# Patient Record
Sex: Male | Born: 1940
Health system: Southern US, Community
[De-identification: ages and names within clinical notes are randomized; demographics above are authoritative.]

## PROBLEM LIST (undated history)

## (undated) DIAGNOSIS — D759 Disease of blood and blood-forming organs, unspecified: Secondary | ICD-10-CM

## (undated) DIAGNOSIS — C801 Malignant (primary) neoplasm, unspecified: Secondary | ICD-10-CM

## (undated) DIAGNOSIS — D689 Coagulation defect, unspecified: Secondary | ICD-10-CM

## (undated) DIAGNOSIS — G709 Myoneural disorder, unspecified: Secondary | ICD-10-CM

## (undated) DIAGNOSIS — I10 Essential (primary) hypertension: Secondary | ICD-10-CM

## (undated) HISTORY — PX: SKIN BIOPSY: SHX1

---

## 2003-02-10 ENCOUNTER — Ambulatory Visit (HOSPITAL_COMMUNITY): Admission: RE | Admit: 2003-02-10 | Discharge: 2003-02-10 | Payer: Self-pay | Admitting: Family Medicine

## 2003-02-10 ENCOUNTER — Encounter: Payer: Self-pay | Admitting: Family Medicine

## 2006-01-10 ENCOUNTER — Ambulatory Visit (HOSPITAL_COMMUNITY): Admission: RE | Admit: 2006-01-10 | Discharge: 2006-01-10 | Payer: Self-pay | Admitting: Family Medicine

## 2006-01-10 ENCOUNTER — Encounter: Payer: Self-pay | Admitting: Vascular Surgery

## 2006-01-10 IMAGING — CR DG CHEST 2V
2 series · 2 of 2 positions shown · non-contrast
Comparison: Report of [DATE].

CLINICAL DATA: Preop chest x-ray/cough/hypertension. 
 CHEST - 2 VIEW:

[view not recorded (1 of 2)]
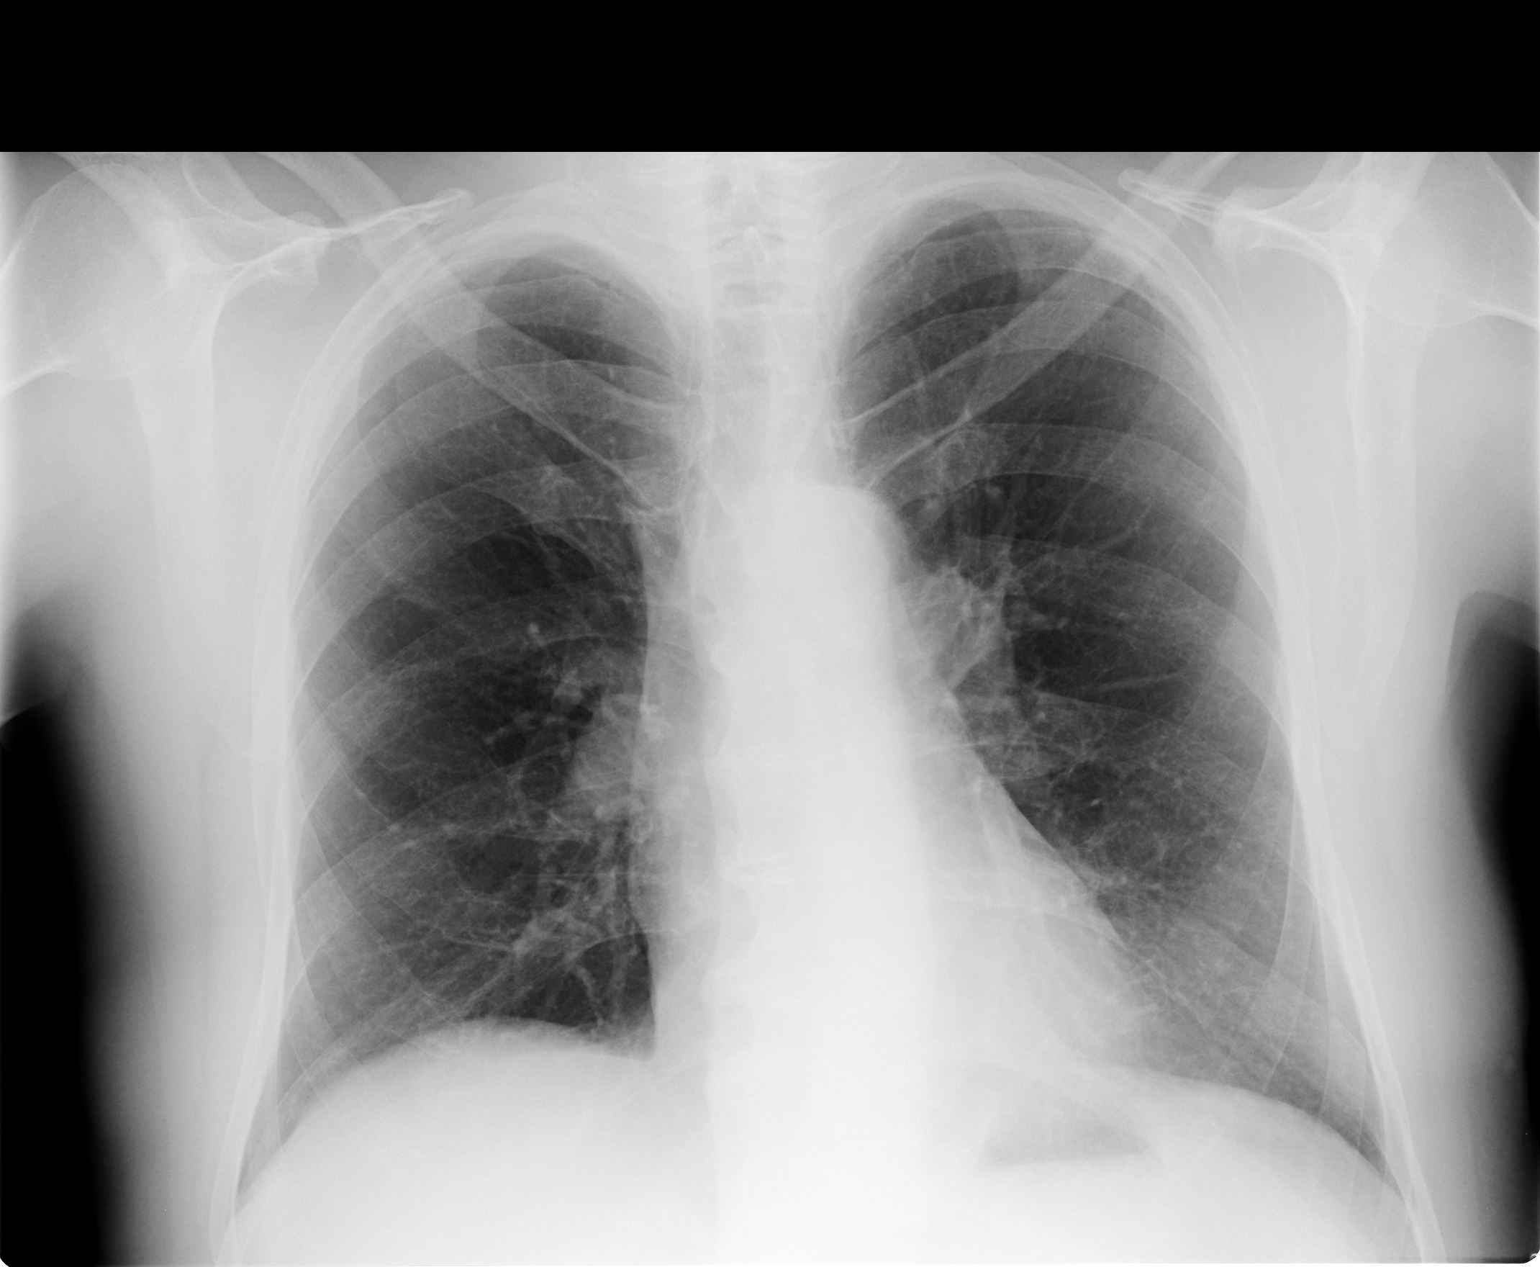

[view not recorded (2 of 2)]
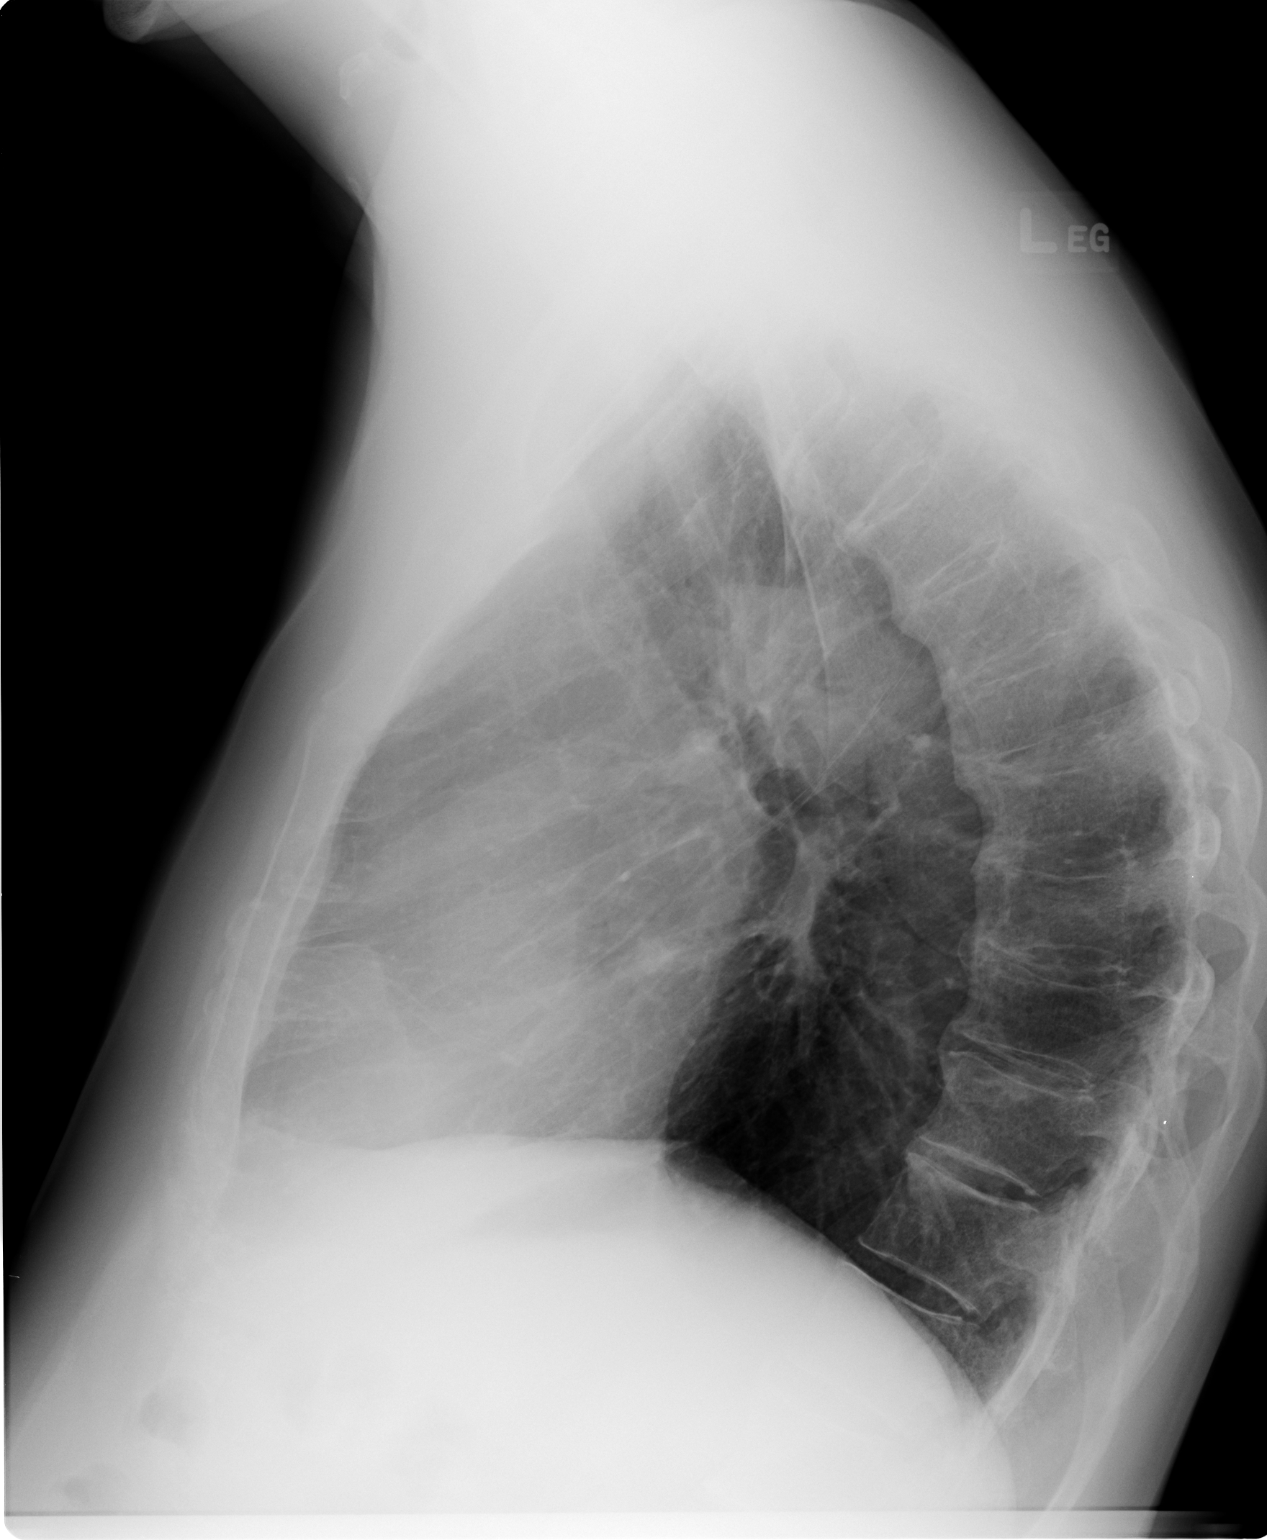

[2 of 2 positions shown; findings below may reference images not displayed]

FINDINGS: Heart size normal.  Lungs mildly hyperaerated but clear.  Slightly accentuated markings.  Degenerative changes of the T-spine noted.
IMPRESSION: Chronic changes ? no active disease.

## 2006-01-12 ENCOUNTER — Emergency Department (HOSPITAL_COMMUNITY): Admission: EM | Admit: 2006-01-12 | Discharge: 2006-01-12 | Payer: Self-pay | Admitting: Emergency Medicine

## 2006-01-13 ENCOUNTER — Emergency Department (HOSPITAL_COMMUNITY): Admission: EM | Admit: 2006-01-13 | Discharge: 2006-01-13 | Payer: Self-pay | Admitting: Emergency Medicine

## 2007-02-21 ENCOUNTER — Ambulatory Visit: Payer: Self-pay | Admitting: Hematology & Oncology

## 2007-03-19 LAB — PROTIME-INR
INR: 3.3 (ref 2.00–3.50)
Protime: 39.6 Seconds — ABNORMAL HIGH (ref 10.6–13.4)

## 2008-02-25 ENCOUNTER — Emergency Department (HOSPITAL_BASED_OUTPATIENT_CLINIC_OR_DEPARTMENT_OTHER): Admission: EM | Admit: 2008-02-25 | Discharge: 2008-02-25 | Payer: Self-pay | Admitting: Emergency Medicine

## 2008-02-25 IMAGING — CR DG TIBIA/FIBULA 2V*R*
4 series · 4 of 4 positions shown · non-contrast
Comparison: None

CLINICAL DATA: Injury.  Abrasions.

RIGHT TIBIA AND FIBULA - 2 VIEW

[t tib/fib ap right (1 of 2)]
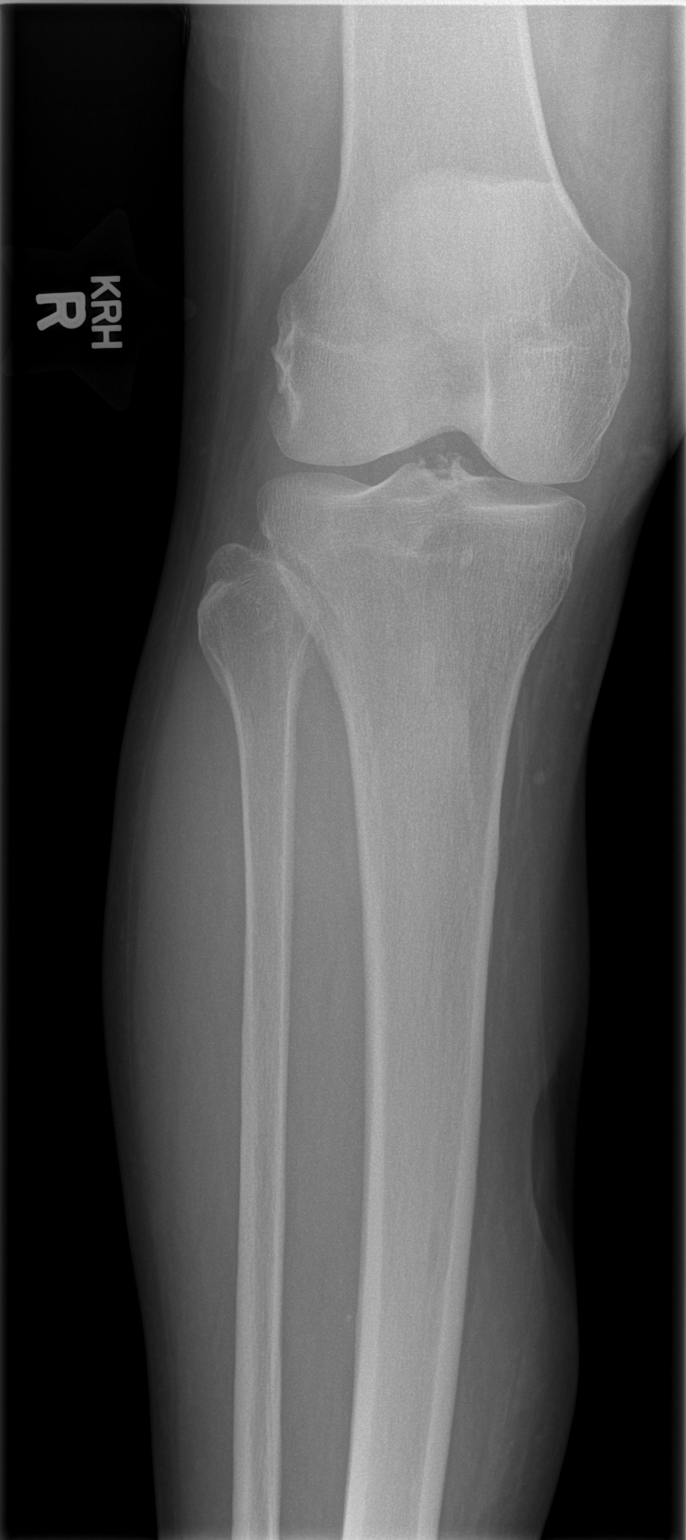

[t tib/fib ap right (2 of 2)]
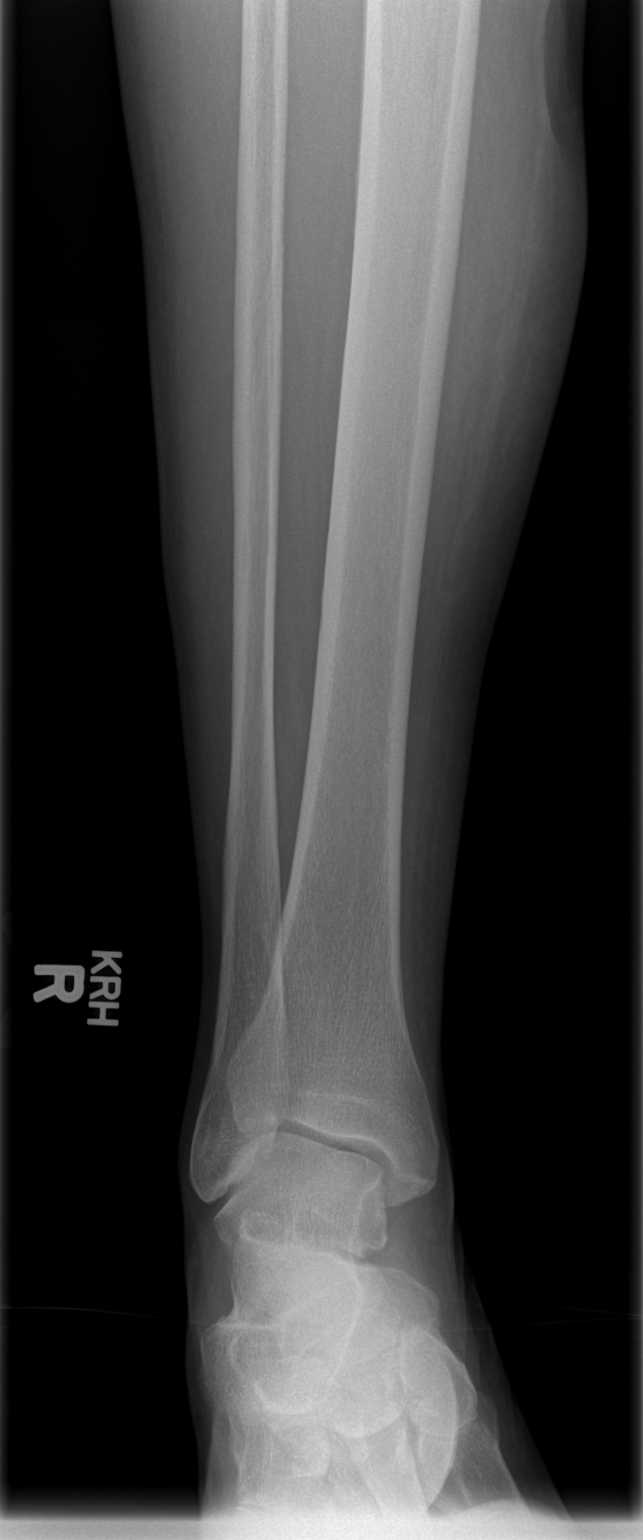

[t tib/fib lat right (1 of 2)]
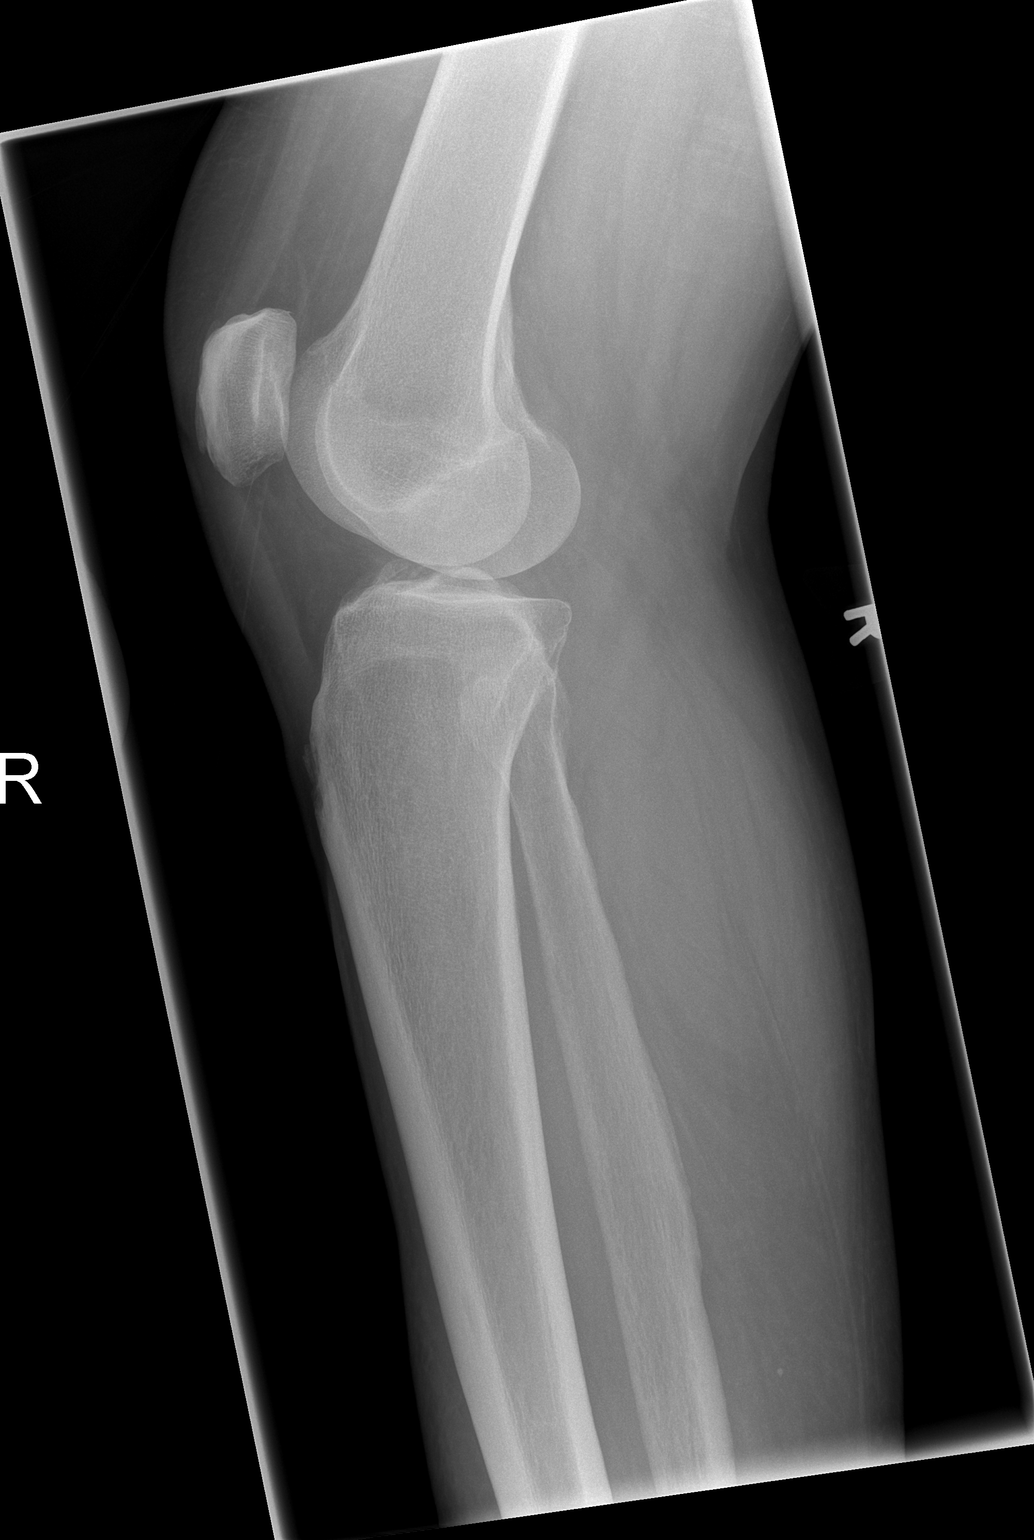

[t tib/fib lat right (2 of 2)]
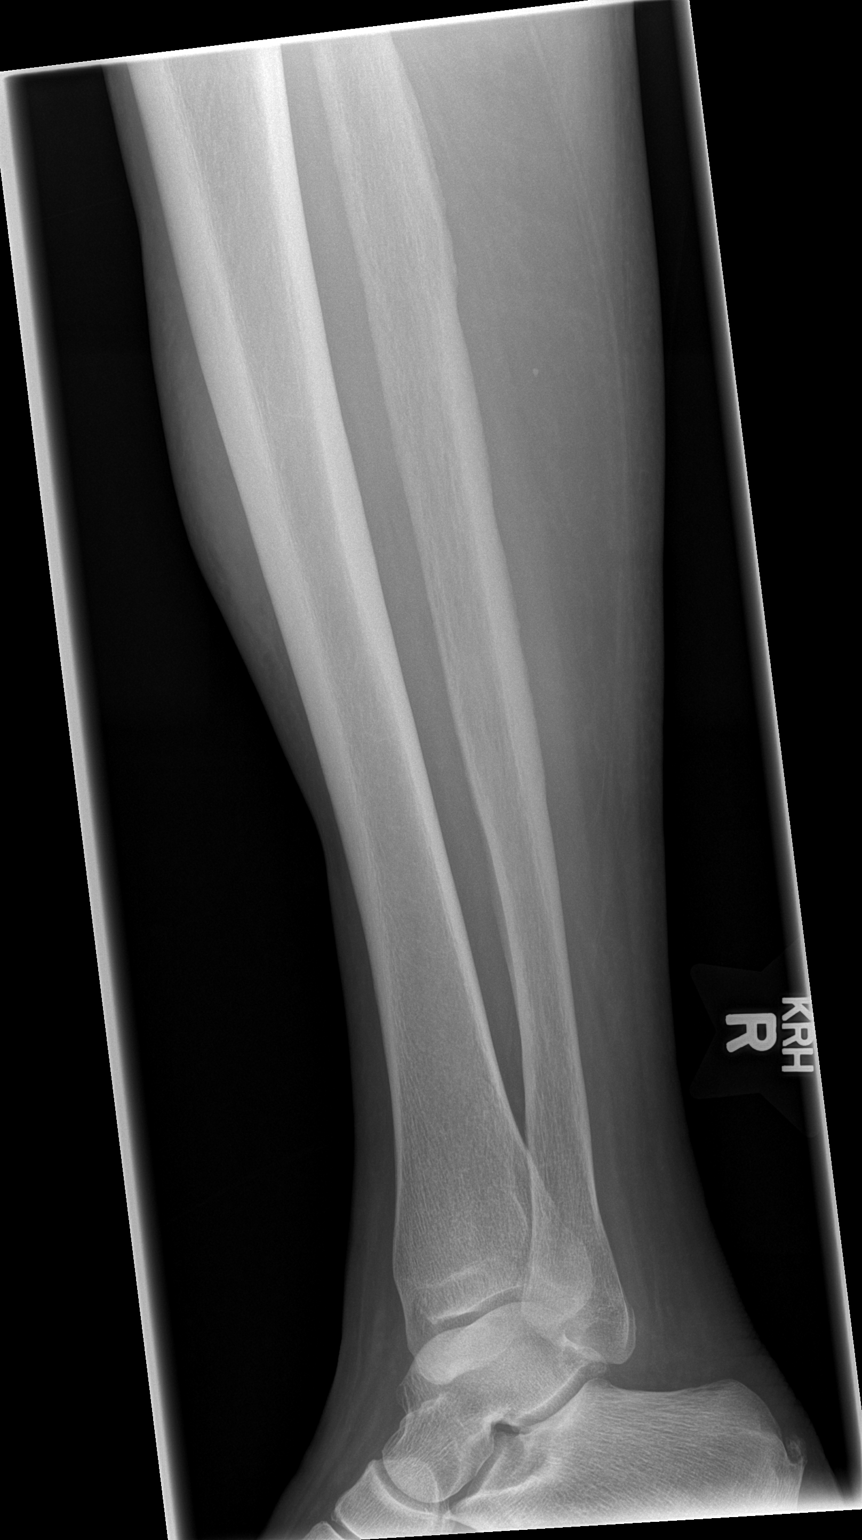

[4 of 4 positions shown; findings below may reference images not displayed]

FINDINGS: No acute fracture.  Suspicion for a small bony loose
bodies just anterior to the tibial eminence.
IMPRESSION: No acute fracture .

## 2008-02-25 IMAGING — CR DG TIBIA/FIBULA 2V*L*
4 series · 4 of 4 positions shown · non-contrast
Comparison: None

CLINICAL DATA: Injury.  Open abrasions.  Swollen anteriorly

LEFT TIBIA AND FIBULA - 2 VIEW

[t tib/fib ap left (1 of 2)]
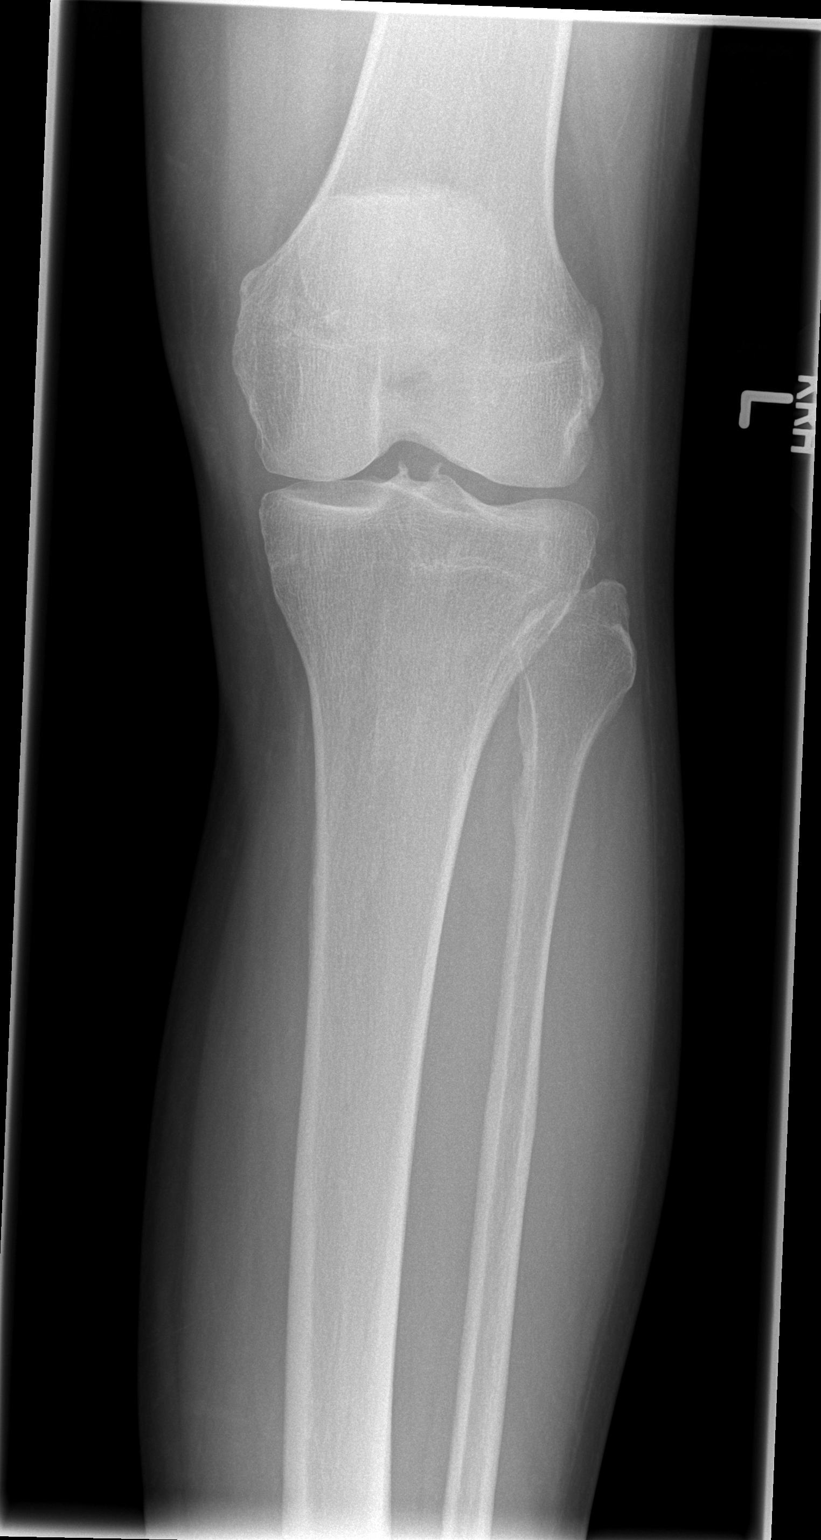

[t tib/fib ap left (2 of 2)]
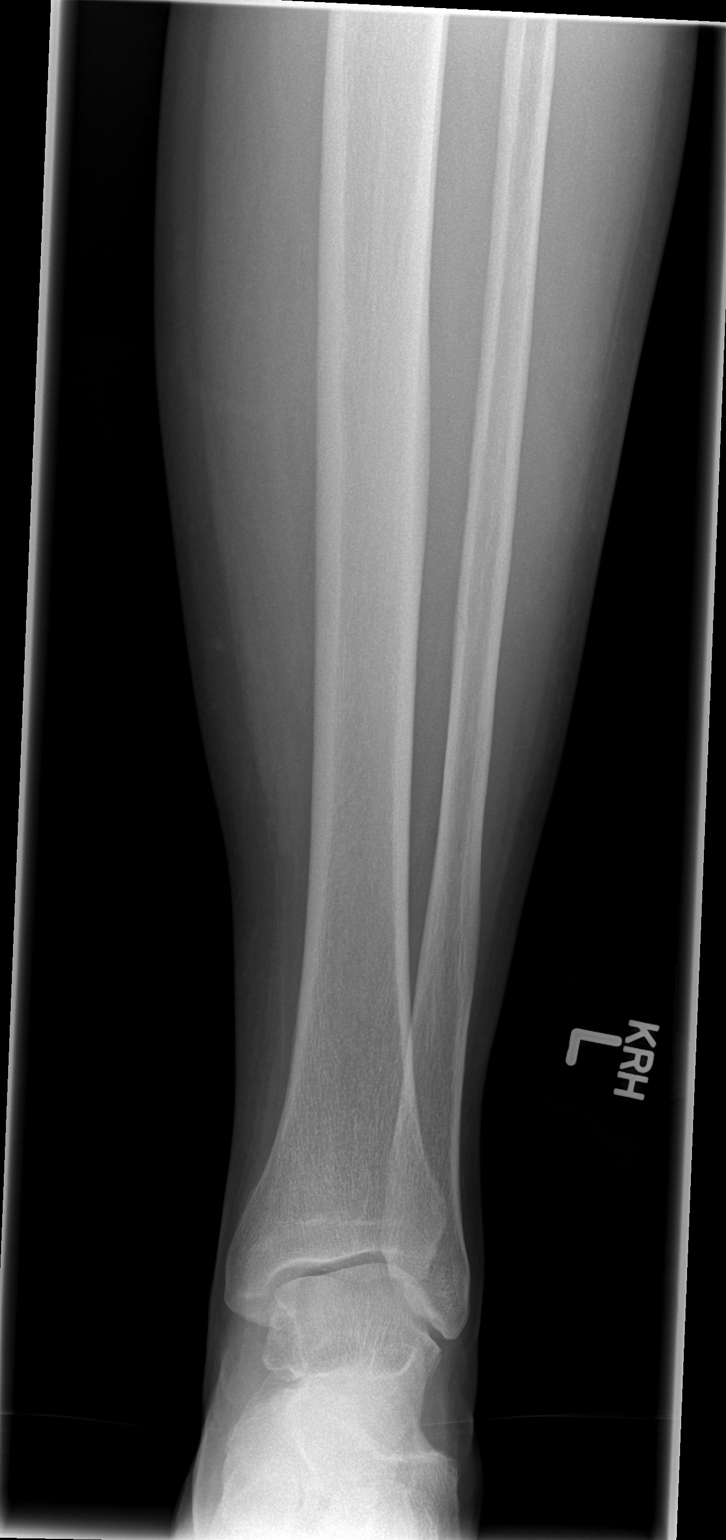

[t tib/fib lat left (1 of 2)]
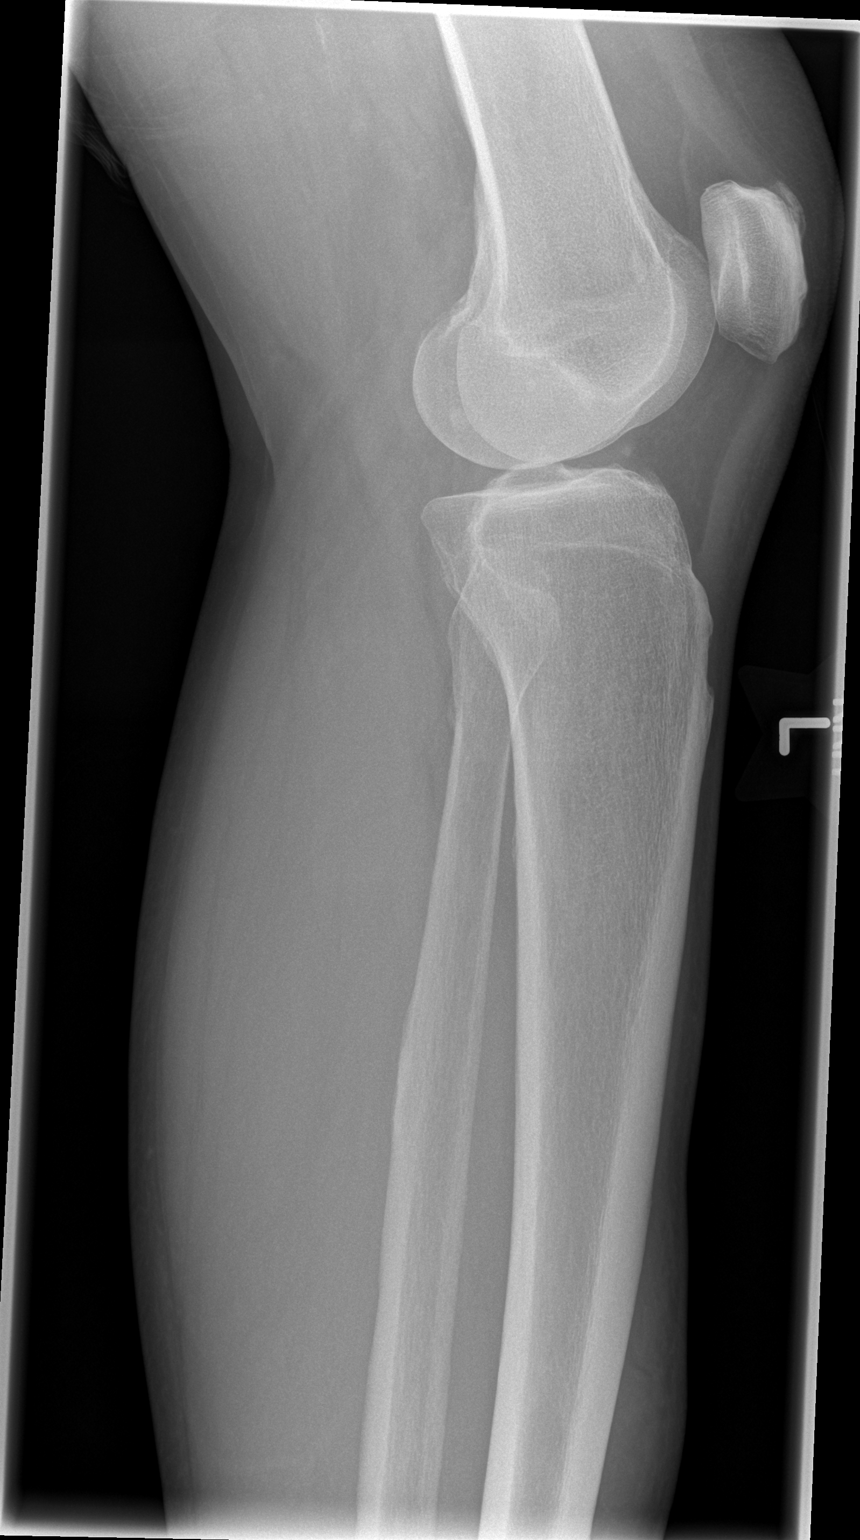

[t tib/fib lat left (2 of 2)]
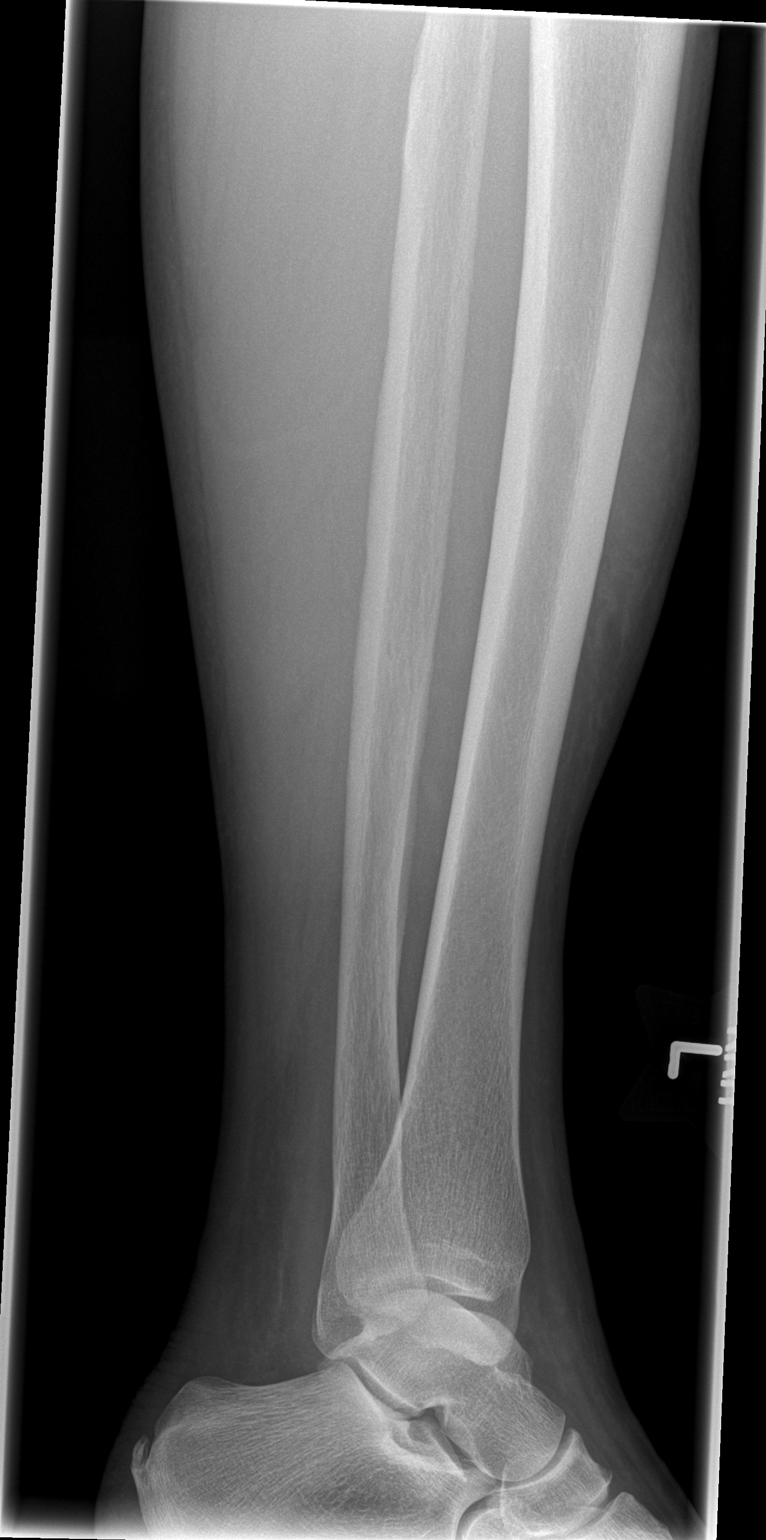

[4 of 4 positions shown; findings below may reference images not displayed]

FINDINGS: No acute osseous findings.  No fracture or bony
displacement.
IMPRESSION: Negative.

## 2011-02-06 LAB — PROTIME-INR
INR: 2.2 — ABNORMAL HIGH
Prothrombin Time: 25.4 — ABNORMAL HIGH

## 2013-07-29 ENCOUNTER — Ambulatory Visit: Payer: Medicare Other | Attending: Family Medicine | Admitting: Physical Therapy

## 2013-07-29 DIAGNOSIS — IMO0001 Reserved for inherently not codable concepts without codable children: Secondary | ICD-10-CM | POA: Insufficient documentation

## 2013-07-29 DIAGNOSIS — R269 Unspecified abnormalities of gait and mobility: Secondary | ICD-10-CM | POA: Insufficient documentation

## 2013-07-29 DIAGNOSIS — M6281 Muscle weakness (generalized): Secondary | ICD-10-CM | POA: Insufficient documentation

## 2013-08-03 ENCOUNTER — Ambulatory Visit: Payer: Medicare Other | Admitting: Rehabilitation

## 2013-08-06 ENCOUNTER — Ambulatory Visit: Payer: Medicare Other | Attending: Family Medicine | Admitting: Physical Therapy

## 2013-08-06 DIAGNOSIS — R269 Unspecified abnormalities of gait and mobility: Secondary | ICD-10-CM | POA: Insufficient documentation

## 2013-08-06 DIAGNOSIS — IMO0001 Reserved for inherently not codable concepts without codable children: Secondary | ICD-10-CM | POA: Insufficient documentation

## 2013-08-06 DIAGNOSIS — M6281 Muscle weakness (generalized): Secondary | ICD-10-CM | POA: Insufficient documentation

## 2013-08-10 ENCOUNTER — Ambulatory Visit: Payer: Medicare Other | Admitting: Rehabilitation

## 2013-08-13 ENCOUNTER — Ambulatory Visit: Payer: Medicare Other | Admitting: Rehabilitation

## 2013-08-17 ENCOUNTER — Ambulatory Visit: Payer: Medicare Other | Admitting: Physical Therapy

## 2013-08-20 ENCOUNTER — Ambulatory Visit: Payer: Medicare Other | Admitting: Physical Therapy

## 2013-08-24 ENCOUNTER — Ambulatory Visit: Payer: Medicare Other | Admitting: Rehabilitation

## 2013-08-27 ENCOUNTER — Ambulatory Visit: Payer: Medicare Other | Admitting: Rehabilitation

## 2013-09-01 ENCOUNTER — Encounter: Payer: Medicare Other | Admitting: Physical Therapy

## 2013-09-03 ENCOUNTER — Ambulatory Visit: Payer: Medicare Other | Admitting: Physical Therapy

## 2013-09-07 ENCOUNTER — Ambulatory Visit: Payer: Medicare Other | Attending: Family Medicine | Admitting: Rehabilitation

## 2013-09-07 DIAGNOSIS — R269 Unspecified abnormalities of gait and mobility: Secondary | ICD-10-CM | POA: Insufficient documentation

## 2013-09-07 DIAGNOSIS — IMO0001 Reserved for inherently not codable concepts without codable children: Secondary | ICD-10-CM | POA: Insufficient documentation

## 2013-09-07 DIAGNOSIS — M6281 Muscle weakness (generalized): Secondary | ICD-10-CM | POA: Insufficient documentation

## 2013-09-09 ENCOUNTER — Ambulatory Visit: Payer: Medicare Other | Admitting: Physical Therapy

## 2013-09-15 ENCOUNTER — Ambulatory Visit: Payer: Medicare Other | Admitting: Physical Therapy

## 2013-09-17 ENCOUNTER — Ambulatory Visit: Payer: Medicare Other | Admitting: Physical Therapy

## 2013-09-21 ENCOUNTER — Ambulatory Visit: Payer: Medicare Other | Admitting: Physical Therapy

## 2013-09-24 ENCOUNTER — Ambulatory Visit: Payer: Medicare Other | Admitting: Physical Therapy

## 2019-08-27 ENCOUNTER — Ambulatory Visit: Payer: Medicare Other

## 2019-09-09 ENCOUNTER — Ambulatory Visit: Payer: Medicare Other | Attending: Family Medicine | Admitting: Physical Therapy

## 2019-09-09 ENCOUNTER — Other Ambulatory Visit: Payer: Self-pay

## 2019-09-09 ENCOUNTER — Encounter: Payer: Self-pay | Admitting: Physical Therapy

## 2019-09-09 DIAGNOSIS — R293 Abnormal posture: Secondary | ICD-10-CM | POA: Diagnosis present

## 2019-09-09 DIAGNOSIS — R262 Difficulty in walking, not elsewhere classified: Secondary | ICD-10-CM | POA: Insufficient documentation

## 2019-09-09 DIAGNOSIS — R2681 Unsteadiness on feet: Secondary | ICD-10-CM | POA: Insufficient documentation

## 2019-09-09 DIAGNOSIS — R2689 Other abnormalities of gait and mobility: Secondary | ICD-10-CM | POA: Insufficient documentation

## 2019-09-09 NOTE — Therapy (Signed)
[facility] [address]  [place], [place], [address] Phone: [phone]   Fax:  [phone]  Physical Therapy Evaluation  Patient Details  Name: John Lambert MRN: [id number] Date of Birth: 1941/01/16 Referring Provider (PT): Harlan Stains, MD   Encounter Date: 09/09/2019  PT End of Session - 09/09/19 1609    Visit Number  1    Number of Visits  9    Date for PT Re-Evaluation  11/04/19    Authorization Type  AARP    PT Start Time  1510    PT Stop Time  1558    PT Time Calculation (min)  48 min    Equipment Utilized During Treatment  Gait belt    Activity Tolerance  Patient tolerated treatment well    Behavior During Therapy  [facility] for tasks assessed/performed       History reviewed. No pertinent past medical history.  History reviewed. No pertinent surgical history.  There were no vitals filed for this visit.   Subjective Assessment - 09/09/19 1512    Subjective  Patient reports difficulty getting up out of a chair and unsteadiness on his feet. This has been going on for several years and has gotten worse with time. Patient notes that he does not exercise and is a "couch potato." Denies falls. Has been using a SPC for the past 6 months. Feels unsteady with walking, getting up out of a chair, stairs, and curbs. Denies dizziness.    Pertinent History  HTN, long term use of anticoagulents, hx PE 2015, kyphosis, h/o malignant melanoma, HLD    Limitations  Standing;Walking;House hold activities    Diagnostic tests  none    Patient Stated Goals  "to be able to walk a little better and not be as unsteady on my feet"    Currently in Pain?  No/denies         [facility] PT Assessment - 09/09/19 1516      Assessment   Medical Diagnosis  Abnormal gait    Referring Provider (PT)  Harlan Stains, MD    Onset Date/Surgical Date  --   several years   Next MD Visit  11/14/19    Prior Therapy  yes- imbalance      Precautions   Precaution Comments  on blood thinners      Balance Screen   Has the patient fallen in the past 6 months  No    Has the patient had a decrease in activity level because of a fear of falling?   No    Is the patient reluctant to leave their home because of a fear of falling?   No      Home Film/video editor residence    Living Arrangements  Spouse/significant other    Available Help at Discharge  Family    Type of [place] to enter    Entrance Stairs-Number of Steps  1    [place]  One level    [place] - 2 wheels;Walker - 4 wheels      Prior Function   Level of Independence  Independent    Vocation  Retired    Leisure  none      Cognition   Overall Cognitive Status  Within Functional Limits for tasks assessed      Sensation  Light Touch  Appears Intact      Coordination   Gross Motor Movements are Fluid and Coordinated  Yes      Posture/Postural Control   Posture/Postural Control  Postural limitations    Postural Limitations  Rounded Shoulders;Forward head;Increased thoracic kyphosis;Flexed trunk      ROM / Strength   AROM / PROM / Strength  Strength      Strength   Strength Assessment Site  Hip;Knee;Ankle    Right/Left Hip  Right;Left    Right Hip Flexion  4+/5    Right Hip ABduction  4+/5    Right Hip ADduction  4+/5    Left Hip Flexion  4/5    Left Hip ABduction  4+/5    Left Hip ADduction  4+/5    Right/Left Knee  Right;Left    Right Knee Flexion  4-/5    Right Knee Extension  5/5    Left Knee Flexion  4/5    Left Knee Extension  5/5    Right/Left Ankle  Right;Left    Right Ankle Dorsiflexion  5/5    Right Ankle Plantar Flexion  4+/5    Left Ankle Dorsiflexion  5/5    Left Ankle Plantar Flexion  4+/5      Ambulation/Gait   Assistive device  Straight cane    Gait Pattern  Decreased step length - right;Decreased step length - left;Decreased  dorsiflexion - right;Decreased dorsiflexion - left;Right foot flat;Left foot flat;Right flexed knee in stance;Left flexed knee in stance;Trunk flexed;Poor foot clearance - right;Poor foot clearance - left    Gait Comments  shuffling steps with knees flexed and inconsistent use of SPC and kyphotic posture throughout      Standardized Balance Assessment   Standardized Balance Assessment  Berg Balance Test;Timed Up and Go Test      Berg Balance Test   Sit to Stand  Able to stand using hands after several tries    Standing Unsupported  Able to stand 2 minutes with supervision    Sitting with Back Unsupported but Feet Supported on Floor or Stool  Able to sit safely and securely 2 minutes    Stand to Sit  Sits safely with minimal use of hands    Transfers  Needs one person to assist    Standing Unsupported with Eyes Closed  Needs help to keep from falling    Standing Unsupported with Feet Together  Needs help to attain position and unable to hold for 15 seconds    From Standing, Reach Forward with Outstretched Arm  Loses balance while trying/requires external support    From Standing Position, Pick up Object from Floor  Unable to try/needs assist to keep balance    From Standing Position, Turn to Look Behind Over each Shoulder  Turn sideways only but maintains balance    Turn 360 Degrees  Needs assistance while turning    Standing Unsupported, Alternately Place Feet on Step/Stool  Needs assistance to keep from falling or unable to try    Standing Unsupported, One Foot in [employer] balance while stepping or standing    Standing on One Leg  Unable to try or needs assist to prevent fall    Total Score  16      Timed Up and Go Test   Normal TUG (seconds)  26.21   with SPC               Objective measurements completed on examination: See above findings.  PT Education - 09/09/19 1608    Education Details  prognosis, POC, HEP; edu on proper and consistent use of  SPC for safety    Person(s) Educated  Patient    Methods  Explanation;Demonstration;Tactile cues;Verbal cues;Handout    Comprehension  Verbalized understanding;Returned demonstration       PT Short Term Goals - 09/09/19 1612      PT SHORT TERM GOAL #1   Title  Patient to be independent with initial HEP.    Time  4    Period  Weeks    Status  New    Target Date  10/07/19        PT Long Term Goals - 09/09/19 1612      PT LONG TERM GOAL #1   Title  Patient to be independent with advanced HEP.    Time  8    Period  Weeks    Status  New    Target Date  11/04/19      PT LONG TERM GOAL #2   Title  Patient to demonstrate consistent AD use during with ambulation for improved safety.    Time  8    Period  Weeks    Status  New    Target Date  11/04/19      PT LONG TERM GOAL #3   Title  Patient to improve Berg score by 15 points in order to decrease risk of falls.    Baseline  09/08/19: 16/56    Time  8    Period  Weeks    Status  New    Target Date  11/04/19      PT LONG TERM GOAL #4   Title  Patient to decrease TUG time with LRAD by 10 seconds in order to decrease risk of falls.    Baseline  26.21 sec with SPC    Time  8    Period  Weeks    Status  New    Target Date  11/04/19      PT LONG TERM GOAL #5   Title  Patient to report compliance with light fitness regimen 3x/week for continued activity tolerance.    Time  8    Period  Weeks    Status  New    Target Date  11/04/19             Plan - 09/09/19 1610    Clinical Impression Statement  Patient is an 79y/o M presenting to OPPT with c/o imbalance and difficulty walking of several years duration. Denies falls and has been using [facility] for the past 6 months. Notes most unsteadiness occurs with walking, getting up out of a chair, stairs, and curbs. Denies dizziness. Patient today presenting with severely rounded and kyphotic posture, decreased B HS strength, gait deviations, and difficulty with  transfers/transitions and initiation of movement. Patient's score on Berg and TUG indicate an increased risk of falls. Patient nearly falling several times during assessment today d/t instability with standing from sitting, with tendency to place posteriorly onto heels. Better performance when cueing patient to "shift belly forward."Patient educated on STS and standing balance HEP, with direct instruction ot perform at a counter top for safety. Patient reported understanding. Would benefit from skilled PT services 1x/week for 8 weeks to address aforementioned impairments.    Personal Factors and Comorbidities  Age;Comorbidity 3+;Fitness;Past/Current Experience;Time since onset of injury/illness/exacerbation    Comorbidities  HTN, long term use of anticoagulents, hx PE 2015, kyphosis, h/o malignant melanoma,  HLD    Examination-Activity Limitations  Bend;Squat;Stairs;Carry;Stand;Toileting;Dressing;Transfers;Hygiene/Grooming;Lift;Locomotion Level;Reach Overhead    Examination-Participation Restrictions  Church;Cleaning;Shop;Community Activity;Yard Work;Interpersonal Relationship;Laundry;Meal Prep    Stability/Clinical Decision Making  Stable/Uncomplicated    Clinical Decision Making  Low    Rehab Potential  Good    PT Frequency  1x / week    PT Duration  8 weeks    PT Treatment/Interventions  ADLs/Self Care Home Management;Cryotherapy;Electrical Stimulation;Moist Heat;Balance training;Therapeutic exercise;Therapeutic activities;Functional mobility training;Stair training;Gait training;DME Instruction;Ultrasound;Neuromuscular re-education;Patient/family education;Manual techniques;Taping;Energy conservation;Dry needling;Passive range of motion    PT Next Visit Plan  reassess HEP; gait training with SPC/RW, STS training    Consulted and Agree with Plan of Care  Patient       Patient will benefit from skilled therapeutic intervention in order to improve the following deficits and impairments:  Abnormal  gait, Decreased activity tolerance, Decreased strength, Decreased balance, Decreased mobility, Difficulty walking, Improper body mechanics, Decreased range of motion, Impaired flexibility, Postural dysfunction  Visit Diagnosis: Unsteadiness on feet  Other abnormalities of gait and mobility  Abnormal posture  Difficulty in walking, not elsewhere classified     Problem List There are no problems to display for this patient.     Janene Harvey, PT, DPT 09/09/19 5:20 PM   [facility] [address]  [place], [place], [address] Phone: [phone]   Fax:  [phone]  Name: KEVN TEATER MRN: [id number] Date of Birth: [birth year]

## 2019-09-16 ENCOUNTER — Ambulatory Visit: Payer: Medicare Other

## 2019-09-23 ENCOUNTER — Ambulatory Visit: Payer: Medicare Other

## 2019-09-23 ENCOUNTER — Other Ambulatory Visit: Payer: Self-pay

## 2019-09-23 DIAGNOSIS — R2681 Unsteadiness on feet: Secondary | ICD-10-CM | POA: Diagnosis not present

## 2019-09-23 DIAGNOSIS — R293 Abnormal posture: Secondary | ICD-10-CM

## 2019-09-23 DIAGNOSIS — R262 Difficulty in walking, not elsewhere classified: Secondary | ICD-10-CM

## 2019-09-23 DIAGNOSIS — R2689 Other abnormalities of gait and mobility: Secondary | ICD-10-CM

## 2019-09-23 NOTE — Therapy (Signed)
[facility] [address]  [place], [place], [address] Phone: [phone]   Fax:  [phone]  Physical Therapy Treatment  Patient Details  Name: John Lambert MRN: [id number] Date of Birth: 1940-12-28 Referring Provider (PT): Harlan Stains, MD   Encounter Date: 09/23/2019  PT End of Session - 09/23/19 1451    Visit Number  2    Number of Visits  9    Date for PT Re-Evaluation  11/04/19    Authorization Type  AARP    PT Start Time  [id number]    PT Stop Time  1528    PT Time Calculation (min)  43 min    Equipment Utilized During Treatment  Gait belt    Activity Tolerance  Patient tolerated treatment well    Behavior During Therapy  [facility] for tasks assessed/performed       No past medical history on file.  No past surgical history on file.  There were no vitals filed for this visit.  Subjective Assessment - 09/23/19 1450    Subjective  John Lambert doing well today.    Pertinent History  HTN, long term use of anticoagulents, hx PE 2015, kyphosis, h/o malignant melanoma, HLD    Diagnostic tests  none    Patient Stated Goals  "to be able to walk a little better and not be as unsteady on my feet"    Currently in Pain?  No/denies    Multiple Pain Sites  No                        [facility] Adult PT Treatment/Exercise - 09/23/19 0001      Transfers   Transfers  Sit to Stand;Stand to Sit    Sit to Stand  5: Supervision    Sit to Stand Details  Verbal cues for sequencing;Verbal cues for precautions/safety    Sit to Stand Details (indicate cue type and reason)  cues for proper hand placement     Stand to Sit  5: Supervision    Stand to Sit Details (indicate cue type and reason)  Verbal cues for sequencing;Verbal cues for precautions/safety    Stand to Sit Details  Cues for proper hand placement       Knee/Hip Exercises: Aerobic   Nustep  Lvl 3, 6 min (UE/LE)      Knee/Hip Exercises: Standing   Heel Raises   Both;15 reps    Heel Raises Limitations  in RW heel/toe raise     Knee Flexion  Right;Left;10 reps;Strengthening    Knee Flexion Limitations  in RW    Hip Flexion  Right;Left;10 reps;Knee bent    Hip Flexion Limitations  LE cleranace to 8" step    Other Standing Knee Exercises  R/L weight shift standing in RW with cueing for increased R weight shift x 10       Knee/Hip Exercises: Seated   Sit to Sand  10 reps   from standard height chair with B UE pushoff from rails              PT Short Term Goals - 09/23/19 1451      PT SHORT TERM GOAL #1   Title  Patient to be independent with initial HEP.    Time  4    Period  Weeks    Status  On-going    Target Date  10/07/19        PT Long  Term Goals - 09/23/19 1451      PT LONG TERM GOAL #1   Title  Patient to be independent with advanced HEP.    Time  8    Period  Weeks    Status  On-going      PT LONG TERM GOAL #2   Title  Patient to demonstrate consistent AD use during with ambulation for improved safety.    Time  8    Period  Weeks    Status  On-going      PT LONG TERM GOAL #3   Title  Patient to improve Berg score by 15 points in order to decrease risk of falls.    Baseline  09/08/19: 16/56    Time  8    Period  Weeks    Status  On-going      PT LONG TERM GOAL #4   Title  Patient to decrease TUG time with LRAD by 10 seconds in order to decrease risk of falls.    Baseline  26.21 sec with SPC    Time  8    Period  Weeks    Status  On-going      PT LONG TERM GOAL #5   Title  Patient to report compliance with light fitness regimen 3x/week for continued activity tolerance.    Time  8    Period  Weeks    Status  On-going            Plan - 09/23/19 1452    Clinical Impression Statement  Pt. doing well today with no new complaints.  Notes good adherence to HEP.  Seen initially to start session ambulating into clinic with [facility] and shortened shuffling gait pattern with poor LE clearance.  Pt. with overt LOB  x 2 using wall for support intermittently throughout PT session.  Gait training with RW revealing much improved gait speed and stability with decreased fall risk.  Pt. did require cueing for proper hand positioning to avoid pulling up on RW with sit>stand transfers x 3 however with improved carryover after instruction.  Pt. verbalizing plans to bring his rollator into next PT session for proper sizing and further gait training.  With good overall technique throughout HEP performance today.  Will continue to benefit from further skilled gait training, transfer training for improved safety with AD.    Comorbidities  HTN, long term use of anticoagulents, hx PE 2015, kyphosis, h/o malignant melanoma, HLD    Rehab Potential  Good    PT Frequency  1x / week    PT Treatment/Interventions  ADLs/Self Care Home Management;Cryotherapy;Electrical Stimulation;Moist Heat;Balance training;Therapeutic exercise;Therapeutic activities;Functional mobility training;Stair training;Gait training;DME Instruction;Ultrasound;Neuromuscular re-education;Patient/family education;Manual techniques;Taping;Energy conservation;Dry needling;Passive range of motion    PT Next Visit Plan  Gait; training with SPC/RW, STS training    Consulted and Agree with Plan of Care  Patient       Patient will benefit from skilled therapeutic intervention in order to improve the following deficits and impairments:  Abnormal gait, Decreased activity tolerance, Decreased strength, Decreased balance, Decreased mobility, Difficulty walking, Improper body mechanics, Decreased range of motion, Impaired flexibility, Postural dysfunction  Visit Diagnosis: Unsteadiness on feet  Other abnormalities of gait and mobility  Abnormal posture  Difficulty in walking, not elsewhere classified     Problem List There are no problems to display for this patient.   [name], PTA 09/23/19 8:11 PM   [facility] [address]  [place], [place], [address] Phone: [phone]   Fax:  [phone]  Name: KANARD FORS MRN: [id number] Date of Birth: [birth year]

## 2019-09-30 ENCOUNTER — Other Ambulatory Visit: Payer: Self-pay

## 2019-09-30 ENCOUNTER — Ambulatory Visit: Payer: Medicare Other

## 2019-09-30 DIAGNOSIS — R2681 Unsteadiness on feet: Secondary | ICD-10-CM | POA: Diagnosis not present

## 2019-09-30 DIAGNOSIS — R262 Difficulty in walking, not elsewhere classified: Secondary | ICD-10-CM

## 2019-09-30 DIAGNOSIS — R293 Abnormal posture: Secondary | ICD-10-CM

## 2019-09-30 DIAGNOSIS — R2689 Other abnormalities of gait and mobility: Secondary | ICD-10-CM

## 2019-09-30 NOTE — Therapy (Signed)
[facility] [address]  [place], [place], [address] Phone: [phone]   Fax:  [phone]  Physical Therapy Treatment  Patient Details  Name: John Lambert MRN: [id number] Date of Birth: December 02, 1940 Referring Provider (PT): Harlan Stains, MD   Encounter Date: 09/30/2019  PT End of Session - 09/30/19 1452    Visit Number  3    Number of Visits  9    Date for PT Re-Evaluation  11/04/19    Authorization Type  AARP    PT Start Time  1448    PT Stop Time  1528    PT Time Calculation (min)  40 min    Equipment Utilized During Treatment  Gait belt    Activity Tolerance  Patient tolerated treatment well    Behavior During Therapy  [facility] for tasks assessed/performed       No past medical history on file.  No past surgical history on file.  There were no vitals filed for this visit.  Subjective Assessment - 09/30/19 1451    Subjective  Pt. with no new complaints seen with rollator and noting he has been ambulating with this device at home.    Pertinent History  HTN, long term use of anticoagulents, hx PE 2015, kyphosis, h/o malignant melanoma, HLD    Diagnostic tests  none    Patient Stated Goals  "to be able to walk a little better and not be as unsteady on my feet"    Currently in Pain?  No/denies    Multiple Pain Sites  No                        [facility] Adult PT Treatment/Exercise - 09/30/19 0001      Transfers   Transfers  Sit to Stand;Stand to Sit    Sit to Stand  5: Supervision    Sit to Stand Details  Verbal cues for sequencing;Verbal cues for precautions/safety    Sit to Stand Details (indicate cue type and reason)  cued pt. for safe hand placement to control motion     Stand to Sit  5: Supervision    Stand to Sit Details (indicate cue type and reason)  Verbal cues for sequencing;Verbal cues for precautions/safety    Stand to Sit Details  cueing for pt. to reach back to chair and lower  himself under control       Neck Exercises: Standing   Other Standing Exercises  standing scapular retraction leaning on wall 5" x 10  - with therapist manual tc cueing for retraction pushing posterior on shoulders       Lumbar Exercises: Stretches   Passive Hamstring Stretch  Right;Left;2 reps;30 seconds    Passive Hamstring Stretch Limitations  seated with 6" step heel prop      Lumbar Exercises: Seated   Other Seated Lumbar Exercises  red TB row x 10 seated in chair + airex pad       Knee/Hip Exercises: Aerobic   Nustep  Lvl 3, 6 min (UE/LE)      Knee/Hip Exercises: Standing   Heel Raises  Both;15 reps    Heel Raises Limitations  counter     Knee Flexion  Right;Left;10 reps;Strengthening    Knee Flexion Limitations  in RW    Hip Abduction  Right;Left;10 reps;Knee straight    Abduction Limitations  standing counter     Other Standing Knee Exercises  side stepping at  counter x 2 laps - cues for upright posture       Knee/Hip Exercises: Seated   Sit to Sand  5 reps;with UE support   from chair with airex pad; 1 hand pushoff             PT Education - 09/30/19 1535    Education Details  HEP update;  standing HS curl at counter (substituted for seated band curl which pt. has had difficulty setting up).    Person(s) Educated  Patient    Methods  Explanation;Demonstration;Verbal cues;Handout    Comprehension  Verbalized understanding;Returned demonstration;Verbal cues required       PT Short Term Goals - 09/23/19 1451      PT SHORT TERM GOAL #1   Title  Patient to be independent with initial HEP.    Time  4    Period  Weeks    Status  On-going    Target Date  10/07/19        PT Long Term Goals - 09/23/19 1451      PT LONG TERM GOAL #1   Title  Patient to be independent with advanced HEP.    Time  8    Period  Weeks    Status  On-going      PT LONG TERM GOAL #2   Title  Patient to demonstrate consistent AD use during with ambulation for improved safety.     Time  8    Period  Weeks    Status  On-going      PT LONG TERM GOAL #3   Title  Patient to improve Berg score by 15 points in order to decrease risk of falls.    Baseline  09/08/19: 16/56    Time  8    Period  Weeks    Status  On-going      PT LONG TERM GOAL #4   Title  Patient to decrease TUG time with LRAD by 10 seconds in order to decrease risk of falls.    Baseline  26.21 sec with SPC    Time  8    Period  Weeks    Status  On-going      PT LONG TERM GOAL #5   Title  Patient to report compliance with light fitness regimen 3x/week for continued activity tolerance.    Time  8    Period  Weeks    Status  On-going            Plan - 09/30/19 1506    Clinical Impression Statement  John Lambert doing well without new complaints.  Reports he continues performing all HEP activities without issue with exception of seated HS curl with band resistance which has been difficult for him to setup.  Provided alternative HS curl in standing at counter with RW which John Lambert liked thus updated HEP with this activity.  Progressed LE strengthening with counter support which was tolerated well.  John Lambert seen with rollator which was slightly low for him however was able to demonstrate much improved stability and step-length/gait speed with rollator.  Ended visit with pt. noting LE fatigue and leaving session with HEP handout for standing HS curl.    Comorbidities  HTN, long term use of anticoagulents, hx PE 2015, kyphosis, h/o malignant melanoma, HLD    Rehab Potential  Good    PT Frequency  1x / week    PT Treatment/Interventions  ADLs/Self Care Home Management;Cryotherapy;Electrical Stimulation;Moist Heat;Balance training;Therapeutic exercise;Therapeutic activities;Functional mobility training;Stair training;Gait  training;DME Instruction;Ultrasound;Neuromuscular re-education;Patient/family education;Manual techniques;Taping;Energy conservation;Dry needling;Passive range of motion    PT Next Visit Plan  Gait  training RW, STS training for proper hand placement    Consulted and Agree with Plan of Care  Patient       Patient will benefit from skilled therapeutic intervention in order to improve the following deficits and impairments:  Abnormal gait, Decreased activity tolerance, Decreased strength, Decreased balance, Decreased mobility, Difficulty walking, Improper body mechanics, Decreased range of motion, Impaired flexibility, Postural dysfunction  Visit Diagnosis: Unsteadiness on feet  Other abnormalities of gait and mobility  Abnormal posture  Difficulty in walking, not elsewhere classified     Problem List There are no problems to display for this patient.   [name], PTA 09/30/19 3:41 PM   [facility] [address]  [place], [place], [address] Phone: [phone]   Fax:  [phone]  Name: John Lambert MRN: [id number] Date of Birth: Jan 29, 1941

## 2019-10-07 ENCOUNTER — Encounter: Payer: Medicare Other | Admitting: Physical Therapy

## 2019-10-14 ENCOUNTER — Ambulatory Visit: Payer: Medicare Other | Attending: Family Medicine

## 2019-10-14 ENCOUNTER — Other Ambulatory Visit: Payer: Self-pay

## 2019-10-14 DIAGNOSIS — R2689 Other abnormalities of gait and mobility: Secondary | ICD-10-CM | POA: Insufficient documentation

## 2019-10-14 DIAGNOSIS — R293 Abnormal posture: Secondary | ICD-10-CM | POA: Diagnosis present

## 2019-10-14 DIAGNOSIS — R262 Difficulty in walking, not elsewhere classified: Secondary | ICD-10-CM | POA: Insufficient documentation

## 2019-10-14 DIAGNOSIS — R2681 Unsteadiness on feet: Secondary | ICD-10-CM | POA: Insufficient documentation

## 2019-10-14 NOTE — Therapy (Signed)
[facility] [address]  [place], [place], [address] Phone: [phone]   Fax:  [phone]  Physical Therapy Treatment  Patient Details  Name: John Lambert MRN: [id number] Date of Birth: [birth year] Referring Provider (PT): Harlan Stains, MD   Encounter Date: 10/14/2019  PT End of Session - 10/14/19 1501    Visit Number  4    Number of Visits  9    Date for PT Re-Evaluation  11/04/19    Authorization Type  AARP    PT Start Time  1450    PT Stop Time  1528    PT Time Calculation (min)  38 min    Equipment Utilized During Treatment  Gait belt    Activity Tolerance  Patient tolerated treatment well    Behavior During Therapy  [facility] for tasks assessed/performed       No past medical history on file.  No past surgical history on file.  There were no vitals filed for this visit.  Subjective Assessment - 10/14/19 1500    Subjective  Pt. doing ok.  Notes he has been ambulating at home with [facility] and rollator out of the home.    Pertinent History  HTN, long term use of anticoagulents, hx PE 2015, kyphosis, h/o malignant melanoma, HLD    Diagnostic tests  none    Patient Stated Goals  "to be able to walk a little better and not be as unsteady on my feet"    Currently in Pain?  No/denies    Multiple Pain Sites  No                        [facility] Adult PT Treatment/Exercise - 10/14/19 0001      Transfers   Transfers  Sit to Stand;Stand to Sit    Sit to Stand  5: Supervision    Sit to Stand Details  Verbal cues for sequencing;Verbal cues for precautions/safety    Sit to Stand Details (indicate cue type and reason)  had to continue cueing pt. for proper hand placement     Stand to Sit  5: Supervision    Stand to Sit Details (indicate cue type and reason)  Verbal cues for sequencing;Verbal cues for precautions/safety    Stand to Sit Details  cued pt. for proper hand placement       Ambulation/Gait   Ambulation/Gait  Yes    Ambulation/Gait Assistance  5: Supervision    Ambulation/Gait Assistance Details  gait training with rollator - cued pt. for increased step-length and upright posture     Ambulation Distance (Feet)  90 Feet    Assistive device  Rollator      Lumbar Exercises: Stretches   Passive Hamstring Stretch  Right;Left;30 seconds;1 rep    Passive Hamstring Stretch Limitations  seated with 9" step      Knee/Hip Exercises: Aerobic   Nustep  Lvl 3, 7 min (UE/LE)      Knee/Hip Exercises: Standing   Heel Raises  Both;15 reps    Heel Raises Limitations  heel/toe raise     Knee Flexion  Right;Left;10 reps    Knee Flexion Limitations  2# at counter     Hip Abduction  Right;Left;10 reps;Knee straight    Abduction Limitations  2#; at counter       Knee/Hip Exercises: Seated   Sit to Sand  5 reps;with UE support   from chair + airex pad  PT Short Term Goals - 10/14/19 1501      PT SHORT TERM GOAL #1   Title  Patient to be independent with initial HEP.    Time  4    Period  Weeks    Status  Achieved    Target Date  10/07/19        PT Long Term Goals - 09/23/19 1451      PT LONG TERM GOAL #1   Title  Patient to be independent with advanced HEP.    Time  8    Period  Weeks    Status  On-going      PT LONG TERM GOAL #2   Title  Patient to demonstrate consistent AD use during with ambulation for improved safety.    Time  8    Period  Weeks    Status  On-going      PT LONG TERM GOAL #3   Title  Patient to improve Berg score by 15 points in order to decrease risk of falls.    Baseline  09/08/19: 16/56    Time  8    Period  Weeks    Status  On-going      PT LONG TERM GOAL #4   Title  Patient to decrease TUG time with LRAD by 10 seconds in order to decrease risk of falls.    Baseline  26.21 sec with SPC    Time  8    Period  Weeks    Status  On-going      PT LONG TERM GOAL #5   Title  Patient to report compliance with light fitness  regimen 3x/week for continued activity tolerance.    Time  8    Period  Weeks    Status  On-going            Plan - 10/14/19 1508    Clinical Impression Statement  John Lambert seen initially in session with rollator with forward flexed posture, shortened step-length and with poor hand placement with sit<>stand transfers.  Required heavy cueing with transfers for increased safety with rollator and gait training for upright posture and increased step-length with good carryover in session however pt. admitting that he does forget and pull up on the walking when he stands from chair at home.  Duration of session focused on LE strengthening to pt. tolerance.  Pt. noting LE fatigue to end session however remained pain free.    Comorbidities  HTN, long term use of anticoagulents, hx PE 2015, kyphosis, h/o malignant melanoma, HLD    Rehab Potential  Good    PT Frequency  1x / week    PT Treatment/Interventions  ADLs/Self Care Home Management;Cryotherapy;Electrical Stimulation;Moist Heat;Balance training;Therapeutic exercise;Therapeutic activities;Functional mobility training;Stair training;Gait training;DME Instruction;Ultrasound;Neuromuscular re-education;Patient/family education;Manual techniques;Taping;Energy conservation;Dry needling;Passive range of motion    PT Next Visit Plan  Gait training RW, STS training for proper hand placement    Consulted and Agree with Plan of Care  Patient       Patient will benefit from skilled therapeutic intervention in order to improve the following deficits and impairments:  Abnormal gait, Decreased activity tolerance, Decreased strength, Decreased balance, Decreased mobility, Difficulty walking, Improper body mechanics, Decreased range of motion, Impaired flexibility, Postural dysfunction  Visit Diagnosis: Unsteadiness on feet  Other abnormalities of gait and mobility  Abnormal posture  Difficulty in walking, not elsewhere classified     Problem List There  are no problems to display for this patient.   Zeev Deakins  [place], [place] 10/14/19 6:08 PM   [facility] [address]  [place], [place], [address] Phone: [phone]   Fax:  [phone]  Name: John Lambert MRN: [id number] Date of Birth: 1941-02-08

## 2019-10-21 ENCOUNTER — Other Ambulatory Visit: Payer: Self-pay

## 2019-10-21 ENCOUNTER — Ambulatory Visit: Payer: Medicare Other | Admitting: Physical Therapy

## 2019-10-21 ENCOUNTER — Encounter: Payer: Self-pay | Admitting: Physical Therapy

## 2019-10-21 DIAGNOSIS — R2681 Unsteadiness on feet: Secondary | ICD-10-CM | POA: Diagnosis not present

## 2019-10-21 DIAGNOSIS — R262 Difficulty in walking, not elsewhere classified: Secondary | ICD-10-CM

## 2019-10-21 DIAGNOSIS — R2689 Other abnormalities of gait and mobility: Secondary | ICD-10-CM

## 2019-10-21 DIAGNOSIS — R293 Abnormal posture: Secondary | ICD-10-CM

## 2019-10-21 NOTE — Therapy (Signed)
[facility] [address]  [place], [place], [address] Phone: [phone]   Fax:  [phone]  Physical Therapy Treatment  Patient Details  Name: John Lambert MRN: [id number] Date of Birth: Oct 05, 1940 Referring Provider (PT): Harlan Stains, MD   Encounter Date: 10/21/2019   PT End of Session - 10/21/19 1517    Visit Number 5    Number of Visits 9    Date for PT Re-Evaluation 11/04/19    Authorization Type AARP    PT Start Time 4315    PT Stop Time 1515    PT Time Calculation (min) 41 min    Equipment Utilized During Treatment Gait belt    Activity Tolerance Patient tolerated treatment well    Behavior During Therapy [facility] for tasks assessed/performed           History reviewed. No pertinent past medical history.  History reviewed. No pertinent surgical history.  There were no vitals filed for this visit.   Subjective Assessment - 10/21/19 1437    Subjective Got "chewed out" by his kids for not using his 4WW in the house. Feels comfortable using his SPC inside. Denies recent falls or questions about HEP.    Pertinent History HTN, long term use of anticoagulents, hx PE 2015, kyphosis, h/o malignant melanoma, HLD    Diagnostic tests none    Patient Stated Goals "to be able to walk a little better and not be as unsteady on my feet"    Currently in Pain? No/denies                             [facility] Adult PT Treatment/Exercise - 10/21/19 0001      Neuro Re-ed    Neuro Re-ed Details  hip hinges at wall with 4WW in front- 10x hips to shoulders, 10x shoulders to hips      Exercises   Exercises Knee/Hip      Knee/Hip Exercises: Aerobic   Nustep Lvl 4, 6 min (UE/LE)      Knee/Hip Exercises: Standing   Heel Raises Both;20 reps    Heel Raises Limitations heel/toe raise    cues to avoid posterior lean     Knee/Hip Exercises: Seated   Sit to Sand 1 set;without UE support;15 reps   holding  ball at chest and sitting on 1 airex; with CGA                 PT Education - 10/21/19 1516    Education Details update to HEP with review of each step of a STS transfer for improved carryover at home; edu on "tall" rollator for improved posture with ambulation    Person(s) Educated Patient    Methods Explanation;Demonstration;Tactile cues;Verbal cues;Handout    Comprehension Verbalized understanding;Returned demonstration            PT Short Term Goals - 10/14/19 1501      PT SHORT TERM GOAL #1   Title Patient to be independent with initial HEP.    Time 4    Period Weeks    Status Achieved    Target Date 10/07/19             PT Long Term Goals - 09/23/19 1451      PT LONG TERM GOAL #1   Title Patient to be independent with advanced HEP.    Time 8    Period Weeks  Status On-going      PT LONG TERM GOAL #2   Title Patient to demonstrate consistent AD use during with ambulation for improved safety.    Time 8    Period Weeks    Status On-going      PT LONG TERM GOAL #3   Title Patient to improve Berg score by 15 points in order to decrease risk of falls.    Baseline 09/08/19: 16/56    Time 8    Period Weeks    Status On-going      PT LONG TERM GOAL #4   Title Patient to decrease TUG time with LRAD by 10 seconds in order to decrease risk of falls.    Baseline 26.21 sec with SPC    Time 8    Period Weeks    Status On-going      PT LONG TERM GOAL #5   Title Patient to report compliance with light fitness regimen 3x/week for continued activity tolerance.    Time 8    Period Weeks    Status On-going                 Plan - 10/21/19 1517    Clinical Impression Statement Patient without new complaints today. Notes that he feels that his rollator is still short for him, and tends to use his SPC while at home. Educated patient on "tall" rollator models for improved posture with ambulation. Worked on STS transfers without UE support. Patient  required consistent cueing to lean shoulders forward over toes and to shift hips forward over toes upon standing. Patient with tendency to shift posteriorly on heels d/t kyphotic posture, which was able to be corrected with cueing. Worked on encouraging forward weight shift onto toes and anterior trunk lean with hip hinges at wall. Good carryover demonstrated with STS transfers after this activity, as patient demonstrated improved ability to lean chest forward before standing. Updated to HEP with review of each step of a STS transfer for improved carryover at home. Patient reported understanding and without complaints at end of session. Patient progressing well towards goals.    Comorbidities HTN, long term use of anticoagulents, hx PE 2015, kyphosis, h/o malignant melanoma, HLD    Rehab Potential Good    PT Frequency 1x / week    PT Treatment/Interventions ADLs/Self Care Home Management;Cryotherapy;Electrical Stimulation;Moist Heat;Balance training;Therapeutic exercise;Therapeutic activities;Functional mobility training;Stair training;Gait training;DME Instruction;Ultrasound;Neuromuscular re-education;Patient/family education;Manual techniques;Taping;Energy conservation;Dry needling;Passive range of motion    PT Next Visit Plan Gait training RW, review steps of STS transfer as written on HEP handout; stair training    Consulted and Agree with Plan of Care Patient           Patient will benefit from skilled therapeutic intervention in order to improve the following deficits and impairments:  Abnormal gait, Decreased activity tolerance, Decreased strength, Decreased balance, Decreased mobility, Difficulty walking, Improper body mechanics, Decreased range of motion, Impaired flexibility, Postural dysfunction  Visit Diagnosis: Unsteadiness on feet  Other abnormalities of gait and mobility  Abnormal posture  Difficulty in walking, not elsewhere classified     Problem List There are no problems  to display for this patient.    Janene Harvey, PT, DPT 10/21/19 3:24 PM   [facility] [address]  [place], [place], [address] Phone: [phone]   Fax:  [phone]  Name: John Lambert MRN: [id number] Date of Birth: 1940/08/03

## 2019-10-28 ENCOUNTER — Ambulatory Visit: Payer: Medicare Other | Admitting: Physical Therapy

## 2019-10-28 ENCOUNTER — Encounter: Payer: Self-pay | Admitting: Physical Therapy

## 2019-10-28 ENCOUNTER — Other Ambulatory Visit: Payer: Self-pay

## 2019-10-28 DIAGNOSIS — R293 Abnormal posture: Secondary | ICD-10-CM

## 2019-10-28 DIAGNOSIS — R2681 Unsteadiness on feet: Secondary | ICD-10-CM

## 2019-10-28 DIAGNOSIS — R2689 Other abnormalities of gait and mobility: Secondary | ICD-10-CM

## 2019-10-28 DIAGNOSIS — R262 Difficulty in walking, not elsewhere classified: Secondary | ICD-10-CM

## 2019-10-28 NOTE — Therapy (Signed)
[facility] [address]  [place], [place], [address] Phone: [phone]   Fax:  [phone]  Physical Therapy Treatment  Patient Details  Name: John Lambert MRN: [id number] Date of Birth: 1940/09/22 Referring Provider (PT): Harlan Stains, MD   Encounter Date: 10/28/2019   PT End of Session - 10/28/19 1529    Visit Number 6    Number of Visits 9    Date for PT Re-Evaluation 11/04/19    Authorization Type AARP    PT Start Time 1448    PT Stop Time 1530    PT Time Calculation (min) 42 min    Equipment Utilized During Treatment Gait belt    Activity Tolerance Patient tolerated treatment well    Behavior During Therapy [facility] for tasks assessed/performed           History reviewed. No pertinent past medical history.  History reviewed. No pertinent surgical history.  There were no vitals filed for this visit.   Subjective Assessment - 10/28/19 1447    Subjective Tired today since he woke up early and had a MD appointment earlier. Had trouble with STS at home.    Pertinent History HTN, long term use of anticoagulents, hx PE 2015, kyphosis, h/o malignant melanoma, HLD    Diagnostic tests none    Patient Stated Goals "to be able to walk a little better and not be as unsteady on my feet"    Currently in Pain? No/denies                             [facility] Adult PT Treatment/Exercise - 10/28/19 0001      Neuro Re-ed    Neuro Re-ed Details  seating anterior trunk lean torard target before STS x10; alternating toe tap on 6" step with walker support x20      Knee/Hip Exercises: Aerobic   Nustep Lvl 4, 6 min (UE/LE)      Knee/Hip Exercises: Standing   Forward Step Up Right;Left;1 set;10 reps;Hand Hold: 1;Step Height: 4"    Forward Step Up Limitations R and L step up/back  with 1 hand support on counter   cues for foot placement and shifting hips forward upon stand     Knee/Hip Exercises: Seated    Sit to Sand without UE support;5 reps;3 sets   sitting on airex                   PT Short Term Goals - 10/14/19 1501      PT SHORT TERM GOAL #1   Title Patient to be independent with initial HEP.    Time 4    Period Weeks    Status Achieved    Target Date 10/07/19             PT Long Term Goals - 09/23/19 1451      PT LONG TERM GOAL #1   Title Patient to be independent with advanced HEP.    Time 8    Period Weeks    Status On-going      PT LONG TERM GOAL #2   Title Patient to demonstrate consistent AD use during with ambulation for improved safety.    Time 8    Period Weeks    Status On-going      PT LONG TERM GOAL #3   Title Patient to improve Berg score by 15 points in order to  decrease risk of falls.    Baseline 09/08/19: 16/56    Time 8    Period Weeks    Status On-going      PT LONG TERM GOAL #4   Title Patient to decrease TUG time with LRAD by 10 seconds in order to decrease risk of falls.    Baseline 26.21 sec with SPC    Time 8    Period Weeks    Status On-going      PT LONG TERM GOAL #5   Title Patient to report compliance with light fitness regimen 3x/week for continued activity tolerance.    Time 8    Period Weeks    Status On-going                 Plan - 10/28/19 1530    Clinical Impression Statement Patient reports feeling tired and having had trouble with STS exercise at home. Reviewed this activity, with patient demonstrating hesitancy for anterior trunk lean. Attempted use of manual, verbal, and visual cues with use of therapist's hand as target to promote anterior trunk lean. Patient was able to demonstrate improved technique after several reps as practice. Initiated forward step ups with counter support with patient demonstrating excellent ability to stand upright upon stepping up onto step. Patient without complaints at end of session. Patient demonstrating good effort with therapy despite some difficulty this session.     Comorbidities HTN, long term use of anticoagulents, hx PE 2015, kyphosis, h/o malignant melanoma, HLD    Rehab Potential Good    PT Frequency 1x / week    PT Treatment/Interventions ADLs/Self Care Home Management;Cryotherapy;Electrical Stimulation;Moist Heat;Balance training;Therapeutic exercise;Therapeutic activities;Functional mobility training;Stair training;Gait training;DME Instruction;Ultrasound;Neuromuscular re-education;Patient/family education;Manual techniques;Taping;Energy conservation;Dry needling;Passive range of motion    PT Next Visit Plan Gait training RW, review steps of STS transfer as written on HEP handout; stair training    Consulted and Agree with Plan of Care Patient           Patient will benefit from skilled therapeutic intervention in order to improve the following deficits and impairments:  Abnormal gait, Decreased activity tolerance, Decreased strength, Decreased balance, Decreased mobility, Difficulty walking, Improper body mechanics, Decreased range of motion, Impaired flexibility, Postural dysfunction  Visit Diagnosis: Unsteadiness on feet  Other abnormalities of gait and mobility  Abnormal posture  Difficulty in walking, not elsewhere classified     Problem List There are no problems to display for this patient.    Janene Harvey, PT, DPT 10/28/19 3:33 PM   [facility] [address]  [place], [place], [address] Phone: [phone]   Fax:  [phone]  Name: John Lambert MRN: [id number] Date of Birth: 1940/09/07

## 2019-11-04 ENCOUNTER — Encounter: Payer: Self-pay | Admitting: Physical Therapy

## 2019-11-04 ENCOUNTER — Ambulatory Visit: Payer: Medicare Other | Admitting: Physical Therapy

## 2019-11-04 ENCOUNTER — Other Ambulatory Visit: Payer: Self-pay

## 2019-11-04 DIAGNOSIS — R2681 Unsteadiness on feet: Secondary | ICD-10-CM

## 2019-11-04 DIAGNOSIS — R293 Abnormal posture: Secondary | ICD-10-CM

## 2019-11-04 DIAGNOSIS — R262 Difficulty in walking, not elsewhere classified: Secondary | ICD-10-CM

## 2019-11-04 DIAGNOSIS — R2689 Other abnormalities of gait and mobility: Secondary | ICD-10-CM

## 2019-11-04 NOTE — Therapy (Signed)
[facility] [address]  [place], [place], [address] Phone: [phone]   Fax:  [phone]  Physical Therapy Discharge Summary  Patient Details  Name: John Lambert MRN: [id number] Date of Birth: [birth year] Referring Provider (PT): Harlan Stains, MD   Progress Note Reporting Period 09/09/19 to 11/04/19  See note below for Objective Data and Assessment of Progress/Goals.     Encounter Date: 11/04/2019   PT End of Session - 11/04/19 1625    Visit Number 7    Number of Visits 9    Date for PT Re-Evaluation 11/04/19    Authorization Type [employer]    PT Start Time 4166    PT Stop Time 1533    PT Time Calculation (min) 50 min    Equipment Utilized During Treatment Gait belt    Activity Tolerance Patient tolerated treatment well    Behavior During Therapy WFL for tasks assessed/performed           History reviewed. No pertinent past medical history.  History reviewed. No pertinent surgical history.  There were no vitals filed for this visit.   Subjective Assessment - 11/04/19 1445    Subjective Not having as much trouble getting up from a chair using his arms, but is still having a tough time without UE use. Feels like he has improved "a little bit" and feels more steady on his feet.    Pertinent History HTN, long term use of anticoagulents, hx PE 2015, kyphosis, h/o malignant melanoma, HLD    Diagnostic tests none    Patient Stated Goals "to be able to walk a little better and not be as unsteady on my feet"    Currently in Pain? No/denies              [facility] PT Assessment - 11/04/19 0001      Assessment   Medical Diagnosis Abnormal gait    Referring Provider (PT) Harlan Stains, MD    Onset Date/Surgical Date --   several years     Berg Balance Test   Sit to Stand Able to stand using hands after several tries    Standing Unsupported Able to stand 2 minutes with supervision    Sitting with Back  Unsupported but Feet Supported on Floor or Stool Able to sit safely and securely 2 minutes    Stand to Sit Sits safely with minimal use of hands    Transfers Able to transfer with verbal cueing and /or supervision    Standing Unsupported with Eyes Closed Able to stand 10 seconds with supervision    Standing Unsupported with Feet Together Needs help to attain position and unable to hold for 15 seconds    From Standing, Reach Forward with Outstretched Arm Can reach forward >12 cm safely (5")    From Standing Position, Pick up Object from Floor Unable to try/needs assist to keep balance    From Standing Position, Turn to Look Behind Over each Shoulder Turn sideways only but maintains balance    Turn 360 Degrees Able to turn 360 degrees safely but slowly    Standing Unsupported, Alternately Place Feet on Step/Stool Needs assistance to keep from falling or unable to try    Standing Unsupported, One Foot in Front Loses balance while stepping or standing    Standing on One Leg Unable to try or needs assist to prevent fall    Total Score 25      Timed Up and  Go Test   TUG Normal TUG    Normal TUG (seconds) --   trial 1: 22.98 with 4WW, trial 2: 20.86 sec with 4WW                        [facility] Adult PT Treatment/Exercise - 11/04/19 0001      Knee/Hip Exercises: Aerobic   Nustep Lvl 4, 6 min (UE/LE)      Knee/Hip Exercises: Standing   Knee Flexion Right;Left;10 reps;Strengthening    Knee Flexion Limitations yellow loop    cues to avoid using momentum                 PT Education - 11/04/19 1624    Education Details re-administered HEP via email; discussion on objective progress and remaining impairments    Person(s) Educated Patient    Methods Explanation;Demonstration;Tactile cues;Verbal cues    Comprehension Verbalized understanding            PT Short Term Goals - 11/04/19 1448      PT SHORT TERM GOAL #1   Title Patient to be independent with initial HEP.     Time 4    Period Weeks    Status Achieved    Target Date 10/07/19             PT Long Term Goals - 11/04/19 1448      PT LONG TERM GOAL #1   Title Patient to be independent with advanced HEP.    Time 8    Period Weeks    Status Achieved      PT LONG TERM GOAL #2   Title Patient to demonstrate consistent AD use during with ambulation for improved safety.    Time 8    Period Weeks    Status Achieved   using SPC inside, 4WW outside and reports consistent use     PT LONG TERM GOAL #3   Title Patient to improve Berg score by 15 points in order to decrease risk of falls.    Baseline 09/08/19: 16/56    Time 8    Period Weeks    Status Not Met   11/04/19 25/56     PT LONG TERM GOAL #4   Title Patient to decrease TUG time with LRAD by 10 seconds in order to decrease risk of falls.    Baseline 26.21 sec with SPC    Time 8    Period Weeks    Status Not Met   11/04/19 20.83 sec with 4WW     PT LONG TERM GOAL #5   Title Patient to report compliance with light fitness regimen 3x/week for continued activity tolerance.    Time 8    Period Weeks    Status Not Met   patient has not started                Plan - 11/04/19 1621    Clinical Impression Statement Patient reported "a little bit" of improvement in ability to stand up from a chair with UE assistance as well as feeling more steady on his feet with the help of his 4WW. Patient reported partial compliance with HEP, thus educated patient on importance of increased HEP compliance as well as fitness program using the stationary bike he owns. Patient reported understanding. Patient reports consistent use of SPC inside and 4WW outside. Patient has demonstrated good improvement in TUG and BERG testing today. However, still with considerable difficulty performing standing dynamic  standing balance activities. Patient also requiring consistent cueing for proper safety with transfers today, as patient with tendency to pull on walker to  stand. Reviewed HEP with patient for max carryover- patient reported understanding. Patient requesting to be DC'd at this time d/t financial reasons.    Comorbidities HTN, long term use of anticoagulents, hx PE 2015, kyphosis, h/o malignant melanoma, HLD    Rehab Potential Good    PT Frequency 1x / week    PT Treatment/Interventions ADLs/Self Care Home Management;Cryotherapy;Electrical Stimulation;Moist Heat;Balance training;Therapeutic exercise;Therapeutic activities;Functional mobility training;Stair training;Gait training;DME Instruction;Ultrasound;Neuromuscular re-education;Patient/family education;Manual techniques;Taping;Energy conservation;Dry needling;Passive range of motion    PT Next Visit Plan DC at this time    Consulted and Agree with Plan of Care Patient           Patient will benefit from skilled therapeutic intervention in order to improve the following deficits and impairments:  Abnormal gait, Decreased activity tolerance, Decreased strength, Decreased balance, Decreased mobility, Difficulty walking, Improper body mechanics, Decreased range of motion, Impaired flexibility, Postural dysfunction  Visit Diagnosis: Unsteadiness on feet  Other abnormalities of gait and mobility  Abnormal posture  Difficulty in walking, not elsewhere classified     Problem List There are no problems to display for this patient.    Janene Harvey, PT, DPT 11/04/19 4:27 PM   [facility] [address]  [place], [place], [address] Phone: [phone]   Fax:  [phone]  Name: John Lambert MRN: [id number] Date of Birth: 03-Feb-1941

## 2020-04-19 ENCOUNTER — Emergency Department (HOSPITAL_BASED_OUTPATIENT_CLINIC_OR_DEPARTMENT_OTHER): Payer: Medicare Other

## 2020-04-19 ENCOUNTER — Other Ambulatory Visit: Payer: Self-pay

## 2020-04-19 ENCOUNTER — Encounter (HOSPITAL_BASED_OUTPATIENT_CLINIC_OR_DEPARTMENT_OTHER): Payer: Self-pay

## 2020-04-19 ENCOUNTER — Inpatient Hospital Stay (HOSPITAL_BASED_OUTPATIENT_CLINIC_OR_DEPARTMENT_OTHER)
Admission: EM | Admit: 2020-04-19 | Discharge: 2020-05-25 | DRG: 034 | Disposition: A | Discharge status: Skilled Nursing Facility | Payer: Medicare Other | Attending: Internal Medicine | Admitting: Internal Medicine

## 2020-04-19 DIAGNOSIS — G031 Chronic meningitis: Secondary | ICD-10-CM | POA: Diagnosis present

## 2020-04-19 DIAGNOSIS — E876 Hypokalemia: Secondary | ICD-10-CM | POA: Diagnosis not present

## 2020-04-19 DIAGNOSIS — I97821 Postprocedural cerebrovascular infarction during other surgery: Secondary | ICD-10-CM | POA: Diagnosis not present

## 2020-04-19 DIAGNOSIS — R0602 Shortness of breath: Secondary | ICD-10-CM

## 2020-04-19 DIAGNOSIS — U071 COVID-19: Secondary | ICD-10-CM | POA: Diagnosis not present

## 2020-04-19 DIAGNOSIS — M79652 Pain in left thigh: Secondary | ICD-10-CM | POA: Diagnosis not present

## 2020-04-19 DIAGNOSIS — Z79899 Other long term (current) drug therapy: Secondary | ICD-10-CM

## 2020-04-19 DIAGNOSIS — W19XXXA Unspecified fall, initial encounter: Secondary | ICD-10-CM | POA: Diagnosis present

## 2020-04-19 DIAGNOSIS — I6522 Occlusion and stenosis of left carotid artery: Secondary | ICD-10-CM | POA: Diagnosis present

## 2020-04-19 DIAGNOSIS — R471 Dysarthria and anarthria: Secondary | ICD-10-CM | POA: Diagnosis not present

## 2020-04-19 DIAGNOSIS — R29701 NIHSS score 1: Secondary | ICD-10-CM | POA: Diagnosis present

## 2020-04-19 DIAGNOSIS — R310 Gross hematuria: Secondary | ICD-10-CM | POA: Diagnosis not present

## 2020-04-19 DIAGNOSIS — D631 Anemia in chronic kidney disease: Secondary | ICD-10-CM | POA: Diagnosis present

## 2020-04-19 DIAGNOSIS — R262 Difficulty in walking, not elsewhere classified: Secondary | ICD-10-CM

## 2020-04-19 DIAGNOSIS — Z87891 Personal history of nicotine dependence: Secondary | ICD-10-CM

## 2020-04-19 DIAGNOSIS — R17 Unspecified jaundice: Secondary | ICD-10-CM | POA: Diagnosis not present

## 2020-04-19 DIAGNOSIS — T17908A Unspecified foreign body in respiratory tract, part unspecified causing other injury, initial encounter: Secondary | ICD-10-CM

## 2020-04-19 DIAGNOSIS — N401 Enlarged prostate with lower urinary tract symptoms: Secondary | ICD-10-CM | POA: Diagnosis present

## 2020-04-19 DIAGNOSIS — Z885 Allergy status to narcotic agent status: Secondary | ICD-10-CM

## 2020-04-19 DIAGNOSIS — E870 Hyperosmolality and hypernatremia: Secondary | ICD-10-CM | POA: Diagnosis not present

## 2020-04-19 DIAGNOSIS — I129 Hypertensive chronic kidney disease with stage 1 through stage 4 chronic kidney disease, or unspecified chronic kidney disease: Secondary | ICD-10-CM | POA: Diagnosis not present

## 2020-04-19 DIAGNOSIS — T45515A Adverse effect of anticoagulants, initial encounter: Secondary | ICD-10-CM | POA: Diagnosis not present

## 2020-04-19 DIAGNOSIS — I6602 Occlusion and stenosis of left middle cerebral artery: Secondary | ICD-10-CM | POA: Diagnosis present

## 2020-04-19 DIAGNOSIS — R7989 Other specified abnormal findings of blood chemistry: Secondary | ICD-10-CM

## 2020-04-19 DIAGNOSIS — I861 Scrotal varices: Secondary | ICD-10-CM | POA: Diagnosis not present

## 2020-04-19 DIAGNOSIS — G8191 Hemiplegia, unspecified affecting right dominant side: Secondary | ICD-10-CM | POA: Diagnosis not present

## 2020-04-19 DIAGNOSIS — E872 Acidosis: Secondary | ICD-10-CM | POA: Diagnosis not present

## 2020-04-19 DIAGNOSIS — D75839 Thrombocytosis, unspecified: Secondary | ICD-10-CM | POA: Diagnosis not present

## 2020-04-19 DIAGNOSIS — J9601 Acute respiratory failure with hypoxia: Secondary | ICD-10-CM | POA: Diagnosis not present

## 2020-04-19 DIAGNOSIS — R1310 Dysphagia, unspecified: Secondary | ICD-10-CM

## 2020-04-19 DIAGNOSIS — E785 Hyperlipidemia, unspecified: Secondary | ICD-10-CM | POA: Diagnosis present

## 2020-04-19 DIAGNOSIS — R338 Other retention of urine: Secondary | ICD-10-CM | POA: Diagnosis present

## 2020-04-19 DIAGNOSIS — N5089 Other specified disorders of the male genital organs: Secondary | ICD-10-CM | POA: Diagnosis not present

## 2020-04-19 DIAGNOSIS — I10 Essential (primary) hypertension: Secondary | ICD-10-CM

## 2020-04-19 DIAGNOSIS — G9349 Other encephalopathy: Secondary | ICD-10-CM | POA: Diagnosis not present

## 2020-04-19 DIAGNOSIS — I63412 Cerebral infarction due to embolism of left middle cerebral artery: Secondary | ICD-10-CM | POA: Diagnosis not present

## 2020-04-19 DIAGNOSIS — D62 Acute posthemorrhagic anemia: Secondary | ICD-10-CM | POA: Diagnosis not present

## 2020-04-19 DIAGNOSIS — Z20822 Contact with and (suspected) exposure to covid-19: Secondary | ICD-10-CM | POA: Diagnosis not present

## 2020-04-19 DIAGNOSIS — Z86711 Personal history of pulmonary embolism: Secondary | ICD-10-CM

## 2020-04-19 DIAGNOSIS — N179 Acute kidney failure, unspecified: Secondary | ICD-10-CM

## 2020-04-19 DIAGNOSIS — Z77090 Contact with and (suspected) exposure to asbestos: Secondary | ICD-10-CM | POA: Diagnosis present

## 2020-04-19 DIAGNOSIS — H510 Palsy (spasm) of conjugate gaze: Secondary | ICD-10-CM | POA: Diagnosis not present

## 2020-04-19 DIAGNOSIS — I639 Cerebral infarction, unspecified: Secondary | ICD-10-CM

## 2020-04-19 DIAGNOSIS — E1122 Type 2 diabetes mellitus with diabetic chronic kidney disease: Secondary | ICD-10-CM | POA: Diagnosis present

## 2020-04-19 DIAGNOSIS — Y92009 Unspecified place in unspecified non-institutional (private) residence as the place of occurrence of the external cause: Secondary | ICD-10-CM

## 2020-04-19 DIAGNOSIS — M1612 Unilateral primary osteoarthritis, left hip: Secondary | ICD-10-CM | POA: Diagnosis present

## 2020-04-19 DIAGNOSIS — E8770 Fluid overload, unspecified: Secondary | ICD-10-CM | POA: Diagnosis not present

## 2020-04-19 DIAGNOSIS — Z86718 Personal history of other venous thrombosis and embolism: Secondary | ICD-10-CM

## 2020-04-19 DIAGNOSIS — Y831 Surgical operation with implant of artificial internal device as the cause of abnormal reaction of the patient, or of later complication, without mention of misadventure at the time of the procedure: Secondary | ICD-10-CM | POA: Diagnosis not present

## 2020-04-19 DIAGNOSIS — R945 Abnormal results of liver function studies: Secondary | ICD-10-CM

## 2020-04-19 DIAGNOSIS — F32A Depression, unspecified: Secondary | ICD-10-CM | POA: Diagnosis not present

## 2020-04-19 DIAGNOSIS — N182 Chronic kidney disease, stage 2 (mild): Secondary | ICD-10-CM | POA: Diagnosis present

## 2020-04-19 DIAGNOSIS — R1312 Dysphagia, oropharyngeal phase: Secondary | ICD-10-CM | POA: Diagnosis not present

## 2020-04-19 DIAGNOSIS — D689 Coagulation defect, unspecified: Secondary | ICD-10-CM | POA: Diagnosis present

## 2020-04-19 DIAGNOSIS — Z888 Allergy status to other drugs, medicaments and biological substances status: Secondary | ICD-10-CM

## 2020-04-19 DIAGNOSIS — R29720 NIHSS score 20: Secondary | ICD-10-CM | POA: Diagnosis not present

## 2020-04-19 DIAGNOSIS — N433 Hydrocele, unspecified: Secondary | ICD-10-CM | POA: Diagnosis not present

## 2020-04-19 DIAGNOSIS — N136 Pyonephrosis: Secondary | ICD-10-CM | POA: Diagnosis not present

## 2020-04-19 DIAGNOSIS — J9811 Atelectasis: Secondary | ICD-10-CM | POA: Diagnosis not present

## 2020-04-19 DIAGNOSIS — I6340 Cerebral infarction due to embolism of unspecified cerebral artery: Secondary | ICD-10-CM | POA: Insufficient documentation

## 2020-04-19 DIAGNOSIS — I480 Paroxysmal atrial fibrillation: Secondary | ICD-10-CM | POA: Diagnosis present

## 2020-04-19 DIAGNOSIS — N4889 Other specified disorders of penis: Secondary | ICD-10-CM

## 2020-04-19 DIAGNOSIS — I63132 Cerebral infarction due to embolism of left carotid artery: Secondary | ICD-10-CM

## 2020-04-19 DIAGNOSIS — Z006 Encounter for examination for normal comparison and control in clinical research program: Secondary | ICD-10-CM | POA: Diagnosis not present

## 2020-04-19 DIAGNOSIS — R531 Weakness: Secondary | ICD-10-CM

## 2020-04-19 DIAGNOSIS — Z85820 Personal history of malignant melanoma of skin: Secondary | ICD-10-CM

## 2020-04-19 DIAGNOSIS — R4701 Aphasia: Secondary | ICD-10-CM | POA: Diagnosis not present

## 2020-04-19 DIAGNOSIS — R0600 Dyspnea, unspecified: Secondary | ICD-10-CM

## 2020-04-19 DIAGNOSIS — Z7901 Long term (current) use of anticoagulants: Secondary | ICD-10-CM

## 2020-04-19 DIAGNOSIS — R29810 Facial weakness: Secondary | ICD-10-CM | POA: Diagnosis not present

## 2020-04-19 DIAGNOSIS — R54 Age-related physical debility: Secondary | ICD-10-CM | POA: Diagnosis present

## 2020-04-19 DIAGNOSIS — R52 Pain, unspecified: Secondary | ICD-10-CM

## 2020-04-19 DIAGNOSIS — B961 Klebsiella pneumoniae [K. pneumoniae] as the cause of diseases classified elsewhere: Secondary | ICD-10-CM | POA: Diagnosis not present

## 2020-04-19 HISTORY — DX: Malignant (primary) neoplasm, unspecified: C80.1

## 2020-04-19 HISTORY — DX: Essential (primary) hypertension: I10

## 2020-04-19 HISTORY — DX: Coagulation defect, unspecified: D68.9

## 2020-04-19 HISTORY — DX: Disease of blood and blood-forming organs, unspecified: D75.9

## 2020-04-19 HISTORY — DX: Myoneural disorder, unspecified: G70.9

## 2020-04-19 LAB — URINALYSIS, ROUTINE W REFLEX MICROSCOPIC
Bilirubin Urine: NEGATIVE
Glucose, UA: NEGATIVE mg/dL
Hgb urine dipstick: NEGATIVE
Ketones, ur: 15 mg/dL — AB
Leukocytes,Ua: NEGATIVE
Nitrite: NEGATIVE
Protein, ur: NEGATIVE mg/dL
Specific Gravity, Urine: >1.03 — ABNORMAL HIGH (ref 1.005–1.030)
pH: 5 (ref 5.0–8.0)

## 2020-04-19 LAB — COMPREHENSIVE METABOLIC PANEL
ALT: 16 U/L (ref 0–44)
AST: 18 U/L (ref 15–41)
Albumin: 4.1 g/dL (ref 3.5–5.0)
Alkaline Phosphatase: 57 U/L (ref 38–126)
Anion gap: 9 (ref 5–15)
BUN: 19 mg/dL (ref 8–23)
CO2: 24 mmol/L (ref 22–32)
Calcium: 9 mg/dL (ref 8.9–10.3)
Chloride: 106 mmol/L (ref 98–111)
Creatinine, Ser: 0.86 mg/dL (ref 0.61–1.24)
GFR, Estimated: >60 mL/min (ref >60)
Glucose, Bld: 110 mg/dL — ABNORMAL HIGH (ref 70–99)
Potassium: 4 mmol/L (ref 3.5–5.1)
Sodium: 139 mmol/L (ref 135–145)
Total Bilirubin: 0.9 mg/dL (ref 0.3–1.2)
Total Protein: 6.8 g/dL (ref 6.5–8.1)

## 2020-04-19 LAB — PROTIME-INR
INR: 2.2 — ABNORMAL HIGH (ref 0.8–1.2)
Prothrombin Time: 23.9 seconds — ABNORMAL HIGH (ref 11.4–15.2)

## 2020-04-19 LAB — CBC WITH DIFFERENTIAL/PLATELET
Abs Immature Granulocytes: 0.04 10*3/uL (ref 0.00–0.07)
Basophils Absolute: 0 10*3/uL (ref 0.0–0.1)
Basophils Relative: 0 %
Eosinophils Absolute: 0.2 10*3/uL (ref 0.0–0.5)
Eosinophils Relative: 2 %
HCT: 49 % (ref 39.0–52.0)
Hemoglobin: 16.5 g/dL (ref 13.0–17.0)
Immature Granulocytes: 0 %
Lymphocytes Relative: 13 %
Lymphs Abs: 1.4 10*3/uL (ref 0.7–4.0)
MCH: 30.7 pg (ref 26.0–34.0)
MCHC: 33.7 g/dL (ref 30.0–36.0)
MCV: 91.2 fL (ref 80.0–100.0)
Monocytes Absolute: 0.7 10*3/uL (ref 0.1–1.0)
Monocytes Relative: 7 %
Neutro Abs: 8.4 10*3/uL — ABNORMAL HIGH (ref 1.7–7.7)
Neutrophils Relative %: 78 %
Platelets: 224 10*3/uL (ref 150–400)
RBC: 5.37 MIL/uL (ref 4.22–5.81)
RDW: 13.1 % (ref 11.5–15.5)
WBC: 10.8 10*3/uL — ABNORMAL HIGH (ref 4.0–10.5)
nRBC: 0 % (ref 0.0–0.2)

## 2020-04-19 LAB — RESP PANEL BY RT-PCR (FLU A&B, COVID) ARPGX2
Influenza A by PCR: NEGATIVE
Influenza B by PCR: NEGATIVE
SARS Coronavirus 2 by RT PCR: NEGATIVE

## 2020-04-19 IMAGING — DX DG FEMUR 2+V*L*
4 series · 4 of 4 positions shown · non-contrast
Comparison: None.

CLINICAL DATA: Fall, pain

EXAM:
LEFT FEMUR 2 VIEWS

[femur ap (1 of 2)]
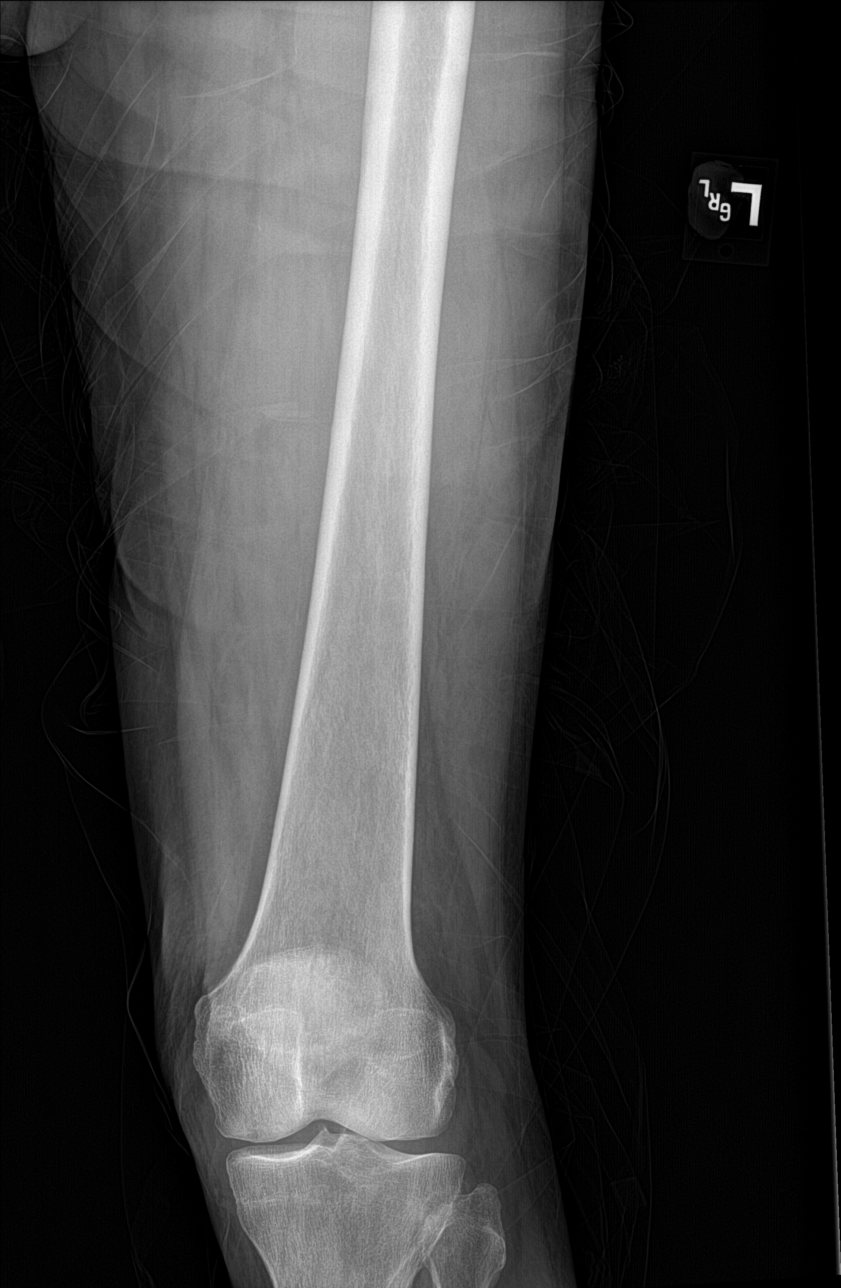

[femur ap (2 of 2)]
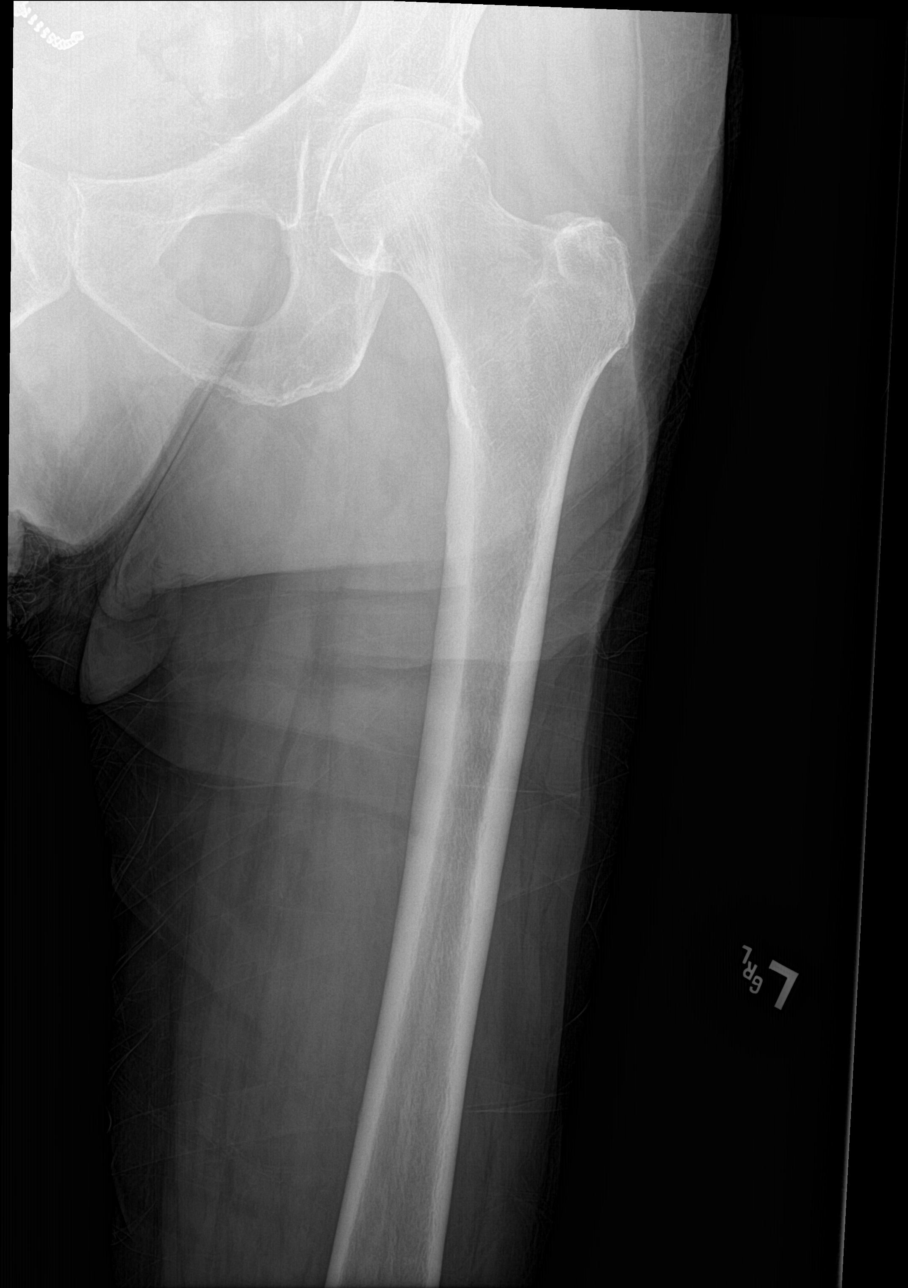

[femur lat (1 of 2)]
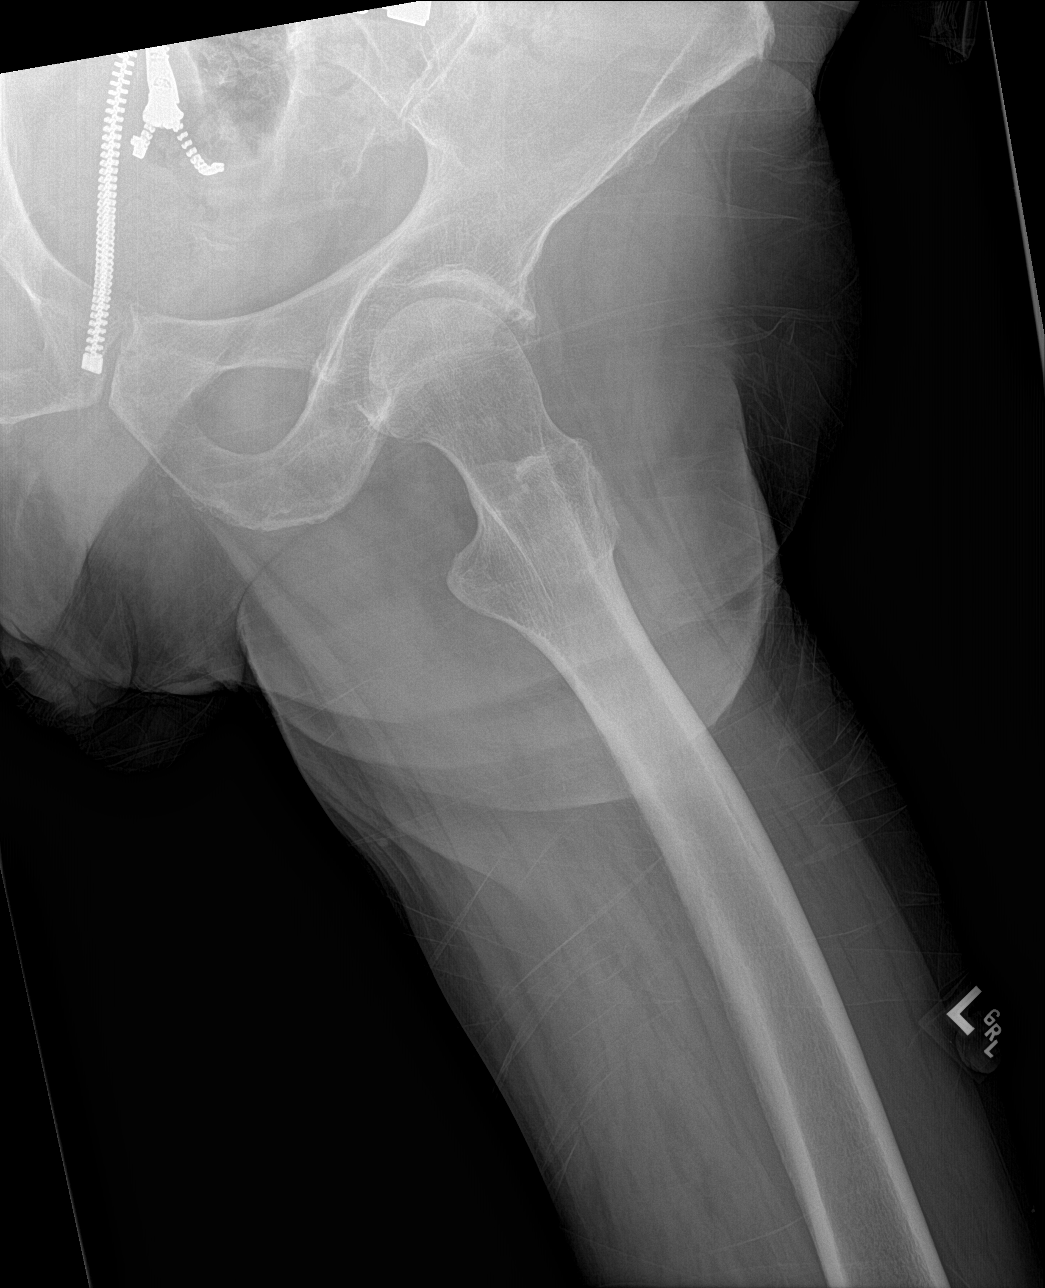

[femur lat (2 of 2)]
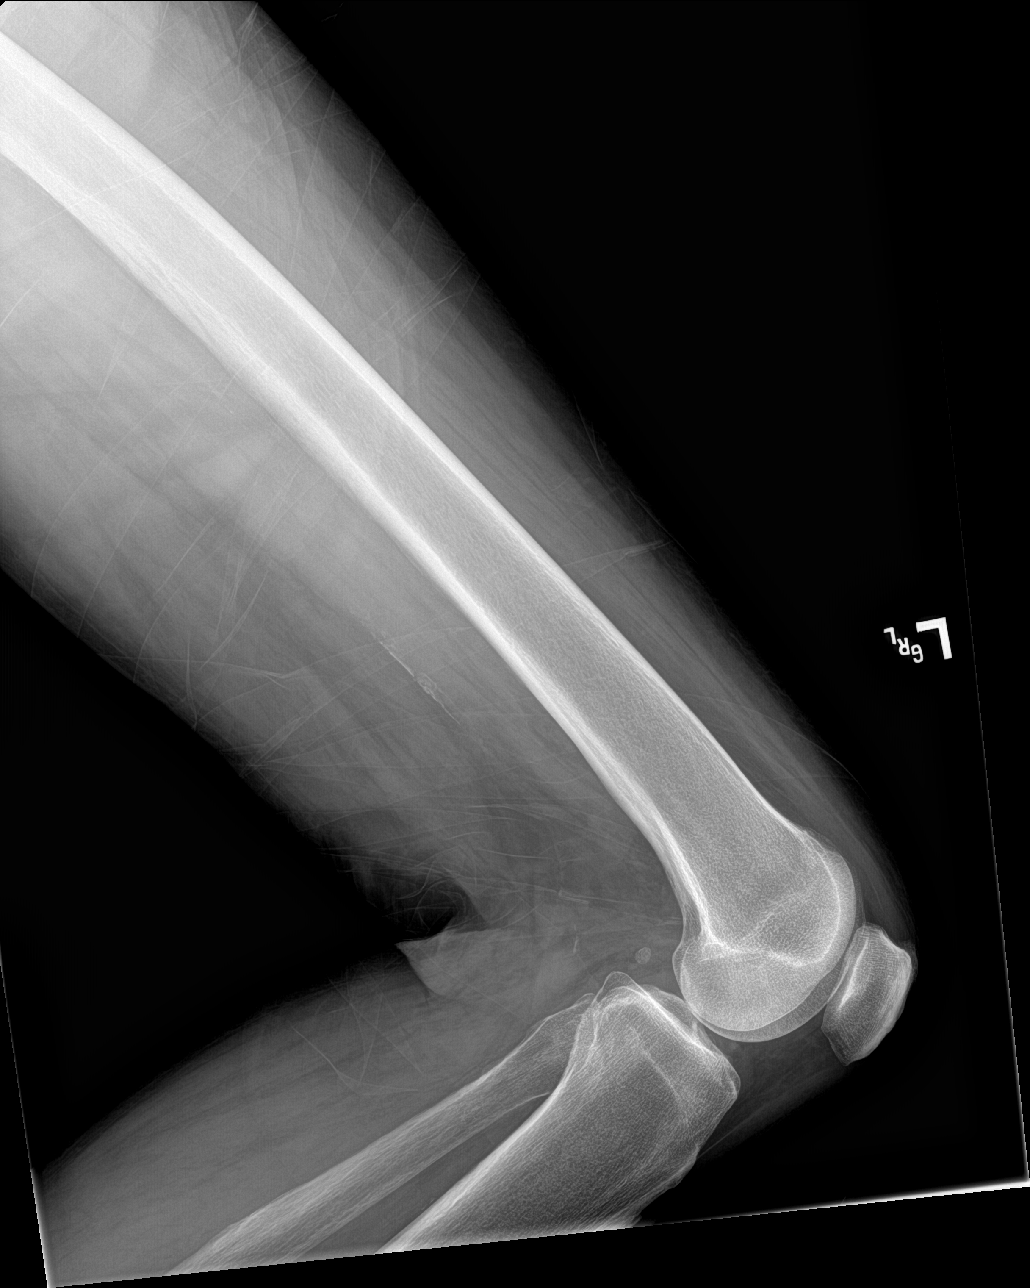

[4 of 4 positions shown; findings below may reference images not displayed]

FINDINGS: No acute bony abnormality. Specifically, no fracture, subluxation,
or dislocation. Joint spaces maintained.
IMPRESSION: No acute bony abnormality.

## 2020-04-19 IMAGING — CT CT HIP*L* W/O CM
2 of 3 series · 17 of 46 positions shown, 19 images · non-contrast
Comparison: X-ray earlier same day

CLINICAL DATA: Hip pain.

EXAM:
CT OF THE LEFT HIP WITHOUT CONTRAST
TECHNIQUE: Multidetector CT imaging of the left hip was performed according to
the standard protocol. Multiplanar CT image reconstructions were
also generated.

[Series 5: axial soft tissue · axial · 0.48mm/px · z∈[+807,+965]mm · 14 of 91 slices shown, 16 images]
[im 6/91  soft-tissue]
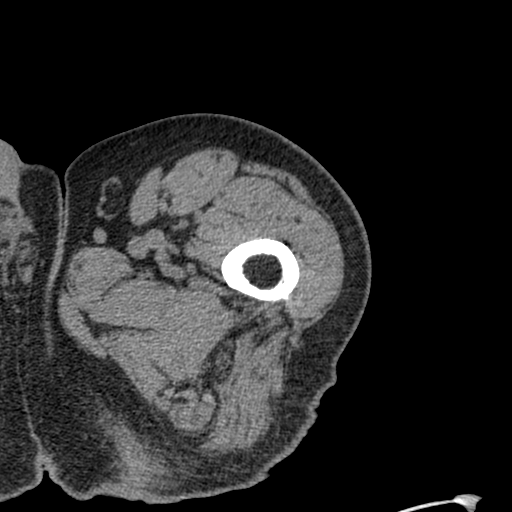
[im 6/91  bone]
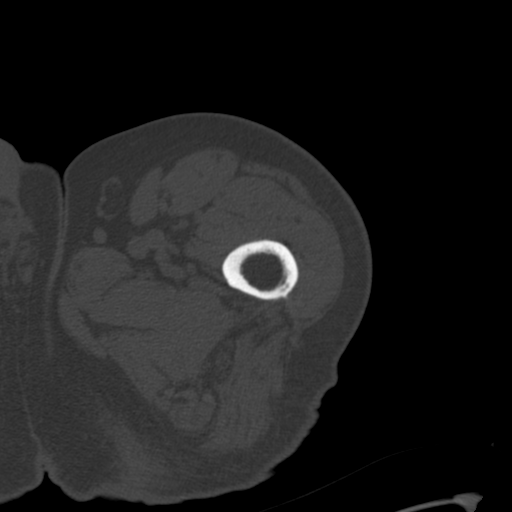
[im 12/91  soft-tissue]
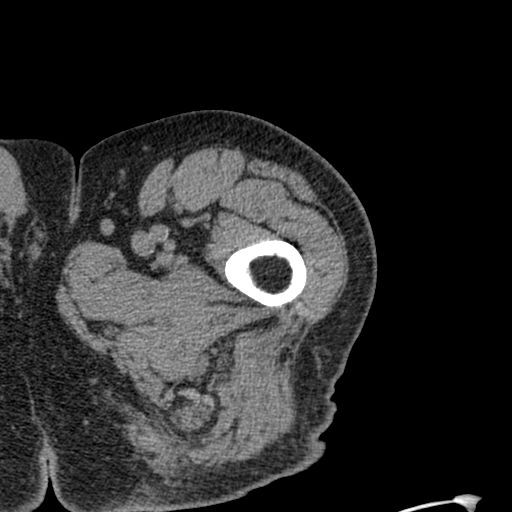
[im 18/91  soft-tissue]
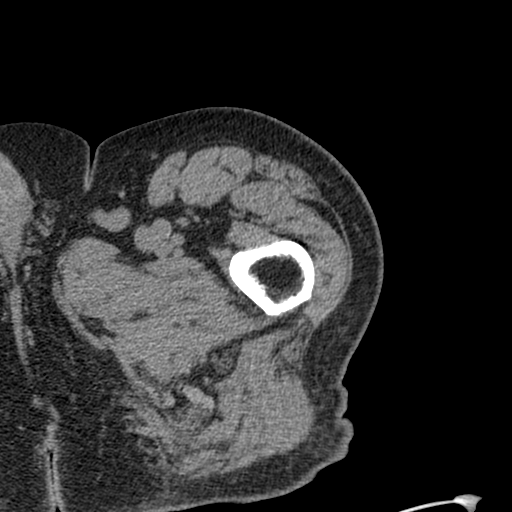
[im 24/91  soft-tissue]
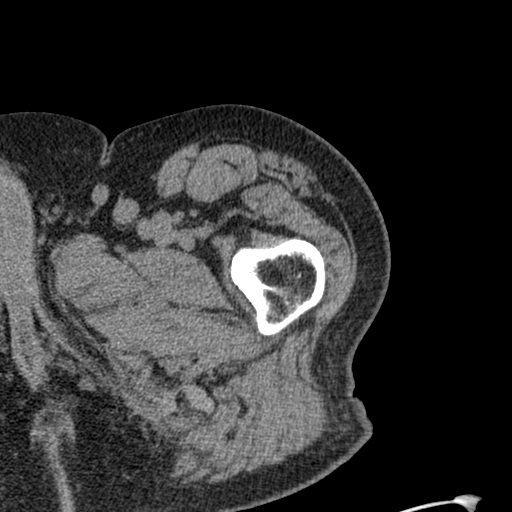
[im 30/91  soft-tissue]
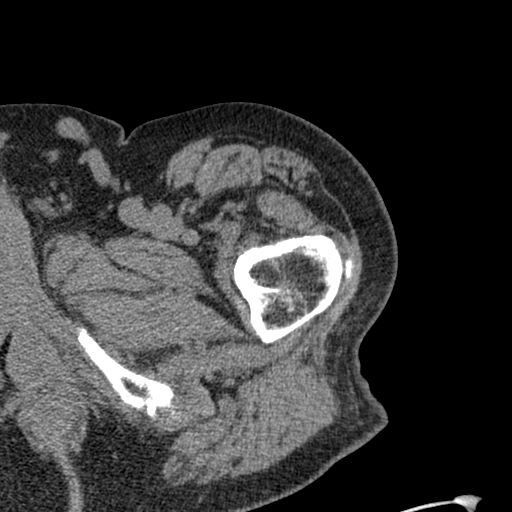
[im 35/91  soft-tissue]
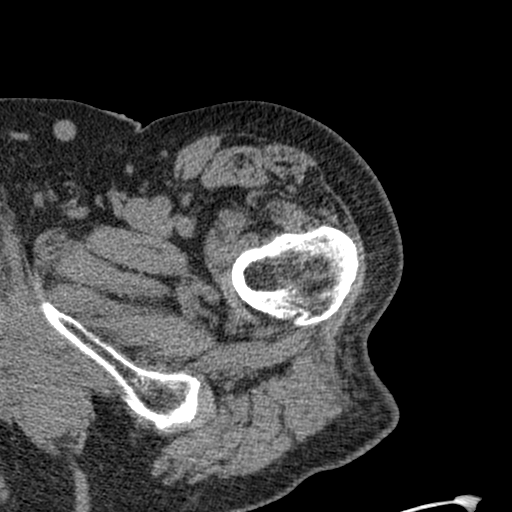
[im 41/91  soft-tissue]
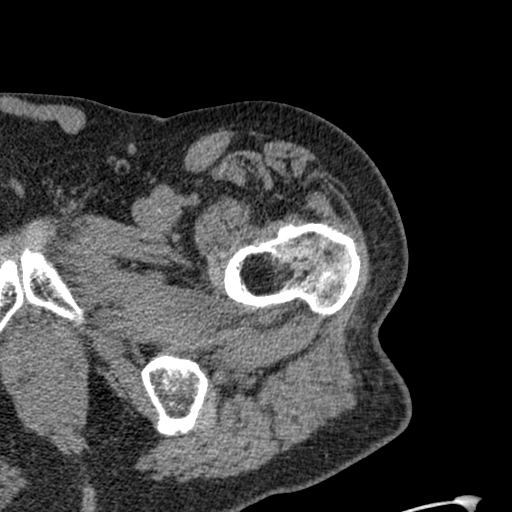
[im 50/91  soft-tissue]
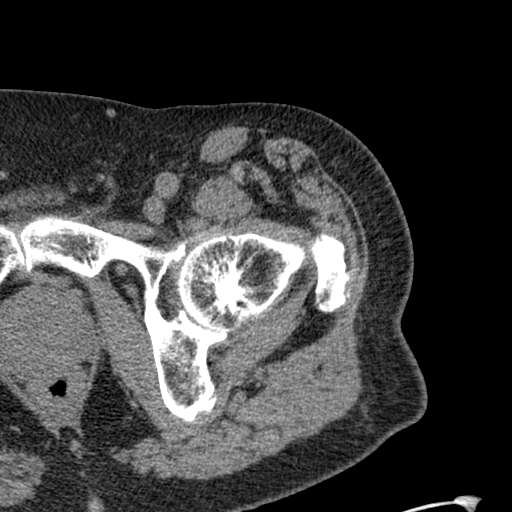
[im 56/91  soft-tissue]
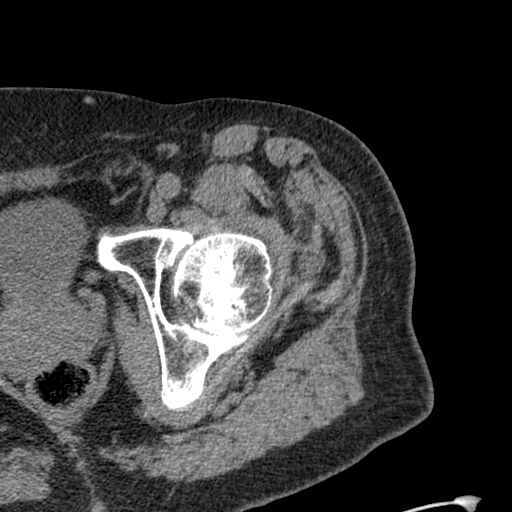
[im 56/91  bone]
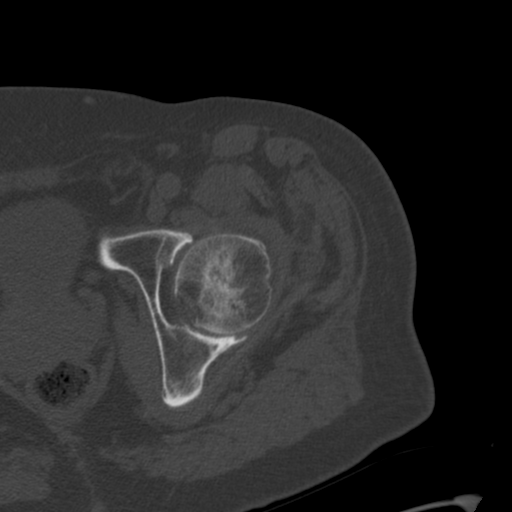
[im 61/91  soft-tissue]
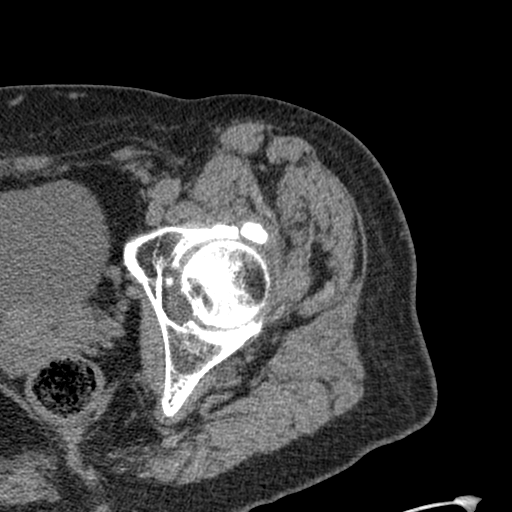
[im 67/91  soft-tissue]
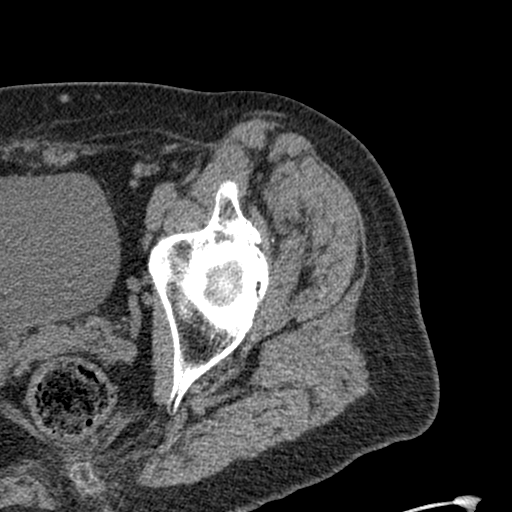
[im 73/91  soft-tissue]
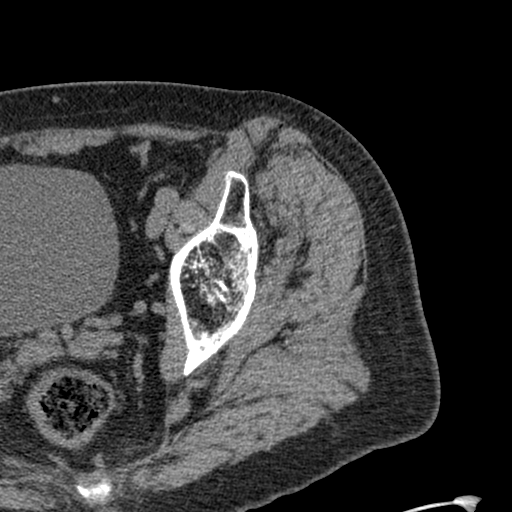
[im 79/91  soft-tissue]
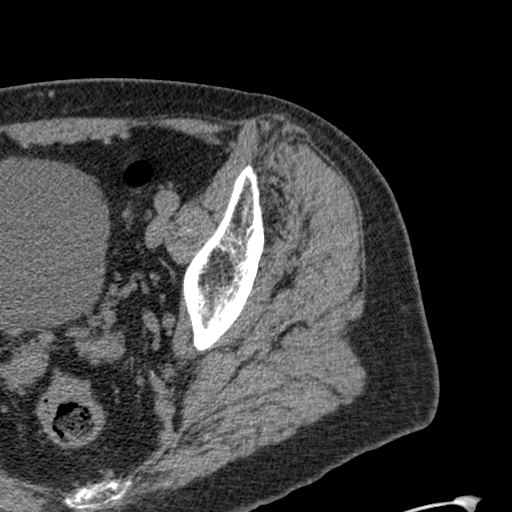
[im 85/91  soft-tissue]
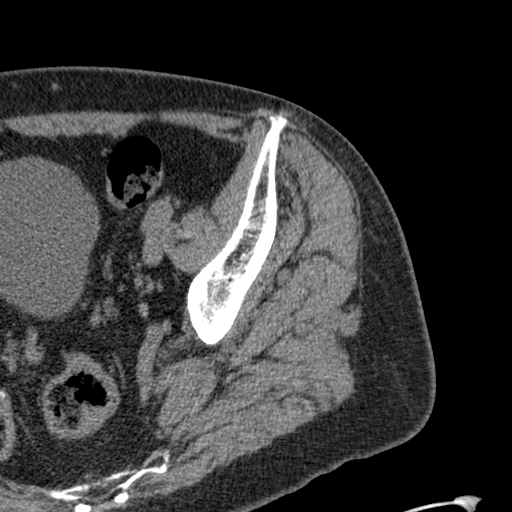

[Series 8: coronal st · coronal · 0.37mm/px · 3 of 82 slices shown]
[im 28/82  soft-tissue]
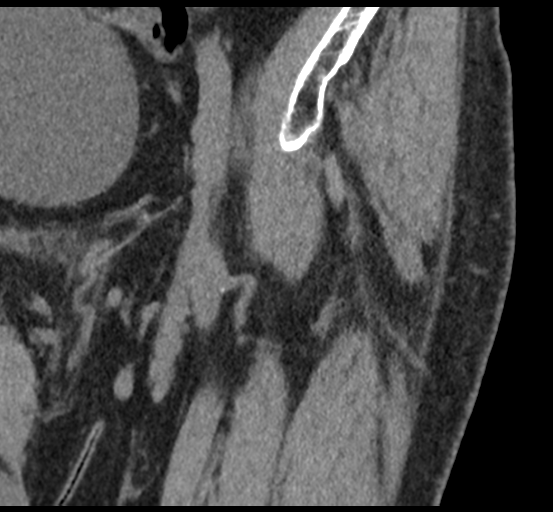
[im 37/82  soft-tissue]
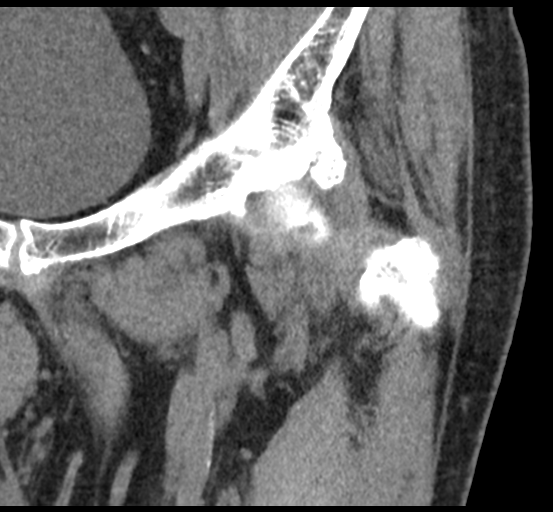
[im 46/82  soft-tissue]
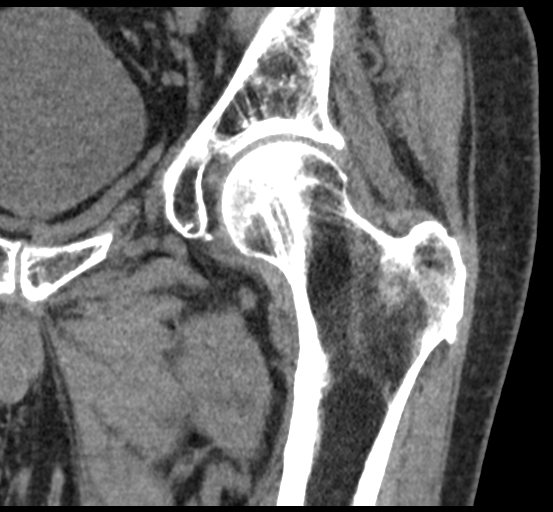

[17 of 46 positions shown; findings below may reference images not displayed]

FINDINGS: Bones/Joint/Cartilage

No evidence for an acute fracture in the visualized left hemipelvis
or proximal left femur. Specifically, no evidence for femoral neck
fracture. No disruption of the tensile or compressive trabeculated.
Patient is noted to have degenerative changes in the acetabulum at
least 2 small ossified intra-articular loose bodies in the joint
space of the left hip. No substantial joint effusion.

Ligaments

Suboptimally assessed by CT.

Muscles and Tendons

No intramuscular or subcutaneous hemorrhage/hematoma.

Soft tissues

Unremarkable.
IMPRESSION: 1. No evidence for acute fracture in the visualized left hemipelvis
or proximal left femur. Specifically, no evidence for femoral neck
fracture.
2. Degenerative changes in the left hip with at least 2 small
ossified intra-articular loose bodies in the joint space of the left
hip.

## 2020-04-19 IMAGING — DX DG HIP (WITH OR WITHOUT PELVIS) 2-3V*L*
3 series · 3 of 3 positions shown · non-contrast
Comparison: None.

CLINICAL DATA: Fall, left hip pain

EXAM:
DG HIP (WITH OR WITHOUT PELVIS) 2-3V LEFT

[pelvis ap (1 of 2)]
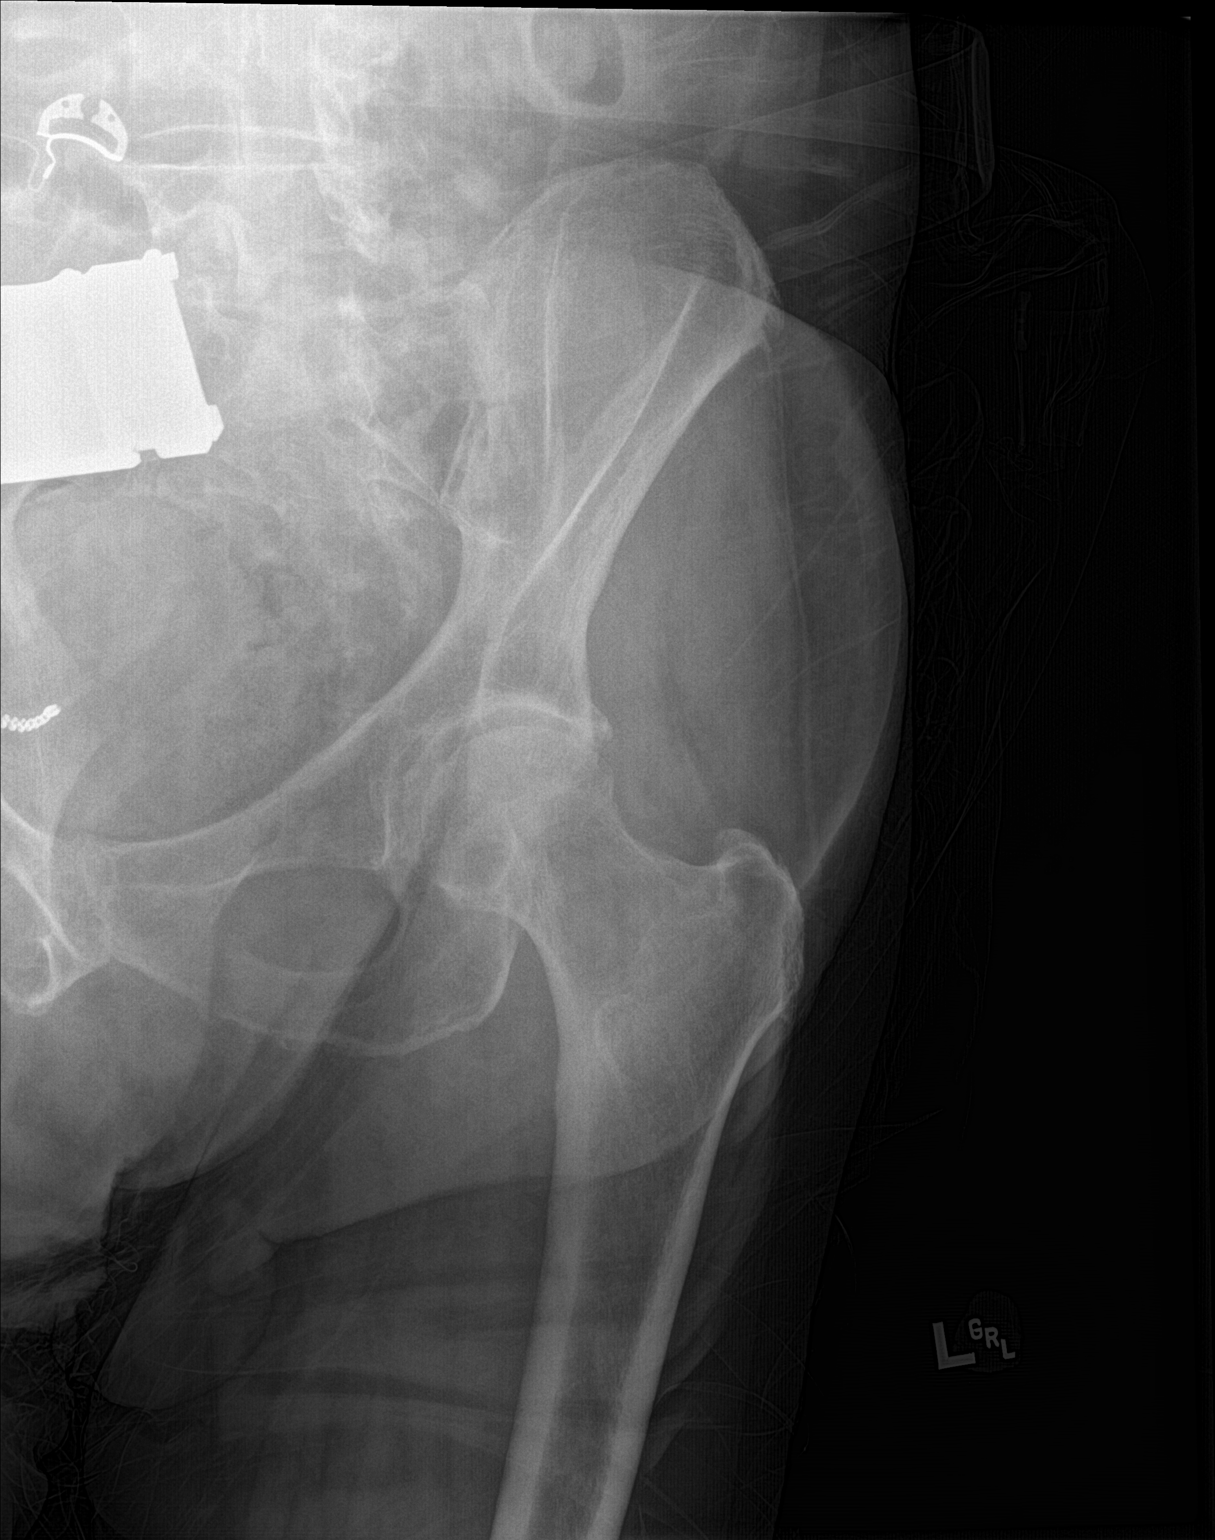

[hip lat]
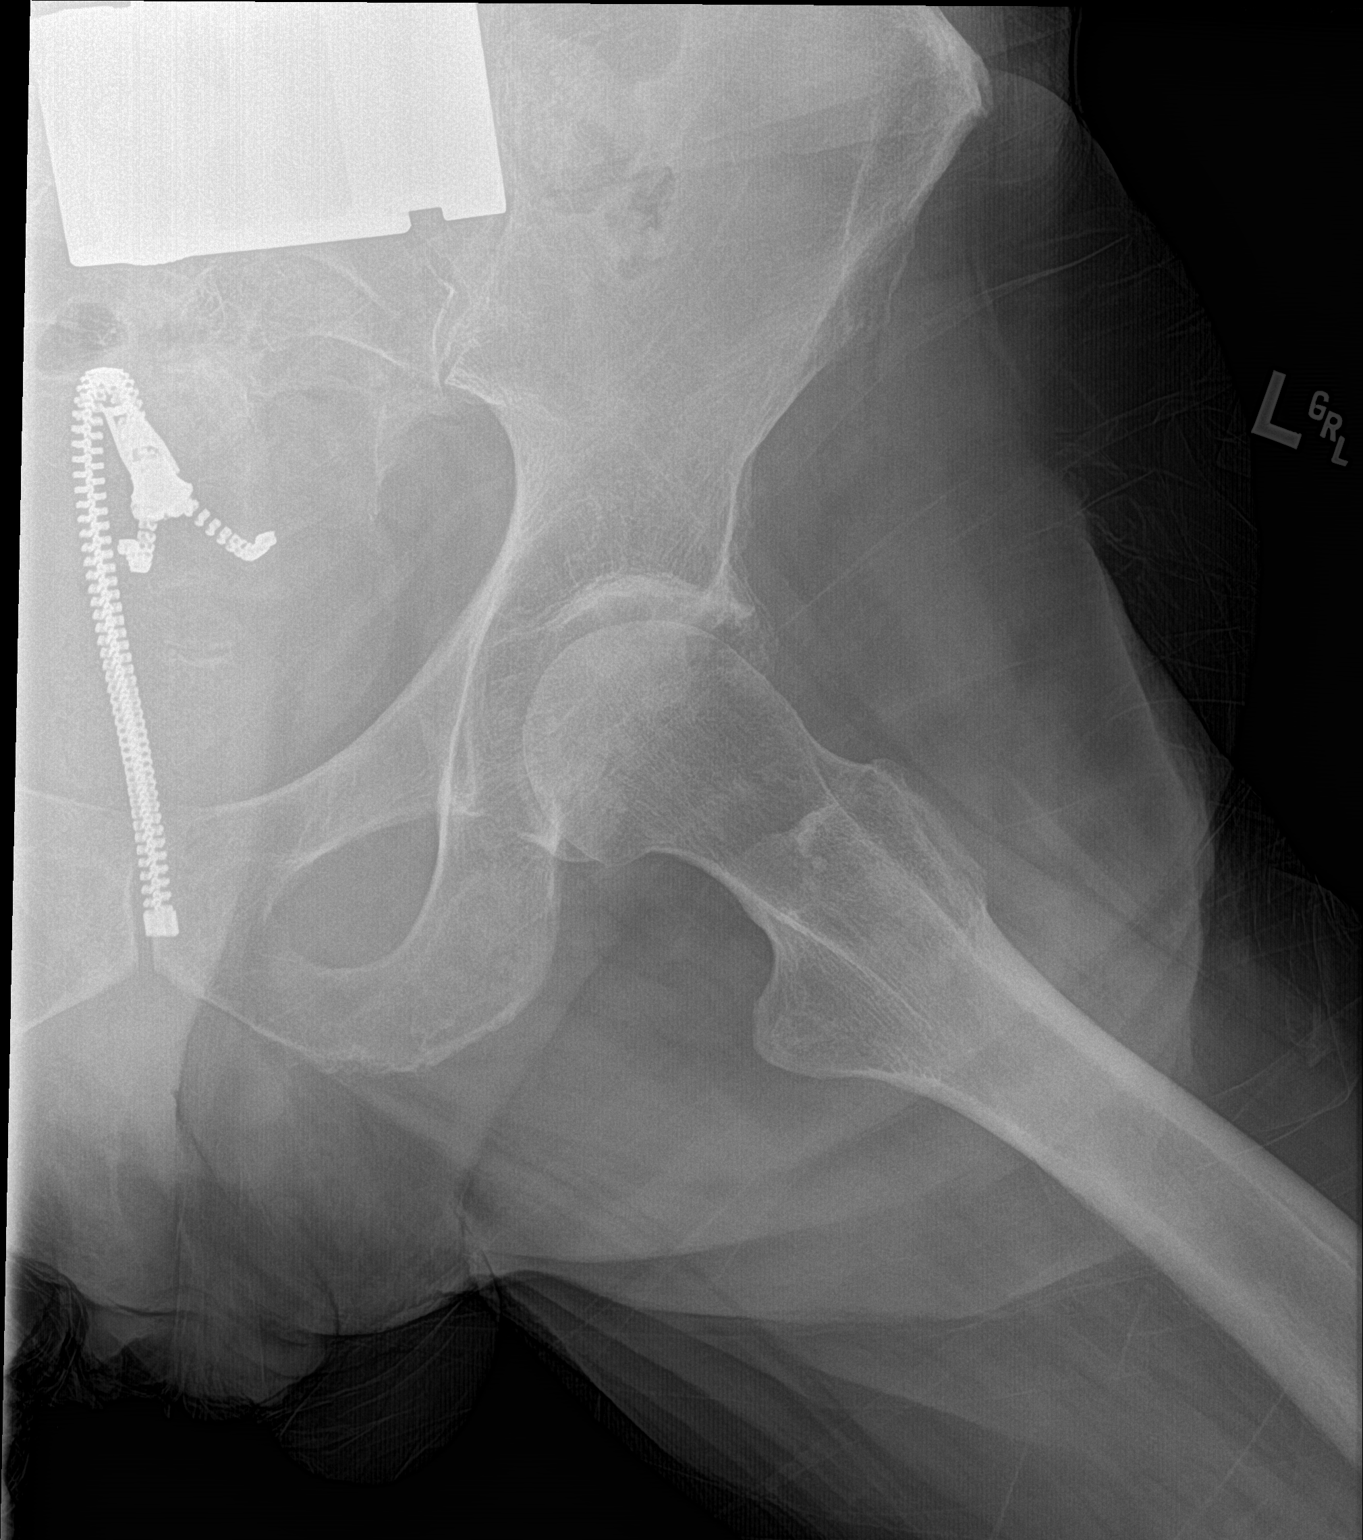

[pelvis ap (2 of 2)]
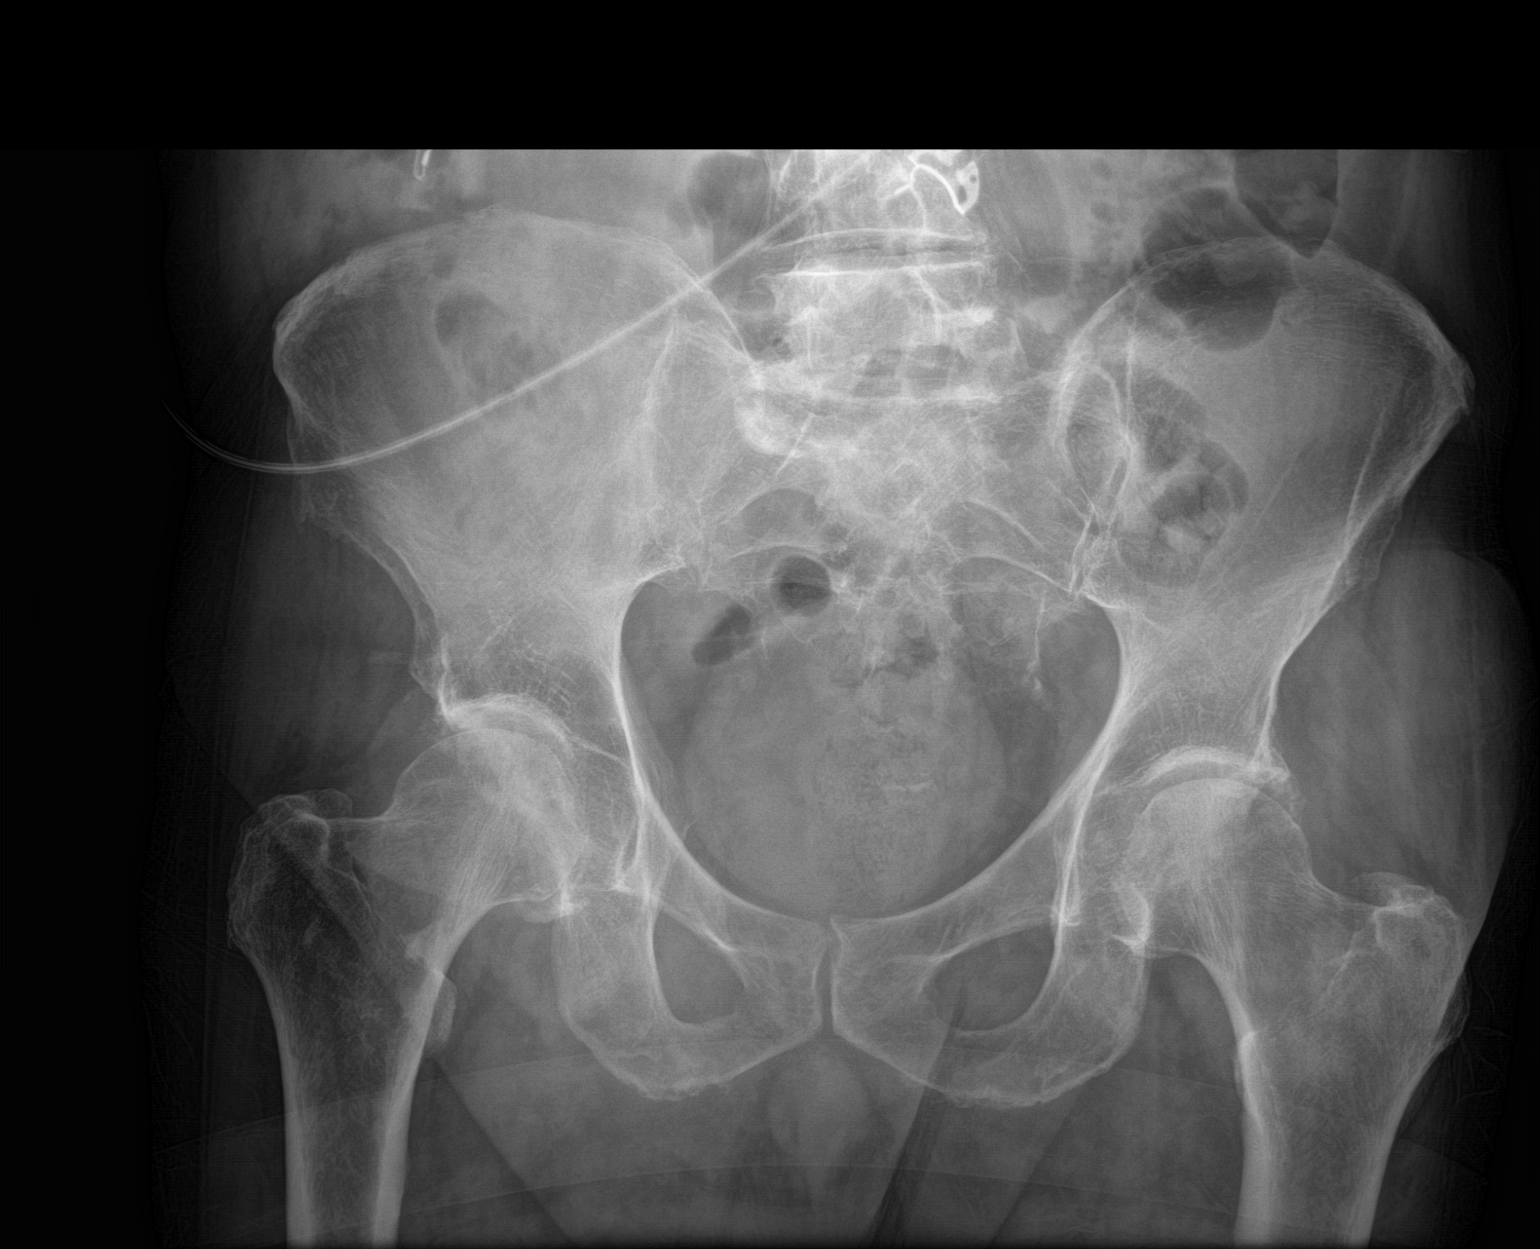

[3 of 3 positions shown; findings below may reference images not displayed]

FINDINGS: Single view radiograph of the pelvis and two view radiograph of the
left hip demonstrates normal alignment. No fracture or dislocation.
There is at least mild left hip degenerative arthritis with
asymmetric joint space narrowing. Vascular calcifications are seen
within the left hemipelvis.
IMPRESSION: No acute fracture or dislocation.

## 2020-04-19 MED ORDER — CARVEDILOL 12.5 MG PO TABS
25.0000 mg | ORAL_TABLET | Freq: Two times a day (BID) | ORAL | Status: DC
  Administered 2020-04-19 – 2020-05-03 (×25): 25 mg via ORAL
  Filled 2020-04-19: qty 2
  Filled 2020-04-19: qty 1
  Filled 2020-04-19: qty 2
  Filled 2020-04-19: qty 4
  Filled 2020-04-19 (×11): qty 2
  Filled 2020-04-19: qty 1
  Filled 2020-04-19 (×4): qty 2
  Filled 2020-04-19: qty 1
  Filled 2020-04-19 (×2): qty 2
  Filled 2020-04-19: qty 1
  Filled 2020-04-19 (×4): qty 2

## 2020-04-19 MED ORDER — WARFARIN SODIUM 5 MG PO TABS
ORAL_TABLET | ORAL | Status: AC
  Administered 2020-04-19: 2.5 mg via ORAL
  Filled 2020-04-19: qty 1

## 2020-04-19 MED ORDER — WARFARIN SODIUM 2.5 MG PO TABS
2.5000 mg | ORAL_TABLET | Freq: Every day | ORAL | Status: DC
  Filled 2020-04-19: qty 1

## 2020-04-19 MED ORDER — PRAVASTATIN SODIUM 40 MG PO TABS
20.0000 mg | ORAL_TABLET | Freq: Every day | ORAL | Status: DC
  Administered 2020-04-20: 20 mg via ORAL
  Filled 2020-04-19: qty 2
  Filled 2020-04-19: qty 1

## 2020-04-19 MED ORDER — LISINOPRIL 20 MG PO TABS
20.0000 mg | ORAL_TABLET | Freq: Every day | ORAL | Status: DC
  Administered 2020-04-19: 20 mg via ORAL
  Filled 2020-04-19: qty 2

## 2020-04-19 NOTE — ED Notes (Signed)
Home medications verified with patient.

## 2020-04-19 NOTE — ED Triage Notes (Addendum)
Transferring from the living room to bedroom with walker and states he fell to floor because of LLE pain no LOC, weakness in Rt hand started today had trouble gripping bottle

## 2020-04-19 NOTE — ED Provider Notes (Signed)
[facility] EMERGENCY DEPARTMENT Provider Note   CSN: [id number] Arrival date & time: 04/19/20  0031   History Chief Complaint  Patient presents with  . Leg Pain  . Weakness    John Lambert is a 79 y.o. male.  The history is provided by the patient.  Leg Pain Weakness He has history of hypertension, pulmonary embolism and DVT anticoagulated on warfarin and comes in because of weakness.  This morning, he initially had some difficulty getting out of bed, but was able to ambulate with a Rollator.  This evening, he tried to get up off of the sofa and his legs gave out on him.  Is complaining of pain in his left upper leg.  He also has noted some difficulty using his right hand and thinks the last known normal would have been sometime this morning.  He denies any head injury.  He denies fever or chills.  Past Medical History:  Diagnosis Date  . Blood clotting disorder ([place])   . Hypertension     There are no problems to display for this patient.   Past Surgical History:  Procedure Laterality Date  . SKIN BIOPSY         History reviewed. No pertinent family history.  Social History   Tobacco Use  . Smoking status: Former Research scientist (life sciences)  . Smokeless tobacco: Never Used  Vaping Use  . Vaping Use: Never used  Substance Use Topics  . Alcohol use: Yes    Alcohol/week: 2.0 standard drinks    Types: 2 Glasses of wine per week  . Drug use: Never    Home Medications Prior to Admission medications   Medication Sig Start Date End Date Taking? Authorizing Provider  carvedilol (COREG) 25 MG tablet Take 25 mg by mouth 2 (two) times daily with a meal.    [provider]  lisinopril (ZESTRIL) 20 MG tablet Take 20 mg by mouth daily.    [provider]  pravastatin (PRAVACHOL) 20 MG tablet Take 20 mg by mouth daily.    [provider]  warfarin (COUMADIN) 2.5 MG tablet Take 2.5 mg by mouth daily.    [provider]    Allergies    Hctz  [hydrochlorothiazide], Hydrocodone, and Vicodin [hydrocodone-acetaminophen]  Review of Systems   Review of Systems  Neurological: Positive for weakness.  All other systems reviewed and are negative.   Physical Exam Updated Vital Signs BP 140/83   Pulse 88   Temp 98.4 F (36.9 C) (Oral)   Resp 16   Ht 6\' 2"  (1.88 m)   Wt 77.1 kg   SpO2 93%   BMI 21.83 kg/m   Physical Exam Vitals and nursing note reviewed.   79 year old male, resting comfortably and in no acute distress. Vital signs are normal. Oxygen saturation is 93%, which is normal. Head is normocephalic and atraumatic. PERRLA, EOMI. Oropharynx is clear. Neck is nontender and supple without adenopathy or JVD. Back is nontender and there is no CVA tenderness. Lungs are clear without rales, wheezes, or rhonchi. Chest is nontender. Heart has regular rate and rhythm without murmur. Abdomen is soft, flat, nontender without masses or hepatosplenomegaly and peristalsis is normoactive. Extremities have no cyanosis or edema.  There is no swelling or deformity.  There is no tenderness palpation in the left hip or femur but there is pain with hip flexion.  There is no pain with internal and external rotation.  Full range of motion of all other joints without  pain. Skin is warm and dry without rash. Neurologic: Mental status is normal, cranial nerves are intact.  Strength is 5/5 in right arm, left arm, right leg.  There is no pronator drift.  He is unable to raise his left leg off of the bed, but strength is 5/5 in knee extension, knee flexion, dorsiflexion, plantarflexion.  ED Results / Procedures / Treatments   Labs (all labs ordered are listed, but only abnormal results are displayed) Labs Reviewed  PROTIME-INR - Abnormal; Notable for the following components:      Result Value   Prothrombin Time 23.9 (*)    INR 2.2 (*)    All other components within normal limits  CBC WITH DIFFERENTIAL/PLATELET - Abnormal; Notable for the  following components:   WBC 10.8 (*)    Neutro Abs 8.4 (*)    All other components within normal limits  COMPREHENSIVE METABOLIC PANEL - Abnormal; Notable for the following components:   Glucose, Bld 110 (*)    All other components within normal limits  URINALYSIS, ROUTINE W REFLEX MICROSCOPIC - Abnormal; Notable for the following components:   Specific Gravity, Urine >1.030 (*)    Ketones, ur 15 (*)    All other components within normal limits  RESP PANEL BY RT-PCR (FLU A&B, COVID) [id number]   Radiology CT Hip Left Wo Contrast  Result Date: 04/19/2020 CLINICAL DATA:  Hip pain. EXAM: CT OF THE LEFT HIP WITHOUT CONTRAST TECHNIQUE: Multidetector CT imaging of the left hip was performed according to the standard protocol. Multiplanar CT image reconstructions were also generated. COMPARISON:  X-ray earlier same day FINDINGS: Bones/Joint/Cartilage No evidence for an acute fracture in the visualized left hemipelvis or proximal left femur. Specifically, no evidence for femoral neck fracture. No disruption of the tensile or compressive trabeculated. Patient is noted to have degenerative changes in the acetabulum at least 2 small ossified intra-articular loose bodies in the joint space of the left hip. No substantial joint effusion. Ligaments Suboptimally assessed by CT. Muscles and Tendons No intramuscular or subcutaneous hemorrhage/hematoma. Soft tissues Unremarkable. IMPRESSION: 1. No evidence for acute fracture in the visualized left hemipelvis or proximal left femur. Specifically, no evidence for femoral neck fracture. 2. Degenerative changes in the left hip with at least 2 small ossified intra-articular loose bodies in the joint space of the left hip. Electronically Signed   By: Misty Stanley M.D.   On: 04/19/2020 05:16   DG Hip Unilat W or Wo Pelvis 2-3 Views Left  Result Date: 04/19/2020 CLINICAL DATA:  Fall, left hip pain EXAM: DG HIP (WITH OR WITHOUT PELVIS) 2-3V LEFT COMPARISON:  None. FINDINGS:  Single view radiograph of the pelvis and two view radiograph of the left hip demonstrates normal alignment. No fracture or dislocation. There is at least mild left hip degenerative arthritis with asymmetric joint space narrowing. Vascular calcifications are seen within the left hemipelvis. IMPRESSION: No acute fracture or dislocation. Electronically Signed   By: Fidela Salisbury MD   On: 04/19/2020 02:57   DG FEMUR MIN 2 VIEWS LEFT  Result Date: 04/19/2020 CLINICAL DATA:  Fall, pain EXAM: LEFT FEMUR 2 VIEWS COMPARISON:  None. FINDINGS: No acute bony abnormality. Specifically, no fracture, subluxation, or dislocation. Joint spaces maintained. IMPRESSION: No acute bony abnormality. Electronically Signed   By: Rolm Baptise M.D.   On: 04/19/2020 02:57    Procedures Procedures  Medications Ordered in ED Medications - No data to display  ED Course  I have reviewed the triage vital signs and  the nursing notes.  Pertinent labs & imaging results that were available during my care of the patient were reviewed by me and considered in my medical decision making (see chart for details).  MDM Rules/Calculators/A&P Weakness of uncertain cause.  Will check screening labs to look for electrolyte disturbance, occult UTI.  With left upper leg pain, will check x-rays.  Exam is not consistent with stroke.  Old records are reviewed, and he has no relevant past visits.  Labs are unremarkable.  INR is therapeutic.  Left hip and femur x-rays showed no evidence of fracture.  Given his pain with movement of the left hip, he was sent for CT of the hip which shows no evidence of fracture although intra-articular loose bodies are noted.  Attempt was made to ambulate him, but he is very unstable with ambulation and he is not safe to go home.  This is a significant change for him.  Therefore, we will plan to admit.  Case is discussed with Dr. Marlowe Sax of [facility], who agrees to admit the patient.  Final Clinical  Impression(s) / ED Diagnoses Final diagnoses:  Weakness  Fall at home, initial encounter  Anticoagulated on warfarin    Rx / DC Orders ED Discharge Orders    None       Delora Fuel, MD 04/59/97 [phone]

## 2020-04-19 NOTE — ED Notes (Addendum)
Attempted to ambulate patient with walker he uses at home. Much assistance was required by this RN and it is unsafe to send patient home at this time. Pt assisted back into bed. Dr. Roxanne Mins informed.

## 2020-04-19 NOTE — ED Notes (Signed)
Patient transported to CT °

## 2020-04-20 ENCOUNTER — Observation Stay (HOSPITAL_COMMUNITY): Payer: Medicare Other

## 2020-04-20 ENCOUNTER — Observation Stay (HOSPITAL_BASED_OUTPATIENT_CLINIC_OR_DEPARTMENT_OTHER): Payer: Medicare Other

## 2020-04-20 ENCOUNTER — Encounter (HOSPITAL_COMMUNITY): Payer: Self-pay | Admitting: Internal Medicine

## 2020-04-20 DIAGNOSIS — R262 Difficulty in walking, not elsewhere classified: Secondary | ICD-10-CM

## 2020-04-20 DIAGNOSIS — R531 Weakness: Secondary | ICD-10-CM | POA: Diagnosis not present

## 2020-04-20 DIAGNOSIS — I10 Essential (primary) hypertension: Secondary | ICD-10-CM

## 2020-04-20 DIAGNOSIS — I63512 Cerebral infarction due to unspecified occlusion or stenosis of left middle cerebral artery: Secondary | ICD-10-CM

## 2020-04-20 DIAGNOSIS — I6389 Other cerebral infarction: Secondary | ICD-10-CM

## 2020-04-20 DIAGNOSIS — M7989 Other specified soft tissue disorders: Secondary | ICD-10-CM

## 2020-04-20 DIAGNOSIS — Z7901 Long term (current) use of anticoagulants: Secondary | ICD-10-CM

## 2020-04-20 DIAGNOSIS — D689 Coagulation defect, unspecified: Secondary | ICD-10-CM

## 2020-04-20 DIAGNOSIS — Y92009 Unspecified place in unspecified non-institutional (private) residence as the place of occurrence of the external cause: Secondary | ICD-10-CM

## 2020-04-20 LAB — CBC
HCT: 48.5 % (ref 39.0–52.0)
Hemoglobin: 15.7 g/dL (ref 13.0–17.0)
MCH: 29.6 pg (ref 26.0–34.0)
MCHC: 32.4 g/dL (ref 30.0–36.0)
MCV: 91.3 fL (ref 80.0–100.0)
Platelets: 195 10*3/uL (ref 150–400)
RBC: 5.31 MIL/uL (ref 4.22–5.81)
RDW: 13 % (ref 11.5–15.5)
WBC: 9.9 10*3/uL (ref 4.0–10.5)
nRBC: 0 % (ref 0.0–0.2)

## 2020-04-20 LAB — PROTIME-INR
INR: 2 — ABNORMAL HIGH (ref 0.8–1.2)
Prothrombin Time: 21.8 seconds — ABNORMAL HIGH (ref 11.4–15.2)

## 2020-04-20 LAB — HEMOGLOBIN A1C
Hgb A1c MFr Bld: 5.3 % (ref 4.8–5.6)
Mean Plasma Glucose: 105.41 mg/dL

## 2020-04-20 LAB — BASIC METABOLIC PANEL
Anion gap: 11 (ref 5–15)
BUN: 15 mg/dL (ref 8–23)
CO2: 25 mmol/L (ref 22–32)
Calcium: 9 mg/dL (ref 8.9–10.3)
Chloride: 105 mmol/L (ref 98–111)
Creatinine, Ser: 0.94 mg/dL (ref 0.61–1.24)
GFR, Estimated: >60 mL/min (ref >60)
Glucose, Bld: 95 mg/dL (ref 70–99)
Potassium: 4.6 mmol/L (ref 3.5–5.1)
Sodium: 141 mmol/L (ref 135–145)

## 2020-04-20 LAB — ECHOCARDIOGRAM COMPLETE BUBBLE STUDY
Area-P 1/2: 2.39 cm2
S' Lateral: 1.9 cm

## 2020-04-20 IMAGING — MR MR HEAD W/O CM
6 of 10 series · 29 of 48 positions shown · non-contrast
Comparison: None.

CLINICAL DATA: Acute presentation leg weakness difficulty walking

EXAM:
MRI HEAD WITHOUT CONTRAST
TECHNIQUE: Multiplanar, multiecho pulse sequences of the brain and surrounding
structures were obtained without intravenous contrast.

[Series 2: DWI · axial · 3.0mm · 0.94mm/px · z∈[-83,+68]mm · 11 of 104 slices shown (1 of 2)]
[im 1/104]
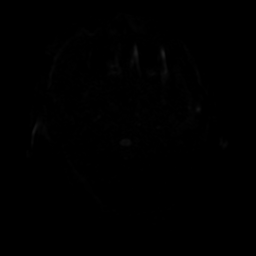
[im 11/104]
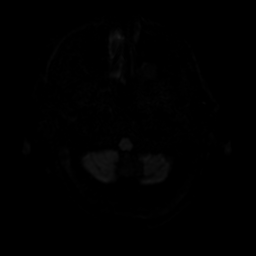
[im 21/104]
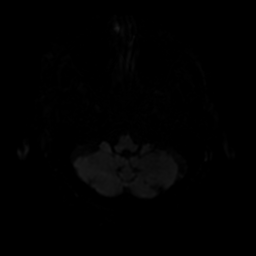
[im 31/104]
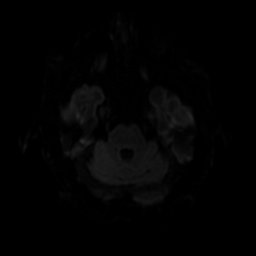
[im 42/104]
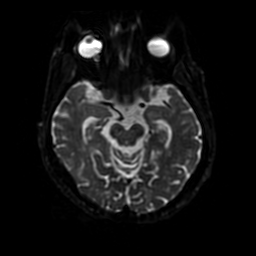
[im 52/104]
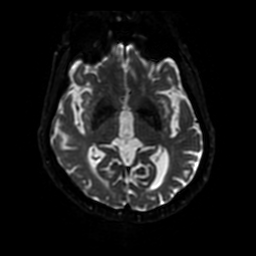
[im 62/104]
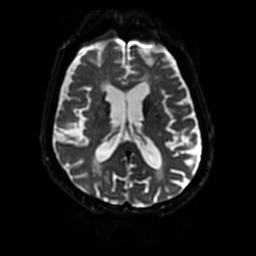
[im 73/104]
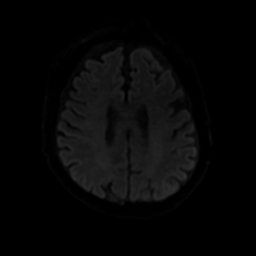
[im 83/104]
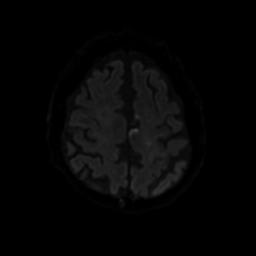
[im 93/104]
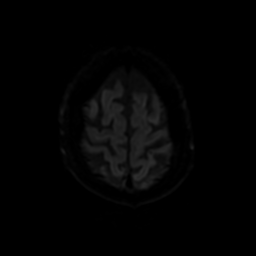
[im 104/104]
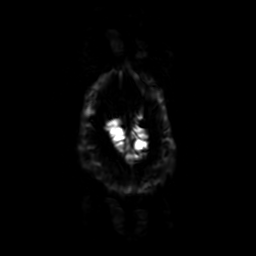

[Series 3: DWI · coronal · 4.0mm · 0.94mm/px · 6 of 70 slices shown (2 of 2)]
[im 1/70]
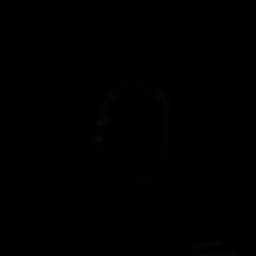
[im 14/70]
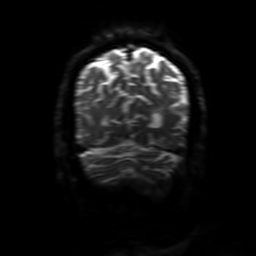
[im 28/70]
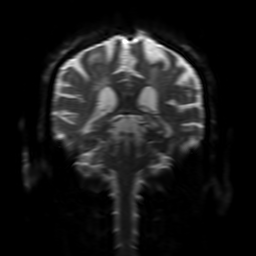
[im 42/70]
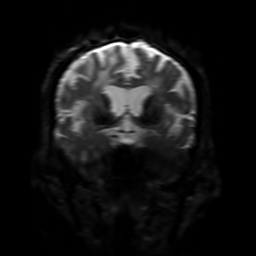
[im 56/70]
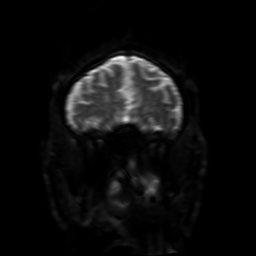
[im 70/70]
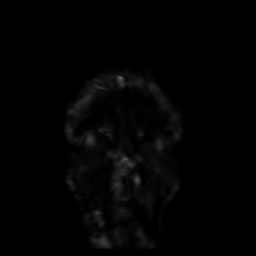

[Series 4: FLAIR · sagittal · 5.0mm · 0.23mm/px · 2 of 25 slices shown (1 of 2)]
[im 1/25]
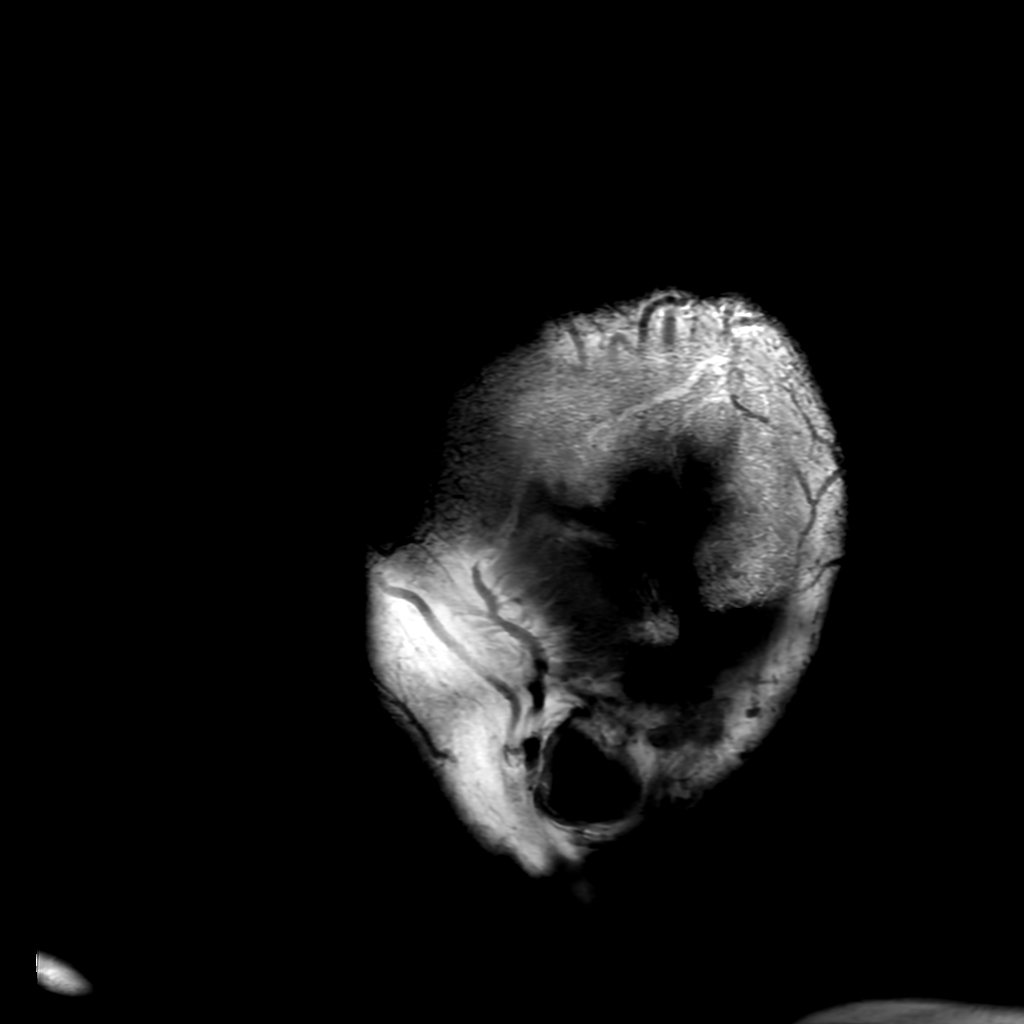
[im 25/25]
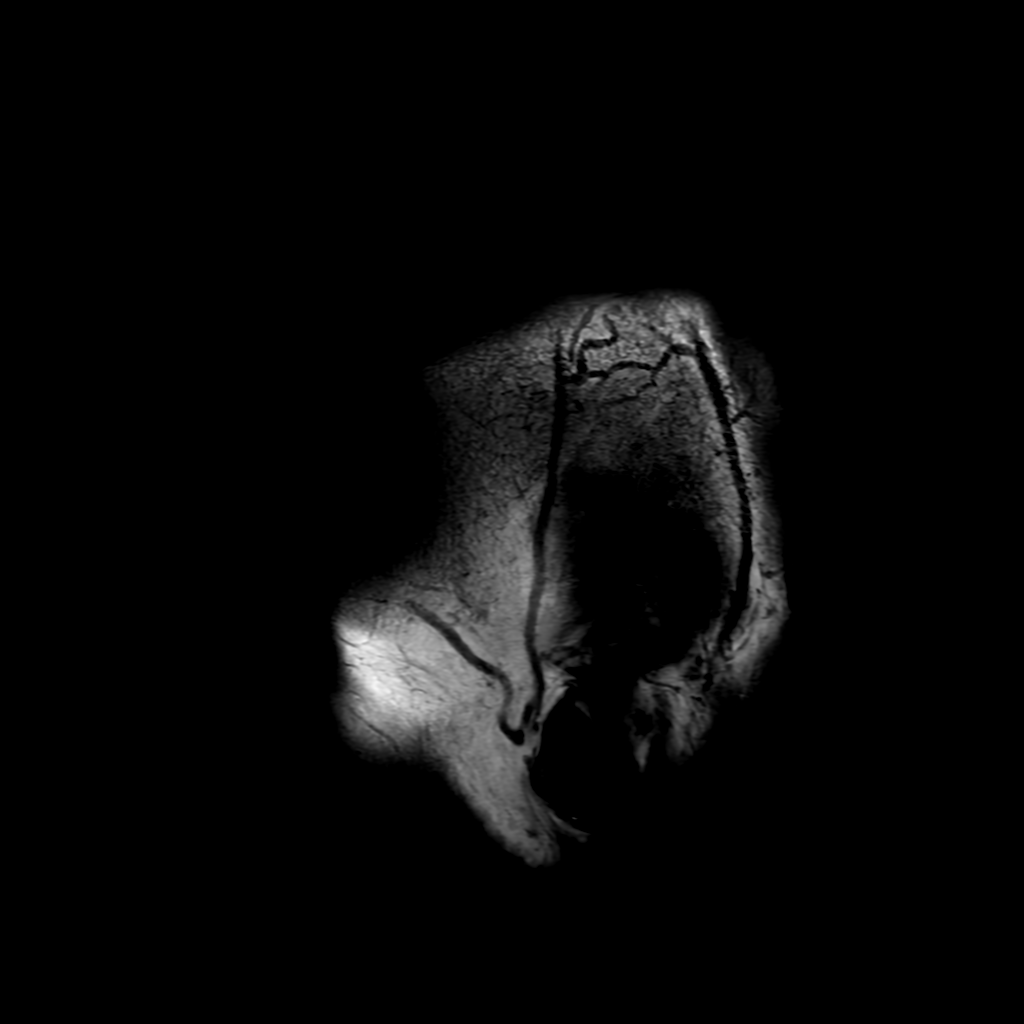

[Series 6: FLAIR · axial · 3.0mm · 0.45mm/px · z∈[-77,+71]mm · 2 of 26 slices shown (2 of 2)]
[im 1/26]
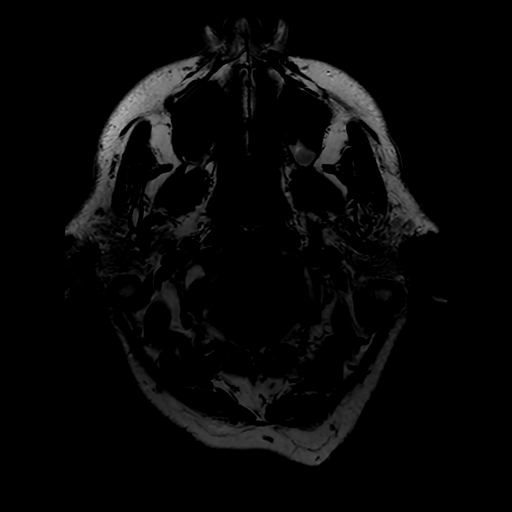
[im 26/26]
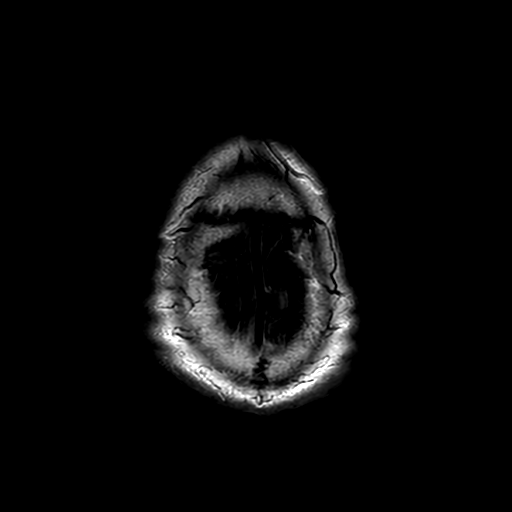

[Series 250: ADC · axial · 3.0mm · 0.94mm/px · z∈[-83,+68]mm · 5 of 52 slices shown (1 of 2)]
[im 1/52]
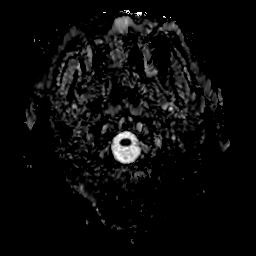
[im 13/52]
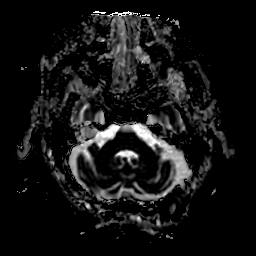
[im 26/52]
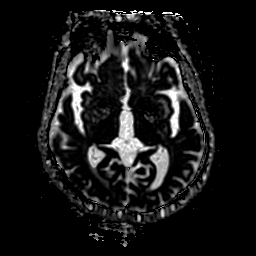
[im 39/52]
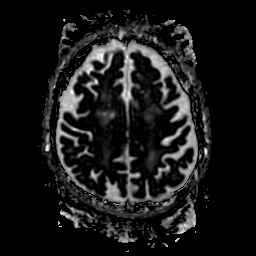
[im 52/52]
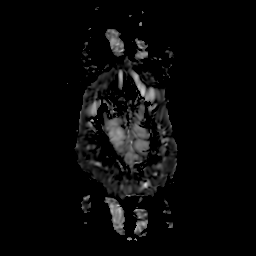

[Series 350: ADC · coronal · 4.0mm · 0.94mm/px · 3 of 35 slices shown (2 of 2)]
[im 1/35]
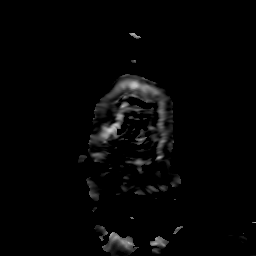
[im 18/35]
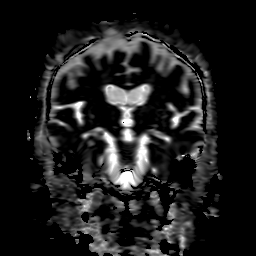
[im 35/35]
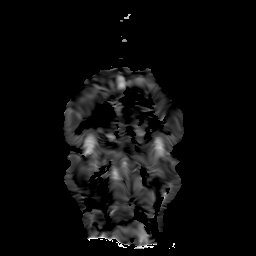

[29 of 48 positions shown; findings below may reference images not displayed]

FINDINGS: Brain: There are numerous scattered punctate acute infarctions
affecting the left posterior frontal region, none larger than 1 cm,
consistent with a cluster of micro embolic infarctions which could
either be in the anterior or middle cerebral artery territory.
Punctate acute infarction also affecting the splenium of the corpus
callosum. No acute infarction in the right hemisphere, brainstem or
cerebellum. Old small vessel infarctions affect the cerebellum and
the pons. Chronic small-vessel ischemic changes present throughout
the cerebral hemispheric white matter. Old cortical and subcortical
infarctions at the left parietal vertex. Old infarctions of the
corpus callosum. No hydrocephalus, mass or extra-axial collection.
No swelling or acute hemorrhage.

Vascular: Major vessels at the base of the brain show flow.

Skull and upper cervical spine: Negative

Sinuses/Orbits: Clear/normal

Other: None
IMPRESSION: 1. Numerous scattered punctate acute infarctions in the left
posterior frontal region consistent with micro embolic infarctions
which could either be in the anterior or middle cerebral artery
territory. Punctate acute infarction also affecting the splenium of
the corpus callosum.
2. Extensive chronic small-vessel ischemic changes elsewhere
throughout the brain as outlined above.
3. Small old left parietal vertex cortical and subcortical
infarctions.

## 2020-04-20 IMAGING — CT CT ANGIO NECK
2 of 7 series · 8 of 33 positions shown · IV contrast (APPLIED)
Comparison: MRI head from the same day.

CLINICAL DATA: Stroke/TIA.



[Series 5: cta neck/head · axial · 0.64mm/px · z∈[+1138,+1266]mm · 2 of 194 slices shown]
[im 65/194  soft-tissue]
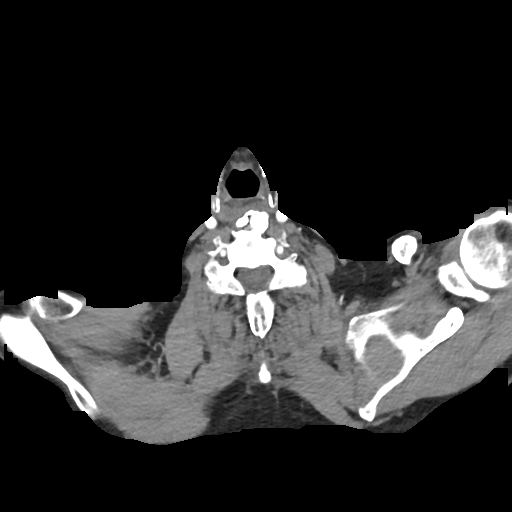
[im 129/194  soft-tissue]
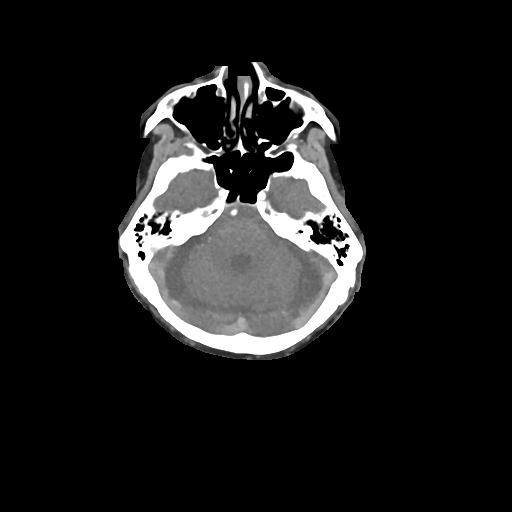

[Series 7: ax thins · axial · 0.47mm/px · z∈[+1065,+1337]mm · 6 of 382 slices shown]
[im 55/382  soft-tissue]
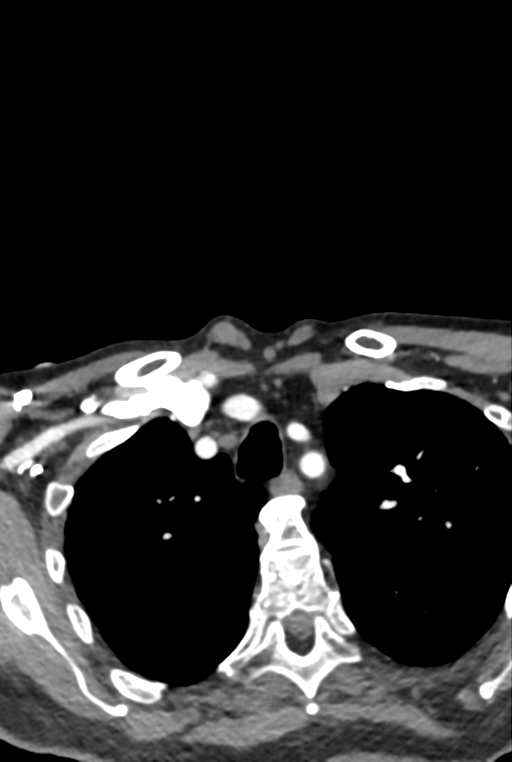
[im 109/382  bone]
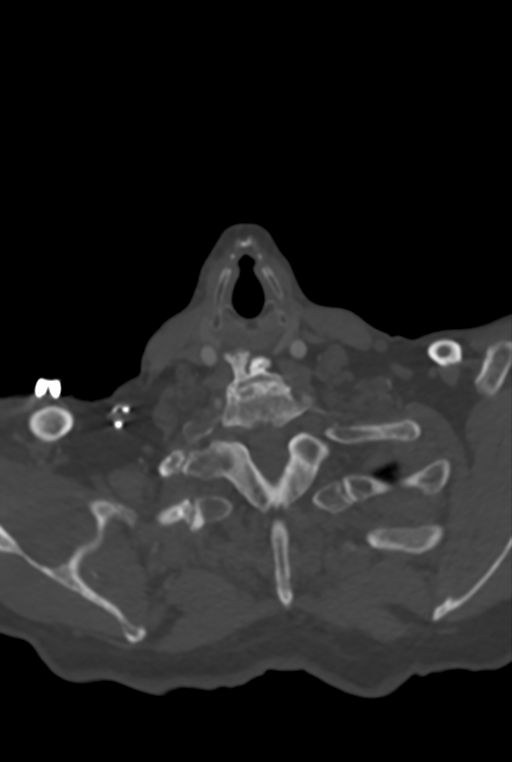
[im 164/382  soft-tissue]
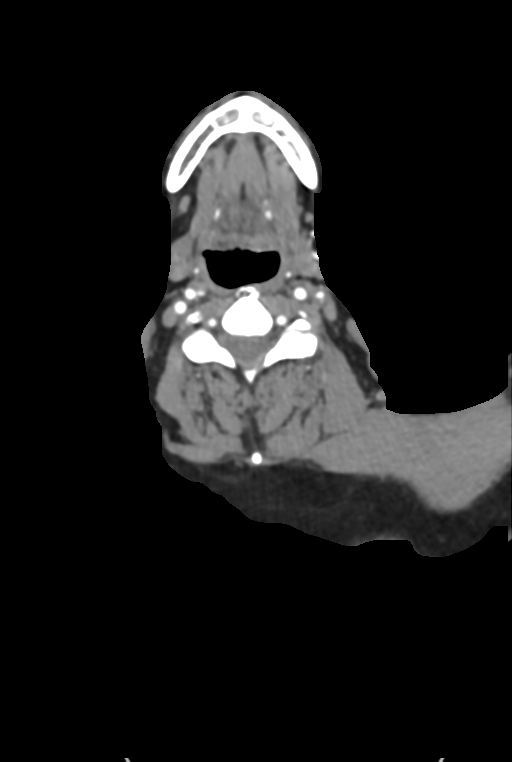
[im 218/382  bone]
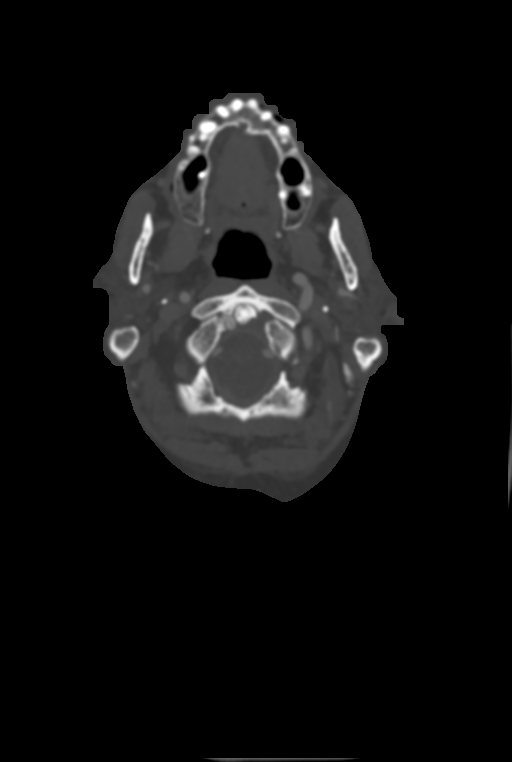
[im 273/382  soft-tissue]
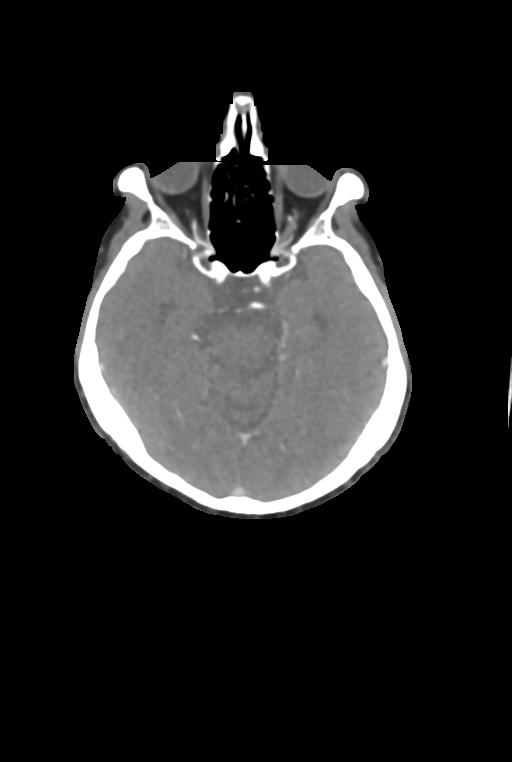
[im 327/382  bone]
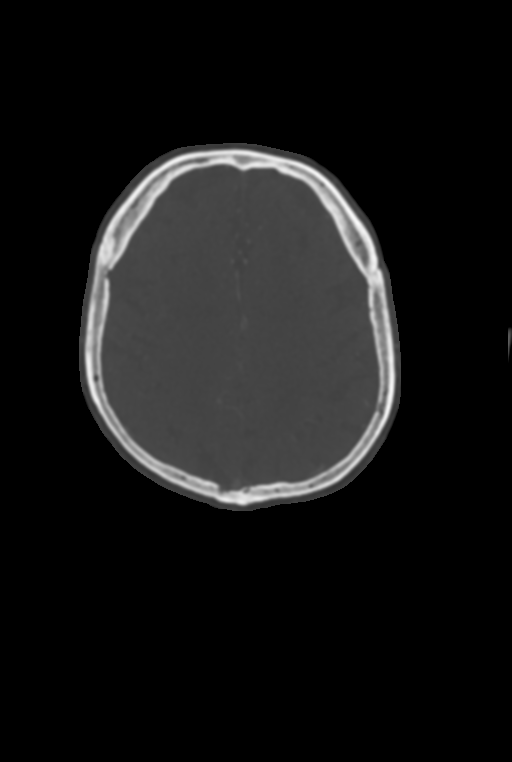

[8 of 33 positions shown; findings below may reference images not displayed]

FINDINGS: CTA NECK FINDINGS

Aortic arch: Great vessel origins are patent.

Right carotid system: No evidence of dissection, stenosis (50% or
greater) or occlusion.

Left carotid system: Ulcerated atherosclerosis at the carotid
bifurcation with resulting severe (approximately 80%) stenosis of
the proximal internal carotid artery.

Vertebral arteries: Mildly left dominant. No evidence of dissection,
stenosis (50% or greater) or occlusion.

Skeleton: Multilevel degenerative changes of the spine. Flowing
anterior disc osteophytes, compatible with diffuse idiopathic
skeletal hyperostosis. No evidence of acute abnormality.

Other neck: No mass or suspicious adenopathy.

Upper chest: Partially imaged ground-glass opacities in the right
upper lung.

Review of the MIP images confirms the above findings

CTA HEAD FINDINGS

Anterior circulation: No significant stenosis, proximal occlusion,
aneurysm, or vascular malformation. Absent right A1 ACA with the
right A2 ACA rising from the dominant left A1 ACA, anatomic variant.

Posterior circulation: Moderate stenosis of the intradural left
vertebral artery. Right fetal type PCA with small right P1 PCA.

Venous sinuses: As permitted by contrast timing, patent.

Review of the MIP images confirms the above findings
IMPRESSION: 1. Ulcerated atherosclerosis at the left carotid bifurcation with
resulting severe (approximately 80%) stenosis of the proximal
internal carotid artery.
2. Moderate stenosis of the intradural left vertebral artery.
3. Intracranially, no evidence of large vessel occlusion or
hemodynamically significant proximal stenosis.
4. Partially imaged ground-glass opacities in the right upper lung,
which may be infectious or inflammatory. Recommend dedicated chest
imaging to further evaluate.

## 2020-04-20 IMAGING — MR MR LUMBAR SPINE W/O CM
4 of 5 series · 18 of 48 positions shown · non-contrast
Comparison: Hip CT yesterday.

CLINICAL DATA: 79-year-old male with history of neurofibroma
removed from spine. Acute foot drag. Clotting disorder. Weakness in
legs, difficulty walking.

EXAM:
MRI LUMBAR SPINE WITHOUT CONTRAST
TECHNIQUE: Multiplanar, multisequence MR imaging of the lumbar spine was
performed. No intravenous contrast was administered.

[Series 3: T2 · sagittal · 4.0mm · 0.55mm/px · 6 of 15 slices shown (1 of 2)]
[im 1/15]
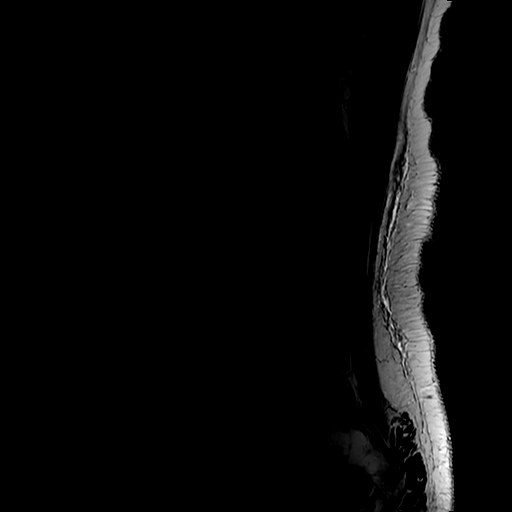
[im 3/15]
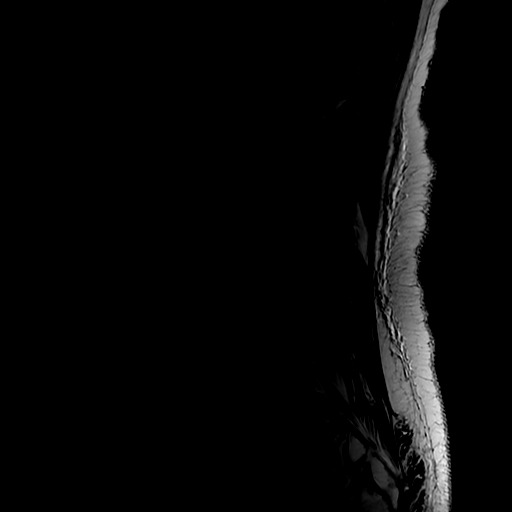
[im 6/15]
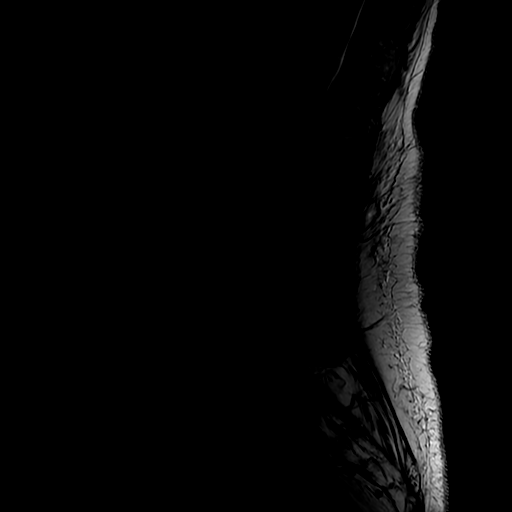
[im 9/15]
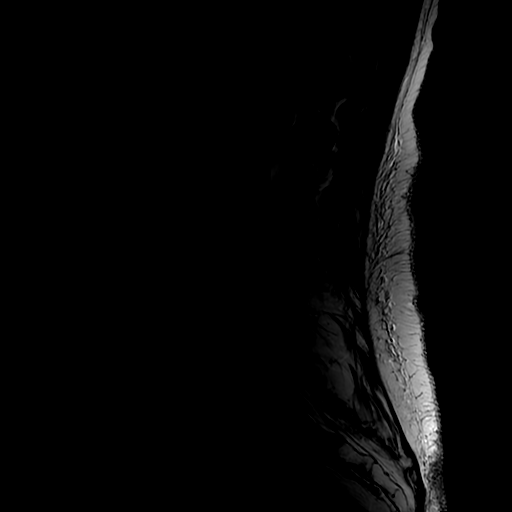
[im 12/15]
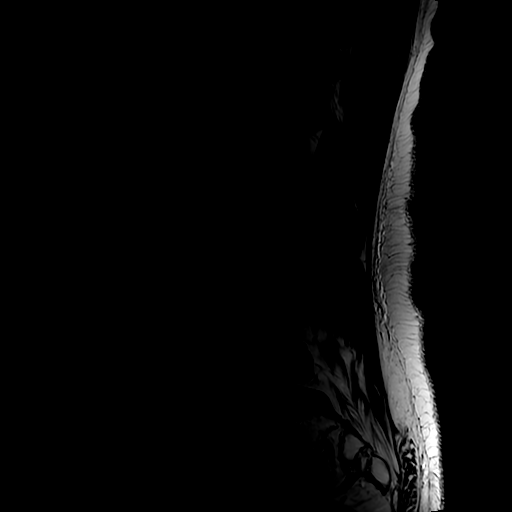
[im 15/15]
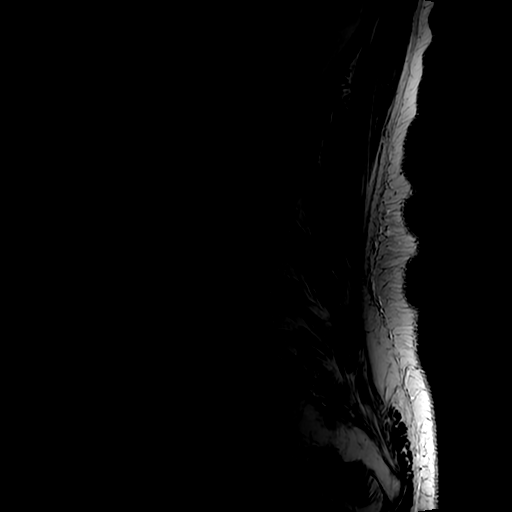

[Series 5: T1 · sagittal · 4.0mm · 0.55mm/px · 3 of 15 slices shown (1 of 2)]
[im 3/15]
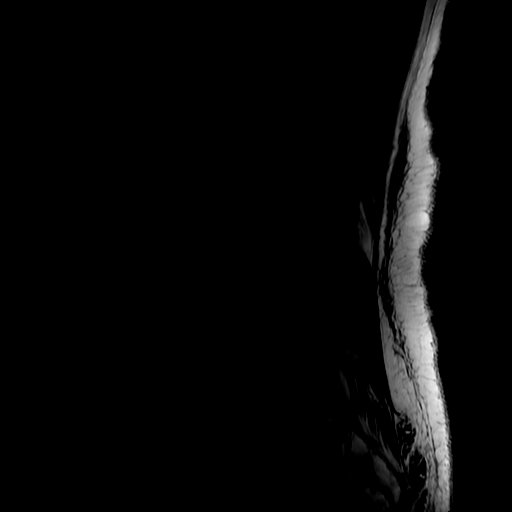
[im 9/15]
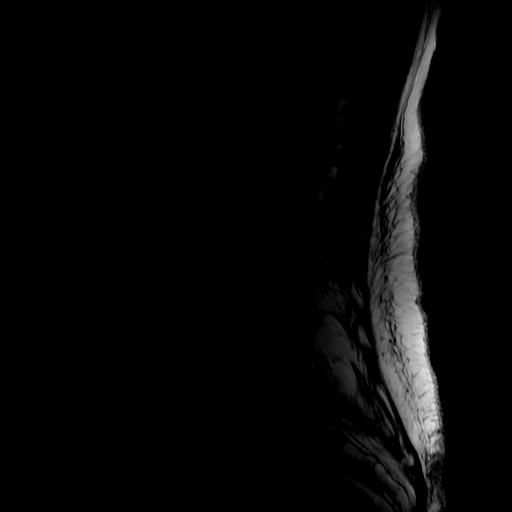
[im 15/15]
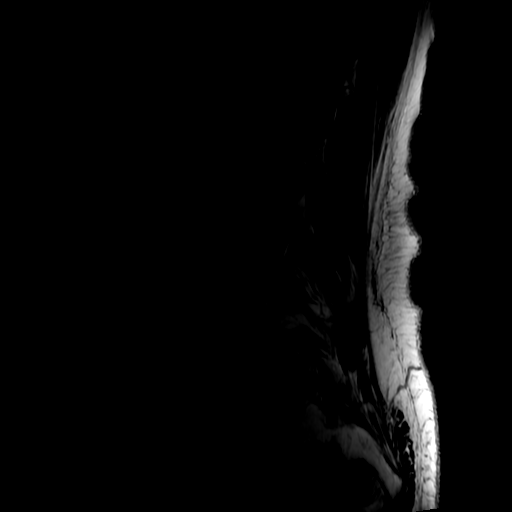

[Series 6: T2 · axial · 4.0mm · 0.39mm/px · z∈[-45,+134]mm · 6 of 35 slices shown (2 of 2)]
[im 1/35]
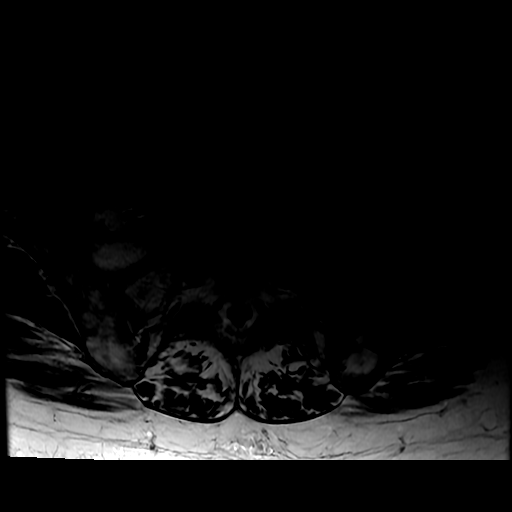
[im 5/35]
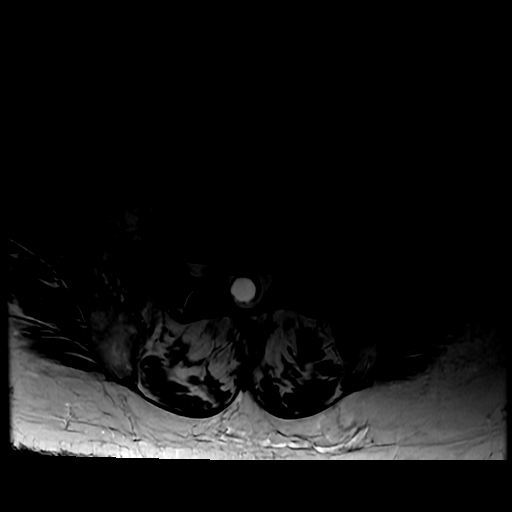
[im 10/35]
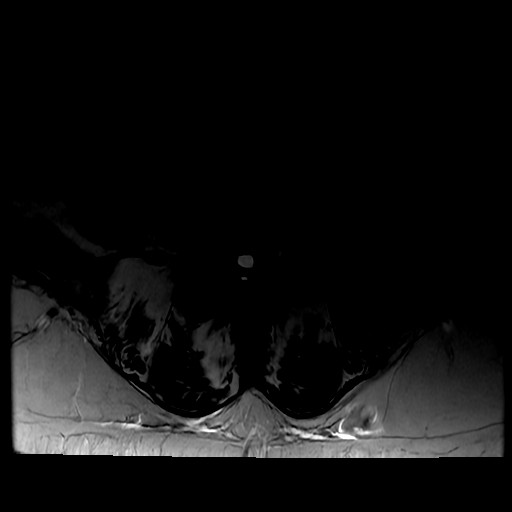
[im 15/35]
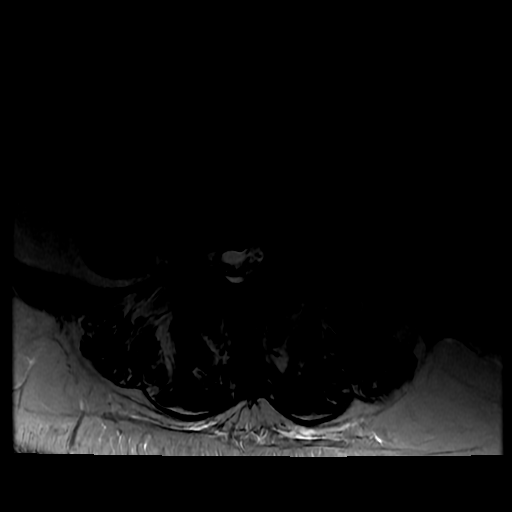
[im 18/35]
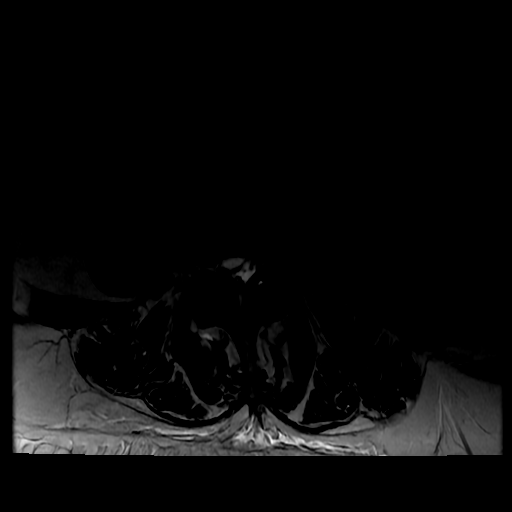
[im 30/35]
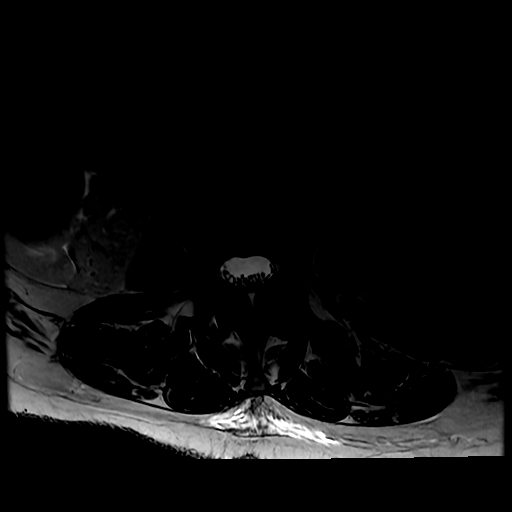

[Series 7: T1 · axial · 4.0mm · 0.39mm/px · z∈[-25,+134]mm · 3 of 35 slices shown (2 of 2)]
[im 5/35]
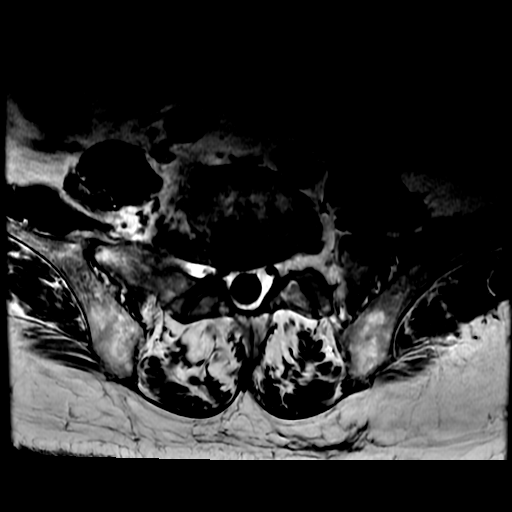
[im 18/35]
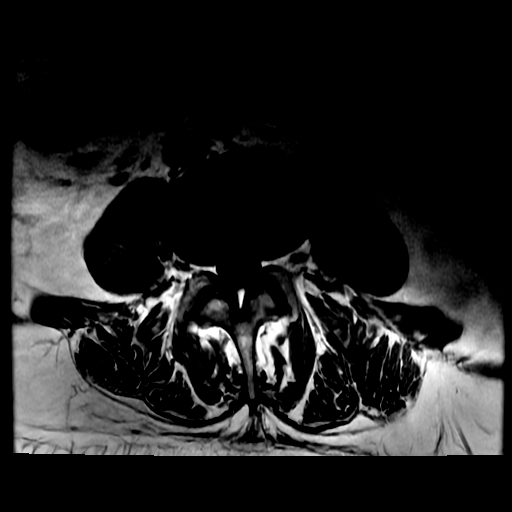
[im 30/35]
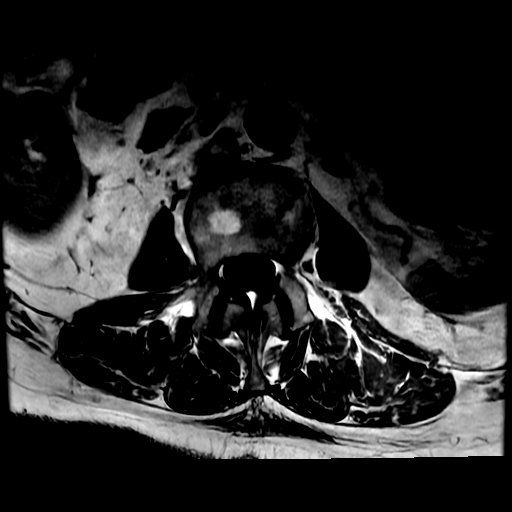

[18 of 48 positions shown; findings below may reference images not displayed]

FINDINGS: Segmentation: Lumbar segmentation appears to be normal and will be
designated as such for this report.

Alignment: Preserved, somewhat exaggerated lumbar lordosis. No
spondylolisthesis.

Vertebrae: No marrow edema or evidence of acute osseous abnormality.
Background bone marrow signal mildly heterogeneous but within normal
limits. Visible sacrum and SI joints appear intact.

Conus medullaris and cauda equina: Conus extends to the L1-L2 level.
No lower spinal cord or conus signal abnormality. Beginning just
below the L2-L3 disc level there is architectural distortion within
the thecal sac with adhesions and clumping of nerve roots (series 6,
image 18) which continues to the sacrum compatible with chronic
arachnoiditis. No evidence of intrathecal mass.

Paraspinal and other soft tissues: Tortuous abdominal aorta.
Nonspecific mild bilateral perinephric fluid. Lumbar paraspinal soft
tissues are within normal limits.

Disc levels:

Generally mild for age lumbar spine degeneration. No spinal stenosis
above L4-L5.

L4-L5: Circumferential disc bulge and endplate spurring with
moderate posterior element hypertrophy. Arachnoiditis with distorted
cauda equina. Mild to moderate spinal stenosis. Mild right L4
foraminal stenosis.

L5-S1: Disc space loss with bulky right anterolateral disc
osteophyte complex. Mild facet hypertrophy. Arachnoiditis. No spinal
stenosis. There is mild to moderate right lateral recess stenosis
(right S1 nerve level series 3, image 7). Mild to moderate right L5
foraminal stenosis.
IMPRESSION: 1. Chronic arachnoiditis with adhesions in the thecal sac and
distorted cauda equina nerve roots from L2-L3 to the sacrum. No
recurrent spinal tumor is evident.

2. Superimposed lumbar spine degeneration primarily at L4-L5 and
L5-S1.
L4-L5 multifactorial mild to moderate multifactorial spinal
stenosis, mild right L4 neural foraminal stenosis.
L5-S1 moderate right lateral recess and foraminal stenosis. Query
Right L5 and/or S1 radiculitis.

## 2020-04-20 MED ORDER — WARFARIN - PHYSICIAN DOSING INPATIENT: Freq: Every day | Status: DC

## 2020-04-20 MED ORDER — IOHEXOL 350 MG/ML SOLN
80.0000 mL | Freq: Once | INTRAVENOUS | Status: AC | PRN
  Administered 2020-04-20: 80 mL via INTRAVENOUS

## 2020-04-20 MED ORDER — ACETAMINOPHEN 500 MG PO TABS
500.0000 mg | ORAL_TABLET | Freq: Four times a day (QID) | ORAL | Status: DC | PRN
  Administered 2020-04-21 – 2020-04-26 (×5): 500 mg via ORAL
  Filled 2020-04-20 (×5): qty 1

## 2020-04-20 MED ORDER — WARFARIN SODIUM 2.5 MG PO TABS
2.5000 mg | ORAL_TABLET | Freq: Every day | ORAL | Status: DC
  Administered 2020-04-20 – 2020-04-22 (×3): 2.5 mg via ORAL
  Filled 2020-04-20 (×3): qty 1

## 2020-04-20 NOTE — Progress Notes (Signed)
Lower extremity venous bilateral study completed.  Preliminary results relayed to Dahal, MD.   See CV Proc for preliminary results report.   Darlin Coco, RDMS

## 2020-04-20 NOTE — Consult Note (Addendum)
Hospital Consult    Reason for Consult:  Symptomatic carotid artery stenosis Requesting Physician:  Dahal MRN #:  [id number]  History of Present Illness: This is a 79 y.o. male who came in to the hospital with bilateral lower extremity weakness with the right worse than left.  He states that he went to get up Monday morning and had BLE weakness.  This lasted about 30 minutes.  He had another episode later in the evening and his wife called 911.  Upon further discussion, pt did have some right hand weakness.  He states he was not clumsy, just weak.  He states he still has some weakness in the right hand.  He denies any temporary blindness or speech difficulties.   He has never had any of these symptoms in the past.  He underwent carotid duplex and was found to have 80-99% left ICA stenosis.  Vascular surgery was consulted.   He mother had a phlebitis in the past.  He states that in 1990, he was found to have a PE and was on coumadin for 6 months.  He was found to have a DVT in 2010 and was placed back on coumadin.  He has a genetic clotting disorder.  His DVT was in his right leg and his melanoma was also removed from his right leg.   He smoked as a teenager for about 3 years.  He states they have a glass of wine about every other week.   The pt is on a statin for cholesterol management.  The pt is not on a daily aspirin.   Other AC:  coumadin The pt is on BB, ACEI for hypertension.   The pt is not diabetic.    There is not family hx of stroke or AAA.  He and his wife have been married for 12 years.    Past Medical History:  Diagnosis Date  . Blood clotting disorder ([place])   . Blood dyscrasia   . Cancer ([place])    melanoma removed from right calf  . Hypertension   . Neuromuscular disorder ([place])    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    Past Surgical History:  Procedure Laterality Date  . SKIN BIOPSY      Allergies  Allergen Reactions  . Hctz [Hydrochlorothiazide] Other (See  Comments)    dizzy  . Hydrocodone Itching  . Vicodin [Hydrocodone-Acetaminophen] Itching    Prior to Admission medications   Medication Sig Start Date End Date Taking? Authorizing Provider  acetaminophen (TYLENOL) 500 MG tablet Take 500 mg by mouth every 6 (six) hours as needed for mild pain.   Yes [provider]  carvedilol (COREG) 25 MG tablet Take 25 mg by mouth 2 (two) times daily with a meal.   Yes [provider]  lisinopril (ZESTRIL) 20 MG tablet Take 20 mg by mouth daily.   Yes [provider]  pravastatin (PRAVACHOL) 20 MG tablet Take 20 mg by mouth daily.   Yes [provider]  warfarin (COUMADIN) 2.5 MG tablet Take 1.25-2.5 mg by mouth See admin instructions. Take 1 tablet (2.5 MG) daily except on Mondays take 1/2 tablet (1.25 MG)   Yes [provider]    Social History   Socioeconomic History  . Marital status: Married    Spouse name: Not on file  . Number of children: Not on file  . Years of education: Not on file  . Highest education level: Not on file  Occupational History  . Not on  file  Tobacco Use  . Smoking status: Former Research scientist (life sciences)  . Smokeless tobacco: Never Used  Vaping Use  . Vaping Use: Never used  Substance and Sexual Activity  . Alcohol use: Yes    Alcohol/week: 2.0 standard drinks    Types: 2 Glasses of wine per week  . Drug use: Never  . Sexual activity: Not on file  Other Topics Concern  . Not on file  Social History Narrative  . Not on file   Social Determinants of Health   Financial Resource Strain: Not on file  Food Insecurity: Not on file  Transportation Needs: Not on file  Physical Activity: Not on file  Stress: Not on file  Social Connections: Not on file  Intimate Partner Violence: Not on file    Family Hx:  See HPI  ROS: [x]  Positive   [ ]  Negative   [ ]  All sytems reviewed and are negative  Cardiac: []  chest pain/pressure []  DOE  Vascular: [x]  hx of DVT []  swelling in  legs  Pulmonary: []  productive cough []  asthma/wheezing []  home O2  Neurologic: []  hx of CVA [x]  mini stroke-see HPI  Hematologic: [x]  hx of cancer [x]  bleeding problems  Endocrine:   []  diabetes []  thyroid disease  GI []  GERD  GU: []  CKD/renal failure []  HD--[]  M/W/F or []  T/T/S  Psychiatric: []  anxiety []  depression  Musculoskeletal: []  arthritis []  joint pain  Integumentary: []  rashes []  ulcers  Constitutional: []  fever    Physical Examination  Vitals:   04/20/20 0913 04/20/20 1220  BP: (!) 120/51 (!) 122/55  Pulse: 72 76  Resp: 18 18  Temp: 98.1 F (36.7 C) 98 F (36.7 C)  SpO2: 90% 93%   Body mass index is 21.44 kg/m.  General:  WDWN in NAD Gait: Not observed HENT: WNL, normocephalic Pulmonary: normal non-labored breathing Cardiac: regular, without  Murmurs, rubs or gallops; without carotid bruits Abdomen:  soft, NT/ND, no masses; aortic pulse is not palpable Skin: without rashes Vascular Exam/Pulses:  Right Left  Radial 2+ (normal) 2+ (normal)  Ulnar 2+ (normal) 2+ (normal)  Popliteal Unable to palpate Unable to palpate  DP 2+ (normal) 2+ (normal)  PT 2+ (normal) 2+ (normal)   Extremities: without Gangrene , without cellulitis; without open wounds;  Musculoskeletal: no muscle wasting or atrophy  Neurologic: A&O X 3;  No focal weakness or paresthesias are detected; speech is fluent/normal Psychiatric:  The pt has Normal affect.   CBC    Component Value Date/Time   WBC 9.9 04/20/2020 0216   RBC 5.31 04/20/2020 0216   HGB 15.7 04/20/2020 0216   HCT 48.5 04/20/2020 0216   PLT 195 04/20/2020 0216   MCV 91.3 04/20/2020 0216   MCH 29.6 04/20/2020 0216   MCHC 32.4 04/20/2020 0216   RDW 13.0 04/20/2020 0216   LYMPHSABS 1.4 04/19/2020 0230   MONOABS 0.7 04/19/2020 0230   EOSABS 0.2 04/19/2020 0230   BASOSABS 0.0 04/19/2020 0230    BMET    Component Value Date/Time   NA 141 04/20/2020 0216   K 4.6 04/20/2020 0216   CL 105  04/20/2020 0216   CO2 25 04/20/2020 0216   GLUCOSE 95 04/20/2020 0216   BUN 15 04/20/2020 0216   CREATININE 0.94 04/20/2020 0216   CALCIUM 9.0 04/20/2020 0216   GFRNONAA >60 04/20/2020 0216    COAGS: Lab Results  Component Value Date   INR 2.0 (H) 04/20/2020   INR 2.2 (H) 04/19/2020   INR 2.2 (H)  02/25/2008   PROTIME 39.6 (H) 03/19/2007     Non-Invasive Vascular Imaging:   Carotid duplex 04/20/2020: Summary:  Right Carotid: Velocities in the right ICA are consistent with a 1-39%  stenosis.   Left Carotid: Velocities in the left ICA are consistent with a 80-99%  stenosis based on peak systolic velocities and IC/CC ratio.   Vertebrals: Bilateral vertebral arteries demonstrate antegrade flow.  Subclavians: Normal flow hemodynamics were seen in bilateral subclavian arteries.  MRI brain 04/20/2020:  IMPRESSION: 1. Numerous scattered punctate acute infarctions in the left posterior frontal region consistent with micro embolic infarctions which could either be in the anterior or middle cerebral artery territory. Punctate acute infarction also affecting the splenium of the corpus callosum. 2. Extensive chronic small-vessel ischemic changes elsewhere throughout the brain as outlined above. 3. Small old left parietal vertex cortical and subcortical infarctions   ASSESSMENT/PLAN: This is a 79 y.o. male with symptomatic left carotid artery stenosis  -pt with symptomatic left carotid artery stenosis with BLE weakness with right > left as well as right hand weakness.   -carotid duplex reveals 80-99% left ICA stenosis.  Discussed findings with pt and family.  Dr. Trula Slade to see pt this afternoon and discuss options for intervention.   -pt is on coumadin for hx of PE and DVT in the past due to genetic clotting disorder. He is not on aspirin due to being on coumadin.   -continue statin.    Leontine Locket, PA-C [facility] [phone]  I have seen and  evaluated the patient and agree with the above assessment and plan.  Briefly this is an 79 year old male who presented with right-sided weakness.  This began approximately 48 hours ago.  He did not receive TPA.  His work-up revealed a greater than 80% left carotid stenosis on CT scan as well as carotid duplex.  He does have a history of PE and DVT in the past, for which she is on Coumadin given a work-up that was positive for a genetic clotting disorder.  The patient's wife and daughter were present for our discussions.  I discussed that proceeding with left carotid revascularization would lower his risk for stroke in the future.  I think he would be a good candidate for TCAR.  I discussed the details of the procedure as well as the risks and benefits including intraprocedural stroke.  I also discussed that he would need to be off of his Coumadin prior to surgery.  He would also need aspirin and Plavix for a month in addition to his Coumadin after the stent.  The family is going to further discuss this and make a decision tomorrow.  Surgery will be able to be performed, likely next week on Wednesday.  From my perspective he would not need to remain in the hospital until then.  If he elects to proceed with surgery, he will need to be started on aspirin and Plavix, and continue his statin.  I would also need to stop his Coumadin.  Given his genetic clotting disorder, he would need a Lovenox bridge.  I will follow up with the patient and family tomorrow for their final decision.  Annamarie Major

## 2020-04-20 NOTE — Progress Notes (Signed)
Echocardiogram 2D Echocardiogram has been performed.  Oneal Deputy Tymesha Ditmore 04/20/2020, 12:24 PM

## 2020-04-20 NOTE — Progress Notes (Signed)
New Admission Note:   Arrival Method: Arrived from [facility] ED via [facility] Mental Orientation: Alert and oriented x4 Telemetry: Box #8 Assessment: Completed Skin: Intact IV: NSL-Rt AC Pain: Denies Tubes: N/A Safety Measures: Safety Fall Prevention Plan has been discussed.  Admission: Completed 5MW Orientation: Patient has been oriented to the room, unit and staff.  Family: None at bedside. Paged admitting MD via flow manager for additional admission orders.  Orders have been reviewed and implemented. Will continue to monitor the patient. Call light has been placed within reach and bed alarm has been activated.   Lavinia Mcneely [employer], RN-BC Phone number: [phone]

## 2020-04-20 NOTE — Plan of Care (Signed)
  Problem: Education: Goal: Knowledge of General Education information will improve Description: Including pain rating scale, medication(s)/side effects and non-pharmacologic comfort measures Outcome: Progressing Note: Discussed testing results with patient.   Problem: Coping: Goal: Level of anxiety will decrease Outcome: Progressing   Problem: Safety: Goal: Ability to remain free from injury will improve Outcome: Progressing  Discussed signs and symptoms of impending CVA with patient and family.  Questions answered to patient's satisfaction as per patient.  PT in to assess patient.  [facility] swallow study performed and was negative.

## 2020-04-20 NOTE — Progress Notes (Signed)
Carotid duplex bilateral study completed.  Preliminary results relayed to Dahal, MD.   See CV Proc for preliminary results report.   Darlin Coco, RDMS

## 2020-04-20 NOTE — Progress Notes (Signed)
Inpatient Rehab Admissions Coordinator Note:   Per PT recommendations, pt was screened for CIR candidacy by Shann Medal, PT, DPT.  At this time we are recommending a CIR consult and I will place an order per our protocol.  If pt does not qualify for inpatient status, may not have the medical necessity for CIR admit.  Please contact me with questions.   Shann Medal, PT, DPT [phone] 04/20/20 2:36 PM

## 2020-04-20 NOTE — Progress Notes (Signed)
Patient's daughter approached this nurse stating that patient had had some hematuria.  On examination, a small amount of bleeding was noted on the head of his penis.  Will monitor closely for increased bleeding.  As of yet, only scant amt was seen.  Report called to Ubaldo Glassing, RN on 3W.  Pt to be transferred for closer monitoring of neurological status post acute CVA as per MRI.

## 2020-04-20 NOTE — H&P (Signed)
History and Physical    John Lambert ANV:[id number] DOB: December 13, 1940 DOA: 04/19/2020  PCP: Harlan Stains, MD   Patient coming from: Home via [facility]  Chief Complaint: weakness in legs, pain in left hip  HPI: John Lambert is a 79 y.o. 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 He also has pain in left upper leg/hip region. Symptoms started acutely 24 hours ago when he was getting out of bed. He initially had difficulty getting out of bed, but was able to ambulate with a Rollator to his living room and during day could get to kitchen and bathroom.  After supper he was going to go back to his bedroom to go to sleep when he tried to get up off of the sofa and his legs gave out on him. He reports legs are weak and he was not able to ambulate. He reports developing right foot dragging the past 24 hours.  Is complaining of pain in his left upper leg.  He also has noted some difficulty using his right hand but that has improved. He thinks the last known normal would have been Monday night as he awoke with difficulties on Tuesday morning.  He denies any head injury.  He denies fever or chills. 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 He also has a history of melanoma being removed from his right calf about 30 years ago.   ED Course: He had negative xrays and CT of his left hip and pelvis. He did not have imaging of brain. Labs were unremarkable. INR therapeutic at 2.2.   Review of Systems:  General: Reports acute onset of weakness in legs. Denies fever, chills, weight loss, night sweats.  Denies dizziness.  Denies change in appetite HENT: Denies head trauma, headache, denies change in hearing, tinnitus.  Denies nasal congestion or bleeding.  Denies sore throat, sores in mouth.  Denies difficulty  swallowing Eyes: Denies blurry vision, pain in eye, drainage.  Denies discoloration of eyes. Neck: Denies pain.  Denies swelling.  Denies pain with movement. Cardiovascular: Denies chest pain, palpitations.  Denies edema.  Denies orthopnea Respiratory: Denies shortness of breath, cough.  Denies wheezing.  Denies sputum production Gastrointestinal: Denies abdominal pain, swelling.  Denies nausea, vomiting, diarrhea.  Denies melena.  Denies hematemesis. Musculoskeletal: Reports pain in left upper leg and hip. Denies limitation of movement.  Denies deformity or swelling. Denies arthralgias or myalgias. Genitourinary: Denies pelvic pain.  Denies urinary frequency or hesitancy.  Denies dysuria.  Skin: Denies rash.  Denies petechiae, purpura, ecchymosis. Neurological: Reports acute onset of dragging right leg for past 24 hours. Denies headache.  Denies syncope. Denies seizure activity. Denies paresthesia. Denies slurred speech, drooping face. Denies visual change. Psychiatric: Denies depression, anxiety. Denies hallucinations.  Past Medical History:  Diagnosis Date   Blood clotting disorder ([place])    Blood dyscrasia    Cancer ([place])    melanoma removed from right calf   Hypertension    Neuromuscular disorder ([place])    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    Past Surgical History:  Procedure Laterality Date   SKIN BIOPSY      Social History  reports that he has quit smoking. He has never used smokeless tobacco. He reports current alcohol use of about 2.0 standard drinks of alcohol per week. He  reports that he does not use drugs.  Allergies  Allergen Reactions   Hctz [Hydrochlorothiazide] Other (See Comments)    dizzy   Hydrocodone    Vicodin [Hydrocodone-Acetaminophen]     History reviewed. No pertinent family history.   Prior to Admission medications   Medication Sig Start Date End Date Taking? Authorizing Provider  acetaminophen (TYLENOL) 500 MG tablet Take 500 mg by mouth  every 6 (six) hours as needed.   Yes [provider]  carvedilol (COREG) 25 MG tablet Take 25 mg by mouth 2 (two) times daily with a meal.   Yes [provider]  lisinopril (ZESTRIL) 20 MG tablet Take 20 mg by mouth daily.   Yes [provider]  pravastatin (PRAVACHOL) 20 MG tablet Take 20 mg by mouth daily.   Yes [provider]  warfarin (COUMADIN) 2.5 MG tablet Take 2.5 mg by mouth daily.   Yes [provider]    Physical Exam: Vitals:   04/19/20 2100 04/19/20 2300 04/20/20 0021 04/20/20 0037  BP: 140/86 (!) 157/81 (!) 150/91   Pulse: 78 70 76   Resp: 18  18   Temp:   98 F (36.7 C)   TempSrc:   Oral   SpO2: 99% 96% 92%   Weight:    75.8 kg  Height:    6\' 2"  (1.88 m)    Constitutional: NAD, calm, comfortable Vitals:   04/19/20 2100 04/19/20 2300 04/20/20 0021 04/20/20 0037  BP: 140/86 (!) 157/81 (!) 150/91   Pulse: 78 70 76   Resp: 18  18   Temp:   98 F (36.7 C)   TempSrc:   Oral   SpO2: 99% 96% 92%   Weight:    75.8 kg  Height:    6\' 2"  (1.88 m)   General: WDWN, Alert and oriented x3.  Eyes: EOMI, PERRL, conjunctivae normal.  Sclera nonicteric HENT:  [place]/AT, external ears normal.  Nares patent without epistasis.  Mucous membranes are moist. Posterior pharynx clear of any exudate or lesions. Neck: Soft, normal range of motion, supple, no masses, no thyromegaly.  Trachea midline Respiratory: clear to auscultation bilaterally, no wheezing, no crackles. Normal respiratory effort. No accessory muscle use.  Cardiovascular: Regular rate and rhythm, no murmurs / rubs / gallops. No extremity edema. 2+ pedal pulses  Abdomen: Soft, no tenderness, nondistended, no rebound or guarding.  No masses palpated. Bowel sounds normoactive Musculoskeletal:   Moves all extremities spontaneously.  No clubbing / cyanosis. No joint deformity upper and lower extremities. Normal muscle tone.  Skin: Warm, dry, intact no rashes, lesions, ulcers. No  induration Neurologic: CN 2-12 grossly intact.  Normal speech.  Sensation intact, patella DTR +1 bilaterally. Strength 4/5 in left leg and 3/5 in right leg. Strength 5/5 in upper extremities.   Psychiatric: Normal judgment and insight.  Normal mood.    Labs on Admission: I have personally reviewed following labs and imaging studies  CBC: Recent Labs  Lab 04/19/20 0230  WBC 10.8*  NEUTROABS 8.4*  HGB 16.5  HCT 49.0  MCV 91.2  PLT 250    Basic Metabolic Panel: Recent Labs  Lab 04/19/20 0230  NA 139  K 4.0  CL 106  CO2 24  GLUCOSE 110*  BUN 19  CREATININE 0.86  CALCIUM 9.0    GFR: Estimated Creatinine Clearance: 72.2 mL/min (by C-G formula based on SCr of 0.86 mg/dL).  Liver Function Tests: Recent Labs  Lab 04/19/20 0230  AST 18  ALT 16  ALKPHOS 57  BILITOT 0.9  PROT 6.8  ALBUMIN 4.1    Urine analysis:    Component Value Date/Time   COLORURINE YELLOW 04/19/2020 0455   APPEARANCEUR CLEAR 04/19/2020 0455   LABSPEC >1.030 (H) 04/19/2020 0455   PHURINE 5.0 04/19/2020 [place] 04/19/2020 0455   HGBUR NEGATIVE 04/19/2020 0455   BILIRUBINUR NEGATIVE 04/19/2020 0455   KETONESUR 15 (A) 04/19/2020 0455   PROTEINUR NEGATIVE 04/19/2020 0455   NITRITE NEGATIVE 04/19/2020 0455   LEUKOCYTESUR NEGATIVE 04/19/2020 0455    Radiological Exams on Admission: CT Hip Left Wo Contrast  Result Date: 04/19/2020 CLINICAL DATA:  Hip pain. EXAM: CT OF THE LEFT HIP WITHOUT CONTRAST TECHNIQUE: Multidetector CT imaging of the left hip was performed according to the standard protocol. Multiplanar CT image reconstructions were also generated. COMPARISON:  X-ray earlier same day FINDINGS: Bones/Joint/Cartilage No evidence for an acute fracture in the visualized left hemipelvis or proximal left femur. Specifically, no evidence for femoral neck fracture. No disruption of the tensile or compressive trabeculated. Patient is noted to have degenerative changes in the  acetabulum at least 2 small ossified intra-articular loose bodies in the joint space of the left hip. No substantial joint effusion. Ligaments Suboptimally assessed by CT. Muscles and Tendons No intramuscular or subcutaneous hemorrhage/hematoma. Soft tissues Unremarkable. IMPRESSION: 1. No evidence for acute fracture in the visualized left hemipelvis or proximal left femur. Specifically, no evidence for femoral neck fracture. 2. Degenerative changes in the left hip with at least 2 small ossified intra-articular loose bodies in the joint space of the left hip. Electronically Signed   By: Misty Stanley M.D.   On: 04/19/2020 05:16   DG Hip Unilat W or Wo Pelvis 2-3 Views Left  Result Date: 04/19/2020 CLINICAL DATA:  Fall, left hip pain EXAM: DG HIP (WITH OR WITHOUT PELVIS) 2-3V LEFT COMPARISON:  None. FINDINGS: Single view radiograph of the pelvis and two view radiograph of the left hip demonstrates normal alignment. No fracture or dislocation. There is at least mild left hip degenerative arthritis with asymmetric joint space narrowing. Vascular calcifications are seen within the left hemipelvis. IMPRESSION: No acute fracture or dislocation. Electronically Signed   By: Fidela Salisbury MD   On: 04/19/2020 02:57   DG FEMUR MIN 2 VIEWS LEFT  Result Date: 04/19/2020 CLINICAL DATA:  Fall, pain EXAM: LEFT FEMUR 2 VIEWS COMPARISON:  None. FINDINGS: No acute bony abnormality. Specifically, no fracture, subluxation, or dislocation. Joint spaces maintained. IMPRESSION: No acute bony abnormality. Electronically Signed   By: Rolm Baptise M.D.   On: 04/19/2020 02:57    Assessment/Plan Principal Problem:   Weakness John Lambert is observed on med surg floor.  Consult PT for evaluation With acute onset of weakness in legs-right more than left-and having right foot drag will obtain MRI brain to rule out CVA and MRI lumbar spine to rule out spinal pathology as etiology. Pt does have history of neurofibroma removed from his  spine in the distant past.  Active Problems:   Impaired ambulation  With therapy evaluation in the morning and await further recommendations    Essential hypertension Continue home dose of Coreg and lisinopril.  Monitor blood pressure    Anticoagulated on warfarin INR is therapeutic at 2.2.  Continue warfarin patient has a genetic clotting disorder.  Monitor INR    Fall at home, initial encounter Physical therapy evaluation as above    Blood clotting disorder (HCC) Coumadin as above  DVT prophylaxis: John Lambert is  anticoagulated with Coumadin with a therapeutic INR.  Continue Coumadin and monitor INR Code Status:   Full code Family Communication:  Diagnosed when discussed with patient.  He verbalized understanding agrees with plan.  Further recommendations to follow as clinical indicated Disposition Plan:   Patient is from:  Home  Anticipated DC to:  Home  Anticipated DC date:  Anticipate discharge in the next 24 to 36 hours  Admission status:  Observation  Eben Burow MD [facility]  How to contact the [facility] Attending or Consulting provider [place] or covering provider during after hours [place], for this patient?   1. Check the care team in [facility] and look for a) attending/consulting [facility] provider listed and b) the [facility] team listed 2. Log into [email or web address] and use [place] universal password to access. If you do not have the password, please contact the hospital operator. 3. Locate the [facility] provider you are looking for under [facility] and page to a number that you can be directly reached. 4. If you still have difficulty reaching the provider, please page the [facility] (Director on Call) for the Hospitalists listed on amion for assistance.  04/20/2020, 1:45 AM

## 2020-04-20 NOTE — Consult Note (Signed)
Physical Medicine and Rehabilitation Consult Reason for Consult: Bilateral lower extremity weakness with gait abnormality Referring Physician: [facility]   HPI: John Lambert is a 79 y.o. right-handed male with history of hypertension, hyperlipidemia, blood clotting disorder with history of PE and DVT on chronic anticoagulation.  Per chart review patient lives with spouse independent with assistive device.  1 level home one-step to entry.  Presented 04/19/2020 with lower extremity weakness and gait instability with pain in the left hip.  MRI of the brain showed numerous scattered punctate acute infarcts in left posterior frontal region consistent with microembolic infarcts which could either be in the anterior middle cerebral artery territory.  Punctate acute infarct also affecting the splenium of the corpus callosum.  Small old left parietal vertex cortical and subcortical infarct.  MRI lumbar spine superimposed degenerative primarily at L4-L5 and L5-S1 with multifactorial spinal stenosis.  Admission chemistries unremarkable aside glucose 110, WBC 10,800, urinalysis negative nitrite.  Echo with ejection fraction of 60 to 65% no wall motion abnormalities grade 1 diastolic dysfunction..  Carotid Dopplers with left ICA stenosis of 80 to 99%.  Patient currently remains on chronic Coumadin therapy.  Tolerating a regular diet.  Therapy evaluations completed with recommendations of physical medicine rehab consult.   Pt reports had pain with voiding yesterday--was burning- but not today- also had some trace blood when voided- but wasn't mixed into urine.   Pain in L thigh- since his fall which signalled his stroke.  Has been walking with RW with therapy- used rollator to walk at home.  Denies constipation Sx's.  LBM this AM- denies constipation.     Review of Systems  Constitutional: Negative for chills and fever.  HENT: Negative for hearing loss.   Eyes: Negative for blurred vision and double  vision.  Respiratory: Negative for cough and shortness of breath.   Cardiovascular: Negative for chest pain, palpitations and leg swelling.  Gastrointestinal: Positive for constipation. Negative for heartburn, nausea and vomiting.  Genitourinary: Negative for dysuria, flank pain and hematuria.  Musculoskeletal: Positive for back pain, joint pain and myalgias.  Skin: Negative for rash.  Neurological: Positive for weakness.  All other systems reviewed and are negative.  Past Medical History:  Diagnosis Date  . Blood clotting disorder ([place])   . Blood dyscrasia   . Cancer ([place])    melanoma removed from right calf  . Hypertension   . Neuromuscular disorder ([place])    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   Past Surgical History:  Procedure Laterality Date  . SKIN BIOPSY     History reviewed. No pertinent family history. Social History:  reports that he has quit smoking. He has never used smokeless tobacco. He reports current alcohol use of about 2.0 standard drinks of alcohol per week. He reports that he does not use drugs. Allergies:  Allergies  Allergen Reactions  . Hctz [Hydrochlorothiazide] Other (See Comments)    dizzy  . Hydrocodone Itching  . Vicodin [Hydrocodone-Acetaminophen] Itching   Medications Prior to Admission  Medication Sig Dispense Refill  . acetaminophen (TYLENOL) 500 MG tablet Take 500 mg by mouth every 6 (six) hours as needed for mild pain.    . carvedilol (COREG) 25 MG tablet Take 25 mg by mouth 2 (two) times daily with a meal.    . lisinopril (ZESTRIL) 20 MG tablet Take 20 mg by mouth daily.    . pravastatin (PRAVACHOL) 20 MG tablet Take 20 mg by mouth daily.    Marland Kitchen warfarin (  COUMADIN) 2.5 MG tablet Take 1.25-2.5 mg by mouth See admin instructions. Take 1 tablet (2.5 MG) daily except on Mondays take 1/2 tablet (1.25 MG)      Home: Home Living Family/patient expects to be discharged to:: Private residence Living Arrangements: Spouse/significant other Available  Help at Discharge: Family,Available 24 hours/day (at or near 24 hours) Type of Home: House Home Access: Stairs to enter [employer] of Steps: 1 (through garage) Home Layout: One level Bathroom Shower/Tub: Tub/shower unit Home Equipment: Environmental consultant - 4 wheels  Functional History: Prior Function Level of Independence: Independent with assistive device(s) Comments: Started using Rollator RW a few months ago after a fall Functional Status:  Mobility: Bed Mobility Overal bed mobility: Needs Assistance Bed Mobility: Rolling,Sidelying to Sit Rolling: Min assist Sidelying to sit: Mod assist General bed mobility comments: Cues for technqiue, and Mod assist to elevate trunk to sit; Close guard with reciprocal scooting to EOB, inefficient weight shifting for scooting -- energetically taxing Transfers Overall transfer level: Needs assistance Equipment used: Rolling walker (2 wheeled) Transfers: Sit to/from Stand Sit to Stand: Mod assist,+2 safety/equipment General transfer comment: Mod assist to power up and multimodal cues to initiate with forward lean; tendency for posterior bias; stands with bil knees in slightly bent position Ambulation/Gait Ambulation/Gait assistance: Mod assist,+2 safety/equipment Gait Distance (Feet): 15 Feet Assistive device: Rolling walker (2 wheeled),2 person hand held assist Gait Pattern/deviations: Decreased step length - right,Step-to pattern,Decreased dorsiflexion - right General Gait Details: Tends to walk with upper trunk quite flexed; Decr R step length with decr foot clearance; Occasional posterior bias leading to loss of balance, mod assist to recover Gait velocity: quite slow    ADL:    Cognition: Cognition Overall Cognitive Status: Within Functional Limits for tasks assessed Orientation Level: Oriented X4 Cognition Arousal/Alertness: Awake/alert Behavior During Therapy: WFL for tasks assessed/performed Overall Cognitive Status: Within  Functional Limits for tasks assessed  Blood pressure (!) 120/51, pulse 72, temperature 98.1 F (36.7 C), temperature source Oral, resp. rate 18, height 6\' 2"  (1.88 m), weight 75.8 kg, SpO2 90 %. Physical Exam Vitals and nursing note reviewed. Exam conducted with a chaperone present.  Constitutional:      Comments: Pt sitting in bedside chair, wife and daughter at bedside, appropriate, not leaning at all, NAD- frail/thin appearing  HENT:     Head: Normocephalic and atraumatic.     Comments: Smile equal, tongue midline- facial sensation intact    Right Ear: External ear normal.     Left Ear: External ear normal.     Nose: Nose normal. No congestion.     Mouth/Throat:     Mouth: Mucous membranes are dry.     Pharynx: Oropharynx is clear. No oropharyngeal exudate.  Eyes:     General:        Left eye: No discharge.     Extraocular Movements: Extraocular movements intact.     Comments: No nystagmus B/L  Cardiovascular:     Rate and Rhythm: Normal rate and regular rhythm.     Heart sounds: Normal heart sounds. No murmur heard.   Pulmonary:     Comments: CTA B/L- no W/R/R- good air movement Abdominal:     Comments: Soft, NT, ND, (+)BS -normoactive  Musculoskeletal:     Cervical back: Normal range of motion. No rigidity.     Comments: UEs- B/L- biceps, triceps, WE all 5/5- grip and finger abd 5-/5 B/L LEs- RLE- HF 4+/5, KIE 4+/5, DF and PF 4+/5 LLE- HF 3/5- due  to pain; KE 5-/5, DF and PF 5-/5  TTP over L thigh, esp when moving L thigh/positioning  Skin:    General: Skin is warm and dry.     Comments: No skin breakdown seen on extremities  Neurological:     Comments: Patient is alert in no acute distress oriented x3 and follows commands. Ox3- appropriate- light touch intact x4 extremities   Psychiatric:        Mood and Affect: Mood normal.        Behavior: Behavior normal.     Results for orders placed or performed during the hospital encounter of 04/19/20 (from the past 24  hour(s))  Protime-INR     Status: Abnormal   Collection Time: 04/20/20  2:16 AM  Result Value Ref Range   Prothrombin Time 21.8 (H) 11.4 - 15.2 seconds   INR 2.0 (H) 0.8 - 1.2  CBC     Status: None   Collection Time: 04/20/20  2:16 AM  Result Value Ref Range   WBC 9.9 4.0 - 10.5 K/uL   RBC 5.31 4.22 - 5.81 MIL/uL   Hemoglobin 15.7 13.0 - 17.0 g/dL   HCT 48.5 39.0 - 52.0 %   MCV 91.3 80.0 - 100.0 fL   MCH 29.6 26.0 - 34.0 pg   MCHC 32.4 30.0 - 36.0 g/dL   RDW 13.0 11.5 - 15.5 %   Platelets 195 150 - 400 K/uL   nRBC 0.0 0.0 - 0.2 %  Basic metabolic panel     Status: None   Collection Time: 04/20/20  2:16 AM  Result Value Ref Range   Sodium 141 135 - 145 mmol/L   Potassium 4.6 3.5 - 5.1 mmol/L   Chloride 105 98 - 111 mmol/L   CO2 25 22 - 32 mmol/L   Glucose, Bld 95 70 - 99 mg/dL   BUN 15 8 - 23 mg/dL   Creatinine, Ser 0.94 0.61 - 1.24 mg/dL   Calcium 9.0 8.9 - 10.3 mg/dL   GFR, Estimated >60 >60 mL/min   Anion gap 11 5 - 15  Hemoglobin A1c     Status: None   Collection Time: 04/20/20  2:16 AM  Result Value Ref Range   Hgb A1c MFr Bld 5.3 4.8 - 5.6 %   Mean Plasma Glucose 105.41 mg/dL   MR BRAIN WO CONTRAST  Result Date: 04/20/2020 CLINICAL DATA:  Acute presentation leg weakness difficulty walking EXAM: MRI HEAD WITHOUT CONTRAST TECHNIQUE: Multiplanar, multiecho pulse sequences of the brain and surrounding structures were obtained without intravenous contrast. COMPARISON:  None. FINDINGS: Brain: There are numerous scattered punctate acute infarctions affecting the left posterior frontal region, none larger than 1 cm, consistent with a cluster of micro embolic infarctions which could either be in the anterior or middle cerebral artery territory. Punctate acute infarction also affecting the splenium of the corpus callosum. No acute infarction in the right hemisphere, brainstem or cerebellum. Old small vessel infarctions affect the cerebellum and the pons. Chronic small-vessel  ischemic changes present throughout the cerebral hemispheric white matter. Old cortical and subcortical infarctions at the left parietal vertex. 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 No hydrocephalus, mass or extra-axial collection. No swelling or acute hemorrhage. Vascular: Major vessels at the base of the brain show flow. Skull and upper cervical spine: Negative Sinuses/Orbits: Clear/normal Other: None IMPRESSION: 1. Numerous scattered punctate acute infarctions in the left posterior frontal region consistent with micro embolic infarctions which could either be in the anterior or middle cerebral artery  territory. Punctate acute infarction also affecting the splenium of the corpus callosum. 2. Extensive chronic small-vessel ischemic changes elsewhere throughout the brain as outlined above. 3. Small old left parietal vertex cortical and subcortical infarctions. Electronically Signed   By: Nelson Chimes M.D.   On: 04/20/2020 08:16   MR LUMBAR SPINE WO CONTRAST  Result Date: 04/20/2020 CLINICAL DATA:  79 year old male with history of 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. Acute foot drag. Clotting disorder. Weakness in legs, difficulty walking. EXAM: MRI LUMBAR SPINE WITHOUT CONTRAST TECHNIQUE: Multiplanar, multisequence MR imaging of the lumbar spine was performed. No intravenous contrast was administered. COMPARISON:  Hip CT yesterday. FINDINGS: Segmentation: Lumbar segmentation appears to be normal and will be designated as such for this report. Alignment: Preserved, somewhat exaggerated lumbar lordosis. No spondylolisthesis. Vertebrae: No marrow edema or evidence of acute osseous abnormality. Background bone marrow signal mildly heterogeneous but within normal limits. Visible sacrum and SI joints appear intact. Conus medullaris and cauda equina: Conus extends to the L1-L2 level. No lower spinal cord or conus signal abnormality. Beginning just below the L2-L3 disc level there is architectural distortion within the  thecal sac with adhesions and clumping of nerve roots (series 6, image 18) which continues to the sacrum compatible with chronic arachnoiditis. No evidence of intrathecal mass. Paraspinal and other soft tissues: Tortuous abdominal aorta. Nonspecific mild bilateral perinephric fluid. Lumbar paraspinal soft tissues are within normal limits. Disc levels: Generally mild for age lumbar spine degeneration. No spinal stenosis above L4-L5. L4-L5: Circumferential disc bulge and endplate spurring with moderate posterior element hypertrophy. Arachnoiditis with distorted cauda equina. Mild to moderate spinal stenosis. Mild right L4 foraminal stenosis. L5-S1: Disc space loss with bulky right anterolateral disc osteophyte complex. Mild facet hypertrophy. Arachnoiditis. No spinal stenosis. There is mild to moderate right lateral recess stenosis (right S1 nerve level series 3, image 7). Mild to moderate right L5 foraminal stenosis. IMPRESSION: 1. 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 No recurrent spinal tumor is evident. 2. Superimposed lumbar spine degeneration primarily at L4-L5 and L5-S1. L4-L5 multifactorial mild to moderate multifactorial spinal stenosis, mild right L4 neural foraminal stenosis. L5-S1 moderate right lateral recess and foraminal stenosis. Query Right L5 and/or S1 radiculitis. Electronically Signed   By: Genevie Ann M.D.   On: 04/20/2020 08:23   CT Hip Left Wo Contrast  Result Date: 04/19/2020 CLINICAL DATA:  Hip pain. EXAM: CT OF THE LEFT HIP WITHOUT CONTRAST TECHNIQUE: Multidetector CT imaging of the left hip was performed according to the standard protocol. Multiplanar CT image reconstructions were also generated. COMPARISON:  X-ray earlier same day FINDINGS: Bones/Joint/Cartilage No evidence for an acute fracture in the visualized left hemipelvis or proximal left femur. Specifically, no evidence for femoral neck fracture. No  disruption of the tensile or compressive trabeculated. Patient is noted to have degenerative changes in the acetabulum at least 2 small ossified intra-articular loose bodies in the joint space of the left hip. No substantial joint effusion. Ligaments Suboptimally assessed by CT. Muscles and Tendons No intramuscular or subcutaneous hemorrhage/hematoma. Soft tissues Unremarkable. IMPRESSION: 1. No evidence for acute fracture in the visualized left hemipelvis or proximal left femur. Specifically, no evidence for femoral neck fracture. 2. Degenerative changes in the left hip with at least 2 small ossified intra-articular loose bodies in the joint space of the left hip. Electronically Signed   By: Misty Stanley M.D.   On: 04/19/2020 05:16   ECHOCARDIOGRAM COMPLETE  BUBBLE STUDY  Result Date: 04/20/2020    ECHOCARDIOGRAM REPORT   Patient Name:   John Lambert [facility] Date of Exam: 04/20/2020 Medical Rec #:  [id number]      Height:       74.0 in Accession #:    [id number]     Weight:       167.0 lb Date of Birth:  Sep 15, 1940      BSA:          2.013 m Patient Age:    9 years       BP:           120/51 mmHg Patient Gender: M              HR:           70 bpm. Exam Location:  Inpatient Procedure: 2D Echo, Color Doppler, Cardiac Doppler and Saline Contrast Bubble            Study Indications:    Stroke i163.9  History:        Patient has no prior history of Echocardiogram examinations.                 Risk Factors:Hypertension and Dyslipidemia.  Sonographer:    Raquel Sarna Senior RDCS Referring Phys: [id number] [place]  1. Left ventricular ejection fraction, by estimation, is 60 to 65%. The left ventricle has normal function. The left ventricle has no regional wall motion abnormalities. There is moderate left ventricular hypertrophy of the basal-septal segment. Left ventricular diastolic parameters are consistent with Grade I diastolic dysfunction (impaired relaxation).  2. Right ventricular systolic function is normal.  The right ventricular size is normal. Tricuspid regurgitation signal is inadequate for assessing PA pressure.  3. The mitral valve is normal in structure. Trivial mitral valve regurgitation. No evidence of mitral stenosis.  4. The aortic valve is tricuspid. Aortic valve regurgitation is trivial. Mild to moderate aortic valve sclerosis/calcification is present, without any evidence of aortic stenosis.  5. The inferior vena cava is normal in size with <50% respiratory variability, suggesting right atrial pressure of 8 mmHg. FINDINGS  Left Ventricle: Left ventricular ejection fraction, by estimation, is 60 to 65%. The left ventricle has normal function. The left ventricle has no regional wall motion abnormalities. The left ventricular internal cavity size was normal in size. There is  moderate left ventricular hypertrophy of the basal-septal segment. Left ventricular diastolic parameters are consistent with Grade I diastolic dysfunction (impaired relaxation). Normal left ventricular filling pressure. Right Ventricle: The right ventricular size is normal. No increase in right ventricular wall thickness. Right ventricular systolic function is normal. Tricuspid regurgitation signal is inadequate for assessing PA pressure. Left Atrium: Left atrial size was normal in size. Right Atrium: Right atrial size was normal in size. Pericardium: There is no evidence of pericardial effusion. Mitral Valve: The mitral valve is normal in structure. There is mild calcification of the anterior mitral valve leaflet(s). Mild mitral annular calcification. Trivial mitral valve regurgitation. No evidence of mitral valve stenosis. Tricuspid Valve: The tricuspid valve is normal in structure. Tricuspid valve regurgitation is trivial. No evidence of tricuspid stenosis. Aortic Valve: The aortic valve is tricuspid. Aortic valve regurgitation is trivial. Mild to moderate aortic valve sclerosis/calcification is present, without any evidence of aortic  stenosis. Pulmonic Valve: The pulmonic valve was normal in structure. Pulmonic valve regurgitation is not visualized. No evidence of pulmonic stenosis. Aorta: The aortic root is normal in size and structure. Venous: The inferior vena cava  is normal in size with less than 50% respiratory variability, suggesting right atrial pressure of 8 mmHg. IAS/Shunts: No atrial level shunt detected by color flow Doppler. Agitated saline contrast was given intravenously to evaluate for intracardiac shunting.  LEFT VENTRICLE PLAX 2D LVIDd:         2.70 cm  Diastology LVIDs:         1.90 cm  LV e' medial:    6.42 cm/s LV PW:         1.30 cm  LV E/e' medial:  9.1 LV IVS:        1.40 cm  LV e' lateral:   6.09 cm/s LVOT diam:     2.40 cm  LV E/e' lateral: 9.6 LV SV:         89 LV SV Index:   44 LVOT Area:     4.52 cm  RIGHT VENTRICLE TAPSE (M-mode): 1.6 cm LEFT ATRIUM             Index       RIGHT ATRIUM           Index LA diam:        3.60 cm 1.79 cm/m  RA Area:     19.20 cm LA Vol (A2C):   50.5 ml 25.09 ml/m RA Volume:   48.50 ml  24.09 ml/m LA Vol (A4C):   52.7 ml 26.18 ml/m LA Biplane Vol: 52.4 ml 26.03 ml/m  AORTIC VALVE LVOT Vmax:   83.60 cm/s LVOT Vmean:  58.400 cm/s LVOT VTI:    0.197 m  AORTA Ao Root diam: 3.50 cm Ao Asc diam:  3.50 cm MITRAL VALVE MV Area (PHT): 2.39 cm    SHUNTS MV Decel Time: 317 msec    Systemic VTI:  0.20 m MV E velocity: 58.30 cm/s  Systemic Diam: 2.40 cm MV A velocity: 71.60 cm/s MV E/A ratio:  0.81 Fransico Him MD Electronically signed by Fransico Him MD Signature Date/Time: 04/20/2020/1:36:43 PM    Final    DG Hip Unilat W or Wo Pelvis 2-3 Views Left  Result Date: 04/19/2020 CLINICAL DATA:  Fall, left hip pain EXAM: DG HIP (WITH OR WITHOUT PELVIS) 2-3V LEFT COMPARISON:  None. FINDINGS: Single view radiograph of the pelvis and two view radiograph of the left hip demonstrates normal alignment. No fracture or dislocation. There is at least mild left hip degenerative arthritis with asymmetric  joint space narrowing. Vascular calcifications are seen within the left hemipelvis. IMPRESSION: No acute fracture or dislocation. Electronically Signed   By: Fidela Salisbury MD   On: 04/19/2020 02:57   DG FEMUR MIN 2 VIEWS LEFT  Result Date: 04/19/2020 CLINICAL DATA:  Fall, pain EXAM: LEFT FEMUR 2 VIEWS COMPARISON:  None. FINDINGS: No acute bony abnormality. Specifically, no fracture, subluxation, or dislocation. Joint spaces maintained. IMPRESSION: No acute bony abnormality. Electronically Signed   By: Rolm Baptise M.D.   On: 04/19/2020 02:57   VAS US CAROTID  Result Date: 04/20/2020 Carotid Arterial Duplex Study Indications:       CVA. Risk Factors:      Hypertension, Diabetes, past history of smoking, coronary                    artery disease. Comparison Study:  No prior studies. Performing Technologist: Darlin Coco RDMS  Examination Guidelines: A complete evaluation includes B-mode imaging, spectral Doppler, color Doppler, and power Doppler as needed of all accessible portions of each vessel. Bilateral testing is considered an integral  part of a complete examination. Limited examinations for reoccurring indications may be performed as noted.  Right Carotid Findings: +----------+--------+--------+--------+------------------+--------+           PSV cm/sEDV cm/sStenosisPlaque DescriptionComments +----------+--------+--------+--------+------------------+--------+ CCA Prox  103     15                                         +----------+--------+--------+--------+------------------+--------+ CCA Distal86      14                                         +----------+--------+--------+--------+------------------+--------+ ICA Prox  54      14      1-39%   heterogenous               +----------+--------+--------+--------+------------------+--------+ ICA Distal73      15                                         +----------+--------+--------+--------+------------------+--------+  ECA       90                                                 +----------+--------+--------+--------+------------------+--------+ +----------+--------+-------+----------------+-------------------+           PSV cm/sEDV cmsDescribe        Arm Pressure (mmHG) +----------+--------+-------+----------------+-------------------+ Subclavian117            Multiphasic, WNL                    +----------+--------+-------+----------------+-------------------+ +---------+--------+--+--------+-+---------+ VertebralPSV cm/s45EDV cm/s9Antegrade +---------+--------+--+--------+-+---------+  Left Carotid Findings: +----------+--------+--------+--------+--------------------+-------------------+           PSV cm/sEDV cm/sStenosisPlaque Description  Comments            +----------+--------+--------+--------+--------------------+-------------------+ CCA Prox  106     14                                                      +----------+--------+--------+--------+--------------------+-------------------+ CCA Distal88      18                                                      +----------+--------+--------+--------+--------------------+-------------------+ ICA Prox  364     97      80-99%  heterogenous and    80-99% based on                                       hypoechoic          peak systolic  velocities and                                                            IC/CC ratio         +----------+--------+--------+--------+--------------------+-------------------+ ICA Mid   404     70      80-99%  heterogenous and    80-99% based on                                       hypoechoic          peak systolic                                                             velocities and                                                            IC/CC ratio          +----------+--------+--------+--------+--------------------+-------------------+ ICA Distal170     33                                  tortuous            +----------+--------+--------+--------+--------------------+-------------------+ ECA       103     10                                                      +----------+--------+--------+--------+--------------------+-------------------+ +----------+--------+--------+----------------+-------------------+           PSV cm/sEDV cm/sDescribe        Arm Pressure (mmHG) +----------+--------+--------+----------------+-------------------+ Subclavian140             Multiphasic, WNL                    +----------+--------+--------+----------------+-------------------+ +---------+--------+--+--------+--+---------+ VertebralPSV cm/s47EDV cm/s13Antegrade +---------+--------+--+--------+--+---------+   Summary: Right Carotid: Velocities in the right ICA are consistent with a 1-39% stenosis. Left Carotid: Velocities in the left ICA are consistent with a 80-99% stenosis               based on peak systolic velocities and IC/CC ratio. Vertebrals:  Bilateral vertebral arteries demonstrate antegrade flow. Subclavians: Normal flow hemodynamics were seen in bilateral subclavian              arteries. *See table(s) above for measurements and observations.     Preliminary      Assessment/Plan: Diagnosis: CVA with mild R sided weakness- numerous punctate infarcts- L posterior frontal region- on Coumadin for clotting d/o x20+ years; HTN, HLD 1. Does the need for close, 24 hr/day medical supervision in concert  with the patient's rehab needs make it unreasonable for this patient to be served in a less intensive setting? Potentially 2. Co-Morbidities requiring supervision/potential complications: HTN, HLD, on Coumadin for clotting d/o- questionable hemturia 3. Due to safety, skin/wound care, disease management, medication administration, pain  management and patient education, does the patient require 24 hr/day rehab nursing? Potentially 4. Does the patient require coordinated care of a physician, rehab nurse, therapy disciplines of PT and OT to address physical and functional deficits in the context of the above medical diagnosis(es)? Potentially Addressing deficits in the following areas: balance, endurance, locomotion, strength, transferring, bathing, dressing, feeding, grooming and toileting 5. Can the patient actively participate in an intensive therapy program of at least 3 hrs of therapy per day at least 5 days per week? Potentially 6. The potential for patient to make measurable gains while on inpatient rehab is excellent 7. Anticipated functional outcomes upon discharge from inpatient rehab are modified independent and supervision  with PT, modified independent and supervision with OT, n/a with SLP. 8. Estimated rehab length of stay to reach the above functional goals is: 5-7 days- max 9. Anticipated discharge destination: Home 10. Overall Rehab/Functional Prognosis: excellent  RECOMMENDATIONS: This patient's condition is appropriate for continued rehabilitative care in the following setting: CIR and HH Therapy Patient has agreed to participate in recommended program. Potentially Note that insurance prior authorization may be required for reimbursement for recommended care.  Comment:  1. Pt is high level- could benefit from inpt rehab, or H/H therapy, however is NOT ready for d/c home yet, functionally.  2. If comes to CIR, will will arrange Adaptive equipment- if not, needs to be done by Acute.  3. Suggest family training with PT/OT begin now, just in case can't get into CIR-  4. We are tight on beds, but pt is an appropriate candidate- will try to get him in-  5. Will d/w admissions coordinators 6. Also has Carotid stenosis surgery scheduled next Wednesday 7. Thank you for this consult.   I spent a total of 85 minutes on  total consult- >50% on coordination of care and education.  20 minutes reviewing care; 20 minutes typing consult, but 45 minutes seeing pt and speaking with family and admissions coordinators.   Lavon Paganini Angiulli, PA-C 04/20/2020    I have personally performed a face to face diagnostic evaluation of this patient and formulated the key components of the plan.  Additionally, I have personally reviewed laboratory data, imaging studies, as well as relevant notes and concur with the physician assistant's documentation above.

## 2020-04-20 NOTE — Consult Note (Signed)
Neurology Consultation  CC: weakness in legs, pain in left hip  History is obtained from: patient, patient's wife and patient's daughter at bedside  HPI: John Lambert is a 79 y.o. 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 Patient states he woke up on Monday, 12/13 at 06:00 to adjust the heat in his home and felt normal with steady, baseline gait. He went back to sleep and at 09:00 he had trouble rolling over, getting out of bed, and ambulating. His wife assisted him out of bed and he used his Rollator to transfer to his living room. Per his wife, he felt better and ambulated better throughout the day until bedtime when he attempted to ambulate and slid to the floor. 12/13 evening and 12/14 he noted a dragging right foot and subjective weakness of his right hand with gripping/pushing. A MRI brain was obtained 12/15 following motor control changes that showed numerous scattered punctate acute infarctions in the left posterior frontal region consistent with micro embolic infarctions as well as extensive chronic small-vessel ischemic changes. He also complains of left hip pain with x-ray evidence of mild arthritis. Carotid doppler 12/15 showed evidence of left ICA stenosis of 80-99%.  He reported he had a similar fall about 6 months ago at which time he had started using a Rollator  LKW: 12/13 06:00 tpa given?: No, outside of tPA window IR Thrombectomy? No, no LVO on MRI brain Modified Rankin Scale: 1-No significant post stroke disability and can perform usual duties with stroke symptoms NIHSS:  1a Level of Conscious.:  1b LOC Questions: 0 1c LOC Commands: 0 2 Best Gaze: 0 3 Visual: 0 4  Facial Palsy: 0 5a Motor Arm - left: 0 5b Motor Arm - Right: 0 6a Motor Leg - Left: 1 pain limited left hip xray with mild arthritis  6b Motor Leg - Right: 0 7 Limb Ataxia: 0 8 Sensory: 0 9 Best Language: 0 10 Dysarthria: 0 11 Extinct. and Inatten.: 0 TOTAL: 1  ROS: A complete ROS was performed and is negative except as noted in the HPI.  Past Medical History:  Diagnosis Date  . Blood clotting disorder ([place])   . Blood dyscrasia   . Cancer ([place])    melanoma removed from right calf  . Hypertension   . Neuromuscular disorder ([place])    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   History reviewed. No pertinent family history.  Social History:  reports that he has quit smoking. He has never used smokeless tobacco. He reports current alcohol use of about 2.0 standard drinks of alcohol per week. He reports that he does not use drugs.   Current Scheduled Medications: . carvedilol  25 mg Oral BID WC  . pravastatin  20 mg Oral Daily  . warfarin  2.5 mg Oral q1600  . Warfarin - Physician Dosing Inpatient   Does not apply q1600   Current PRN Medications: acetaminophen  Exam: Current vital signs: BP (!) 120/51 (BP Location: Left Arm)   Pulse 72   Temp 98.1 F (36.7 C) (Oral)   Resp 18   Ht 6\' 2"  (1.88 m)   Wt 75.8 kg   SpO2 90%   BMI 21.44 kg/m   Physical Exam  Constitutional: Appears well-developed. Alert and cooperative with examination. Psych: Affect appropriate to situation Eyes: No scleral injection. Wears eyeglasses.  HENT: No OP obstruction Head: Normocephalic, atraumatic. Cardiovascular: Normal rate. Extremities warm and without edema. Respiratory: Effort normal, non-labored breathing.  GI: Soft.  No distension. There is no tenderness.  Skin: WDI  Neuro: Mental Status: Patient is awake, alert, oriented to person, place, month, year, and situation. Patient is able to give a clear and coherent history. No signs of aphasia or neglect Cranial Nerves: II: Visual Fields  are full. Pupils are equal, round, and reactive to light.  3 --> 2 brisk III,IV, VI: EOMI without ptosis or diploplia.  V: Facial sensation is symmetric to light touch. VII: Facial movement is symmetric.  VIII: Hearing is intact to voice X: Uvula elevates symmetrically XI: Shoulder shrug is symmetric. XII: Tongue is midline without atrophy or fasciculations.  Motor: Tone is normal. Bulk is normal. 5/5 strength present in upper extremities bilaterally. 5/5 strength to RLE, LLE 4/5 strength due to pain limitation of the left hip with x-ray evidence of mild arthritis.  There is pronation of the right upper extremity without drift.  Finger tapping is clumsier on the right than the left. Sensory: Sensation is symmetric to light touch in the arms and legs bilaterally. Deep Tendon Reflexes: 2+ and symmetric in the biceps and 1+ in patellae. Plantars: Toes are downgoing bilaterally. Cerebellar: FNF and HKS are intact bilaterally  I have reviewed labs in epic and the pertinent results are:   I have reviewed the images obtained: Vascular US Carotid 12/15 Impression: -Right Carotid: Velocities in the right ICA are consistent with a 1-39%  stenosis.  -Left Carotid: Velocities in the left ICA are consistent with a 80-99%  stenosis based on peak systolic velocities and IC/CC ratio.  - Vertebrals: Bilateral vertebral arteries demonstrate antegrade flow.  - Subclavians: Normal flow hemodynamics were seen in bilateral subclavian arteries.   MRI brain without contrast 12/15 Impression: 1. Numerous scattered punctate acute infarctions in the left posterior frontal region consistent with micro embolic infarctions which could either be in the anterior or middle cerebral artery territory. Punctate acute infarction also affecting the splenium of the corpus callosum. 2. Extensive chronic small-vessel ischemic changes elsewhere throughout the brain as outlined above. 3. Small old left parietal vertex  cortical and subcortical infarctions.   Impression: Mr. 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 LKW 12/13 06:00 (greater than 24 hours prior to hospital arrival). MRI brain was obtained revealing acute infarctions in the posterior frontal lobe at the motor cortex. Subsequently, a carotid ultrasound was ordered revealing 80-99% stenosis of the left ICA.  Suspect this is an atheroembolic event with symptomatic stenosis.  Recommendations: - Vascular consult for left ICA stenosis 80-99% - CT angio head and neck ordered to evaluate intracranial vasculature in the setting of chronic small vessel disease and carotid doppler with evidence of critical left ICA stenosis. - HgbA1c 5.3%, fasting lipid panel pending - Frequent neuro checks - Prophylactic therapy- continue home Coumadin (INR 2.2 on current admission), would not add aspirin to anticoagulation for neurological indication at this time - Risk factor modification - Telemetry monitoring - PT consult, OT  consult, Speech consult - Stroke team to follow  Assessment and plan discussed with attending provider and they are in agreement. Anibal Henderson, AGAC-NP [facility] [phone]  Attending Neurologist's note:  I personally saw this patient, gathering history, performing a full neurologic examination, reviewing relevant labs, personally reviewing relevant imaging including MRI brain, and formulated the assessment and plan, adding the note above for completeness and clarity to accurately reflect my thoughts  Lesleigh Noe MD-PhD [facility] [phone]

## 2020-04-20 NOTE — Progress Notes (Signed)
PROGRESS NOTE  GERVIS GABA  DOB: Nov 06, 1940  PCP: Harlan Stains, MD FIE:[id number]  DOA: 04/19/2020  LOS: 0 days   Chief Complaint  Patient presents with  . Leg Pain  . Weakness   Brief narrative: ESTILL LLERENA is a 79 y.o. 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 Patient presented to the ED on 12/14 with complaint of bilateral lower extremity weakness and difficulty walking associated with pain in the left hip region.  His symptoms started 24 hours prior to presentation while trying to get out of bed.  He managed to get out of bed and was able to ambulate without related to his living room.  He did not have a fall.  He continued to have pain for the whole day.  As at nighttime when he tried to get off his sofa, his legs gave out and he was completely unable to ambulate.  He has a history of neurofibroma tumor removal from his spine about 4 years ago and melanoma removal from right calf about 30 years ago. In the ED, x-rays and CT of left hip and pelvis were negative. INR 2.2. He was kept for overnight observation for further evaluation management.  MRI brain obtained this morning showed  1. Numerous scattered punctate acute infarctions in the left posterior frontal region consistent with micro embolic infarctions which could either be in the anterior or middle cerebral artery territory. Punctate acute infarction also affecting the splenium ofthe corpus callosum. 2. Extensive chronic small-vessel ischemic changes elsewherethroughout the brain. 3. Small old left parietal vertex cortical and subcorticalInfarctions.  MRI lumbar spine showed 1. 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 No recurrent spinal tumor is evident. 2. Superimposed lumbar spine degeneration primarily at L4-L5 and L5-S1. L4-L5 multifactorial mild to  moderate multifactorial spinal stenosis, mild right L4 neural foraminal stenosis. L5-S1 moderate right lateral recess and foraminal stenosis. Query Right L5 and/or S1 radiculitis.  Subjective: Patient was seen and examined this morning.  Pleasant elderly Caucasian male.  Sitting up in chair.  Not in distress.  Continues to have weakness in the right leg.  Family at bedside. Chart reviewed. No fever, heart rate in 70s, blood pressure 130s, breathing room air. Labs unremarkable this morning. MRI brain and MRI lumbar spine obtained this morning reviewed.  Findings as above including numerous punctate acute infarctions in the left posterior frontal region consistent with microembolic infarctions which could either be in the anterior middle cerebral artery debility Stroke work-up ordered.  Assessment/Plan: Acute CVA -Presented with weakness of lower extremities for 24 hours, impaired ambulation -MRI findings as above -Stroke work-up ordered including neurology, PT/OT eval, A1c, lipid panel, echo, carotid duplex. -Prior to admission, patient was already on Coumadin and Crestor.  INR therapeutic.  Hypertension -Home meds include Coreg and lisinopril.  Continue Coreg.  Keep lisinopril on hold. -Continue to monitor.  Blood clotting disorder, history of DVT/PE -On Coumadin.  Defer to neurology for further need of hypercoagulability work-up.  Mobility: PT eval pending Code Status:   Code Status: Full Code  Nutritional status: Body mass index is 21.44 kg/m.     Diet Order            Diet Heart Room service appropriate? Yes; Fluid consistency: Thin  Diet effective now  DVT prophylaxis:  warfarin (COUMADIN) tablet 2.5 mg   Antimicrobials:  None Fluid: None Consultants: Neurology Family Communication:  Family at bedside  Status is: Observation  The patient will require care spanning > 2 midnights and should be moved to inpatient because: New diagnosis of stroke.   Pending stroke work-up  Dispo: The patient is from: Home              Anticipated d/c is to: Home versus CIR versus SNF.  Pending PT work-up              Anticipated d/c date is: 2 days              Patient currently is not medically stable to d/c.       Infusions:    Scheduled Meds: . carvedilol  25 mg Oral BID WC  . pravastatin  20 mg Oral Daily  . warfarin  2.5 mg Oral q1600  . Warfarin - Physician Dosing Inpatient   Does not apply q1600    Antimicrobials: Anti-infectives (From admission, onward)   None      PRN meds: acetaminophen   Objective: Vitals:   04/20/20 0453 04/20/20 0913  BP: 139/82 (!) 120/51  Pulse: 81 72  Resp: 16 18  Temp: 97.9 F (36.6 C) 98.1 F (36.7 C)  SpO2: 91% 90%    Intake/Output Summary (Last 24 hours) at 04/20/2020 1242 Last data filed at 04/20/2020 0900 Gross per 24 hour  Intake 0 ml  Output 225 ml  Net -225 ml   Filed Weights   04/19/20 0043 04/20/20 0037  Weight: 77.1 kg 75.8 kg   Weight change: -1.361 kg Body mass index is 21.44 kg/m.   Physical Exam: General exam: Pleasant, elderly Caucasian male.  Not in distress Skin: No rashes, lesions or ulcers. HEENT: Atraumatic, normocephalic, no obvious bleeding Lungs: Clear to auscultation bilaterally CVS: Regular rate and rhythm, no murmur GI/Abd soft, nontender, nondistended, bowel sound present CNS: Alert, awake, oriented x3.  Right lower extremity weakness persistent. Psychiatry: Mood appropriate Extremities: No pedal edema, no calf tenderness.  Data Review: I have personally reviewed the laboratory data and studies available.  Recent Labs  Lab 04/19/20 0230 04/20/20 0216  WBC 10.8* 9.9  NEUTROABS 8.4*  --   HGB 16.5 15.7  HCT 49.0 48.5  MCV 91.2 91.3  PLT 224 195   Recent Labs  Lab 04/19/20 0230 04/20/20 0216  NA 139 141  K 4.0 4.6  CL 106 105  CO2 24 25  GLUCOSE 110* 95  BUN 19 15  CREATININE 0.86 0.94  CALCIUM 9.0 9.0    F/u labs  ordered.  Signed, Terrilee Croak, MD [facility] 04/20/2020

## 2020-04-20 NOTE — Evaluation (Signed)
Physical Therapy Evaluation Patient Details Name: John Lambert MRN: [id number] DOB: Nov 24, 1940 Today's Date: 04/20/2020   History of Present Illness  John Lambert is a 79 y.o. 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 He also has pain in left upper leg/hip region, and R foot drag; Imaging notable for Numerous scattered punctate acute infarctions in the left  posterior frontal region consistent with micro embolic infarctions  which could either be in the anterior or middle cerebral artery  territory. Punctate acute infarction also affecting the splenium of  the corpus callosum.  Clinical Impression   Pt admitted with above diagnosis. Comes from home where he lives with his wife in a single level home;  Was managing independently until a fall in recent months (he had gone to Outpt PT for gait and balance after that fall); Presents to PT with R sided weakness and dyscoordination, incr fall risk, suboptimal posture; Currently requires mod assist with upright activity and walking, tending to have a posterior lean, bias; Participated well, and has family assist at home;  Pt currently with functional limitations due to the deficits listed below (see PT Problem List). Pt will benefit from skilled PT to increase their independence and safety with mobility to allow discharge to the venue listed below.       Follow Up Recommendations CIR    Equipment Recommendations  Rolling walker with 5" wheels;3in1 (PT);Other (comment) (and to be determined, depending on progress)    Recommendations for Other Services Rehab consult     Precautions / Restrictions Precautions Precautions: Fall Restrictions Weight Bearing Restrictions: No      Mobility  Bed Mobility Overal bed mobility: Needs Assistance Bed Mobility: Rolling;Sidelying to  Sit Rolling: Min assist Sidelying to sit: Mod assist       General bed mobility comments: Cues for technqiue, and Mod assist to elevate trunk to sit; Close guard with reciprocal scooting to EOB, inefficient weight shifting for scooting -- energetically taxing    Transfers Overall transfer level: Needs assistance Equipment used: Rolling walker (2 wheeled) Transfers: Sit to/from Stand Sit to Stand: Mod assist;+2 safety/equipment         General transfer comment: Mod assist to power up and multimodal cues to initiate with forward lean; tendency for posterior bias; stands with bil knees in slightly bent position  Ambulation/Gait Ambulation/Gait assistance: Mod assist;+2 safety/equipment Gait Distance (Feet): 15 Feet Assistive device: Rolling walker (2 wheeled);2 person hand held assist Gait Pattern/deviations: Decreased step length - right;Step-to pattern;Decreased dorsiflexion - right Gait velocity: quite slow   General Gait Details: Tends to walk with upper trunk quite flexed; Decr R step length with decr foot clearance; Occasional posterior bias leading to loss of balance, mod assist to recover  Stairs            Wheelchair Mobility    Modified Rankin (Stroke Patients Only) Modified Rankin (Stroke Patients Only) Pre-Morbid Rankin Score: No significant disability Modified Rankin: Moderately severe disability     Balance Overall balance assessment: Needs assistance Sitting-balance support: Feet supported Sitting balance-Leahy Scale: Fair     Standing balance support: Bilateral upper extremity supported Standing balance-Leahy Scale: Poor Standing balance comment: Posterior lean; tends to stand with bil knees slightly flexed  Pertinent Vitals/Pain Pain Assessment: Faces Faces Pain Scale: Hurts little more Pain Location: L upper thigh with movement/weight bearing Pain Descriptors / Indicators: Discomfort;Grimacing Pain  Intervention(s): Monitored during session;Repositioned    Home Living Family/patient expects to be discharged to:: Private residence Living Arrangements: Spouse/significant other Available Help at Discharge: Family;Available 24 hours/day (at or near 24 hours) Type of Home: House Home Access: Stairs to enter   [employer] of Steps: 1 (through garage) Home Layout: One level Home Equipment: Walker - 4 wheels      Prior Function Level of Independence: Independent with assistive device(s)         Comments: Started using Rollator RW a few months ago after a fall     Hand Dominance        Extremity/Trunk Assessment   Upper Extremity Assessment Upper Extremity Assessment: Defer to OT evaluation (Pt noted difficulty opening things with R hand recently)    Lower Extremity Assessment Lower Extremity Assessment: RLE deficits/detail;LLE deficits/detail RLE Deficits / Details: Notable decr strength, grossly 3/5 throughout; Dyscoordination noted with Heel to [place] RLE Coordination: decreased fine motor;decreased gross motor LLE Deficits / Details: Notably decr AROM against gravity due to L upper thigh dicomfort with movement; Opted not to MMT due to this pain; Still, better coordination than RLE with stepping LLE: Unable to fully assess due to pain    Cervical / Trunk Assessment Cervical / Trunk Assessment: Kyphotic  Communication   Communication: No difficulties  Cognition Arousal/Alertness: Awake/alert Behavior During Therapy: WFL for tasks assessed/performed Overall Cognitive Status: Within Functional Limits for tasks assessed                                        General Comments General comments (skin integrity, edema, etc.): Received in bed, had been incontinent of urine; assisted with hygeine    Exercises     Assessment/Plan    PT Assessment Patient needs continued PT services  PT Problem List Decreased strength;Decreased range of  motion;Decreased activity tolerance;Decreased balance;Decreased coordination;Decreased mobility;Decreased knowledge of use of DME;Decreased safety awareness;Decreased knowledge of precautions;Pain       PT Treatment Interventions DME instruction;Gait training;Functional mobility training;Stair training;Therapeutic activities;Therapeutic exercise;Balance training;Neuromuscular re-education;Cognitive remediation;Patient/family education;Wheelchair mobility training    PT Goals (Current goals can be found in the Care Plan section)  Acute Rehab PT Goals Patient Stated Goal: Be safe walking; to not fall PT Goal Formulation: With patient Time For Goal Achievement: 05/04/20 Potential to Achieve Goals: Good    Frequency Min 4X/week   Barriers to discharge        Co-evaluation               AM-PAC PT "6 Clicks" Mobility  Outcome Measure Help needed turning from your back to your side while in a flat bed without using bedrails?: A Little Help needed moving from lying on your back to sitting on the side of a flat bed without using bedrails?: A Lot Help needed moving to and from a bed to a chair (including a wheelchair)?: A Lot Help needed standing up from a chair using your arms (e.g., wheelchair or bedside chair)?: A Lot Help needed to walk in hospital room?: A Little Help needed climbing 3-5 steps with a railing? : A Lot 6 Click Score: 14    End of Session Equipment Utilized During Treatment: Gait belt Activity Tolerance: Patient tolerated treatment well Patient left: in  chair;with call bell/phone within reach;with chair alarm set Nurse Communication: Mobility status PT Visit Diagnosis: Other abnormalities of gait and mobility (R26.89);Muscle weakness (generalized) (M62.81);History of falling (Z91.81)    Time: 7416-3845 PT Time Calculation (min) (ACUTE ONLY): 30 min   Charges:   PT Evaluation $PT Eval Moderate Complexity: 1 Mod PT Treatments $Gait Training: 8-22 mins         Roney Marion, PT  Acute Rehabilitation Services Pager [phone] Office [phone]   Colletta [place] 04/20/2020, 11:01 AM

## 2020-04-21 ENCOUNTER — Other Ambulatory Visit: Payer: Self-pay

## 2020-04-21 DIAGNOSIS — E872 Acidosis: Secondary | ICD-10-CM | POA: Diagnosis not present

## 2020-04-21 DIAGNOSIS — N182 Chronic kidney disease, stage 2 (mild): Secondary | ICD-10-CM | POA: Diagnosis present

## 2020-04-21 DIAGNOSIS — Z515 Encounter for palliative care: Secondary | ICD-10-CM | POA: Diagnosis not present

## 2020-04-21 DIAGNOSIS — R531 Weakness: Secondary | ICD-10-CM | POA: Diagnosis not present

## 2020-04-21 DIAGNOSIS — J9811 Atelectasis: Secondary | ICD-10-CM | POA: Diagnosis not present

## 2020-04-21 DIAGNOSIS — G8191 Hemiplegia, unspecified affecting right dominant side: Secondary | ICD-10-CM | POA: Diagnosis not present

## 2020-04-21 DIAGNOSIS — R29701 NIHSS score 1: Secondary | ICD-10-CM | POA: Diagnosis present

## 2020-04-21 DIAGNOSIS — G031 Chronic meningitis: Secondary | ICD-10-CM | POA: Diagnosis present

## 2020-04-21 DIAGNOSIS — I10 Essential (primary) hypertension: Secondary | ICD-10-CM | POA: Diagnosis not present

## 2020-04-21 DIAGNOSIS — Z7901 Long term (current) use of anticoagulants: Secondary | ICD-10-CM | POA: Diagnosis not present

## 2020-04-21 DIAGNOSIS — F32A Depression, unspecified: Secondary | ICD-10-CM | POA: Diagnosis present

## 2020-04-21 DIAGNOSIS — R1312 Dysphagia, oropharyngeal phase: Secondary | ICD-10-CM | POA: Diagnosis not present

## 2020-04-21 DIAGNOSIS — I6340 Cerebral infarction due to embolism of unspecified cerebral artery: Secondary | ICD-10-CM | POA: Diagnosis present

## 2020-04-21 DIAGNOSIS — Z20822 Contact with and (suspected) exposure to covid-19: Secondary | ICD-10-CM | POA: Diagnosis present

## 2020-04-21 DIAGNOSIS — E870 Hyperosmolality and hypernatremia: Secondary | ICD-10-CM | POA: Diagnosis not present

## 2020-04-21 DIAGNOSIS — N136 Pyonephrosis: Secondary | ICD-10-CM | POA: Diagnosis not present

## 2020-04-21 DIAGNOSIS — I6522 Occlusion and stenosis of left carotid artery: Secondary | ICD-10-CM | POA: Diagnosis present

## 2020-04-21 DIAGNOSIS — J9601 Acute respiratory failure with hypoxia: Secondary | ICD-10-CM | POA: Diagnosis not present

## 2020-04-21 DIAGNOSIS — D62 Acute posthemorrhagic anemia: Secondary | ICD-10-CM | POA: Diagnosis not present

## 2020-04-21 DIAGNOSIS — N179 Acute kidney failure, unspecified: Secondary | ICD-10-CM | POA: Diagnosis not present

## 2020-04-21 DIAGNOSIS — I63419 Cerebral infarction due to embolism of unspecified middle cerebral artery: Secondary | ICD-10-CM | POA: Diagnosis not present

## 2020-04-21 DIAGNOSIS — R0600 Dyspnea, unspecified: Secondary | ICD-10-CM | POA: Diagnosis not present

## 2020-04-21 DIAGNOSIS — G9349 Other encephalopathy: Secondary | ICD-10-CM | POA: Diagnosis not present

## 2020-04-21 DIAGNOSIS — I6602 Occlusion and stenosis of left middle cerebral artery: Secondary | ICD-10-CM | POA: Diagnosis not present

## 2020-04-21 DIAGNOSIS — D689 Coagulation defect, unspecified: Secondary | ICD-10-CM | POA: Diagnosis not present

## 2020-04-21 DIAGNOSIS — R279 Unspecified lack of coordination: Secondary | ICD-10-CM | POA: Diagnosis not present

## 2020-04-21 DIAGNOSIS — I6529 Occlusion and stenosis of unspecified carotid artery: Secondary | ICD-10-CM | POA: Diagnosis not present

## 2020-04-21 DIAGNOSIS — I69391 Dysphagia following cerebral infarction: Secondary | ICD-10-CM | POA: Diagnosis not present

## 2020-04-21 DIAGNOSIS — I129 Hypertensive chronic kidney disease with stage 1 through stage 4 chronic kidney disease, or unspecified chronic kidney disease: Secondary | ICD-10-CM | POA: Diagnosis present

## 2020-04-21 DIAGNOSIS — R278 Other lack of coordination: Secondary | ICD-10-CM | POA: Diagnosis not present

## 2020-04-21 DIAGNOSIS — N390 Urinary tract infection, site not specified: Secondary | ICD-10-CM | POA: Diagnosis not present

## 2020-04-21 DIAGNOSIS — Z006 Encounter for examination for normal comparison and control in clinical research program: Secondary | ICD-10-CM | POA: Diagnosis not present

## 2020-04-21 DIAGNOSIS — M6281 Muscle weakness (generalized): Secondary | ICD-10-CM | POA: Diagnosis not present

## 2020-04-21 DIAGNOSIS — I63132 Cerebral infarction due to embolism of left carotid artery: Secondary | ICD-10-CM | POA: Diagnosis not present

## 2020-04-21 DIAGNOSIS — R5381 Other malaise: Secondary | ICD-10-CM | POA: Diagnosis not present

## 2020-04-21 DIAGNOSIS — R1310 Dysphagia, unspecified: Secondary | ICD-10-CM | POA: Diagnosis not present

## 2020-04-21 DIAGNOSIS — Y92009 Unspecified place in unspecified non-institutional (private) residence as the place of occurrence of the external cause: Secondary | ICD-10-CM | POA: Diagnosis not present

## 2020-04-21 DIAGNOSIS — Y831 Surgical operation with implant of artificial internal device as the cause of abnormal reaction of the patient, or of later complication, without mention of misadventure at the time of the procedure: Secondary | ICD-10-CM | POA: Diagnosis not present

## 2020-04-21 DIAGNOSIS — E785 Hyperlipidemia, unspecified: Secondary | ICD-10-CM | POA: Diagnosis present

## 2020-04-21 DIAGNOSIS — R945 Abnormal results of liver function studies: Secondary | ICD-10-CM | POA: Diagnosis not present

## 2020-04-21 DIAGNOSIS — I639 Cerebral infarction, unspecified: Secondary | ICD-10-CM | POA: Diagnosis not present

## 2020-04-21 DIAGNOSIS — Z7189 Other specified counseling: Secondary | ICD-10-CM | POA: Diagnosis not present

## 2020-04-21 DIAGNOSIS — R310 Gross hematuria: Secondary | ICD-10-CM | POA: Diagnosis not present

## 2020-04-21 DIAGNOSIS — I69320 Aphasia following cerebral infarction: Secondary | ICD-10-CM | POA: Diagnosis not present

## 2020-04-21 DIAGNOSIS — I63412 Cerebral infarction due to embolism of left middle cerebral artery: Secondary | ICD-10-CM | POA: Diagnosis not present

## 2020-04-21 DIAGNOSIS — I69351 Hemiplegia and hemiparesis following cerebral infarction affecting right dominant side: Secondary | ICD-10-CM | POA: Diagnosis not present

## 2020-04-21 DIAGNOSIS — W19XXXA Unspecified fall, initial encounter: Secondary | ICD-10-CM | POA: Diagnosis not present

## 2020-04-21 DIAGNOSIS — I63411 Cerebral infarction due to embolism of right middle cerebral artery: Secondary | ICD-10-CM | POA: Diagnosis not present

## 2020-04-21 DIAGNOSIS — R0602 Shortness of breath: Secondary | ICD-10-CM | POA: Diagnosis not present

## 2020-04-21 DIAGNOSIS — R059 Cough, unspecified: Secondary | ICD-10-CM | POA: Diagnosis not present

## 2020-04-21 DIAGNOSIS — R262 Difficulty in walking, not elsewhere classified: Secondary | ICD-10-CM | POA: Diagnosis not present

## 2020-04-21 DIAGNOSIS — I97821 Postprocedural cerebrovascular infarction during other surgery: Secondary | ICD-10-CM | POA: Diagnosis not present

## 2020-04-21 DIAGNOSIS — I480 Paroxysmal atrial fibrillation: Secondary | ICD-10-CM | POA: Diagnosis not present

## 2020-04-21 DIAGNOSIS — U071 COVID-19: Secondary | ICD-10-CM | POA: Diagnosis not present

## 2020-04-21 DIAGNOSIS — R17 Unspecified jaundice: Secondary | ICD-10-CM | POA: Diagnosis not present

## 2020-04-21 LAB — BASIC METABOLIC PANEL
Anion gap: 12 (ref 5–15)
BUN: 15 mg/dL (ref 8–23)
CO2: 25 mmol/L (ref 22–32)
Calcium: 8.9 mg/dL (ref 8.9–10.3)
Chloride: 103 mmol/L (ref 98–111)
Creatinine, Ser: 0.88 mg/dL (ref 0.61–1.24)
GFR, Estimated: >60 mL/min (ref >60)
Glucose, Bld: 94 mg/dL (ref 70–99)
Potassium: 4.3 mmol/L (ref 3.5–5.1)
Sodium: 140 mmol/L (ref 135–145)

## 2020-04-21 LAB — CBC WITH DIFFERENTIAL/PLATELET
Abs Immature Granulocytes: 0.03 10*3/uL (ref 0.00–0.07)
Basophils Absolute: 0 10*3/uL (ref 0.0–0.1)
Basophils Relative: 1 %
Eosinophils Absolute: 0.2 10*3/uL (ref 0.0–0.5)
Eosinophils Relative: 2 %
HCT: 46.5 % (ref 39.0–52.0)
Hemoglobin: 15.9 g/dL (ref 13.0–17.0)
Immature Granulocytes: 0 %
Lymphocytes Relative: 17 %
Lymphs Abs: 1.3 10*3/uL (ref 0.7–4.0)
MCH: 30.5 pg (ref 26.0–34.0)
MCHC: 34.2 g/dL (ref 30.0–36.0)
MCV: 89.3 fL (ref 80.0–100.0)
Monocytes Absolute: 0.8 10*3/uL (ref 0.1–1.0)
Monocytes Relative: 9 %
Neutro Abs: 5.7 10*3/uL (ref 1.7–7.7)
Neutrophils Relative %: 71 %
Platelets: 216 10*3/uL (ref 150–400)
RBC: 5.21 MIL/uL (ref 4.22–5.81)
RDW: 12.9 % (ref 11.5–15.5)
WBC: 8 10*3/uL (ref 4.0–10.5)
nRBC: 0 % (ref 0.0–0.2)

## 2020-04-21 LAB — PROTIME-INR
INR: 2.1 — ABNORMAL HIGH (ref 0.8–1.2)
Prothrombin Time: 23.1 seconds — ABNORMAL HIGH (ref 11.4–15.2)

## 2020-04-21 LAB — LIPID PANEL
Cholesterol: 136 mg/dL (ref 0–200)
HDL: 37 mg/dL — ABNORMAL LOW (ref >40)
LDL Cholesterol: 84 mg/dL (ref 0–99)
Total CHOL/HDL Ratio: 3.7 RATIO
Triglycerides: 73 mg/dL (ref <150)
VLDL: 15 mg/dL (ref 0–40)

## 2020-04-21 LAB — MAGNESIUM: Magnesium: 1.9 mg/dL (ref 1.7–2.4)

## 2020-04-21 LAB — PHOSPHORUS: Phosphorus: 3.5 mg/dL (ref 2.5–4.6)

## 2020-04-21 MED ORDER — PRAVASTATIN SODIUM 40 MG PO TABS
40.0000 mg | ORAL_TABLET | Freq: Every day | ORAL | Status: DC
  Administered 2020-04-21 – 2020-05-03 (×11): 40 mg via ORAL
  Filled 2020-04-21 (×12): qty 1

## 2020-04-21 NOTE — Progress Notes (Signed)
    Subjective  -   No acute overnight events   Physical Exam:  Neuro exam remained stable.  Still with some mild right-sided weakness       Assessment/Plan:    Symptomatic left carotid stenosis.  I again had a lengthy discussion with the patient, his wife and daughter.  We discussed proceeding with a left sided TCAR next week.  I again detailed the procedure as well as the risks and benefits including the risk of bleeding and stroke.  The daughter and wife want to proceed but patient is still undecided.  I told him that I would tentatively place him on the OR schedule for next Wednesday for a left sided TCAR.  We discussed that in order to proceed with TCAR, he would need to be on aspirin and Plavix as well as a statin.  He would also need to come off of his Coumadin, and be bridged with Lovenox.  I am okay if he is discharged from the hospital prior to this.  I will not be available until Monday to discuss this again however my team will stop by to see if he makes a decision.  If he is discharged with plans for surgery, he will need Lovenox for a bridge and then stop his Coumadin 4 days prior to surgery.  He will also need a prescription and to start taking aspirin and Plavix.  Wells Romyn Boswell 04/21/2020 2:38 PM --  Vitals:   04/21/20 0850 04/21/20 1129  BP: (!) 149/87 108/62  Pulse: 75 75  Resp:  18  Temp:  98 F (36.7 C)  SpO2:  95%    Intake/Output Summary (Last 24 hours) at 04/21/2020 1438 Last data filed at 04/21/2020 0413 Gross per 24 hour  Intake 120 ml  Output 500 ml  Net -380 ml     Laboratory CBC    Component Value Date/Time   WBC 8.0 04/21/2020 0423   HGB 15.9 04/21/2020 0423   HCT 46.5 04/21/2020 0423   PLT 216 04/21/2020 0423    BMET    Component Value Date/Time   NA 140 04/21/2020 0423   K 4.3 04/21/2020 0423   CL 103 04/21/2020 0423   CO2 25 04/21/2020 0423   GLUCOSE 94 04/21/2020 0423   BUN 15 04/21/2020 0423   CREATININE 0.88 04/21/2020  0423   CALCIUM 8.9 04/21/2020 0423   GFRNONAA >60 04/21/2020 0423    COAG Lab Results  Component Value Date   INR 2.1 (H) 04/21/2020   INR 2.0 (H) 04/20/2020   INR 2.2 (H) 04/19/2020   PROTIME 39.6 (H) 03/19/2007   No results found for: PTT  Antibiotics Anti-infectives (From admission, onward)   None       V. Leia Alf, M.D., [facility] [place] Office: [phone] Pager:  [phone]

## 2020-04-21 NOTE — Progress Notes (Signed)
STROKE TEAM PROGRESS NOTE   INTERVAL HISTORY Wife and daughter are at the bedside. Pt sitting in chair. Dr. Trula Slade later stepped into the room too. Pt neuro intact, stated that he feels good and back to baseline. However, complains of left thigh pain which limited his left leg movement. On coumadin, INR 2.1 today. He will discuss with Dr. Trula Slade about left carotid revascularization procedure.   Vitals:   04/21/20 0413 04/21/20 0751 04/21/20 0850 04/21/20 1129  BP: 140/77 128/63 (!) 149/87 108/62  Pulse: 75 70 75 75  Resp: 17 18  18   Temp: 98.2 F (36.8 C) 97.8 F (36.6 C)  98 F (36.7 C)  TempSrc: Oral Oral  Oral  SpO2: 94% 90%  95%  Weight:      Height:       CBC:  Recent Labs  Lab 04/19/20 0230 04/20/20 0216 04/21/20 0423  WBC 10.8* 9.9 8.0  NEUTROABS 8.4*  --  5.7  HGB 16.5 15.7 15.9  HCT 49.0 48.5 46.5  MCV 91.2 91.3 89.3  PLT 224 195 657   Basic Metabolic Panel:  Recent Labs  Lab 04/20/20 0216 04/21/20 0423  NA 141 140  K 4.6 4.3  CL 105 103  CO2 25 25  GLUCOSE 95 94  BUN 15 15  CREATININE 0.94 0.88  CALCIUM 9.0 8.9  MG  --  1.9  PHOS  --  3.5   Lipid Panel:  Recent Labs  Lab 04/21/20 0423  CHOL 136  TRIG 73  HDL 37*  CHOLHDL 3.7  VLDL 15  LDLCALC 84   HgbA1c:  Recent Labs  Lab 04/20/20 0216  HGBA1C 5.3   Urine Drug Screen: No results for input(s): LABOPIA, COCAINSCRNUR, LABBENZ, AMPHETMU, THCU, LABBARB in the last 168 hours.  Alcohol Level No results for input(s): ETH in the last 168 hours.  IMAGING past 24 hours CT ANGIO HEAD W OR WO CONTRAST  Result Date: 04/20/2020 CLINICAL DATA:  Stroke/TIA. EXAM: CT ANGIOGRAPHY HEAD AND NECK TECHNIQUE: Multidetector CT imaging of the head and neck was performed using the standard protocol during bolus administration of intravenous contrast. Multiplanar CT image reconstructions and MIPs were obtained to evaluate the vascular anatomy. Carotid stenosis measurements (when applicable) are obtained  utilizing NASCET criteria, using the distal internal carotid diameter as the denominator. CONTRAST:  64mL OMNIPAQUE IOHEXOL 350 MG/ML SOLN COMPARISON:  MRI head from the same day. FINDINGS: CTA NECK FINDINGS Aortic arch: Great vessel origins are patent. Right carotid system: No evidence of dissection, stenosis (50% or greater) or occlusion. Left carotid system: Ulcerated atherosclerosis at the carotid bifurcation with resulting severe (approximately 80%) stenosis of the proximal internal carotid artery. Vertebral arteries: Mildly left dominant. No evidence of dissection, stenosis (50% or greater) or occlusion. Skeleton: Multilevel degenerative changes of the spine. Flowing anterior disc osteophytes, compatible with diffuse idiopathic skeletal hyperostosis. No evidence of acute abnormality. Other neck: No mass or suspicious adenopathy. Upper chest: Partially imaged ground-glass opacities in the right upper lung. Review of the MIP images confirms the above findings CTA HEAD FINDINGS Anterior circulation: No significant stenosis, proximal occlusion, aneurysm, or vascular malformation. Absent right A1 ACA with the right A2 ACA rising from the dominant left A1 ACA, anatomic variant. Posterior circulation: Moderate stenosis of the intradural left vertebral artery. Right fetal type PCA with small right P1 PCA. Venous sinuses: As permitted by contrast timing, patent. Review of the MIP images confirms the above findings IMPRESSION: 1. Ulcerated atherosclerosis at the left carotid bifurcation with  resulting severe (approximately 80%) stenosis of the proximal internal carotid artery. 2. Moderate stenosis of the intradural left vertebral artery. 3. Intracranially, no evidence of large vessel occlusion or hemodynamically significant proximal stenosis. 4. Partially imaged ground-glass opacities in the right upper lung, which may be infectious or inflammatory. Recommend dedicated chest imaging to further evaluate. Electronically  Signed   By: Margaretha Sheffield MD   On: 04/20/2020 16:34   CT ANGIO NECK W OR WO CONTRAST  Result Date: 04/20/2020 CLINICAL DATA:  Stroke/TIA. EXAM: CT ANGIOGRAPHY HEAD AND NECK TECHNIQUE: Multidetector CT imaging of the head and neck was performed using the standard protocol during bolus administration of intravenous contrast. Multiplanar CT image reconstructions and MIPs were obtained to evaluate the vascular anatomy. Carotid stenosis measurements (when applicable) are obtained utilizing NASCET criteria, using the distal internal carotid diameter as the denominator. CONTRAST:  22mL OMNIPAQUE IOHEXOL 350 MG/ML SOLN COMPARISON:  MRI head from the same day. FINDINGS: CTA NECK FINDINGS Aortic arch: Great vessel origins are patent. Right carotid system: No evidence of dissection, stenosis (50% or greater) or occlusion. Left carotid system: Ulcerated atherosclerosis at the carotid bifurcation with resulting severe (approximately 80%) stenosis of the proximal internal carotid artery. Vertebral arteries: Mildly left dominant. No evidence of dissection, stenosis (50% or greater) or occlusion. Skeleton: Multilevel degenerative changes of the spine. Flowing anterior disc osteophytes, compatible with diffuse idiopathic skeletal hyperostosis. No evidence of acute abnormality. Other neck: No mass or suspicious adenopathy. Upper chest: Partially imaged ground-glass opacities in the right upper lung. Review of the MIP images confirms the above findings CTA HEAD FINDINGS Anterior circulation: No significant stenosis, proximal occlusion, aneurysm, or vascular malformation. Absent right A1 ACA with the right A2 ACA rising from the dominant left A1 ACA, anatomic variant. Posterior circulation: Moderate stenosis of the intradural left vertebral artery. Right fetal type PCA with small right P1 PCA. Venous sinuses: As permitted by contrast timing, patent. Review of the MIP images confirms the above findings IMPRESSION: 1. Ulcerated  atherosclerosis at the left carotid bifurcation with resulting severe (approximately 80%) stenosis of the proximal internal carotid artery. 2. Moderate stenosis of the intradural left vertebral artery. 3. Intracranially, no evidence of large vessel occlusion or hemodynamically significant proximal stenosis. 4. Partially imaged ground-glass opacities in the right upper lung, which may be infectious or inflammatory. Recommend dedicated chest imaging to further evaluate. Electronically Signed   By: Margaretha Sheffield MD   On: 04/20/2020 16:34   VAS [place] LOWER EXTREMITY VENOUS (DVT)  Result Date: 04/20/2020  Lower Venous DVT Study Indications: Swelling. Other Indications: History of DVT and PE. Risk Factors: Cancer Hx melanoma removed from RT calf. Anticoagulation: Long term coumadin. Comparison Study: No prior studies. Performing Technologist: Darlin Coco RDMS  Examination Guidelines: A complete evaluation includes B-mode imaging, spectral Doppler, color Doppler, and power Doppler as needed of all accessible portions of each vessel. Bilateral testing is considered an integral part of a complete examination. Limited examinations for reoccurring indications may be performed as noted. The reflux portion of the exam is performed with the patient in reverse Trendelenburg.  +---------+---------------+---------+-----------+----------+-----------------+ RIGHT    CompressibilityPhasicitySpontaneityPropertiesThrombus Aging    +---------+---------------+---------+-----------+----------+-----------------+ CFV      Partial        Yes      Yes                  Age Indeterminate +---------+---------------+---------+-----------+----------+-----------------+ SFJ  Yes      Yes                                    +---------+---------------+---------+-----------+----------+-----------------+ FV Prox                 Yes      Yes                                     +---------+---------------+---------+-----------+----------+-----------------+ FV Mid                  Yes      Yes                                    +---------+---------------+---------+-----------+----------+-----------------+ FV Distal               Yes      Yes                                    +---------+---------------+---------+-----------+----------+-----------------+ PFV                     Yes      Yes                                    +---------+---------------+---------+-----------+----------+-----------------+ POP      Partial        Yes      Yes                  Age Indeterminate +---------+---------------+---------+-----------+----------+-----------------+ PTV                     Yes      Yes                                    +---------+---------------+---------+-----------+----------+-----------------+ PERO                    Yes      Yes                                    +---------+---------------+---------+-----------+----------+-----------------+   +---------+---------------+---------+-----------+----------+--------------+ LEFT     CompressibilityPhasicitySpontaneityPropertiesThrombus Aging +---------+---------------+---------+-----------+----------+--------------+ CFV      Full           Yes      Yes                                 +---------+---------------+---------+-----------+----------+--------------+ SFJ      Full                                                        +---------+---------------+---------+-----------+----------+--------------+ FV Prox  Full                                                        +---------+---------------+---------+-----------+----------+--------------+  FV Mid   Full                                                        +---------+---------------+---------+-----------+----------+--------------+ FV DistalFull                                                         +---------+---------------+---------+-----------+----------+--------------+ PFV      Full                                                        +---------+---------------+---------+-----------+----------+--------------+ POP      Full           Yes      Yes                                 +---------+---------------+---------+-----------+----------+--------------+ PTV      Full                                                        +---------+---------------+---------+-----------+----------+--------------+ PERO     Full                                                        +---------+---------------+---------+-----------+----------+--------------+     Summary: RIGHT: - Findings consistent with partial, non-obstructing age indeterminate deep vein thrombosis involving the right common femoral vein, and right popliteal vein. - No cystic structure found in the popliteal fossa.  LEFT: - No evidence of common femoral vein obstruction.  *See table(s) above for measurements and observations. Electronically signed by Harold Barban MD on 04/20/2020 at 10:04:28 PM.    Final     PHYSICAL EXAM  Temp:  [97.8 F (36.6 C)-98.9 F (37.2 C)] 98 F (36.7 C) (12/16 1129) Pulse Rate:  [69-75] 75 (12/16 1129) Resp:  [17-20] 18 (12/16 1129) BP: (108-149)/(61-87) 108/62 (12/16 1129) SpO2:  [90 %-95 %] 95 % (12/16 1129) Weight:  [79.7 kg] 79.7 kg (12/15 2034)  General - Well nourished, well developed, in no apparent distress.  Ophthalmologic - fundi not visualized due to noncooperation.  Cardiovascular - Regular rhythm and rate.  Mental Status -  Level of arousal and orientation to time, place, and person were intact. Language including expression, naming, repetition, comprehension was assessed and found intact.  Cranial Nerves II - XII - II - Visual field intact OU. III, IV, VI - Extraocular movements intact. V - Facial sensation intact bilaterally. VII - Facial movement intact  bilaterally. VIII - Hearing & vestibular intact bilaterally. X - Palate elevates symmetrically. XI - Chin turning & shoulder shrug intact bilaterally.  XII - Tongue protrusion intact.  Motor Strength - The patient's strength was normal in all extremities and pronator drift was absent except left thigh pain which made his left LE proximal 4/5 but distal ankle DF/PF still 5/5.  Bulk was normal and fasciculations were absent.   Motor Tone - Muscle tone was assessed at the neck and appendages and was normal.  Reflexes - The patient's reflexes were symmetrical in all extremities and he had no pathological reflexes.  Sensory - Light touch, temperature/pinprick were assessed and were symmetrical.    Coordination - The patient had normal movements in the hands with no ataxia or dysmetria.  Tremor was absent.  Gait and Station - deferred.   ASSESSMENT/PLAN Mr. John Lambert is a 79 y.o. 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 Old L parietal cortical and subcortical infarcts.  CTA head & neck ulcerated L ICA bifurcation w/ severe proximal 80% stenosis. Moderate intradural L VA stenosis. RUL w/ ground glass appearance.   Carotid Doppler  L ICA 80-99%  2D Echo w/ bubble EF 60-65%. No source of embolus   LDL 84  HgbA1c 5.3  VTE prophylaxis - warfarin  warfarin daily prior to admission, now on warfarin daily.   Therapy recommendations:  CIR  Disposition:  pending    Symptomatic L ICA Stenosis   Carotid Doppler  L ICA 80-99%  VVS consulted ([facility])  Family considering TCAR - pt not decided  Per Dr. Trula Slade, if TCAR, pt needs to be on ASA, plavix and anticoagulation triple therapy for one month.   Blood Clotting D/O Hx DVT, PE  Home anticoagulation:  warfarin daily, continued in the hospital  Admission INR 2.2, now 2.1 . LE doppler partial non-obstructed age indeterminate DVT R common femoral and R popliteal vein, likely chronic  . For lovenox bridge if pt decided on TCAR   Hypertension  Stable . BP goal before TCAR 130-160, after TCAR normotensive  Hyperlipidemia  Home meds:  pravachol 20  Now on pravachol 40  LDL 84, goal < 70  Continue statin at discharge  Other Stroke Risk Factors  Advanced Age >/= 28   Former Cigarette smoker  ETOH use, advised to drink no more than 2 drink(s) a day  Hospital day # 0  Neurology will sign off. Please call with questions. Pt will follow up with stroke clinic NP at [facility] in about 4 weeks. Thanks for the consult.  Rosalin Hawking, MD PhD Stroke Neurology 04/21/2020 3:00 PM    To contact Stroke Continuity provider, please refer to [email or web address] After hours, contact General Neurology

## 2020-04-21 NOTE — Evaluation (Signed)
SLP Cancellation Note  Patient Details Name: John Lambert MRN: [id number] DOB: 07-17-1940   Cancelled treatment:       Reason Eval/Treat Not Completed: Other (comment) (pt working with PT and OT at this time, will continue efforts)   Macario Golds 04/21/2020, 9:50 AM

## 2020-04-21 NOTE — Progress Notes (Signed)
Physical Therapy Treatment Patient Details Name: John Lambert MRN: [id number] DOB: 03-04-1941 Today's Date: 04/21/2020    History of Present Illness John Lambert is a 79 y.o. 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 He also has pain in left upper leg/hip region, and R foot drag; Imaging notable for Numerous scattered punctate acute infarctions in the left  posterior frontal region consistent with micro embolic infarctions  which could either be in the anterior or middle cerebral artery  territory. Punctate acute infarction also affecting the splenium of  the corpus callosum.    PT Comments    Treated pt in conjunction with OT this date to maximize quality of session and tolerance. Pt is making progress towards his goals as he was able to transition supine > sit EOB with use of UEs to pull up with HOB elevated with min guard assist, transfer sit to stand from EOB with minAx2 and from chair with arm rests with min guard assist, and ambulate x2 bouts of ~10 ft > ~80 ft with a RW with minAx2. He demonstrates possible R quad and glut weakness with L hip flexor weakness through maintaining R knee flexion majority of all standing and ambulation bouts along with decreased R stance time and performing a step-to gait pattern on his L leg. Provided verbal and tactile cues at pelvis and L hamstring to improve R weight shift and L leg advancement with gait, but pt tends to resist physically and has poor carryover. He required minAx2 for balance and safety with gait, displaying several bouts of LOB. Will continue to follow acutely. Current recommendations remain appropriate.   Follow Up Recommendations  CIR     Equipment Recommendations  Rolling walker with 5" wheels;3in1 (PT);Other (comment) (and to be determined, depending on progress)     Recommendations for Other Services Rehab consult     Precautions / Restrictions Precautions Precautions: Fall Restrictions Weight Bearing Restrictions: No    Mobility  Bed Mobility Overal bed mobility: Needs Assistance Bed Mobility: Supine to Sit     Supine to sit: Min guard;HOB elevated     General bed mobility comments: Pt reached L hand for hand of therapist to pull himself up to sit EOB with HOB elevated. Did not require assistance from therapist other than to be support and thus most likely could transition to sit EOB pulling on bed rail instead, min guard for safety.  Transfers Overall transfer level: Needs assistance Equipment used: Rolling walker (2 wheeled) Transfers: Sit to/from Stand Sit to Stand: +2 safety/equipment;Min assist         General transfer comment: MinAx2 to come to stand from EOB, with increased time to power up and knee flexion bias noted. Coming to stand from chair with UEs on arm rests with min guard assist for safety.  Ambulation/Gait Ambulation/Gait assistance: +2 safety/equipment;Min assist;+2 physical assistance Gait Distance (Feet): 80 Feet (x2 bouts of ~10 ft > ~80) Assistive device: Rolling walker (2 wheeled) Gait Pattern/deviations: Step-to pattern;Decreased step length - left;Decreased weight shift to right;Narrow base of support;Trunk flexed;Shuffle Gait velocity: decreased Gait velocity interpretation: <1.8 ft/sec, indicate of risk for recurrent falls General Gait Details: Ambulates with trunk flexed and step-to pattern on L with decreased weight shift and stance time on R. Demonstrates tendency to shuffle feet and maintain R knee flexed throughout. Provided verbal and tactile  cues at bilat pelvis and L hamstring to improve weight shift and leg advancement, with noted physical resistance at times and min success with poor carryover noted. LOB intermittently when being provided cues thus minAx2 for safety.   Stairs              Wheelchair Mobility    Modified Rankin (Stroke Patients Only) Modified Rankin (Stroke Patients Only) Pre-Morbid Rankin Score: No significant disability Modified Rankin: Moderately severe disability     Balance Overall balance assessment: Needs assistance Sitting-balance support: Feet supported Sitting balance-Leahy Scale: Fair Sitting balance - Comments: Sitting EOB no overt LOB, supervision for safety.   Standing balance support: No upper extremity supported;Single extremity supported;Bilateral upper extremity supported Standing balance-Leahy Scale: Fair Standing balance comment: Pt able to reach minimally off BOS standing at sink working with OT, alternating 2-no UE support, but during bouts of no UE support he would tend to lean anteriorly placving his hips on edge of sink for support. Stands with R knee flexed and R leg bias.                            Cognition Arousal/Alertness: Awake/alert Behavior During Therapy: WFL for tasks assessed/performed Overall Cognitive Status: Within Functional Limits for tasks assessed                                        Exercises      General Comments        Pertinent Vitals/Pain Pain Assessment: Faces Faces Pain Scale: Hurts a little bit Pain Location: L upper thigh with movement/weight bearing Pain Descriptors / Indicators: Discomfort;Grimacing Pain Intervention(s): Limited activity within patient's tolerance;Monitored during session;Repositioned    Home Living                      Prior Function            PT Goals (current goals can now be found in the care plan section) Acute Rehab PT Goals Patient Stated Goal: Be safe walking; to not fall PT Goal Formulation: With patient Time For Goal Achievement: 05/04/20 Potential to Achieve Goals: Good Progress towards PT goals: Progressing toward goals    Frequency    Min 4X/week      PT Plan      Co-evaluation PT/OT/SLP  Co-Evaluation/Treatment: Yes Reason for Co-Treatment: For patient/therapist safety;To address functional/ADL transfers PT goals addressed during session: Mobility/safety with mobility;Balance;Proper use of DME        AM-PAC PT "6 Clicks" Mobility   Outcome Measure  Help needed turning from your back to your side while in a flat bed without using bedrails?: A Little Help needed moving from lying on your back to sitting on the side of a flat bed without using bedrails?: A Lot Help needed moving to and from a bed to a chair (including a wheelchair)?: A Little Help needed standing up from a chair using your arms (e.g., wheelchair or bedside chair)?: A Little Help needed to walk in hospital room?: A Little Help needed climbing 3-5 steps with a railing? : A Lot 6 Click Score: 16    End of Session Equipment Utilized During Treatment: Gait belt Activity Tolerance: Patient tolerated treatment well Patient left: in chair;with call bell/phone within reach;with chair alarm set Nurse Communication: Mobility status PT Visit Diagnosis: Other abnormalities of gait  and mobility (R26.89);Muscle weakness (generalized) (M62.81);History of falling (Z91.81)     Time: 2761-8485 PT Time Calculation (min) (ACUTE ONLY): 34 min  Charges:  $Gait Training: 8-22 mins                     Moishe Spice, PT, DPT Acute Rehabilitation Services  Pager: [phone] Office: [place] 04/21/2020, 11:03 AM

## 2020-04-21 NOTE — Evaluation (Signed)
Occupational Therapy Evaluation Patient Details Name: John Lambert MRN: [id number] DOB: [birth year] Today's Date: 04/21/2020    History of Present Illness John Lambert is a 79 y.o. 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 He also has pain in left upper leg/hip region, and R foot drag; Imaging notable for Numerous scattered punctate acute infarctions in the left  posterior frontal region consistent with micro embolic infarctions  which could either be in the anterior or middle cerebral artery  territory. Punctate acute infarction also affecting the splenium of  the corpus callosum.   Clinical Impression   PTA, pt was living at home with his wife, pt reports he was independent with ADL/IADL and modified independent with functional mobility at rollator level. Pt reports he does the grocery shopping, his wife does the driving and they split the cooking. Pt currently requires minA+2 for initial stand from EOB. He then progressed to require minguard+2 to stand from chair at the sink. He completed grooming while standing at sink level, demonstrating increased effort for right grip strength, RUE weaker, however still functional. Pt requires minguard+2 for functional mobility at RW level this date. Due to decline in current level of function, pt would benefit from acute OT to address established goals to facilitate safe D/C to venue listed below. At this time, recommend CIR follow-up. Will continue to follow acutely.     Follow Up Recommendations  CIR    Equipment Recommendations  3 in 1 bedside commode    Recommendations for Other Services       Precautions / Restrictions Precautions Precautions: Fall Restrictions Weight Bearing Restrictions: No      Mobility Bed Mobility Overal bed mobility: Needs Assistance Bed  Mobility: Supine to Sit Rolling: Min assist Sidelying to sit: Mod assist Supine to sit: Min guard;HOB elevated     General bed mobility comments: Pt reached L hand for hand of therapist to pull himself up to sit EOB with HOB elevated. Did not require assistance from therapist other than to be support and thus most likely could transition to sit EOB pulling on bed rail instead, min guard for safety.    Transfers Overall transfer level: Needs assistance Equipment used: Rolling walker (2 wheeled) Transfers: Sit to/from Stand Sit to Stand: +2 safety/equipment;Min assist         General transfer comment: MinAx2 to come to stand from EOB, with increased time to power up and knee flexion bias noted. Coming to stand from chair with UEs on arm rests with min guard assist for safety.    Balance Overall balance assessment: Needs assistance Sitting-balance support: Feet supported Sitting balance-Leahy Scale: Fair Sitting balance - Comments: Sitting EOB no overt LOB, supervision for safety.   Standing balance support: No upper extremity supported;Single extremity supported;Bilateral upper extremity supported Standing balance-Leahy Scale: Fair Standing balance comment: Pt able to reach minimally off BOS standing at sink working with OT, alternating 2-no UE support, but during bouts of no UE support he would tend to lean anteriorly placving his hips on edge of sink for support. Stands with R knee flexed and R leg bias.                           ADL either performed or assessed with clinical judgement   ADL Overall ADL's :  Needs assistance/impaired Eating/Feeding: Set up;Sitting   Grooming: Min guard;Standing;Oral care;Wash/dry hands;Wash/dry face Grooming Details (indicate cue type and reason): standing at sink level Upper Body Bathing: Set up;Sitting   Lower Body Bathing: Sit to/from stand;Minimal assistance   Upper Body Dressing : Set up;Sitting   Lower Body Dressing:  Minimal assistance;Sit to/from stand Lower Body Dressing Details (indicate cue type and reason): pt able to figure-4 to adjust socks Toilet Transfer: Min guard;+2 for physical assistance;+2 for safety/equipment;RW Toilet Transfer Details (indicate cue type and reason): simulated with ambulation in room and pt sat on standard chair Toileting- Clothing Manipulation and Hygiene: Min guard;+2 for physical assistance;+2 for safety/equipment;Sit to/from stand       Functional mobility during ADLs: Min guard;+2 for physical assistance;+2 for safety/equipment;Rolling walker General ADL Comments: pt with decreased activity tolerance, BLE weakness, decreased stride length putting him at increased risk for falls;Educated pt on fall prevention strategies during ADL and functional mobility     Vision Baseline Vision/History: Wears glasses Wears Glasses: At all times Patient Visual Report: No change from baseline Vision Assessment?: No apparent visual deficits     Perception     Praxis      Pertinent Vitals/Pain Pain Assessment: 0-10 Faces Pain Scale: Hurts a little bit Pain Location: L upper thigh with movement/weight bearing Pain Descriptors / Indicators: Discomfort;Grimacing Pain Intervention(s): Limited activity within patient's tolerance;Monitored during session     Hand Dominance Left   Extremity/Trunk Assessment Upper Extremity Assessment Upper Extremity Assessment: Generalized weakness;RUE deficits/detail RUE Deficits / Details: noted increased difficulty with opening containers, however WFL. Pt requiring increased effort RUE Sensation: WNL RUE Coordination: WNL   Lower Extremity Assessment Lower Extremity Assessment: Defer to PT evaluation RLE Deficits / Details: Notable decr strength, grossly 3/5 throughout; Dyscoordination noted with Heel to John Lambert RLE Coordination: decreased fine motor;decreased gross motor LLE Deficits / Details: Notably decr AROM against gravity due to L  upper thigh dicomfort with movement; Opted not to MMT due to this pain; Still, better coordination than RLE with stepping LLE: Unable to fully assess due to pain   Cervical / Trunk Assessment Cervical / Trunk Assessment: Kyphotic   Communication Communication Communication: No difficulties   Cognition Arousal/Alertness: Awake/alert Behavior During Therapy: WFL for tasks assessed/performed Overall Cognitive Status: Within Functional Limits for tasks assessed                                     General Comments  pt started bleeding when he brushed his teeth, he reports this happens occassioanlly at home but felt there was a bit more blood    Exercises     Shoulder Instructions      Home Living Family/patient expects to be discharged to:: Private residence Living Arrangements: Spouse/significant other Available Help at Discharge: Family;Available 24 hours/day (at or near 24 hours) Type of Home: House Home Access: Stairs to enter [employer] of Steps: 1 (through garage)   Home Layout: One level     Bathroom Shower/Tub: Teacher, early years/pre: Standard     Home Equipment: Environmental consultant - 4 wheels          Prior Functioning/Environment Level of Independence: Independent with assistive device(s)        Comments: Started using Rollator RW a few months ago after a fall        OT Problem List: Decreased strength;Decreased range of motion;Decreased activity tolerance;Impaired balance (sitting and/or  standing);Decreased safety awareness;Decreased knowledge of use of DME or AE      OT Treatment/Interventions: Self-care/ADL training;Therapeutic exercise;Energy conservation;DME and/or AE instruction;Therapeutic activities;Patient/family education;Balance training    OT Goals(Current goals can be found in the care plan section) Acute Rehab OT Goals Patient Stated Goal: Be safe walking; to not fall OT Goal Formulation: With patient Time For  Goal Achievement: 05/05/20 Potential to Achieve Goals: Good ADL Goals Pt Will Perform Grooming: with modified independence;standing Pt Will Transfer to Toilet: with modified independence;ambulating Additional ADL Goal #1: Pt will demonstrate independence with 3 fall prevention strategies during ADL/IADL and functional mobility.  OT Frequency: Min 2X/week   Barriers to D/C:            Co-evaluation PT/OT/SLP Co-Evaluation/Treatment: Yes Reason for Co-Treatment: Complexity of the patient's impairments (multi-system involvement);For patient/therapist safety;To address functional/ADL transfers PT goals addressed during session: Mobility/safety with mobility;Balance;Proper use of DME OT goals addressed during session: ADL's and self-care      AM-PAC OT "6 Clicks" Daily Activity     Outcome Measure Help from another person eating meals?: A Little Help from another person taking care of personal grooming?: A Little Help from another person toileting, which includes using toliet, bedpan, or urinal?: A Little Help from another person bathing (including washing, rinsing, drying)?: A Little Help from another person to put on and taking off regular upper body clothing?: A Little Help from another person to put on and taking off regular lower body clothing?: A Little 6 Click Score: 18   End of Session Equipment Utilized During Treatment: Gait belt;Rolling walker Nurse Communication: Mobility status  Activity Tolerance: Patient tolerated treatment well Patient left: in chair;with call bell/phone within reach;with chair alarm set;with family/visitor present  OT Visit Diagnosis: Unsteadiness on feet (R26.81);Other abnormalities of gait and mobility (R26.89);Muscle weakness (generalized) (M62.81);Pain                Time: 9678-9381 OT Time Calculation (min): 35 min Charges:  OT General Charges $OT Visit: 1 Visit OT Evaluation $OT Eval Moderate Complexity: [place] OTR/L Acute  Rehabilitation Services Office: [place] 04/21/2020, 12:20 PM

## 2020-04-21 NOTE — TOC Initial Note (Signed)
Transition of Care [facility]) - Initial/Assessment Note    Patient Details  Name: John Lambert MRN: [id number] Date of Birth: 1940/06/12  Transition of Care [facility]) CM/SW Contact:    Pollie Friar, RN Phone Number: 04/21/2020, 2:28 PM  Clinical Narrative:                 Recommendations are for IR. CM met with the patient and family after speaking to CIR CM. The family is interested in having his info faxed to [facility] to see if they will have a bed for him. CM has sent [facility] the needed information.  TOC following.  Expected Discharge Plan: IP Rehab Facility Barriers to Discharge: Continued Medical Work up   Patient Goals and CMS Choice   CMS Medicare.gov Compare Post Acute Care list provided to:: Patient Choice offered to / list presented to : [name]  Expected Discharge Plan and Services Expected Discharge Plan: [place]   Discharge Planning Services: CM Consult Post Acute Care Choice: IP Rehab Living arrangements for the past 2 months: Single Family Home                                      Prior Living Arrangements/Services Living arrangements for the past 2 months: Single Family Home Lives with:: Spouse Patient language and need for interpreter reviewed:: Yes Do you feel safe going back to the place where you live?: Yes      Need for Family Participation in Patient Care: Yes (Comment) Care giver support system in place?: Yes (comment) Current home services: DME (walker/ rollator) Criminal Activity/Legal Involvement Pertinent to Current Situation/Hospitalization: No - Comment as needed  Activities of Daily Living Home Assistive Devices/Equipment: Engineer, drilling (specify type) (rolator) ADL Screening (condition at time of admission) Patient's cognitive ability adequate to safely complete daily activities?: Yes Is the patient deaf or have difficulty hearing?: No Does the patient have difficulty seeing, even when wearing  glasses/contacts?: No Does the patient have difficulty concentrating, remembering, or making decisions?: No Patient able to express need for assistance with ADLs?: Yes Does the patient have difficulty dressing or bathing?: Yes Independently performs ADLs?: No Communication: Independent Dressing (OT): Needs assistance Is this a change from baseline?: Change from baseline, expected to last >3 days Grooming: Independent Feeding: Independent Bathing: Needs assistance Is this a change from baseline?: Change from baseline, expected to last >3 days Toileting: Needs assistance Is this a change from baseline?: Change from baseline, expected to last >3days In/Out Bed: Needs assistance Is this a change from baseline?: Change from baseline, expected to last >3 days Walks in Home: Needs assistance Is this a change from baseline?: Change from baseline, expected to last >3 days Does the patient have difficulty walking or climbing stairs?: Yes Weakness of Legs: Both Weakness of Arms/Hands: Both  Permission Sought/Granted                  Emotional Assessment Appearance:: Appears stated age Attitude/Demeanor/Rapport: Engaged Affect (typically observed): Accepting Orientation: : Oriented to Self,Oriented to  Time,Oriented to Place,Oriented to Situation   Psych Involvement: No (comment)  Admission diagnosis:  Weakness [R53.1] Pain [R52] Anticoagulated on warfarin [Z79.01] Fall at home, initial encounter [W19.Merril Abbe, T97.741] Patient Active Problem List   Diagnosis Date Noted  . Anticoagulated on warfarin 04/20/2020  . Impaired ambulation 04/20/2020  . Fall at home, initial encounter 04/20/2020  . Essential hypertension 04/20/2020  .  Blood clotting disorder ([place]) 04/20/2020  . Weakness 04/19/2020   PCP:  Harlan Stains, MD Pharmacy:   [id number] - [place], [address] [place] [address] Phone: [phone] Fax:  [phone]     Social Determinants of Health (SDOH) Interventions    Readmission Risk Interventions No flowsheet data found.

## 2020-04-21 NOTE — Progress Notes (Signed)
PROGRESS NOTE  John Lambert  DOB: 11-11-1940  PCP: Harlan Stains, MD WFU:[id number]  DOA: 04/19/2020  LOS: 0 days   Chief Complaint  Patient presents with  . Leg Pain  . Weakness   Brief narrative: John Lambert is a 79 y.o. 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 Patient presented to the ED on 12/14 with complaint of bilateral lower extremity weakness and difficulty walking associated with pain in the left hip region.  His symptoms started 24 hours prior to presentation while trying to get out of bed.  He managed to get out of bed and was able to ambulate without related to his living room.  He did not have a fall.  He continued to have pain for the whole day.  As at nighttime when he tried to get off his sofa, his legs gave out and he was completely unable to ambulate.  He has a history of neurofibroma tumor removal from his spine about 4 years ago and melanoma removal from right calf about 30 years ago. In the ED, x-rays and CT of left hip and pelvis were negative. INR 2.2. He was kept for overnight observation for further evaluation management.  MRI brain obtained this morning showed  1. Numerous scattered punctate acute infarctions in the left posterior frontal region consistent with micro embolic infarctions which could either be in the anterior or middle cerebral artery territory. Punctate acute infarction also affecting the splenium ofthe corpus callosum. 2. Extensive chronic small-vessel ischemic changes elsewherethroughout the brain. 3. Small old left parietal vertex cortical and subcorticalInfarctions.  MRI lumbar spine showed 1. 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 No recurrent spinal tumor is evident. 2. Superimposed lumbar spine degeneration primarily at L4-L5 and L5-S1. L4-L5 multifactorial mild to  moderate multifactorial spinal stenosis, mild right L4 neural foraminal stenosis. L5-S1 moderate right lateral recess and foraminal stenosis. Query Right L5 and/or S1 radiculitis.  Subjective: Patient was seen and examined this morning. Sitting up in chair.  Not in distress.  Continues to have weakness in the right leg.  CIR recommended by PT. Daughter and wife at bedside. Carotid duplex and CT angio of neck showed significant more than 80% stenosis of the left carotid artery.  Assessment/Plan: Acute CVA -Presented with weakness of lower extremities for 24 hours, impaired ambulation -MRI findings as above -A1c 5.3, LDL 84. -Seen by neurology. Seen by PT/OT.  CIR recommended. -Prior to admission, patient was already on Coumadin and Crestor.  INR therapeutic.  Remains on the same. Recent Labs  Lab 04/19/20 0230 04/20/20 0216 04/21/20 0423  GLUCOSE 110* 95 94   Hypertension -Home meds include Coreg and lisinopril. Continue Coreg. Keep lisinopril on hold. -Continue to monitor.  Blood clotting disorder, history of DVT/PE -On Coumadin.  Defer to neurology for further need of hypercoagulability work-up. -DVT lower extremity showed age-indeterminate, probably chronic DVT without symptoms. -Patient is already on Coumadin.  Mobility: PT eval obtained.  CIR recommended Code Status:   Code Status: Full Code  Nutritional status: Body mass index is 22.56 kg/m.     Diet Order            Diet Heart Room service appropriate? Yes; Fluid consistency: Thin  Diet effective now                 DVT prophylaxis:  warfarin (COUMADIN) tablet  2.5 mg   Antimicrobials:  None Fluid: None Consultants: Neurology Family Communication:  Family at bedside  Status is: Observation  The patient will require care spanning > 2 midnights and should be moved to inpatient because: pending CIR  Dispo: The patient is from: Home              Anticipated d/c is to: likely CIR              Anticipated  d/c date is: whenever bed is available.              Patient currently is medically stable to d/c.      Infusions:    Scheduled Meds: . carvedilol  25 mg Oral BID WC  . pravastatin  40 mg Oral Daily  . warfarin  2.5 mg Oral q1600  . Warfarin - Physician Dosing Inpatient   Does not apply q1600    Antimicrobials: Anti-infectives (From admission, onward)   None      PRN meds: acetaminophen   Objective: Vitals:   04/21/20 0850 04/21/20 1129  BP: (!) 149/87 108/62  Pulse: 75 75  Resp:  18  Temp:  98 F (36.7 C)  SpO2:  95%    Intake/Output Summary (Last 24 hours) at 04/21/2020 1340 Last data filed at 04/21/2020 0413 Gross per 24 hour  Intake 240 ml  Output 500 ml  Net -260 ml   Filed Weights   04/19/20 0043 04/20/20 0037 04/20/20 2034  Weight: 77.1 kg 75.8 kg 79.7 kg   Weight change: 3.949 kg Body mass index is 22.56 kg/m.   Physical Exam: General exam: Pleasant, elderly Caucasian male.  Not in distress Skin: No rashes, lesions or ulcers. HEENT: Atraumatic, normocephalic, no obvious bleeding Lungs: Clear to auscultation bilaterally CVS: regular rate and rhythm. GI/Abd soft, nontender, nondistended, bowel sound present CNS: Alert, awake, oriented x3.  Right lower extremity weakness remains. Psychiatry: Mood appropriate Extremities: No pedal edema, no calf tenderness.  Data Review: I have personally reviewed the laboratory data and studies available.  Recent Labs  Lab 04/19/20 0230 04/20/20 0216 04/21/20 0423  WBC 10.8* 9.9 8.0  NEUTROABS 8.4*  --  5.7  HGB 16.5 15.7 15.9  HCT 49.0 48.5 46.5  MCV 91.2 91.3 89.3  PLT 224 195 216   Recent Labs  Lab 04/19/20 0230 04/20/20 0216 04/21/20 0423  NA 139 141 140  K 4.0 4.6 4.3  CL 106 105 103  CO2 24 25 25   GLUCOSE 110* 95 94  BUN 19 15 15   CREATININE 0.86 0.94 0.88  CALCIUM 9.0 9.0 8.9  MG  --   --  1.9  PHOS  --   --  3.5    F/u labs ordered.  Signed, Terrilee Croak, MD [facility] 04/21/2020

## 2020-04-22 LAB — PROTIME-INR
INR: 2.1 — ABNORMAL HIGH (ref 0.8–1.2)
Prothrombin Time: 22.6 seconds — ABNORMAL HIGH (ref 11.4–15.2)

## 2020-04-22 NOTE — Progress Notes (Signed)
Physical Therapy Treatment Patient Details Name: John Lambert MRN: [id number] DOB: 09-25-1940 Today's Date: 04/22/2020    History of Present Illness John Lambert is a 79 y.o. 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 He also has pain in left upper leg/hip region, and R foot drag; Imaging notable for Numerous scattered punctate acute infarctions in the left  posterior frontal region consistent with micro embolic infarctions  which could either be in the anterior or middle cerebral artery  territory. Punctate acute infarction also affecting the splenium of  the corpus callosum.    PT Comments    Pt making progress towards his goals with improved L step length when cued this date. However, pt requires verbal and tactile cues to increase his R weight shift and stance time along with to increase his L step length, with fair carryover when cues removed. He demonstrates balance deficits resulting in him requiring minA for all standing and ambulation bouts while using a RW. He continues to demonstrate leg muscular weakness as he requires increased assistance of modA to come to stand from lower surfaces. Also, pt displays poor safety awareness with hand placement coming to stand and returning to sit, even with cues. Pt reports continual intermittent shooting pain in anterior L thigh that started when he fell. Performed femoral and sciatic neural tensions test on bilat legs with no recreation of his symptoms, but increased discomfort noted with L ankle plantarflexion during femoral tension test. Will continue to follow acutely. Current recommendations remain appropriate.  Follow Up Recommendations  CIR     Equipment Recommendations  Rolling walker with 5" wheels;3in1 (PT);Other (comment) (and to be determined, depending on progress)     Recommendations for Other Services Rehab consult     Precautions / Restrictions Precautions Precautions: Fall Restrictions Weight Bearing Restrictions: No    Mobility  Bed Mobility Overal bed mobility: Needs Assistance Bed Mobility: Supine to Sit;Rolling;Sit to Supine Rolling: Min guard   Supine to sit: Min guard Sit to supine: Min guard   General bed mobility comments: Pt able to perform all bed with extra time with bed flat, utilizing bed rails, min guard for safety.  Transfers Overall transfer level: Needs assistance Equipment used: Rolling walker (2 wheeled) Transfers: Sit to/from [facility] Sit to Stand: Mod assist Stand pivot transfers: Min assist       General transfer comment: ModA to come to stand from low surfaces but minA from chair with use of hands on arm rests. Cued pt for safe hand placement prior to all transfer, with fair carryover. MinA for balance with stand step transfer to L to recliner.  Ambulation/Gait Ambulation/Gait assistance: Min assist Gait Distance (Feet): 80 Feet Assistive device: Rolling walker (2 wheeled) Gait Pattern/deviations: Decreased step length - left;Decreased weight shift to right;Narrow base of support;Trunk flexed;Step-through pattern;Decreased stance time - right Gait velocity: decreased Gait velocity interpretation: <1.8 ft/sec, indicate of risk for recurrent falls General Gait Details: Ambulates with trunk flexed and decreased step length on L and decreased weight shift and stance time on R. Demonstrates poor feet clearance. Provided verbal and tactile cues at bilat pelvis and L hamstring to improve weight shift and leg advancement, with noted physical resistance at times with mod success and fair carryover noted. MinA for steadying and for safety.   Stairs  Wheelchair Mobility    Modified Rankin (Stroke Patients Only) Modified Rankin (Stroke Patients Only) Pre-Morbid Rankin Score: No  significant disability Modified Rankin: Moderately severe disability     Balance Overall balance assessment: Needs assistance Sitting-balance support: Feet supported Sitting balance-Leahy Scale: Fair Sitting balance - Comments: Sitting EOB no overt LOB, supervision for safety.   Standing balance support: No upper extremity supported;Single extremity supported;Bilateral upper extremity supported Standing balance-Leahy Scale: Poor Standing balance comment: 1-2 UE support during standing with minA for steadying. 1 LOB posteriorly and minA to recover.                            Cognition Arousal/Alertness: Awake/alert Behavior During Therapy: WFL for tasks assessed/performed Overall Cognitive Status: Within Functional Limits for tasks assessed                                        Exercises      General Comments General comments (skin integrity, edema, etc.): Performed fmeoral and sciatic neural tension test on bilat legs, with no recreation of symptoms but increased discomfort noted with L femoral tension test during passive ankle plantarflexion      Pertinent Vitals/Pain Pain Assessment: Faces Faces Pain Scale: Hurts little more Pain Location: L upper thigh Pain Descriptors / Indicators: Discomfort;Grimacing;Shooting Pain Intervention(s): Limited activity within patient's tolerance;Monitored during session;Repositioned    Home Living     Available Help at Discharge: Family;Available 24 hours/day Type of Home: House              Prior Function            PT Goals (current goals can now be found in the care plan section) Acute Rehab PT Goals Patient Stated Goal: Be safe walking; to not fall PT Goal Formulation: With patient Time For Goal Achievement: 05/04/20 Potential to Achieve Goals: Good Progress towards PT goals: Progressing toward goals    Frequency    Min 4X/week      PT Plan Current plan remains appropriate     Co-evaluation              AM-PAC PT "6 Clicks" Mobility   Outcome Measure  Help needed turning from your back to your side while in a flat bed without using bedrails?: A Little Help needed moving from lying on your back to sitting on the side of a flat bed without using bedrails?: A Little Help needed moving to and from a bed to a chair (including a wheelchair)?: A Little Help needed standing up from a chair using your arms (e.g., wheelchair or bedside chair)?: A Little Help needed to walk in hospital room?: A Little Help needed climbing 3-5 steps with a railing? : A Lot 6 Click Score: 17    End of Session Equipment Utilized During Treatment: Gait belt Activity Tolerance: Patient tolerated treatment well Patient left: in chair;with call bell/phone within reach;with chair alarm set;with family/visitor present Nurse Communication: Mobility status PT Visit Diagnosis: Other abnormalities of gait and mobility (R26.89);Muscle weakness (generalized) (M62.81);History of falling (Z91.81)     Time: 9622-2979 PT Time Calculation (min) (ACUTE ONLY): 29 min  Charges:  $Gait Training: 8-22 mins $Therapeutic Activity: 8-22 mins                     Moishe Spice, PT, DPT Acute Rehabilitation Services  Pager: [phone] Office: [place] 04/22/2020, 3:10 PM

## 2020-04-22 NOTE — Plan of Care (Signed)

## 2020-04-22 NOTE — Evaluation (Signed)
Clinical/Bedside Swallow Evaluation Patient Details  Name: ROBERTA KELLY MRN: [id number] Date of Birth: [birth year]  Today's Date: 04/22/2020 Time: SLP Start Time (ACUTE ONLY): 2229 SLP Stop Time (ACUTE ONLY): 1407 SLP Time Calculation (min) (ACUTE ONLY): 12 min  Past Medical History:  Past Medical History:  Diagnosis Date  . Blood clotting disorder ([place])   . Blood dyscrasia   . Cancer ([place])    melanoma removed from right calf  . Hypertension   . Neuromuscular disorder ([place])    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   Past Surgical History:  Past Surgical History:  Procedure Laterality Date  . SKIN BIOPSY     HPI:  Pt is an 79 y.o. 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 He also has pain in left upper leg/hip region, and R foot drag; Imaging notable for Numerous scattered punctate acute infarctions in the left  posterior frontal region consistent with micro embolic infarctions  which could either be in the anterior or middle cerebral artery  territory. Punctate acute infarction also affecting the splenium of  the corpus callosum.   Assessment / Plan / Recommendation Clinical Impression  Pt was seen for bedside swallow evaluation. He reported that meats occasionally "get stuck" if he eats too quickly or does not cut them in small enough pieces. However, he denied any other chronic or acute symptoms of oropharyngeal dysphagia. Oral mechanism exam was [facility] and dentition was adequate. he tolerated all solids and liquids without signs or symptoms of oropharyngeal dysphagia. A regular texture diet with thin liquids is recommended at this time and further skilled SLP services are not clinically indicated for swallowing. SLP Visit Diagnosis: Dysphagia, unspecified (R13.10)    Aspiration Risk  No limitations    Diet  Recommendation Regular;Thin liquid   Liquid Administration via: Cup;Straw Medication Administration: Whole meds with liquid Supervision: Patient able to self feed Postural Changes: Seated upright at 90 degrees    Other  Recommendations Oral Care Recommendations: Oral care BID   Follow up Recommendations None      Frequency and Duration            Prognosis        Swallow Study   General Date of Onset: 04/21/20 HPI: Pt is an 79 y.o. 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 He also has pain in left upper leg/hip region, and R foot drag; Imaging notable for Numerous scattered punctate acute infarctions in the left  posterior frontal region consistent with micro embolic infarctions  which could either be in the anterior or middle cerebral artery  territory. Punctate acute infarction also affecting the splenium of  the corpus callosum. Type of Study: Bedside Swallow Evaluation Previous Swallow Assessment: None Diet Prior to this Study: Regular;Thin liquids Temperature Spikes Noted: No Respiratory Status: Room air History of Recent Intubation: No Behavior/Cognition: Alert;Cooperative;Pleasant mood Oral Cavity Assessment: Within Functional Limits Oral Care Completed by SLP: Recent completion by staff Oral Cavity - Dentition: Adequate natural dentition Vision: Functional for self-feeding Self-Feeding Abilities: Able to feed self Patient Positioning: Postural control adequate for testing;Upright in chair Baseline Vocal Quality: Normal Volitional Cough: Strong Volitional Swallow: Able to elicit    Oral/Motor/Sensory Function Overall Oral Motor/Sensory  Function: Within functional limits   Ice Chips Ice chips: Within functional limits Presentation: Spoon   Thin Liquid Thin Liquid: Within functional  limits Presentation: Straw    Nectar Thick Nectar Thick Liquid: Not tested   Honey Thick Honey Thick Liquid: Not tested   Puree Puree: Within functional limits Presentation: Spoon   Solid     Solid: Within functional limits Presentation: [place] I. Hardin Negus, [place], [place] Office number [phone] Pager [place] 04/22/2020,2:34 PM

## 2020-04-22 NOTE — Evaluation (Signed)
Speech Language Pathology Evaluation Patient Details Name: John Lambert MRN: [id number] DOB: [birth year] Today's Date: 04/22/2020 Time: 6222-9798 SLP Time Calculation (min) (ACUTE ONLY): 17 min  Problem List:  Patient Active Problem List   Diagnosis Date Noted  . Cerebral embolism with cerebral infarction 04/21/2020  . Stroke ([place]) 04/21/2020  . Anticoagulated on warfarin 04/20/2020  . Impaired ambulation 04/20/2020  . Fall at home, initial encounter 04/20/2020  . Essential hypertension 04/20/2020  . Blood clotting disorder ([place]) 04/20/2020  . Weakness 04/19/2020   Past Medical History:  Past Medical History:  Diagnosis Date  . Blood clotting disorder ([place])   . Blood dyscrasia   . Cancer ([place])    melanoma removed from right calf  . Hypertension   . Neuromuscular disorder ([place])    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   Past Surgical History:  Past Surgical History:  Procedure Laterality Date  . SKIN BIOPSY     HPI:  Pt is an 79 y.o. 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 He also has pain in left upper leg/hip region, and R foot drag; Imaging notable for Numerous scattered punctate acute infarctions in the left  posterior frontal region consistent with micro embolic infarctions  which could either be in the anterior or middle cerebral artery  territory. Punctate acute infarction also affecting the splenium of  the corpus callosum.   Assessment / Plan / Recommendation Clinical Impression  Pt participated in speech/language/cognition evaluation with his wife and daughter present. Pt denied any significant baseline deficits in speech, language, or cognition but remarked that he "forgets some things". All parties denied observation of acute changes in any of these areas. Pt has some college-level education and is  currently retired. [facility] Mental Status Examination was completed to evaluate the pt's cognitive-linguistic skills. He achieved a score of 27/30 which is within the normal limits of 27 or more out of 30. No speech or language deficits were noted and his performance on informal cognitive-linguistic tasks was within normal limits. Further skilled SLP services are not clinically indicated at this time. Pt and his family were educated regarding this and all parties verbalized understanding as well as agreement with plan of care.    SLP Assessment  SLP Recommendation/Assessment: Patient does not need any further Speech Lanaguage Pathology Services SLP Visit Diagnosis: Cognitive communication deficit (R41.841)    Follow Up Recommendations  None    Frequency and Duration           SLP Evaluation Cognition  Overall Cognitive Status: Within Functional Limits for tasks assessed Arousal/Alertness: Awake/alert Orientation Level: Oriented X4 Attention: Focused;Sustained Focused Attention: Appears intact Sustained Attention: Appears intact Memory: Appears intact (Immediate: 5/5; delayed: 4/5; with cue: 1/1) Awareness: Appears intact Problem Solving: Appears intact Executive Function: Reasoning;Sequencing;Organizing Reasoning: Appears intact Sequencing:  (Clock drawing: 2/4) Organizing: Appears intact (backward digit span: 2/2)       Comprehension  Auditory Comprehension Overall Auditory Comprehension: Appears within functional limits for tasks assessed Yes/No Questions: Within Functional Limits Commands: Within Functional Limits Conversation: Complex Visual Recognition/Discrimination Discrimination: Within Function Limits    Expression Expression Primary Mode of Expression: Verbal Verbal Expression Overall Verbal Expression: Appears within functional limits for tasks assessed Initiation: No impairment Level of Generative/Spontaneous Verbalization:  Conversation Repetition: No impairment Naming: No impairment Pragmatics: No impairment Written Expression Dominant Hand: Right  Written Expression: Within Functional Limits   Oral / Motor  Oral Motor/Sensory Function Overall Oral Motor/Sensory Function: Within functional limits Motor Speech Overall Motor Speech: Appears within functional limits for tasks assessed Respiration: Within functional limits Phonation: Normal Resonance: Within functional limits Articulation: Within functional limitis Intelligibility: Intelligible Motor Planning: Witnin functional limits   Xiadani Damman I. Hardin Negus, [place], [place] Office number [phone] Pager [place] 04/22/2020, 2:42 PM

## 2020-04-22 NOTE — Progress Notes (Signed)
No neurological changes overnight.  Pt still weary about surgery and states that his family is definitely on board.  States that it is on the schedule for next Wednesday.  His family is on board and he guesses he is too.  Difficult to determine if he is 100% on board with having surgery.    If he does decide definitively on surgery, then he would need to be on Plavix and aspirin and be bridged with Lovenox prior to procedure.   Leontine Locket, [facility] 04/22/2020 8:01 AM

## 2020-04-22 NOTE — Progress Notes (Signed)
PROGRESS NOTE  John Lambert  DOB: 1940-05-11  PCP: Harlan Stains, MD XVQ:[id number]  DOA: 04/19/2020  LOS: 1 day   Chief Complaint  Patient presents with  . Leg Pain  . Weakness   Brief narrative: John Lambert is a 79 y.o. 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 Patient presented to the ED on 12/14 with complaint of bilateral lower extremity weakness and difficulty walking associated with pain in the left hip region.  His symptoms started 24 hours prior to presentation while trying to get out of bed.  He managed to get out of bed and was able to ambulate without related to his living room.  He did not have a fall.  He continued to have pain for the whole day.  As at nighttime when he tried to get off his sofa, his legs gave out and he was completely unable to ambulate.  He has a history of neurofibroma tumor removal from his spine about 4 years ago and melanoma removal from right calf about 30 years ago. In the ED, x-rays and CT of left hip and pelvis were negative. INR 2.2. He was kept for overnight observation for further evaluation management.  MRI brain obtained this morning showed  1. Numerous scattered punctate acute infarctions in the left posterior frontal region consistent with micro embolic infarctions which could either be in the anterior or middle cerebral artery territory. Punctate acute infarction also affecting the splenium ofthe corpus callosum. 2. Extensive chronic small-vessel ischemic changes elsewherethroughout the brain. 3. Small old left parietal vertex cortical and subcorticalInfarctions.  MRI lumbar spine showed 1. 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 No recurrent spinal tumor is evident. 2. Superimposed lumbar spine degeneration primarily at L4-L5 and L5-S1. L4-L5 multifactorial mild to  moderate multifactorial spinal stenosis, mild right L4 neural foraminal stenosis. L5-S1 moderate right lateral recess and foraminal stenosis. Query Right L5 and/or S1 radiculitis.  Subjective: Patient was seen and examined this afternoon. Pleasant, elderly Caucasian male.  Propped up in bed.  Not in distress. Wife and daughter at bedside. Noted a plan from vascular surgery to do carotid artery stenting next week.  Assessment/Plan: Acute CVA -Presented with weakness of lower extremities for 24 hours, impaired ambulation -MRI findings as above -A1c 5.3, LDL 84. -Seen by neurology. Seen by PT/OT. CIR recommended. -Prior to admission, patient was already on Coumadin and Crestor.  INR therapeutic.  Remains on the same.  Left carotid artery stenosis -Carotid duplex and CT angio of neck showed significant stenosis of the left carotid artery. -Vascular surgery consult appreciated.  Patient is planned for carotid artery stenting next week tentatively on Wednesday. -Currently on Coumadin.  Will discuss with vascular surgery about the plan of anticoagulation perioperatively.  Hypertension -Home meds include Coreg and lisinopril. Continue Coreg.  Continue to hold lisinopril. -Continue to monitor.  Blood clotting disorder, history of DVT/PE -On Coumadin.  -Ultrasound duplex of lower extremities showed age-indeterminate, probably chronic DVT without symptoms. -Patient is already on Coumadin.  Mobility: PT eval obtained.  CIR recommended Code Status:   Code Status: Full Code  Nutritional status: Body mass index is 22.56 kg/m.     Diet Order            Diet Heart Room service appropriate? Yes; Fluid consistency: Thin  Diet effective now  DVT prophylaxis:  warfarin (COUMADIN) tablet 2.5 mg   Antimicrobials:  None Fluid: None Consultants: Neurology Family Communication:  Family at bedside  Status is: Inpatient  Remains inpatient appropriate because: Pending carotid  artery stenting Dispo: The patient is from: Home              Anticipated d/c is to: CIR recommended              Anticipated d/c date is: > 3 days              Patient currently is not medically stable to d/c.           Infusions:    Scheduled Meds: . carvedilol  25 mg Oral BID WC  . pravastatin  40 mg Oral Daily  . warfarin  2.5 mg Oral q1600  . Warfarin - Physician Dosing Inpatient   Does not apply q1600    Antimicrobials: Anti-infectives (From admission, onward)   None      PRN meds: acetaminophen   Objective: Vitals:   04/22/20 0733 04/22/20 1125  BP: 128/69 128/82  Pulse: 75 62  Resp: 20 18  Temp: (!) 97.3 F (36.3 C) 97.8 F (36.6 C)  SpO2: 91% 95%    Intake/Output Summary (Last 24 hours) at 04/22/2020 1421 Last data filed at 04/22/2020 0836 Gross per 24 hour  Intake --  Output 525 ml  Net -525 ml   Filed Weights   04/19/20 0043 04/20/20 0037 04/20/20 2034  Weight: 77.1 kg 75.8 kg 79.7 kg   Weight change:  Body mass index is 22.56 kg/m.   Physical Exam: General exam: Pleasant, elderly Caucasian male.  Not in distress. No new symptoms Skin: No rashes, lesions or ulcers. HEENT: Atraumatic, normocephalic, no obvious bleeding Lungs: Clear to auscultation bilaterally. CVS: Regular rate and rhythm, no murmur GI/Abd soft, nontender, nondistended, bowel sound present CNS: Alert, awake, oriented x3.  Right lower extremity weakness remains. Psychiatry: Mood appropriate Extremities: No pedal edema, no calf tenderness.  Data Review: I have personally reviewed the laboratory data and studies available.  Recent Labs  Lab 04/19/20 0230 04/20/20 0216 04/21/20 0423  WBC 10.8* 9.9 8.0  NEUTROABS 8.4*  --  5.7  HGB 16.5 15.7 15.9  HCT 49.0 48.5 46.5  MCV 91.2 91.3 89.3  PLT 224 195 216   Recent Labs  Lab 04/19/20 0230 04/20/20 0216 04/21/20 0423  NA 139 141 140  K 4.0 4.6 4.3  CL 106 105 103  CO2 24 25 25   GLUCOSE 110* 95 94  BUN 19  15 15   CREATININE 0.86 0.94 0.88  CALCIUM 9.0 9.0 8.9  MG  --   --  1.9  PHOS  --   --  3.5    F/u labs ordered.  Signed, Terrilee Croak, MD [facility] 04/22/2020

## 2020-04-22 NOTE — Progress Notes (Signed)
Inpatient Rehabilitation-Admissions Coordinator   Met with pt and his family bedside as follow up from PM&R consult. Please see formal consult by Dr. Dagoberto Ligas on 12/16. Per recommendations, pt is appropriate for CIR but for a short length of stay. As pt is scheduled for surgery Wednesday, we would need to wait on opening his insurance case until he is medically ready post surgery. Discussed that pt is expected to progress quickly based on PM&R consult note and anticipate pt may not need an intensive rehab program once his medical workup has been complete (after next Wednesday). Pt and his family verbalized understanding of current plan. We will follow along and continue to assess his functional need for post acute rehab.   Raechel Ache, OTR/L  Rehab Admissions Coordinator  [phone] 04/22/2020 10:59 AM

## 2020-04-23 LAB — PROTIME-INR
INR: 2 — ABNORMAL HIGH (ref 0.8–1.2)
Prothrombin Time: 21.7 seconds — ABNORMAL HIGH (ref 11.4–15.2)

## 2020-04-23 MED ORDER — CLOPIDOGREL BISULFATE 75 MG PO TABS
75.0000 mg | ORAL_TABLET | Freq: Every day | ORAL | Status: DC
  Administered 2020-04-23 – 2020-05-03 (×10): 75 mg via ORAL
  Filled 2020-04-23 (×10): qty 1

## 2020-04-23 MED ORDER — CYCLOBENZAPRINE HCL 10 MG PO TABS
5.0000 mg | ORAL_TABLET | Freq: Every evening | ORAL | Status: DC | PRN
  Administered 2020-04-23 – 2020-05-03 (×3): 5 mg via ORAL
  Filled 2020-04-23 (×3): qty 1

## 2020-04-23 MED ORDER — ASPIRIN EC 81 MG PO TBEC
81.0000 mg | DELAYED_RELEASE_TABLET | Freq: Every day | ORAL | Status: DC
  Administered 2020-04-23 – 2020-04-26 (×4): 81 mg via ORAL
  Filled 2020-04-23 (×4): qty 1

## 2020-04-23 MED ORDER — ENOXAPARIN SODIUM 80 MG/0.8ML ~~LOC~~ SOLN
1.0000 mg/kg | Freq: Two times a day (BID) | SUBCUTANEOUS | Status: DC
  Administered 2020-04-23 – 2020-04-28 (×10): 80 mg via SUBCUTANEOUS
  Filled 2020-04-23 (×12): qty 0.8

## 2020-04-23 NOTE — Progress Notes (Signed)
Patient is scheduled for left [facility] on Wednesday with Dr. Trula Slade.  I spoke to the patient this morning and he states he has decided to proceed with surgery and is in agreement with his family.  He will need to be started on dual antiplatelet therapy with aspirin Plavix.  Need to hold his Coumadin which is still ordered.  I discussed with the hospitalist this morning and will plan bridge therapy once the Coumadin is held.  Marty Heck, MD [facility] Office: [place]

## 2020-04-23 NOTE — Progress Notes (Signed)
Physical Therapy Treatment Patient Details Name: John Lambert MRN: [id number] DOB: [birth year] Today's Date: 04/23/2020    History of Present Illness John Lambert is a 79 y.o. 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 He also has pain in left upper leg/hip region, and R foot drag; Imaging notable for Numerous scattered punctate acute infarctions in the left  posterior frontal region consistent with micro embolic infarctions  which could either be in the anterior or middle cerebral artery  territory. Punctate acute infarction also affecting the splenium of  the corpus callosum.    PT Comments    Pt is progressing well towards goals and very motivated to participate. He performed sit<>stand 3 ways with difficulty increasing with each variation as stronger LE was placed at a disadvantage, forcing more weight on the RLE. Pt ambulated in hallway with R LE in flexion, however not buckling. Multimodal cues and manual facilitation provided at R quad to increase knee extension and encourage increased step length on the L. Pt struggling to achieve full knee extension in standing, however does not seem limited in PROM once seated. Pt continues to be appropriate for CIR placement once d/c. Will continue to follow acutely.      Follow Up Recommendations  CIR     Equipment Recommendations  Rolling walker with 5" wheels;3in1 (PT);Other (comment) (and to be determined, depending on progress)    Recommendations for Other Services       Precautions / Restrictions Precautions Precautions: Fall    Mobility  Bed Mobility Overal bed mobility: Needs Assistance Bed Mobility: Supine to Sit     Supine to sit: Min guard     General bed mobility comments: increased time and effort to come to EOB. HOB elevated and use of bed  rails.  Transfers Overall transfer level: Needs assistance Equipment used: Rolling walker (2 wheeled) Transfers: Sit to/from Stand Sit to Stand: Min assist         General transfer comment: min A to rise from EOB, toilet, and chair with arm rests. Cues for hand placement, feet placement, and forward weight shits.  Ambulation/Gait Ambulation/Gait assistance: Min assist Gait Distance (Feet): 80 Feet Assistive device: Rolling walker (2 wheeled) Gait Pattern/deviations: Decreased step length - left;Decreased weight shift to right;Narrow base of support;Trunk flexed;Step-through pattern;Decreased stance time - right Gait velocity: decreased   General Gait Details: Ambulates with trunk flexed and decreased step length on L and decreased weight shift and stance time on R. R LE in flexion during gait. Manual facilitation and multimodal cues to straighten RLE during stance phase. Pt required cues to slow down and focus. Inconsistanly able to fully extend RLE during gait.   Stairs             Wheelchair Mobility    Modified Rankin (Stroke Patients Only) Modified Rankin (Stroke Patients Only) Pre-Morbid Rankin Score: No significant disability Modified Rankin: Moderately severe disability     Balance Overall balance assessment: Needs assistance Sitting-balance support: Feet supported Sitting balance-Leahy Scale: Fair Sitting balance - Comments: Sitting EOB no overt LOB, supervision for safety.   Standing balance support: No upper extremity supported;Single extremity supported;Bilateral upper extremity supported Standing balance-Leahy Scale: Poor Standing balance comment: 1-2 UE support during standing with minA for steadying. 1 LOB posteriorly and minA to recover.  Cognition Arousal/Alertness: Awake/alert Behavior During Therapy: WFL for tasks assessed/performed Overall Cognitive Status: Within Functional Limits for tasks assessed                                         Exercises Other Exercises Other Exercises: sit<>stand 3 ways, 4x normal, 3x L foot to the side, 3x L foot forward. Difficulty increased with each variation as stronger LE was placed at a disadvantage, forcing more weight on the RLE.    General Comments General comments (skin integrity, edema, etc.): wife present for session      Pertinent Vitals/Pain Pain Assessment: Faces Faces Pain Scale: Hurts a little bit Pain Location: L upper thigh Pain Descriptors / Indicators: Discomfort;Grimacing;Shooting Pain Intervention(s): Monitored during session;Limited activity within patient's tolerance;Repositioned    Home Living                      Prior Function            PT Goals (current goals can now be found in the care plan section) Acute Rehab PT Goals Patient Stated Goal: Be safe walking; to not fall PT Goal Formulation: With patient Time For Goal Achievement: 05/04/20 Potential to Achieve Goals: Good Progress towards PT goals: Progressing toward goals    Frequency    Min 4X/week      PT Plan Current plan remains appropriate    Co-evaluation              AM-PAC PT "6 Clicks" Mobility   Outcome Measure  Help needed turning from your back to your side while in a flat bed without using bedrails?: A Little Help needed moving from lying on your back to sitting on the side of a flat bed without using bedrails?: A Little Help needed moving to and from a bed to a chair (including a wheelchair)?: A Little Help needed standing up from a chair using your arms (e.g., wheelchair or bedside chair)?: A Little Help needed to walk in hospital room?: A Little Help needed climbing 3-5 steps with a railing? : A Lot 6 Click Score: 17    End of Session Equipment Utilized During Treatment: Gait belt Activity Tolerance: Patient tolerated treatment well Patient left: in chair;with call bell/phone within reach;with chair alarm  set;with family/visitor present Nurse Communication: Mobility status PT Visit Diagnosis: Other abnormalities of gait and mobility (R26.89);Muscle weakness (generalized) (M62.81);History of falling (Z91.81)     Time: 2706-2376 PT Time Calculation (min) (ACUTE ONLY): 48 min  Charges:  $Gait Training: 8-22 mins $Neuromuscular Re-education: 23-37 mins                     Benjiman Core, [place] Pager [phone] Acute Rehab   Allena Katz 04/23/2020, 1:24 PM

## 2020-04-23 NOTE — Progress Notes (Signed)
ANTICOAGULATION CONSULT NOTE - Initial Consult  Pharmacy Consult for enoxaparin Indication: on PTA warfarin for hx of DVT/PE - now being held for TCAR planned for Wednesday, December 22nd. Plan to bridge with Lovenox.   Allergies  Allergen Reactions  . Hctz [Hydrochlorothiazide] Other (See Comments)    dizzy  . Hydrocodone Itching  . Vicodin [Hydrocodone-Acetaminophen] Itching    Patient Measurements: Height: 6\' 2"  (188 cm) Weight: 79.7 kg (175 lb 11.3 oz) IBW/kg (Calculated) : 82.2  Vital Signs: Temp: 98.2 F (36.8 C) (12/18 0756) Temp Source: Oral (12/18 0756) BP: 118/68 (12/18 0756) Pulse Rate: 66 (12/18 0756)  Labs: Recent Labs    04/21/20 0423 04/22/20 0432 04/23/20 0316  HGB 15.9  --   --   HCT 46.5  --   --   PLT 216  --   --   LABPROT 23.1* 22.6* 21.7*  INR 2.1* 2.1* 2.0*  CREATININE 0.88  --   --     Estimated Creatinine Clearance: 74.2 mL/min (by C-G formula based on SCr of 0.88 mg/dL).   Medical History: Past Medical History:  Diagnosis Date  . Blood clotting disorder ([place])   . Blood dyscrasia   . Cancer ([place])    melanoma removed from right calf  . Hypertension   . Neuromuscular disorder ([place])    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    Medications:  Scheduled:  . aspirin EC  81 mg Oral Daily  . carvedilol  25 mg Oral BID WC  . clopidogrel  75 mg Oral Daily  . pravastatin  40 mg Oral Daily    Assessment: 79 yo male with L symptomatic carotid stenosis.  Patient and family in agreement to proceed with left sided TCAR scheduled for Wednesday, December 22nd.  Patient started on aspirin and clopidogrel per primary team at request of [facility].  Warfarin was being managed by physician and has been discontinued for Lovenox bridge.  INR today is 2.0.    PTA warfarin dose: 2.5mg  daily except 1.25mg  on Mondays  Goal of Therapy:  Will not plan to monitor anti-Xa levels at this time Monitor platelets by anticoagulation protocol: Yes   Plan:  Lovenox 1mg /kg  subcutaneously q12hours --> 80 mg/kg q12hours Monitor daily CBC and INR F/u oral AC plans post surgery  Dimple Nanas, PharmD PGY-1 Acute Care Pharmacy Resident Office: [phone] 04/23/2020 10:37 AM

## 2020-04-23 NOTE — Plan of Care (Signed)

## 2020-04-23 NOTE — Progress Notes (Signed)
PROGRESS NOTE  John Lambert  DOB: 1941/04/23  PCP: Harlan Stains, MD KWI:[id number]  DOA: 04/19/2020  LOS: 2 days   Chief Complaint  Patient presents with  . Leg Pain  . Weakness   Brief narrative: COVEY BALLER is a 79 y.o. 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 Patient presented to the ED on 12/14 with complaint of bilateral lower extremity weakness and difficulty walking associated with pain in the left hip region.  His symptoms started 24 hours prior to presentation while trying to get out of bed.  He managed to get out of bed and was able to ambulate without related to his living room.  He did not have a fall.  He continued to have pain for the whole day.  As at nighttime when he tried to get off his sofa, his legs gave out and he was completely unable to ambulate.  He has a history of neurofibroma tumor removal from his spine about 4 years ago and melanoma removal from right calf about 30 years ago. In the ED, x-rays and CT of left hip and pelvis were negative. INR 2.2. He was kept for overnight observation for further evaluation management.  MRI brain obtained this morning showed  1. Numerous scattered punctate acute infarctions in the left posterior frontal region consistent with micro embolic infarctions which could either be in the anterior or middle cerebral artery territory. Punctate acute infarction also affecting the splenium ofthe corpus callosum. 2. Extensive chronic small-vessel ischemic changes elsewherethroughout the brain. 3. Small old left parietal vertex cortical and subcorticalInfarctions.  MRI lumbar spine showed 1. 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 No recurrent spinal tumor is evident. 2. Superimposed lumbar spine degeneration primarily at L4-L5 and L5-S1. L4-L5 multifactorial mild to  moderate multifactorial spinal stenosis, mild right L4 neural foraminal stenosis. L5-S1 moderate right lateral recess and foraminal stenosis. Query Right L5 and/or S1 radiculitis.  Subjective: Patient was seen and examined this morning.  Propped up in bed.  Not in distress.  He has intermittent shooting pain in his anterior left thigh.  Not on any pain medicine. Wife at bedside.  Daughter on the phone.  Assessment/Plan: Acute CVA -Presented with weakness of lower extremities for 24 hours, impaired ambulation -MRI findings as above -A1c 5.3, LDL 84. -Seen by neurology. Seen by PT/OT. CIR recommended. -Prior to admission, patient was on Coumadin and Crestor. -See below for the plan of anticoagulation.  Left carotid artery stenosis -Carotid duplex and CT angio of neck showed significant stenosis of the left carotid artery. -Vascular surgery consult appreciated.  Patient is planned for carotid artery stenting next week tentatively on Wednesday. -Prior to admission patient was on Coumadin.  As per recommendation by vascular surgery, Coumadin has been stopped.  He has been started on Lovenox full dose as well as dual antiplatelet coverage with aspirin and Plavix.    Hypertension -Home meds include Coreg and lisinopril. Continue Coreg.  Continue to hold lisinopril. -Continue to monitor.  Blood clotting disorder, history of DVT/PE -On Coumadin.  -Ultrasound duplex of lower extremities showed age-indeterminate, probably chronic DVT without symptoms. -Patient is already on Coumadin.  Left thigh pain -Shooting pain, intermittent.  We will start the patient on Flexeril nightly as needed.  Mobility: PT eval obtained.  CIR recommended Code Status:   Code Status: Full Code  Nutritional status:  Body mass index is 22.56 kg/m.     Diet Order            Diet Heart Room service appropriate? Yes; Fluid consistency: Thin  Diet effective now                 DVT prophylaxis:     Antimicrobials:  None Fluid: None Consultants: Neurology Family Communication:  Family at bedside  Status is: Inpatient  Remains inpatient appropriate because: Pending carotid artery stenting Dispo: The patient is from: Home              Anticipated d/c is to: CIR recommended              Anticipated d/c date is: > 3 days              Patient currently is not medically stable to d/c.           Infusions:    Scheduled Meds: . aspirin EC  81 mg Oral Daily  . carvedilol  25 mg Oral BID WC  . clopidogrel  75 mg Oral Daily  . enoxaparin (LOVENOX) injection  1 mg/kg (Adjusted) Subcutaneous Q12H  . pravastatin  40 mg Oral Daily    Antimicrobials: Anti-infectives (From admission, onward)   None      PRN meds: acetaminophen, cyclobenzaprine   Objective: Vitals:   04/23/20 0756 04/23/20 1112  BP: 118/68 140/75  Pulse: 66 66  Resp: (!) 24 18  Temp: 98.2 F (36.8 C) 97.9 F (36.6 C)  SpO2: 94% 96%    Intake/Output Summary (Last 24 hours) at 04/23/2020 1427 Last data filed at 04/23/2020 1000 Gross per 24 hour  Intake --  Output 850 ml  Net -850 ml   Filed Weights   04/19/20 0043 04/20/20 0037 04/20/20 2034  Weight: 77.1 kg 75.8 kg 79.7 kg   Weight change:  Body mass index is 22.56 kg/m.   Physical Exam: General exam: Pleasant, elderly Caucasian male.  Not in distress.  Intermittent left high pain. Skin: No rashes, lesions or ulcers. HEENT: Atraumatic, normocephalic, no obvious bleeding Lungs: Clear to auscultation bilaterally. CVS: Regular rate and rhythm, no murmur GI/Abd soft, nontender, nondistended, bowel sound present CNS: Alert, awake, oriented x3.  Right lower extremity weakness remains. Psychiatry: Mood appropriate Extremities: No pedal edema, no calf tenderness.  Data Review: I have personally reviewed the laboratory data and studies available.  Recent Labs  Lab 04/19/20 0230 04/20/20 0216 04/21/20 0423  WBC 10.8* 9.9 8.0   NEUTROABS 8.4*  --  5.7  HGB 16.5 15.7 15.9  HCT 49.0 48.5 46.5  MCV 91.2 91.3 89.3  PLT 224 195 216   Recent Labs  Lab 04/19/20 0230 04/20/20 0216 04/21/20 0423  NA 139 141 140  K 4.0 4.6 4.3  CL 106 105 103  CO2 24 25 25   GLUCOSE 110* 95 94  BUN 19 15 15   CREATININE 0.86 0.94 0.88  CALCIUM 9.0 9.0 8.9  MG  --   --  1.9  PHOS  --   --  3.5    F/u labs ordered.  Signed, Terrilee Croak, MD [facility] 04/23/2020

## 2020-04-24 LAB — CBC
HCT: 46 % (ref 39.0–52.0)
Hemoglobin: 15.2 g/dL (ref 13.0–17.0)
MCH: 29.7 pg (ref 26.0–34.0)
MCHC: 33 g/dL (ref 30.0–36.0)
MCV: 90 fL (ref 80.0–100.0)
Platelets: 223 10*3/uL (ref 150–400)
RBC: 5.11 MIL/uL (ref 4.22–5.81)
RDW: 12.8 % (ref 11.5–15.5)
WBC: 7 10*3/uL (ref 4.0–10.5)
nRBC: 0 % (ref 0.0–0.2)

## 2020-04-24 LAB — PROTIME-INR
INR: 2.2 — ABNORMAL HIGH (ref 0.8–1.2)
Prothrombin Time: 24 seconds — ABNORMAL HIGH (ref 11.4–15.2)

## 2020-04-24 NOTE — Progress Notes (Signed)
PROGRESS NOTE  John Lambert  DOB: 1941-01-29  PCP: Harlan Stains, MD JFH:[id number]  DOA: 04/19/2020  LOS: 3 days   Chief Complaint  Patient presents with  . Leg Pain  . Weakness   Brief narrative: John Lambert is a 79 y.o. 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 Patient presented to the ED on 12/14 with complaint of bilateral lower extremity weakness and difficulty walking associated with pain in the left hip region.  His symptoms started 24 hours prior to presentation while trying to get out of bed.  He managed to get out of bed and was able to ambulate without related to his living room.  He did not have a fall.  He continued to have pain for the whole day.  As at nighttime when he tried to get off his sofa, his legs gave out and he was completely unable to ambulate.  He has a history of neurofibroma tumor removal from his spine about 4 years ago and melanoma removal from right calf about 30 years ago. In the ED, x-rays and CT of left hip and pelvis were negative. INR 2.2. He was kept for overnight observation for further evaluation management.  MRI brain obtained this morning showed  1. Numerous scattered punctate acute infarctions in the left posterior frontal region consistent with micro embolic infarctions which could either be in the anterior or middle cerebral artery territory. Punctate acute infarction also affecting the splenium ofthe corpus callosum. 2. Extensive chronic small-vessel ischemic changes elsewherethroughout the brain. 3. Small old left parietal vertex cortical and subcorticalInfarctions.  MRI lumbar spine showed 1. 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 No recurrent spinal tumor is evident. 2. Superimposed lumbar spine degeneration primarily at L4-L5 and L5-S1. L4-L5 multifactorial mild to  moderate multifactorial spinal stenosis, mild right L4 neural foraminal stenosis. L5-S1 moderate right lateral recess and foraminal stenosis. Query Right L5 and/or S1 radiculitis.  Subjective: Patient was seen and examined this morning.   Propped up in bed.  Not in distress.  Wife at bedside.  No new symptoms.  Patient is waiting for carotid artery stenting on Wednesday.    Assessment/Plan: Acute CVA -Presented with weakness of lower extremities for 24 hours, impaired ambulation -MRI findings as above -A1c 5.3, LDL 84. -Seen by neurology. Seen by PT/OT. CIR recommended. -Prior to admission, patient was on Coumadin and Crestor. -See below for the plan of anticoagulation.  Left carotid artery stenosis -Carotid duplex and CT angio of neck showed significant stenosis of the left carotid artery. -Vascular surgery consult appreciated.  Patient is planned for carotid artery stenting tentatively on Wednesday. -Prior to admission patient was on Coumadin.  As per recommendation by vascular surgery, Coumadin has been switched to full dose Lovenox.  He has been also started on dual antiplatelet coverage with aspirin and Plavix.  Anticoagulation post procedure to be decided by vascular surgery.  Hypertension -Home meds include Coreg and lisinopril. Continue Coreg.  Lisinopril remains on hold. -Continue to monitor.  Blood clotting disorder, history of DVT/PE -On Coumadin at home.  -Ultrasound duplex of lower extremities showed age-indeterminate, probably chronic DVT without symptoms. -Patient is currently on full dose Lovenox  Left thigh pain -Shooting pain, intermittent.  Continue Flexeril nightly as needed.  Mobility: PT eval obtained.  CIR recommended Code Status:   Code Status: Full Code  Nutritional status:  Body mass index is 22.56 kg/m.     Diet Order            Diet Heart Room service appropriate? Yes; Fluid consistency: Thin  Diet effective now                 DVT  prophylaxis:    Antimicrobials:  None Fluid: None Consultants: Neurology Family Communication:  Wife at bedside  Status is: Inpatient  Remains inpatient appropriate because: Pending carotid artery stenting Dispo: The patient is from: Home              Anticipated d/c is to: CIR recommended              Anticipated d/c date is: > 3 days              Patient currently is not medically stable to d/c.           Infusions:    Scheduled Meds: . aspirin EC  81 mg Oral Daily  . carvedilol  25 mg Oral BID WC  . clopidogrel  75 mg Oral Daily  . enoxaparin (LOVENOX) injection  1 mg/kg (Adjusted) Subcutaneous Q12H  . pravastatin  40 mg Oral Daily    Antimicrobials: Anti-infectives (From admission, onward)   None      PRN meds: acetaminophen, cyclobenzaprine   Objective: Vitals:   04/24/20 0334 04/24/20 0847  BP: (!) 117/57 127/72  Pulse: 60 65  Resp: 16 20  Temp: 97.8 F (36.6 C) 97.7 F (36.5 C)  SpO2: 95% 93%    Intake/Output Summary (Last 24 hours) at 04/24/2020 1111 Last data filed at 04/24/2020 1034 Gross per 24 hour  Intake 400 ml  Output 550 ml  Net -150 ml   Filed Weights   04/19/20 0043 04/20/20 0037 04/20/20 2034  Weight: 77.1 kg 75.8 kg 79.7 kg   Weight change:  Body mass index is 22.56 kg/m.   Physical Exam: General exam: Pleasant, elderly Caucasian male. Intermittent left high pain.  Not in distress otherwise Skin: No rashes, lesions or ulcers. HEENT: Atraumatic, normocephalic, no obvious bleeding Lungs: Clear to auscultation bilaterally CVS: Regular rate and rhythm, no murmur GI/Abd soft, nontender, nondistended, bowel sound present CNS: Alert, awake, oriented x3.  Right lower extremity weakness remains. Psychiatry: Mood appropriate Extremities: No pedal edema, no calf tenderness.  Data Review: I have personally reviewed the laboratory data and studies available.  Recent Labs  Lab 04/19/20 0230 04/20/20 0216 04/21/20 0423  04/24/20 0105  WBC 10.8* 9.9 8.0 7.0  NEUTROABS 8.4*  --  5.7  --   HGB 16.5 15.7 15.9 15.2  HCT 49.0 48.5 46.5 46.0  MCV 91.2 91.3 89.3 90.0  PLT 224 195 216 223   Recent Labs  Lab 04/19/20 0230 04/20/20 0216 04/21/20 0423  NA 139 141 140  K 4.0 4.6 4.3  CL 106 105 103  CO2 24 25 25   GLUCOSE 110* 95 94  BUN 19 15 15   CREATININE 0.86 0.94 0.88  CALCIUM 9.0 9.0 8.9  MG  --   --  1.9  PHOS  --   --  3.5    F/u labs ordered.  Signed, Terrilee Croak, MD [facility] 04/24/2020

## 2020-04-24 NOTE — Plan of Care (Signed)

## 2020-04-24 NOTE — Progress Notes (Signed)
Occupational Therapy Treatment Patient Details Name: John Lambert MRN: [id number] DOB: Feb 20, 1941 Today's Date: 04/24/2020    History of present illness John Lambert is a 79 y.o. 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 He also has pain in left upper leg/hip region, and R foot drag; Imaging notable for Numerous scattered punctate acute infarctions in the left  posterior frontal region consistent with micro embolic infarctions  which could either be in the anterior or middle cerebral artery  territory. Punctate acute infarction also affecting the splenium of  the corpus callosum.   OT comments  Pt with excellent participation in session, wife at bed side for therapy and receptive to education. Focus on FM/GM coordination and discussion of benefit of incorporating R UE during functional tasks as pt observed to compensate for all ADL's at this time with LUE and use of R UE as stabilzier. Discussed opening/closing flip top/twist tops/pull tops, dealing cards, writing, reach/grasp release pattern but also to know that onset of fatigue of R UE during functional activities was quick and pt will need to rest to allow for recovery. Pt tolerated transfers this date with Min A Overall no overt LOB but mild weakness and lean to L with UE support on RW. Pt and wife motivated for transition to CIR to maximize indep and safety with ADL's and mobility. OT will continue to follow.     Follow Up Recommendations  CIR    Equipment Recommendations  3 in 1 bedside commode    Recommendations for Other Services Rehab consult    Precautions / Restrictions Precautions Precautions: Fall       Mobility Bed Mobility Overal bed mobility: Needs Assistance Bed Mobility: Supine to Sit Rolling: Min guard Sidelying to sit: Min guard        General bed mobility comments: HOb slightly elevated, use of bed rail to support trunk transition  Transfers Overall transfer level: Needs assistance Equipment used: Rolling walker (2 wheeled) Transfers: Sit to/from Stand Sit to Stand: Min guard Stand pivot transfers: Min assist       General transfer comment: x3 from EOB with cues for hand placement in all trials, tendency for pulling to standing on RW despite education this date. requiring initially min A but improving quickly to min guard with cues for weight shifting    Balance                                           ADL either performed or assessed with clinical judgement   ADL       Grooming: Minimal assistance;Cueing for sequencing   Upper Body Bathing: Set up                             General ADL Comments: focus of session on modification of task completion and FM/GM coordination activities. education to both pt and wife on use of LUE as stabilizer instead of constantly compensating for R hand deficits. educated on task including opening differing sized items with R hand, dealing cards, tying knots, writing but allowing time for rest as R UE appears to fatigue quickly.     Vision       Perception  Praxis      Cognition Arousal/Alertness: Awake/alert Behavior During Therapy: WFL for tasks assessed/performed Overall Cognitive Status: Within Functional Limits for tasks assessed                                 General Comments: wife present endorsing mild changes in attention and indicating that processing is slightly delayed but functionally appears [facility] during session        Exercises Exercises: Other exercises   Shoulder Instructions       General Comments      Pertinent Vitals/ Pain       Pain Assessment: No/denies pain  Home Living                                          Prior Functioning/Environment               Frequency  Min 2X/week        Progress Toward Goals  OT Goals(current goals can now be found in the care plan section)  Progress towards OT goals: Progressing toward goals  Acute Rehab OT Goals Patient Stated Goal: feel like I can do for myself OT Goal Formulation: With patient Time For Goal Achievement: 05/05/20 Potential to Achieve Goals: Good  Plan Discharge plan remains appropriate    Co-evaluation                 AM-PAC OT "6 Clicks" Daily Activity     Outcome Measure   Help from another person eating meals?: A Little Help from another person taking care of personal grooming?: A Little Help from another person toileting, which includes using toliet, bedpan, or urinal?: A Little Help from another person bathing (including washing, rinsing, drying)?: A Little Help from another person to put on and taking off regular upper body clothing?: A Little Help from another person to put on and taking off regular lower body clothing?: A Little 6 Click Score: 18    End of Session Equipment Utilized During Treatment: Gait belt;Rolling walker  OT Visit Diagnosis: Unsteadiness on feet (R26.81);Other abnormalities of gait and mobility (R26.89);Muscle weakness (generalized) (M62.81);Pain Pain - Right/Left: Right Pain - part of body: Shoulder;Arm;Hand   Activity Tolerance Patient tolerated treatment well   Patient Left in bed;with bed alarm set;with call bell/phone within reach;with family/visitor present   Nurse Communication Mobility status     Time: 4128-7867 OT Time Calculation (min): 31 min  Charges: OT General Charges $OT Visit: 1 Visit OT Treatments $Therapeutic Activity: 23-37 mins  Chace Klippel OTR/L acute rehab services Office: [phone]  Toula Moos Zaydah Nawabi 04/24/2020, 1:25 PM

## 2020-04-24 NOTE — Plan of Care (Signed)
  Problem: Nutrition: Goal: Adequate nutrition will be maintained Outcome: Progressing   Problem: Pain Managment: Goal: General experience of comfort will improve Outcome: Progressing   Problem: Safety: Goal: Ability to remain free from injury will improve Outcome: Progressing   

## 2020-04-25 ENCOUNTER — Inpatient Hospital Stay (HOSPITAL_COMMUNITY): Admit: 2020-04-25 | Payer: Medicare Other

## 2020-04-25 LAB — CBC
HCT: 46 % (ref 39.0–52.0)
Hemoglobin: 15.2 g/dL (ref 13.0–17.0)
MCH: 29.9 pg (ref 26.0–34.0)
MCHC: 33 g/dL (ref 30.0–36.0)
MCV: 90.6 fL (ref 80.0–100.0)
Platelets: 211 10*3/uL (ref 150–400)
RBC: 5.08 MIL/uL (ref 4.22–5.81)
RDW: 13.1 % (ref 11.5–15.5)
WBC: 6.5 10*3/uL (ref 4.0–10.5)
nRBC: 0 % (ref 0.0–0.2)

## 2020-04-25 LAB — PROTIME-INR
INR: 1.9 — ABNORMAL HIGH (ref 0.8–1.2)
Prothrombin Time: 21.4 seconds — ABNORMAL HIGH (ref 11.4–15.2)

## 2020-04-25 NOTE — Progress Notes (Signed)
Physical Therapy Treatment Patient Details Name: John Lambert MRN: [id number] DOB: [birth year] Today's Date: 04/25/2020    History of Present Illness John Lambert is a 79 y.o. 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 He also has pain in left upper leg/hip region, and R foot drag; Imaging notable for Numerous scattered punctate acute infarctions in the left  posterior frontal region consistent with micro embolic infarctions  which could either be in the anterior or middle cerebral artery  territory. Punctate acute infarction also affecting the splenium of  the corpus callosum.  Pt with L carotid artery stenosis with plans for stenting on 04/27/20.    PT Comments    Pt making gradual improvements.  Demonstrating improved knee extension with gait today.  Does continue to need min guard to min A for safety and assist with transfers.  Pt with occasional LOB posteriorly upon standing.   Session focused on balance, R LE strength, and improving gait quality/safety.  Pt very motivated.  Continue plan of care.    Follow Up Recommendations  CIR     Equipment Recommendations  Rolling walker with 5" wheels;3in1 (PT);Other (comment)    Recommendations for Other Services Rehab consult     Precautions / Restrictions Precautions Precautions: Fall    Mobility  Bed Mobility Overal bed mobility: Needs Assistance Bed Mobility: Supine to Sit Rolling: Min guard   Supine to sit: Min guard        Transfers Overall transfer level: Needs assistance Equipment used: Rolling walker (2 wheeled) Transfers: Sit to/from Stand Sit to Stand: Min guard;From elevated surface         General transfer comment: Pt performed x 3 for transfers with cues to lean forward and safe hand placment.  Also, see exercises for further sit to  stand.  Ambulation/Gait Ambulation/Gait assistance: Min guard Gait Distance (Feet): 120 Feet (120' then 15') Assistive device: Rolling walker (2 wheeled) Gait Pattern/deviations: Step-to pattern;Decreased step length - left;Decreased stance time - right;Trunk flexed Gait velocity: decreased   General Gait Details: Pt with trunk flexed - reports baseline.  Pt ambulating with step to right gait but was able to get R knee straight today.  As pt fatigued, demonstrated hard heel strike on R - cued to "ease/control" R heel strike.  Also, worked on progressing to step through pattern on L with verbal and visual cues.  Pt able to do a couple steps then resorted back to step to R.  Performed pre-gait activities at sink (see exercises) to encourage toe off on R - then had pt ambulate 15' in room with slight improvement in step through pattern with cues.   Stairs Stairs: Yes Stairs assistance: Min assist Stair Management: Two rails;Step to pattern Number of Stairs: 2 General stair comments: Cues for "up with good, down with bad"   Wheelchair Mobility    Modified Rankin (Stroke Patients Only) Modified Rankin (Stroke Patients Only) Pre-Morbid Rankin Score: No significant disability Modified Rankin: Moderately severe disability     Balance Overall balance assessment: Needs assistance Sitting-balance support: Feet supported Sitting balance-Leahy Scale: Good     Standing balance support: Bilateral upper extremity supported;Single extremity supported Standing balance-Leahy Scale: Poor Standing balance comment: Requiring UE support at all times.  With UE support pt was mostly min guard but did have 2 episodes of posterior LOB with sit  to stand requiing min A                            Cognition Arousal/Alertness: Awake/alert Behavior During Therapy: WFL for tasks assessed/performed Overall Cognitive Status: Within Functional Limits for tasks assessed                                         Exercises Other Exercises Other Exercises: Sit<>stand 3 ways: 3x normal, 5x with visual/tactile target (wipe container) in front on floor for pt to reach toward and then stand to decrease posterior lean, and 3 x from elevated bed with L foot forward to force use R LE - pt having difficulty here requiring min A. Other Exercises: Pre gait exercise: Held onto counter, R foot back, L foot forward and worked on weight shifting with pushing with R LE to simulate toe off.  Performed x 20 Other Exercises: Terminal knee ext: Held onto counter with R foot forward and L foot back worked on shifting onto R knee and extending into terminal extension x 20    General Comments General comments (skin integrity, edema, etc.): wife present      Pertinent Vitals/Pain Pain Assessment: No/denies pain    Home Living                      Prior Function            PT Goals (current goals can now be found in the care plan section) Acute Rehab PT Goals Patient Stated Goal: Progress independence PT Goal Formulation: With patient/family Time For Goal Achievement: 05/04/20 Potential to Achieve Goals: Good Progress towards PT goals: Progressing toward goals    Frequency    Min 4X/week      PT Plan Current plan remains appropriate    Co-evaluation              AM-PAC PT "6 Clicks" Mobility   Outcome Measure  Help needed turning from your back to your side while in a flat bed without using bedrails?: A Little Help needed moving from lying on your back to sitting on the side of a flat bed without using bedrails?: A Little Help needed moving to and from a bed to a chair (including a wheelchair)?: A Little Help needed standing up from a chair using your arms (e.g., wheelchair or bedside chair)?: A Little Help needed to walk in hospital room?: A Little Help needed climbing 3-5 steps with a railing? : A Little 6 Click Score: 18    End of Session Equipment  Utilized During Treatment: Gait belt Activity Tolerance: Patient tolerated treatment well Patient left: in bed;with call bell/phone within reach;with bed alarm set (Sitting edge of bed - reports nurse tech coming into assist with wash up; notified nurse tech PT complete) Nurse Communication: Mobility status PT Visit Diagnosis: Other abnormalities of gait and mobility (R26.89);Muscle weakness (generalized) (M62.81);History of falling (Z91.81)     Time: 0240-9735 PT Time Calculation (min) (ACUTE ONLY): 25 min  Charges:  $Gait Training: 8-22 mins $Therapeutic Exercise: 8-22 mins                     Abran Richard, PT Acute Rehab Services Pager [phone] [facility] [place] 04/25/2020, 5:31 PM

## 2020-04-25 NOTE — Progress Notes (Signed)
Inpatient Rehabilitation Admissions Coordinator  I met at bedside with patient , wife and daughter. Noted for surgery on Wednesday. We will follow up postoperatively to assist with determining rehab needs at that time.  Danne Baxter, RN, MSN Rehab Admissions Coordinator [phone] 04/25/2020 11:28 AM

## 2020-04-25 NOTE — Plan of Care (Signed)
  Problem: Activity: Goal: Risk for activity intolerance will decrease Outcome: Progressing   Problem: Clinical Measurements: Goal: Cardiovascular complication will be avoided Outcome: Progressing   Problem: Clinical Measurements: Goal: Will remain free from infection Outcome: Progressing   Problem: Health Behavior/Discharge Planning: Goal: Ability to manage health-related needs will improve Outcome: Progressing

## 2020-04-25 NOTE — Progress Notes (Signed)
Pt denies any new events.    Pt now bridged with Lovenox and plavix/asa were started over the weekend.    For left [facility]AR Wednesday by Dr. Trula Slade.  Leontine Locket, [facility] 04/25/2020 10:35 AM

## 2020-04-25 NOTE — Progress Notes (Signed)
PROGRESS NOTE    John Lambert  RSW:[id number] DOB: 04/24/1941 DOA: 04/19/2020 PCP: Harlan Stains, MD     Brief Narrative:  This 79 y.o. male with PMH significant for hypertension, melanoma, neurofibroma, clotting disorder, history of DVT and PE on chronic Coumadin presented to the ED on 12/14 with c/o : bilateral lower extremity weakness and difficulty ambulating associated with pain in the left hip region. He has a history of neurofibroma tumor removal from his spine about 4 years ago and melanoma removal from right calf about 30 years ago. MRI brain obtained this morning showed  1. Numerous scattered punctate acute infarctions in the left posterior frontal region consistent with micro embolic infarctions which could either be in the anterior or middle cerebral artery territory. Punctate acute infarction also affecting the splenium ofthe corpus callosum. 2. Extensive chronic small-vessel ischemic changes elsewherethroughout the brain. 3. Small old left parietal vertex cortical and subcorticalInfarctions.  MRI lumbar spine showed 1. 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 No recurrent spinal tumor is evident. 2. Superimposed lumbar spine degeneration primarily at L4-L5 and L5-S1. L4-L5 multifactorial mild to moderate multifactorial spinal stenosis, mild right L4 neural foraminal stenosis. L5-S1 moderate right lateral recess and foraminal stenosis. Query Right L5 and/or S1 radiculitis.  Assessment & Plan:   Principal Problem:   Weakness Active Problems:   Anticoagulated on warfarin   Impaired ambulation   Fall at home, initial encounter   Essential hypertension   Blood clotting disorder ([place])   Cerebral embolism with cerebral infarction   Stroke [facility])   Acute CVA -Patient presented with weakness of lower extremities for 24 hours, impaired ambulation. -MRI MRI brain shows numerous punctate acute  infarction. -Hb A1c 5.3, LDL 84. -Seen by neurology. Seen by PT/OT. CIR recommended. -Prior to admission, patient was on Coumadin and Crestor. -Continue Crestor, Coumadin transition to Lovenox.  Left carotid artery stenosis -Carotid duplex and CT angio of neck showed significant stenosis of the left carotid artery. -Vascular surgery consult appreciated.  Patient is tentatively scheduled for carotid artery stenting tentatively on Wednesday. -Prior to admission patient was on Coumadin.  As per recommendation by vascular surgery, Coumadin has been switched to full dose Lovenox.  He has been also started on dual antiplatelet coverage with aspirin and Plavix.  Anticoagulation post procedure to be decided by vascular surgery.  Hypertension:  -Home meds include Coreg and lisinopril. Continue Coreg.  Lisinopril remains on hold. -Continue to monitor.  Blood clotting disorder, history of DVT/PE -On Coumadin at home.  -Ultrasound duplex of lower extremities showed age-indeterminate, probably chronic DVT without symptoms. -Patient is currently on full dose Lovenox  Left thigh pain -Shooting pain, intermittent.  Continue Flexeril nightly as needed.   DVT prophylaxis: Lovenox Code Status: Full code Family Communication: Wife and daughter at bedside. Disposition Plan:   Status is: Inpatient  Remains inpatient appropriate because:Inpatient level of care appropriate due to severity of illness   Dispo: The patient is from: Home              Anticipated d/c is to: CIR              Anticipated d/c date is: > 3 days              Patient currently is not medically stable to d/c.   Consultants:   Vascular surgery.  Procedures:  Antimicrobials: Anti-infectives (From admission, onward)   None  Subjective: Patient was seen and examined at bedside.  Does not appear in any distress.  He was ambulating in the room with a walker. Patient is anticipating carotid artery stenting on Wed  12/22.  Objective: Vitals:   04/24/20 2319 04/25/20 0437 04/25/20 0758 04/25/20 1140  BP: 131/76 120/79 (!) 149/91 133/75  Pulse: 65 62 64 75  Resp: 16 18 18 18   Temp: (!) 97.4 F (36.3 C) 97.7 F (36.5 C) 97.6 F (36.4 C) 98 F (36.7 C)  TempSrc: Oral Oral Oral Oral  SpO2: 93% 94% 96% 97%  Weight:      Height:        Intake/Output Summary (Last 24 hours) at 04/25/2020 1336 Last data filed at 04/25/2020 0810 Gross per 24 hour  Intake 390 ml  Output 750 ml  Net -360 ml   Filed Weights   04/19/20 0043 04/20/20 0037 04/20/20 2034  Weight: 77.1 kg 75.8 kg 79.7 kg    Examination:  General exam: Appears calm and comfortable, not in any distress Respiratory system: Clear to auscultation. Respiratory effort normal. Cardiovascular system: S1 & S2 heard, RRR. No JVD, murmurs, rubs, gallops or clicks. No pedal edema. Gastrointestinal system: Abdomen is nondistended, soft and nontender. No organomegaly or masses felt. Normal bowel sounds heard. Central nervous system: Alert and oriented. No focal neurological deficits. Extremities: No edema, no cyanosis, no clubbing. Skin: No rashes, lesions or ulcers Psychiatry: Judgement and insight appear normal. Mood & affect appropriate.     Data Reviewed: I have personally reviewed following labs and imaging studies  CBC: Recent Labs  Lab 04/19/20 0230 04/20/20 0216 04/21/20 0423 04/24/20 0105 04/25/20 0237  WBC 10.8* 9.9 8.0 7.0 6.5  NEUTROABS 8.4*  --  5.7  --   --   HGB 16.5 15.7 15.9 15.2 15.2  HCT 49.0 48.5 46.5 46.0 46.0  MCV 91.2 91.3 89.3 90.0 90.6  PLT 224 195 216 223 657   Basic Metabolic Panel: Recent Labs  Lab 04/19/20 0230 04/20/20 0216 04/21/20 0423  NA 139 141 140  K 4.0 4.6 4.3  CL 106 105 103  CO2 24 25 25   GLUCOSE 110* 95 94  BUN 19 15 15   CREATININE 0.86 0.94 0.88  CALCIUM 9.0 9.0 8.9  MG  --   --  1.9  PHOS  --   --  3.5   GFR: Estimated Creatinine Clearance: 74.2 mL/min (by C-G formula  based on SCr of 0.88 mg/dL). Liver Function Tests: Recent Labs  Lab 04/19/20 0230  AST 18  ALT 16  ALKPHOS 57  BILITOT 0.9  PROT 6.8  ALBUMIN 4.1   No results for input(s): LIPASE, AMYLASE in the last 168 hours. No results for input(s): AMMONIA in the last 168 hours. Coagulation Profile: Recent Labs  Lab 04/21/20 0423 04/22/20 0432 04/23/20 0316 04/24/20 0105 04/25/20 0237  INR 2.1* 2.1* 2.0* 2.2* 1.9*   Cardiac Enzymes: No results for input(s): CKTOTAL, CKMB, CKMBINDEX, TROPONINI in the last 168 hours. BNP (last 3 results) No results for input(s): PROBNP in the last 8760 hours. HbA1C: No results for input(s): HGBA1C in the last 72 hours. CBG: No results for input(s): GLUCAP in the last 168 hours. Lipid Profile: No results for input(s): CHOL, HDL, LDLCALC, TRIG, CHOLHDL, LDLDIRECT in the last 72 hours. Thyroid Function Tests: No results for input(s): TSH, T4TOTAL, FREET4, T3FREE, THYROIDAB in the last 72 hours. Anemia Panel: No results for input(s): VITAMINB12, FOLATE, FERRITIN, TIBC, IRON, RETICCTPCT in the last 72 hours.  Sepsis Labs: No results for input(s): PROCALCITON, LATICACIDVEN in the last 168 hours.  Recent Results (from the past 240 hour(s))  Resp Panel by RT-PCR (Flu A&B, Covid) Nasopharyngeal Swab     Status: None   Collection Time: 04/19/20  6:20 AM   Specimen: Nasopharyngeal Swab; Nasopharyngeal(NP) swabs in vial transport medium  Result Value Ref Range Status   SARS Coronavirus 2 by RT PCR NEGATIVE NEGATIVE Final    Comment: (NOTE) SARS-CoV-2 target nucleic acids are NOT DETECTED.  The SARS-CoV-2 RNA is generally detectable in upper respiratory specimens during the acute phase of infection. The lowest concentration of SARS-CoV-2 viral copies this assay can detect is 138 copies/mL. A negative result does not preclude SARS-Cov-2 infection and should not be used as the sole basis for treatment or other patient management decisions. A negative result  may occur with  improper specimen collection/handling, submission of specimen other than nasopharyngeal swab, presence of viral mutation(s) within the areas targeted by this assay, and inadequate number of viral copies(<138 copies/mL). A negative result must be combined with clinical observations, patient history, and epidemiological information. The expected result is Negative.  Fact Sheet for Patients:  EntrepreneurPulse.com.au  Fact Sheet for Healthcare Providers:  IncredibleEmployment.be  This test is no t yet approved or cleared by the [place] FDA and  has been authorized for detection and/or diagnosis of SARS-CoV-2 by FDA under an Emergency Use Authorization (EUA). This EUA will remain  in effect (meaning this test can be used) for the duration of the COVID-19 declaration under Section 564(b)(1) of the Act, 21 U.S.C.section 360bbb-3(b)(1), unless the authorization is terminated  or revoked sooner.       Influenza A by PCR NEGATIVE NEGATIVE Final   Influenza B by PCR NEGATIVE NEGATIVE Final    Comment: (NOTE) The Xpert Xpress SARS-CoV-2/FLU/RSV plus assay is intended as an aid in the diagnosis of influenza from Nasopharyngeal swab specimens and should not be used as a sole basis for treatment. Nasal washings and aspirates are unacceptable for Xpert Xpress SARS-CoV-2/FLU/RSV testing.  Fact Sheet for Patients: EntrepreneurPulse.com.au  Fact Sheet for Healthcare Providers: IncredibleEmployment.be  This test is not yet approved or cleared by the [place] FDA and has been authorized for detection and/or diagnosis of SARS-CoV-2 by FDA under an Emergency Use Authorization (EUA). This EUA will remain in effect (meaning this test can be used) for the duration of the COVID-19 declaration under Section 564(b)(1) of the Act, 21 U.S.C. section 360bbb-3(b)(1), unless the authorization is terminated  or revoked.  Performed at [facility], [address], [place], [place] [address]     Radiology Studies: No results found.  Scheduled Meds: . aspirin EC  81 mg Oral Daily  . carvedilol  25 mg Oral BID WC  . clopidogrel  75 mg Oral Daily  . enoxaparin (LOVENOX) injection  1 mg/kg (Adjusted) Subcutaneous Q12H  . pravastatin  40 mg Oral Daily   Continuous Infusions:   LOS: 4 days    Time spent: 35 mins    Kaedence Connelly, MD [facility]   If 7PM-7AM, please contact night-coverage

## 2020-04-25 NOTE — Plan of Care (Signed)
  Problem: Education: Goal: Knowledge of General Education information will improve Description: Including pain rating scale, medication(s)/side effects and non-pharmacologic comfort measures Outcome: Progressing   Problem: Clinical Measurements: Goal: Diagnostic test results will improve Outcome: Progressing Goal: Respiratory complications will improve Outcome: Progressing   Problem: Elimination: Goal: Will not experience complications related to urinary retention Outcome: Progressing   Problem: Skin Integrity: Goal: Risk for impaired skin integrity will decrease Outcome: Progressing

## 2020-04-26 ENCOUNTER — Encounter (HOSPITAL_COMMUNITY): Payer: Self-pay | Admitting: Surgery

## 2020-04-26 LAB — CBC
HCT: 45 % (ref 39.0–52.0)
HCT: 49.2 % (ref 39.0–52.0)
Hemoglobin: 15.3 g/dL (ref 13.0–17.0)
Hemoglobin: 16.2 g/dL (ref 13.0–17.0)
MCH: 30.4 pg (ref 26.0–34.0)
MCH: 29.5 pg (ref 26.0–34.0)
MCHC: 34 g/dL (ref 30.0–36.0)
MCHC: 32.9 g/dL (ref 30.0–36.0)
MCV: 89.5 fL (ref 80.0–100.0)
MCV: 89.6 fL (ref 80.0–100.0)
Platelets: 216 10*3/uL (ref 150–400)
Platelets: 224 10*3/uL (ref 150–400)
RBC: 5.03 MIL/uL (ref 4.22–5.81)
RBC: 5.49 MIL/uL (ref 4.22–5.81)
RDW: 13.2 % (ref 11.5–15.5)
RDW: 13.2 % (ref 11.5–15.5)
WBC: 7.2 10*3/uL (ref 4.0–10.5)
WBC: 6.9 10*3/uL (ref 4.0–10.5)
nRBC: 0 % (ref 0.0–0.2)
nRBC: 0 % (ref 0.0–0.2)

## 2020-04-26 LAB — COMPREHENSIVE METABOLIC PANEL
ALT: 28 U/L (ref 0–44)
AST: 30 U/L (ref 15–41)
Albumin: 3.5 g/dL (ref 3.5–5.0)
Alkaline Phosphatase: 52 U/L (ref 38–126)
Anion gap: 9 (ref 5–15)
BUN: 15 mg/dL (ref 8–23)
CO2: 24 mmol/L (ref 22–32)
Calcium: 8.6 mg/dL — ABNORMAL LOW (ref 8.9–10.3)
Chloride: 104 mmol/L (ref 98–111)
Creatinine, Ser: 0.87 mg/dL (ref 0.61–1.24)
GFR, Estimated: >60 mL/min (ref >60)
Glucose, Bld: 113 mg/dL — ABNORMAL HIGH (ref 70–99)
Potassium: 4.1 mmol/L (ref 3.5–5.1)
Sodium: 137 mmol/L (ref 135–145)
Total Bilirubin: 0.8 mg/dL (ref 0.3–1.2)
Total Protein: 6.3 g/dL — ABNORMAL LOW (ref 6.5–8.1)

## 2020-04-26 LAB — PROTIME-INR
INR: 1.6 — ABNORMAL HIGH (ref 0.8–1.2)
INR: 1.3 — ABNORMAL HIGH (ref 0.8–1.2)
Prothrombin Time: 18.7 seconds — ABNORMAL HIGH (ref 11.4–15.2)
Prothrombin Time: 15.9 seconds — ABNORMAL HIGH (ref 11.4–15.2)

## 2020-04-26 LAB — BASIC METABOLIC PANEL
Anion gap: 11 (ref 5–15)
BUN: 16 mg/dL (ref 8–23)
CO2: 22 mmol/L (ref 22–32)
Calcium: 8.3 mg/dL — ABNORMAL LOW (ref 8.9–10.3)
Chloride: 105 mmol/L (ref 98–111)
Creatinine, Ser: 0.77 mg/dL (ref 0.61–1.24)
GFR, Estimated: >60 mL/min (ref >60)
Glucose, Bld: 96 mg/dL (ref 70–99)
Potassium: 3.8 mmol/L (ref 3.5–5.1)
Sodium: 138 mmol/L (ref 135–145)

## 2020-04-26 LAB — TYPE AND SCREEN
ABO/RH(D): A POS
Antibody Screen: NEGATIVE

## 2020-04-26 LAB — PHOSPHORUS: Phosphorus: 3.6 mg/dL (ref 2.5–4.6)

## 2020-04-26 LAB — ABO/RH: ABO/RH(D): A POS

## 2020-04-26 LAB — APTT: aPTT: 39 seconds — ABNORMAL HIGH (ref 24–36)

## 2020-04-26 LAB — MAGNESIUM: Magnesium: 1.9 mg/dL (ref 1.7–2.4)

## 2020-04-26 MED ORDER — CHLORHEXIDINE GLUCONATE CLOTH 2 % EX PADS
6.0000 | MEDICATED_PAD | Freq: Once | CUTANEOUS | Status: AC
  Administered 2020-04-27: 05:00:00 6 via TOPICAL

## 2020-04-26 MED ORDER — SODIUM CHLORIDE 0.9 % IV SOLN: INTRAVENOUS | Status: DC

## 2020-04-26 MED ORDER — CEFAZOLIN SODIUM-DEXTROSE 1-4 GM/50ML-% IV SOLN: 1.0000 g | INTRAVENOUS | Status: DC

## 2020-04-26 MED ORDER — CEFAZOLIN SODIUM-DEXTROSE 2-4 GM/100ML-% IV SOLN
2.0000 g | INTRAVENOUS | Status: AC
  Administered 2020-04-27: 08:00:00 2 g via INTRAVENOUS
  Filled 2020-04-26: qty 100

## 2020-04-26 MED ORDER — CHLORHEXIDINE GLUCONATE CLOTH 2 % EX PADS
6.0000 | MEDICATED_PAD | Freq: Once | CUTANEOUS | Status: AC
  Administered 2020-04-26: 21:00:00 6 via TOPICAL

## 2020-04-26 MED ORDER — CEFAZOLIN SODIUM-DEXTROSE 2-4 GM/100ML-% IV SOLN: 2.0000 g | INTRAVENOUS | Status: DC

## 2020-04-26 NOTE — Progress Notes (Signed)
Physical Therapy Treatment Patient Details Name: John Lambert MRN: [id number] DOB: 09-Nov-1940 Today's Date: 04/26/2020    History of Present Illness John Lambert is a 79 y.o. 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 He also has pain in left upper leg/hip region, and R foot drag; Imaging notable for Numerous scattered punctate acute infarctions in the left  posterior frontal region consistent with micro embolic infarctions  which could either be in the anterior or middle cerebral artery  territory. Punctate acute infarction also affecting the splenium of  the corpus callosum.  Pt with L carotid artery stenosis with plans for stenting on 04/27/20.    PT Comments    Patient very motivated. Focused on transfers from elevated EOB with anterior shift over BOS and increasing use of legs to control descent. Gait training focus on rt heelstrike and increased step length on left.    Follow Up Recommendations  CIR     Equipment Recommendations  Rolling walker with 5" wheels;3in1 (PT);Other (comment)    Recommendations for Other Services       Precautions / Restrictions Precautions Precautions: Fall    Mobility  Bed Mobility Overal bed mobility: Needs Assistance Bed Mobility: Supine to Sit;Sit to Supine     Supine to sit: Min guard Sit to supine: Min guard   General bed mobility comments: HOb slightly elevated, use of bed rail to support trunk transition  Transfers Overall transfer level: Needs assistance Equipment used: Rolling walker (2 wheeled) Transfers: Sit to/from Stand Sit to Stand: From elevated surface;Min assist (bed elevated to simulate home)         General transfer comment: x 7 throughout session; Left foot forward of right to incr use of RLE; focus on anterior wt-shift "nose should go past the  crossbar of RW  Ambulation/Gait Ambulation/Gait assistance: Min guard Gait Distance (Feet): 110 Feet (x 2; seated rest) Assistive device: Rolling walker (2 wheeled) Gait Pattern/deviations: Step-to pattern;Decreased step length - left;Decreased stance time - right;Trunk flexed;Step-through pattern;Decreased dorsiflexion - right Gait velocity: decreased   General Gait Details: Pt with trunk flexed - reports baseline.  Pt ambulating with step to right gait but was able to progress to step through pattern on L with verbal and visual cues. Patient maintaining step through and added working on rt ankle DF/heelstrike.   Stairs             Wheelchair Mobility    Modified Rankin (Stroke Patients Only) Modified Rankin (Stroke Patients Only) Pre-Morbid Rankin Score: No significant disability Modified Rankin: Moderately severe disability     Balance Overall balance assessment: Needs assistance Sitting-balance support: Feet supported Sitting balance-Leahy Scale: Good Sitting balance - Comments: Sitting EOB no overt LOB, supervision for safety.   Standing balance support: Bilateral upper extremity supported;Single extremity supported Standing balance-Leahy Scale: Poor Standing balance comment: Requiring UE support at all times.  With UE support pt was mostly min guard but did have 2 episodes of posterior LOB with sit to stand requiing min A                            Cognition Arousal/Alertness: Awake/alert Behavior During Therapy: WFL for tasks assessed/performed Overall Cognitive Status: Within Functional Limits for tasks assessed  Exercises      General Comments        Pertinent Vitals/Pain Pain Assessment: Faces Faces Pain Scale: No hurt    Home Living Family/patient expects to be discharged to:: Private residence Living Arrangements: Spouse/significant other Available Help at Discharge:  Family;Available 24 hours/day Type of Home: House Home Access: Stairs to enter   Home Layout: One level Home Equipment: Environmental consultant - 4 wheels      Prior Function Level of Independence: Independent with assistive device(s)      Comments: Started using Rollator RW a few months ago after a fall   PT Goals (current goals can now be found in the care plan section) Acute Rehab PT Goals Patient Stated Goal: Progress independence Time For Goal Achievement: 05/04/20 Potential to Achieve Goals: Good Progress towards PT goals: Progressing toward goals    Frequency    Min 4X/week      PT Plan Current plan remains appropriate    Co-evaluation              AM-PAC PT "6 Clicks" Mobility   Outcome Measure  Help needed turning from your back to your side while in a flat bed without using bedrails?: A Little Help needed moving from lying on your back to sitting on the side of a flat bed without using bedrails?: A Little Help needed moving to and from a bed to a chair (including a wheelchair)?: A Little Help needed standing up from a chair using your arms (e.g., wheelchair or bedside chair)?: A Little Help needed to walk in hospital room?: A Little Help needed climbing 3-5 steps with a railing? : A Little 6 Click Score: 18    End of Session Equipment Utilized During Treatment: Gait belt Activity Tolerance: Patient tolerated treatment well Patient left: in bed;with call bell/phone within reach;with bed alarm set;with family/visitor present   PT Visit Diagnosis: Other abnormalities of gait and mobility (R26.89);Muscle weakness (generalized) (M62.81);History of falling (Z91.81)     Time: 7494-4967 PT Time Calculation (min) (ACUTE ONLY): 35 min  Charges:  $Gait Training: 23-37 mins                      Arby Barrette, PT Pager [phone]    Rexanne Mano 04/26/2020, 4:06 PM

## 2020-04-26 NOTE — TOC Progression Note (Signed)
Transition of Care [facility]) - Progression Note    Patient Details  Name: John Lambert MRN: [id number] Date of Birth: [birth year]  [facility] of [facility]) CM/SW Contact  Pollie Friar, RN Phone Number: 04/26/2020, 11:54 AM  Clinical Narrative:    Pt and family choosing [facility] after his TCAR. CM has faxed updated therapy notes and MD notes to [facility] for insurance authorization. She is aware pt may be ready on Thursday after surgery.  TOC following.   Expected Discharge Plan: IP Rehab Facility Barriers to Discharge: Continued Medical Work up  Expected Discharge Plan and Services Expected Discharge Plan: [place]   Discharge Planning Services: CM Consult Post Acute Care Choice: IP Rehab Living arrangements for the past 2 months: Single Family Home                                       Social Determinants of Health (SDOH) Interventions    Readmission Risk Interventions No flowsheet data found.

## 2020-04-26 NOTE — Anesthesia Preprocedure Evaluation (Addendum)
Anesthesia Evaluation  Patient identified by MRN, date of birth, ID band Patient awake    Reviewed: Allergy & Precautions, NPO status , Patient's Chart, lab work & pertinent test results  Airway Mallampati: II  TM Distance: >3 FB Neck ROM: Full    Dental  (+) Dental Advisory Given, Chipped   Pulmonary neg pulmonary ROS, former smoker,    Pulmonary exam normal breath sounds clear to auscultation       Cardiovascular hypertension, Pt. on medications and Pt. on home beta blockers Normal cardiovascular exam Rhythm:Regular Rate:Normal  Echo 04/2020 1. Left ventricular ejection fraction, by estimation, is 60 to 65%. The left ventricle has normal function. The left ventricle has no regional wall motion abnormalities. There is moderate left ventricular hypertrophy of the basal-septal segment. Left ventricular diastolic parameters are consistent with Grade I diastolic dysfunction (impaired relaxation).  2. Right ventricular systolic function is normal. The right ventricular size is normal. Tricuspid regurgitation signal is inadequate for assessing PA pressure.  3. The mitral valve is normal in structure. Trivial mitral valve regurgitation. No evidence of mitral stenosis.  4. The aortic valve is tricuspid. Aortic valve regurgitation is trivial. Mild to moderate aortic valve sclerosis/calcification is present, without  any evidence of aortic stenosis.  5. The inferior vena cava is normal in size with <50% respiratory variability, suggesting right atrial pressure of 8 mmHg.    Neuro/Psych CVA    GI/Hepatic negative GI ROS, Neg liver ROS,   Endo/Other  negative endocrine ROS  Renal/GU negative Renal ROS     Musculoskeletal negative musculoskeletal ROS (+)   Abdominal   Peds  Hematology  (+) Blood dyscrasia, ,   Anesthesia Other Findings   Reproductive/Obstetrics                           Anesthesia  Physical Anesthesia Plan  ASA: III  Anesthesia Plan: General   Post-op Pain Management:    Induction: Intravenous  PONV Risk Score and Plan: Ondansetron, Dexamethasone and Treatment may vary due to age or medical condition  Airway Management Planned: Oral ETT  Additional Equipment: Arterial line  Intra-op Plan:   Post-operative Plan: Extubation in OR  Informed Consent: I have reviewed the patients History and Physical, chart, labs and discussed the procedure including the risks, benefits and alternatives for the proposed anesthesia with the patient or authorized representative who has indicated his/her understanding and acceptance.     Dental advisory given  Plan Discussed with: CRNA  Anesthesia Plan Comments:        Anesthesia Quick Evaluation

## 2020-04-26 NOTE — H&P (View-Only) (Signed)
    Subjective  -   No acute events Feels like his deficits are improving   Physical Exam:  Nonlabored breathing Baseline neurologic exam Abdomen soft nontender CV: Regular rhythm       Assessment/Plan:   Symptomatic left carotid stenosis: Plan is for left sided TCAR tomorrow.  Details of the procedure as well as risks and benefits were extensively discussed with the patient, his wife and daughter.  All questions were answered.  He will be n.p.o. after midnight. INR was 1.6 today.  He is off Coumadin and getting a Lovenox bridge.  He has been loaded on Plavix and is on a statin.  John Lambert 04/26/2020 11:59 AM --  Vitals:   04/26/20 0331 04/26/20 0800  BP: 128/69 138/75  Pulse: 71 77  Resp: 18   Temp: 98 F (36.7 C) 97.9 F (36.6 C)  SpO2: 94% 95%    Intake/Output Summary (Last 24 hours) at 04/26/2020 1159 Last data filed at 04/26/2020 1638 Gross per 24 hour  Intake 320 ml  Output 325 ml  Net -5 ml     Laboratory CBC    Component Value Date/Time   WBC 7.2 04/26/2020 0210   HGB 15.3 04/26/2020 0210   HCT 45.0 04/26/2020 0210   PLT 216 04/26/2020 0210    BMET    Component Value Date/Time   NA 138 04/26/2020 0210   K 3.8 04/26/2020 0210   CL 105 04/26/2020 0210   CO2 22 04/26/2020 0210   GLUCOSE 96 04/26/2020 0210   BUN 16 04/26/2020 0210   CREATININE 0.77 04/26/2020 0210   CALCIUM 8.3 (L) 04/26/2020 0210   GFRNONAA >60 04/26/2020 0210    COAG Lab Results  Component Value Date   INR 1.6 (H) 04/26/2020   INR 1.9 (H) 04/25/2020   INR 2.2 (H) 04/24/2020   PROTIME 39.6 (H) 03/19/2007   No results found for: PTT  Antibiotics Anti-infectives (From admission, onward)   Start     Dose/Rate Route Frequency Ordered Stop   04/27/20 0600  ceFAZolin (ANCEF) IVPB 2g/100 mL premix       Note to Pharmacy: Send with pt to OR   2 g 200 mL/hr over 30 Minutes Intravenous On call 04/26/20 0805 04/28/20 0600   04/27/20 0000  ceFAZolin (ANCEF) IVPB  1 g/50 mL premix  Status:  Discontinued       Note to Pharmacy: Send with pt to OR   1 g 100 mL/hr over 30 Minutes Intravenous On call 04/26/20 0800 04/26/20 0805       V. Leia Alf, M.D., [facility] [place] Office: [phone] Pager:  [phone]

## 2020-04-26 NOTE — Progress Notes (Signed)
Pt for left TCAR tomorrow.  Pre op orders placed.   Npo after MN/consent/labs   Leontine Locket, [facility] 04/26/2020 8:00 AM

## 2020-04-26 NOTE — Progress Notes (Signed)
Occupational Therapy Treatment Patient Details Name: John Lambert MRN: [id number] DOB: July 17, 1940 Today's Date: 04/26/2020    History of present illness John Lambert is a 79 y.o. 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 He also has pain in left upper leg/hip region, and R foot drag; Imaging notable for Numerous scattered punctate acute infarctions in the left  posterior frontal region consistent with micro embolic infarctions  which could either be in the anterior or middle cerebral artery  territory. Punctate acute infarction also affecting the splenium of  the corpus callosum.  Pt with L carotid artery stenosis with plans for stenting on 04/27/20.   OT comments  Pt making steady progress towards OT goals this session. Session focus on RUE [facility] therex to increase strength and AROM for higher level BADL tasks. Pt completed therex as indicate below with no reports of increased pain. Additionally, issued pt built up foam to assist functional grasp as well as squeeze ball to increase active grasp and release ROM. Pt would continue to benefit from skilled occupational therapy while admitted and after d/c to address the below listed limitations in order to improve overall functional mobility and facilitate independence with BADL participation. DC plan remains appropriate, will follow acutely per POC.     Follow Up Recommendations  CIR    Equipment Recommendations  3 in 1 bedside commode    Recommendations for Other Services      Precautions / Restrictions Precautions Precautions: Fall Restrictions Weight Bearing Restrictions: No       Mobility Bed Mobility   General bed mobility comments: session conducted from chair position in bed as session focus on RUE therex  Transfers         General transfer comment:  session conducted from chair position in bed as session focus on RUE therex    Balance Overall balance assessment: Needs assistance                           ADL either performed or assessed with clinical judgement   ADL Overall ADL's : Needs assistance/impaired Eating/Feeding: Set up;Sitting Eating/Feeding Details (indicate cue type and reason): pt reports deficits gripping utensils, issued pt built up foam to assist with grip   Grooming Details (indicate cue type and reason): demo'ed how to use built up foam on toothbrush                               General ADL Comments: session focus on RUE [facility] therex to increase strength and AROM for higher level grooming tasks     Vision       Perception     Praxis      Cognition Arousal/Alertness: Awake/alert Behavior During Therapy: WFL for tasks assessed/performed Overall Cognitive Status: Within Functional Limits for tasks assessed                                 General Comments: not formally assessed but [facility] for tasks related to therex        Exercises Hand Exercises Forearm Supination: AROM;Right;10 reps;Supine Forearm Pronation: AROM;Right;10 reps;Supine Wrist Flexion: AROM;Right;10 reps;Supine Wrist Extension: AROM;Right;10 reps;Supine Digit Composite Flexion: AROM;Right;10 reps;Supine Composite Extension: AROM;Right;10  reps;Supine Digit Composite Abduction: AROM;Right;10 reps;Supine Digit Composite Adduction: AROM;Right;10 reps;Supine Digit Lifts: AROM;Right;10 reps;Supine Opposition: AROM;Right;10 reps;Supine Other Exercises Other Exercises: issued pt squeeze ball to facilitate functional grasp for higher level BADL tasks Other Exercises: issued pt red built up foam to assist with functional grasp for ADL items   Shoulder Instructions       General Comments pts wife present for session    Pertinent Vitals/ Pain       Pain Assessment: No/denies pain Faces Pain Scale: No  hurt  Home Living Family/patient expects to be discharged to:: Private residence Living Arrangements: Spouse/significant other Available Help at Discharge: Family;Available 24 hours/day Type of Home: House Home Access: Stairs to enter [employer] of Steps: 1 (through garage)   Home Layout: One level     Bathroom Shower/Tub: Teacher, early years/pre: Standard     Home Equipment: Environmental consultant - 4 wheels      Lives With: Spouse    Prior Functioning/Environment Level of Independence: Independent with assistive device(s)        Comments: Started using Rollator RW a few months ago after a fall   Frequency  Min 2X/week        Progress Toward Goals  OT Goals(current goals can now be found in the care plan section)  Progress towards OT goals: Progressing toward goals  Acute Rehab OT Goals Patient Stated Goal: Progress independence OT Goal Formulation: With patient Time For Goal Achievement: 05/05/20 Potential to Achieve Goals: Good  Plan Discharge plan remains appropriate;Frequency remains appropriate    Co-evaluation                 AM-PAC OT "6 Clicks" Daily Activity     Outcome Measure   Help from another person eating meals?: A Little Help from another person taking care of personal grooming?: A Little Help from another person toileting, which includes using toliet, bedpan, or urinal?: A Little Help from another person bathing (including washing, rinsing, drying)?: A Little Help from another person to put on and taking off regular upper body clothing?: A Little Help from another person to put on and taking off regular lower body clothing?: A Little 6 Click Score: 18    End of Session    OT Visit Diagnosis: Unsteadiness on feet (R26.81);Other abnormalities of gait and mobility (R26.89);Muscle weakness (generalized) (M62.81);Pain Pain - Right/Left: Right Pain - part of body: Shoulder;Arm;Hand   Activity Tolerance Patient tolerated  treatment well   Patient Left in bed;with call bell/phone within reach;with bed alarm set;with family/visitor present   Nurse Communication Mobility status        Time: 8502-7741 OT Time Calculation (min): 17 min  Charges: OT General Charges $OT Visit: 1 Visit OT Treatments $Therapeutic Exercise: 8-22 mins  Lanier Clam., COTA/L Acute Rehabilitation Services [phone] [place] 04/26/2020, 4:34 PM

## 2020-04-26 NOTE — Progress Notes (Signed)
    Subjective  -   No acute events Feels like his deficits are improving   Physical Exam:  Nonlabored breathing Baseline neurologic exam Abdomen soft nontender CV: Regular rhythm       Assessment/Plan:   Symptomatic left carotid stenosis: Plan is for left sided TCAR tomorrow.  Details of the procedure as well as risks and benefits were extensively discussed with the patient, his wife and daughter.  All questions were answered.  He will be n.p.o. after midnight. INR was 1.6 today.  He is off Coumadin and getting a Lovenox bridge.  He has been loaded on Plavix and is on a statin.  Wells Pervis Macintyre 04/26/2020 11:59 AM --  Vitals:   04/26/20 0331 04/26/20 0800  BP: 128/69 138/75  Pulse: 71 77  Resp: 18   Temp: 98 F (36.7 C) 97.9 F (36.6 C)  SpO2: 94% 95%    Intake/Output Summary (Last 24 hours) at 04/26/2020 1159 Last data filed at 04/26/2020 3845 Gross per 24 hour  Intake 320 ml  Output 325 ml  Net -5 ml     Laboratory CBC    Component Value Date/Time   WBC 7.2 04/26/2020 0210   HGB 15.3 04/26/2020 0210   HCT 45.0 04/26/2020 0210   PLT 216 04/26/2020 0210    BMET    Component Value Date/Time   NA 138 04/26/2020 0210   K 3.8 04/26/2020 0210   CL 105 04/26/2020 0210   CO2 22 04/26/2020 0210   GLUCOSE 96 04/26/2020 0210   BUN 16 04/26/2020 0210   CREATININE 0.77 04/26/2020 0210   CALCIUM 8.3 (L) 04/26/2020 0210   GFRNONAA >60 04/26/2020 0210    COAG Lab Results  Component Value Date   INR 1.6 (H) 04/26/2020   INR 1.9 (H) 04/25/2020   INR 2.2 (H) 04/24/2020   PROTIME 39.6 (H) 03/19/2007   No results found for: PTT  Antibiotics Anti-infectives (From admission, onward)   Start     Dose/Rate Route Frequency Ordered Stop   04/27/20 0600  ceFAZolin (ANCEF) IVPB 2g/100 mL premix       Note to Pharmacy: Send with pt to OR   2 g 200 mL/hr over 30 Minutes Intravenous On call 04/26/20 0805 04/28/20 0600   04/27/20 0000  ceFAZolin (ANCEF) IVPB  1 g/50 mL premix  Status:  Discontinued       Note to Pharmacy: Send with pt to OR   1 g 100 mL/hr over 30 Minutes Intravenous On call 04/26/20 0800 04/26/20 0805       V. Leia Alf, M.D., [facility] [place] Office: [phone] Pager:  [phone]

## 2020-04-26 NOTE — Progress Notes (Signed)
ANTICOAGULATION CONSULT NOTE - Initial Consult  Pharmacy Consult for enoxaparin Indication: on PTA warfarin for hx of DVT/PE - now being held for TCAR planned for Wednesday, December 22nd. Plan to bridge with Lovenox.   Allergies  Allergen Reactions  . Hctz [Hydrochlorothiazide] Other (See Comments)    dizzy  . Hydrocodone Itching  . Vicodin [Hydrocodone-Acetaminophen] Itching    Patient Measurements: Height: 6\' 2"  (188 cm) Weight: 79.7 kg (175 lb 11.3 oz) IBW/kg (Calculated) : 82.2  Vital Signs: Temp: 98 F (36.7 C) (12/21 0331) Temp Source: Oral (12/21 0331) BP: 128/69 (12/21 0331) Pulse Rate: 71 (12/21 0331)  Labs: Recent Labs    04/24/20 0105 04/25/20 0237 04/26/20 0210  HGB 15.2 15.2 15.3  HCT 46.0 46.0 45.0  PLT 223 211 216  LABPROT 24.0* 21.4* 18.7*  INR 2.2* 1.9* 1.6*  CREATININE  --   --  0.77    Estimated Creatinine Clearance: 81.6 mL/min (by C-G formula based on SCr of 0.77 mg/dL).   Medical History: Past Medical History:  Diagnosis Date  . Blood clotting disorder ([place])   . Blood dyscrasia   . Cancer ([place])    melanoma removed from right calf  . Hypertension   . Neuromuscular disorder ([place])    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    Medications:  Scheduled:  . aspirin EC  81 mg Oral Daily  . carvedilol  25 mg Oral BID WC  . clopidogrel  75 mg Oral Daily  . enoxaparin (LOVENOX) injection  1 mg/kg (Adjusted) Subcutaneous Q12H  . pravastatin  40 mg Oral Daily    Assessment: 79 yo male with L symptomatic carotid stenosis.  Patient and family in agreement to proceed with left sided TCAR scheduled for Wednesday, December 22nd.  Patient started on aspirin and clopidogrel per primary team at request of [facility].  Warfarin was being managed by physician and has been discontinued for Lovenox bridge.  INR today is 1.6.    PTA warfarin dose: 2.5mg  daily except 1.25mg  on Mondays  Goal of Therapy:  Will not plan to monitor anti-Xa levels at this time Monitor  platelets by anticoagulation protocol: Yes   Plan:  Lovenox 1mg /kg subcutaneously q12hours --> 80 mg/kg q12hours Monitor daily CBC and INR F/u oral AC plans post surgery  Dymir Neeson A. Levada Dy, PharmD, BCPS, FNKF Clinical Pharmacist [place] Please utilize Amion for appropriate phone number to reach the unit pharmacist ([place])   04/26/2020 7:47 AM

## 2020-04-26 NOTE — Care Management Important Message (Signed)
Important Message  Patient Details  Name: John Lambert MRN: [id number] Date of Birth: 1941/04/22   Medicare Important Message Given:  Yes     [name] [address]ine Circle 04/26/2020, 3:10 PM

## 2020-04-26 NOTE — Progress Notes (Signed)
PROGRESS NOTE    John Lambert  [id number] DOB: 1941/04/03 DOA: 04/19/2020 PCP: Harlan Stains, MD     Brief Narrative:  This 79 y.o. male with PMH significant for hypertension, melanoma, neurofibroma, clotting disorder, history of DVT and PE on chronic Coumadin presented to the ED on 12/14 with c/o : bilateral lower extremity weakness and difficulty ambulating associated with pain in the left hip region. He has a history of neurofibroma tumor removal from his spine about 4 years ago and melanoma removal from right calf about 30 years ago. MRI brain obtained showed   Numerous scattered punctate acute infarctions in the left posterior frontal region consistent with micro embolic infarctions which could either be in the anterior or middle cerebral artery territory. Punctate acute infarction also affecting the splenium ofthe corpus callosum. Extensive chronic small-vessel ischemic changes elsewherethroughout the brain. Small old left parietal vertex cortical and subcorticalInfarctions.  MRI lumbar spine showed Chronic arachnoiditis with adhesions in the thecal sac and distorted cauda equina nerve roots from L2-L3 to the sacrum. No recurrent spinal tumor is evident. Superimposed lumbar spine degeneration primarily at L4-L5 and L5-S1. L4-L5 multifactorial mild to moderate multifactorial spinal stenosis, mild right L4 neural foraminal stenosis. L5-S1 moderate right lateral recess and foraminal stenosis. Query Right L5 and/or S1 radiculitis.  Vascular surgery consulted for symptomatic left carotid stenosis, patient is scheduled to have left-sided TCAR on 12/22.   Assessment & Plan:   Principal Problem:   Weakness Active Problems:   Anticoagulated on warfarin   Impaired ambulation   Fall at home, initial encounter   Essential hypertension   Blood clotting disorder ([place])   Cerebral embolism with cerebral infarction   Stroke [facility]   Acute CVA: -Patient presented with weakness of lower  extremities for 24 hours, impaired ambulation. -MRI brain shows numerous punctate acute infarctions. -Hb A1c 5.3, LDL 84. -Seen by neurology. Seen by PT/OT. CIR recommended. -Prior to admission, patient was on Coumadin and Crestor. -Continue Crestor, Coumadin transitioned to Lovenox.  Left carotid artery stenosis -Carotid duplex and CT angio of neck showed significant stenosis of the left carotid artery. -Vascular surgery consult appreciated.  Patient is tentatively scheduled for carotid artery stenting tentatively on Wednesday 12/22. -Prior to admission patient was on Coumadin.  As per recommendation by vascular surgery, Coumadin has been switched to full dose Lovenox.  He has been also started on dual antiplatelet coverage with aspirin and Plavix.  Anticoagulation post procedure to be decided by vascular surgery.  Hypertension:  -Home meds include Coreg and lisinopril. Continue Coreg.  Lisinopril remains on hold. -Continue to monitor.  Blood clotting disorder, history of DVT/PE -On Coumadin at home.  -Ultrasound duplex of lower extremities showed age-indeterminate, probably chronic DVT without symptoms. -Patient is currently on full dose Lovenox  Left thigh pain -Shooting pain, intermittent.  Continue Flexeril nightly as needed.   DVT prophylaxis: Lovenox Code Status: Full code Family Communication: Wife and daughter at bedside. Disposition Plan:   Status is: Inpatient  Remains inpatient appropriate because:Inpatient level of care appropriate due to severity of illness   Dispo: The patient is from: Home              Anticipated d/c is to: CIR              Anticipated d/c date is: > 3 days              Patient currently is not medically stable to d/c.   Consultants:   Vascular surgery.  Procedures:  Antimicrobials: Anti-infectives (From admission, onward)   Start     Dose/Rate Route Frequency Ordered Stop   04/27/20 0600  ceFAZolin (ANCEF) IVPB 2g/100 mL premix        Note to Pharmacy: Send with pt to OR   2 g 200 mL/hr over 30 Minutes Intravenous On call 04/26/20 0805 04/28/20 0600   04/27/20 0000  ceFAZolin (ANCEF) IVPB 1 g/50 mL premix  Status:  Discontinued       Note to Pharmacy: Send with pt to OR   1 g 100 mL/hr over 30 Minutes Intravenous On call 04/26/20 0800 04/26/20 0805      Subjective: Patient was seen and examined at bedside. Overnight events noted.  He appears tired,  states he could not slept well last night. Patient is anticipating carotid artery stenting on Wed 12/22.  Objective: Vitals:   04/25/20 2316 04/26/20 0331 04/26/20 0800 04/26/20 1228  BP: (!) 125/56 128/69 138/75 132/77  Pulse: 73 71 77 66  Resp: 19 18  16   Temp: 97.9 F (36.6 C) 98 F (36.7 C) 97.9 F (36.6 C) 97.8 F (36.6 C)  TempSrc: Oral Oral Oral   SpO2: 95% 94% 95% 98%  Weight:      Height:        Intake/Output Summary (Last 24 hours) at 04/26/2020 1309 Last data filed at 04/26/2020 1856 Gross per 24 hour  Intake --  Output 325 ml  Net -325 ml   Filed Weights   04/19/20 0043 04/20/20 0037 04/20/20 2034  Weight: 77.1 kg 75.8 kg 79.7 kg    Examination:  General exam: Appears calm and comfortable, not in any distress Respiratory system: Clear to auscultation. Respiratory effort normal. Cardiovascular system: S1 & S2 heard, RRR. No JVD, murmurs, rubs, gallops or clicks. No pedal edema. Gastrointestinal system: Abdomen is nondistended, soft and nontender. No organomegaly or masses felt. Normal bowel sounds heard. Central nervous system: Alert and oriented. No focal neurological deficits. Extremities: No edema, no cyanosis, no clubbing. Skin: No rashes, lesions or ulcers Psychiatry: Judgement and insight appear normal. Mood & affect appropriate.     Data Reviewed: I have personally reviewed following labs and imaging studies  CBC: Recent Labs  Lab 04/20/20 0216 04/21/20 0423 04/24/20 0105 04/25/20 0237 04/26/20 0210  WBC 9.9 8.0  7.0 6.5 7.2  NEUTROABS  --  5.7  --   --   --   HGB 15.7 15.9 15.2 15.2 15.3  HCT 48.5 46.5 46.0 46.0 45.0  MCV 91.3 89.3 90.0 90.6 89.5  PLT 195 216 223 211 314   Basic Metabolic Panel: Recent Labs  Lab 04/20/20 0216 04/21/20 0423 04/26/20 0210  NA 141 140 138  K 4.6 4.3 3.8  CL 105 103 105  CO2 25 25 22   GLUCOSE 95 94 96  BUN 15 15 16   CREATININE 0.94 0.88 0.77  CALCIUM 9.0 8.9 8.3*  MG  --  1.9 1.9  PHOS  --  3.5 3.6   GFR: Estimated Creatinine Clearance: 81.6 mL/min (by C-G formula based on SCr of 0.77 mg/dL). Liver Function Tests: No results for input(s): AST, ALT, ALKPHOS, BILITOT, PROT, ALBUMIN in the last 168 hours. No results for input(s): LIPASE, AMYLASE in the last 168 hours. No results for input(s): AMMONIA in the last 168 hours. Coagulation Profile: Recent Labs  Lab 04/22/20 0432 04/23/20 0316 04/24/20 0105 04/25/20 0237 04/26/20 0210  INR 2.1* 2.0* 2.2* 1.9* 1.6*   Cardiac Enzymes: No results for input(s): CKTOTAL,  CKMB, CKMBINDEX, TROPONINI in the last 168 hours. BNP (last 3 results) No results for input(s): PROBNP in the last 8760 hours. HbA1C: No results for input(s): HGBA1C in the last 72 hours. CBG: No results for input(s): GLUCAP in the last 168 hours. Lipid Profile: No results for input(s): CHOL, HDL, LDLCALC, TRIG, CHOLHDL, LDLDIRECT in the last 72 hours. Thyroid Function Tests: No results for input(s): TSH, T4TOTAL, FREET4, T3FREE, THYROIDAB in the last 72 hours. Anemia Panel: No results for input(s): VITAMINB12, FOLATE, FERRITIN, TIBC, IRON, RETICCTPCT in the last 72 hours. Sepsis Labs: No results for input(s): PROCALCITON, LATICACIDVEN in the last 168 hours.  Recent Results (from the past 240 hour(s))  Resp Panel by RT-PCR (Flu A&B, Covid) Nasopharyngeal Swab     Status: None   Collection Time: 04/19/20  6:20 AM   Specimen: Nasopharyngeal Swab; Nasopharyngeal(NP) swabs in vial transport medium  Result Value Ref Range Status   SARS  Coronavirus 2 by RT PCR NEGATIVE NEGATIVE Final    Comment: (NOTE) SARS-CoV-2 target nucleic acids are NOT DETECTED.  The SARS-CoV-2 RNA is generally detectable in upper respiratory specimens during the acute phase of infection. The lowest concentration of SARS-CoV-2 viral copies this assay can detect is 138 copies/mL. A negative result does not preclude SARS-Cov-2 infection and should not be used as the sole basis for treatment or other patient management decisions. A negative result may occur with  improper specimen collection/handling, submission of specimen other than nasopharyngeal swab, presence of viral mutation(s) within the areas targeted by this assay, and inadequate number of viral copies(<138 copies/mL). A negative result must be combined with clinical observations, patient history, and epidemiological information. The expected result is Negative.  Fact Sheet for Patients:  EntrepreneurPulse.com.au  Fact Sheet for Healthcare Providers:  IncredibleEmployment.be  This test is no t yet approved or cleared by the [place] FDA and  has been authorized for detection and/or diagnosis of SARS-CoV-2 by FDA under an Emergency Use Authorization (EUA). This EUA will remain  in effect (meaning this test can be used) for the duration of the COVID-19 declaration under Section 564(b)(1) of the Act, 21 U.S.C.section 360bbb-3(b)(1), unless the authorization is terminated  or revoked sooner.       Influenza A by PCR NEGATIVE NEGATIVE Final   Influenza B by PCR NEGATIVE NEGATIVE Final    Comment: (NOTE) The Xpert Xpress SARS-CoV-2/FLU/RSV plus assay is intended as an aid in the diagnosis of influenza from Nasopharyngeal swab specimens and should not be used as a sole basis for treatment. Nasal washings and aspirates are unacceptable for Xpert Xpress SARS-CoV-2/FLU/RSV testing.  Fact Sheet for  Patients: EntrepreneurPulse.com.au  Fact Sheet for Healthcare Providers: IncredibleEmployment.be  This test is not yet approved or cleared by the [place] FDA and has been authorized for detection and/or diagnosis of SARS-CoV-2 by FDA under an Emergency Use Authorization (EUA). This EUA will remain in effect (meaning this test can be used) for the duration of the COVID-19 declaration under Section 564(b)(1) of the Act, 21 U.S.C. section 360bbb-3(b)(1), unless the authorization is terminated or revoked.  Performed at [facility], [address] [place], [place] [address]     Radiology Studies: No results found.  Scheduled Meds: . aspirin EC  81 mg Oral Daily  . carvedilol  25 mg Oral BID WC  . clopidogrel  75 mg Oral Daily  . enoxaparin (LOVENOX) injection  1 mg/kg (Adjusted) Subcutaneous Q12H  . pravastatin  40 mg Oral Daily  Continuous Infusions: . [START ON 04/27/2020]  ceFAZolin (ANCEF) IV       LOS: 5 days    Time spent: 25 mins    Shawna Clamp, MD [facility]   If 7PM-7AM, please contact night-coverage

## 2020-04-26 NOTE — Progress Notes (Signed)
OT Cancellation Note  Patient Details Name: John Lambert MRN: [id number] DOB: 04-May-1941   Cancelled Treatment:    Reason Eval/Treat Not Completed: Patient at procedure or test/ unavailable;Other (comment) Pt currently working with PT, will check back as time allows for OT session.  Lanier Clam., COTA/L Acute Rehabilitation Services [phone] [place] 04/26/2020, 2:51 PM

## 2020-04-27 ENCOUNTER — Inpatient Hospital Stay (HOSPITAL_COMMUNITY): Payer: Medicare Other | Admitting: Anesthesiology

## 2020-04-27 ENCOUNTER — Inpatient Hospital Stay (HOSPITAL_COMMUNITY): Payer: Medicare Other

## 2020-04-27 ENCOUNTER — Inpatient Hospital Stay (HOSPITAL_COMMUNITY): Payer: Medicare Other | Admitting: Registered Nurse

## 2020-04-27 ENCOUNTER — Encounter (HOSPITAL_COMMUNITY)
Admission: EM | Disposition: A | Discharge status: Skilled Nursing Facility | Payer: Self-pay | Source: Home / Self Care | Attending: Internal Medicine

## 2020-04-27 ENCOUNTER — Inpatient Hospital Stay (HOSPITAL_COMMUNITY): Admission: RE | Admit: 2020-04-27 | Payer: Medicare Other | Source: Home / Self Care | Admitting: Surgery

## 2020-04-27 ENCOUNTER — Encounter (HOSPITAL_COMMUNITY): Payer: Self-pay | Admitting: Internal Medicine

## 2020-04-27 DIAGNOSIS — Z20822 Contact with and (suspected) exposure to covid-19: Secondary | ICD-10-CM | POA: Diagnosis not present

## 2020-04-27 DIAGNOSIS — I6602 Occlusion and stenosis of left middle cerebral artery: Secondary | ICD-10-CM | POA: Diagnosis present

## 2020-04-27 DIAGNOSIS — Z006 Encounter for examination for normal comparison and control in clinical research program: Secondary | ICD-10-CM | POA: Diagnosis not present

## 2020-04-27 DIAGNOSIS — I6340 Cerebral infarction due to embolism of unspecified cerebral artery: Secondary | ICD-10-CM | POA: Diagnosis not present

## 2020-04-27 DIAGNOSIS — J9601 Acute respiratory failure with hypoxia: Secondary | ICD-10-CM | POA: Diagnosis not present

## 2020-04-27 HISTORY — PX: ULTRASOUND GUIDANCE FOR VASCULAR ACCESS: SHX6516

## 2020-04-27 HISTORY — PX: TRANSCAROTID ARTERY REVASCULARIZATIONÂ: SHX6778

## 2020-04-27 HISTORY — PX: RADIOLOGY WITH ANESTHESIA: SHX6223

## 2020-04-27 HISTORY — PX: IR ANGIO INTRA EXTRACRAN SEL COM CAROTID INNOMINATE UNI L MOD SED: IMG5358

## 2020-04-27 LAB — URINALYSIS, ROUTINE W REFLEX MICROSCOPIC
Bilirubin Urine: NEGATIVE
Glucose, UA: NEGATIVE mg/dL
Hgb urine dipstick: NEGATIVE
Ketones, ur: NEGATIVE mg/dL
Leukocytes,Ua: NEGATIVE
Nitrite: NEGATIVE
Protein, ur: NEGATIVE mg/dL
Specific Gravity, Urine: 1.013 (ref 1.005–1.030)
pH: 6 (ref 5.0–8.0)

## 2020-04-27 LAB — CBC
HCT: 46.9 % (ref 39.0–52.0)
Hemoglobin: 15.9 g/dL (ref 13.0–17.0)
MCH: 30.9 pg (ref 26.0–34.0)
MCHC: 33.9 g/dL (ref 30.0–36.0)
MCV: 91.1 fL (ref 80.0–100.0)
Platelets: 226 10*3/uL (ref 150–400)
RBC: 5.15 MIL/uL (ref 4.22–5.81)
RDW: 13.2 % (ref 11.5–15.5)
WBC: 6.8 10*3/uL (ref 4.0–10.5)
nRBC: 0 % (ref 0.0–0.2)

## 2020-04-27 LAB — MAGNESIUM: Magnesium: 2.1 mg/dL (ref 1.7–2.4)

## 2020-04-27 LAB — GLUCOSE, CAPILLARY
Glucose-Capillary: 114 mg/dL — ABNORMAL HIGH (ref 70–99)
Glucose-Capillary: 106 mg/dL — ABNORMAL HIGH (ref 70–99)
Glucose-Capillary: 97 mg/dL (ref 70–99)

## 2020-04-27 LAB — BASIC METABOLIC PANEL
Anion gap: 10 (ref 5–15)
BUN: 13 mg/dL (ref 8–23)
CO2: 26 mmol/L (ref 22–32)
Calcium: 8.7 mg/dL — ABNORMAL LOW (ref 8.9–10.3)
Chloride: 105 mmol/L (ref 98–111)
Creatinine, Ser: 0.91 mg/dL (ref 0.61–1.24)
GFR, Estimated: >60 mL/min (ref >60)
Glucose, Bld: 98 mg/dL (ref 70–99)
Potassium: 4 mmol/L (ref 3.5–5.1)
Sodium: 141 mmol/L (ref 135–145)

## 2020-04-27 LAB — POCT ACTIVATED CLOTTING TIME
Activated Clotting Time: 291 seconds
Activated Clotting Time: 148 seconds
Activated Clotting Time: 160 seconds

## 2020-04-27 LAB — MRSA PCR SCREENING: MRSA by PCR: NEGATIVE

## 2020-04-27 LAB — PROTIME-INR
INR: 1.3 — ABNORMAL HIGH (ref 0.8–1.2)
Prothrombin Time: 15.4 seconds — ABNORMAL HIGH (ref 11.4–15.2)

## 2020-04-27 LAB — PHOSPHORUS: Phosphorus: 3.1 mg/dL (ref 2.5–4.6)

## 2020-04-27 IMAGING — XA IR CARO CERE HEAD/NECK UNILAT LEFT (MS)
1 series · 13 of 24 positions shown · IV contrast (IODINE)
Comparison: CT angiogram of the head and neck [DATE],
and [DATE].

CLINICAL DATA: Onset of left-sided gaze deviation and right-sided
weakness arm greater than leg with speech difficulties post surgery.

EXAM:
IR ANGIO INTRA EXTRACRAN SEL COM CAROTID INNOMINATE UNI LEFT MOD SED
TECHNIQUE: Informed written consent was obtained from the patient after a
thorough discussion of the procedural risks, benefits and
alternatives. All questions were addressed. Maximal Sterile Barrier
Technique was utilized including caps, mask, sterile gowns, sterile
gloves, sterile drape, hand hygiene and skin antiseptic. A timeout
was performed prior to the initiation of the procedure.

[Series 300: dr. (person_name) · 13 of 110 slices shown]
[im 1/110]
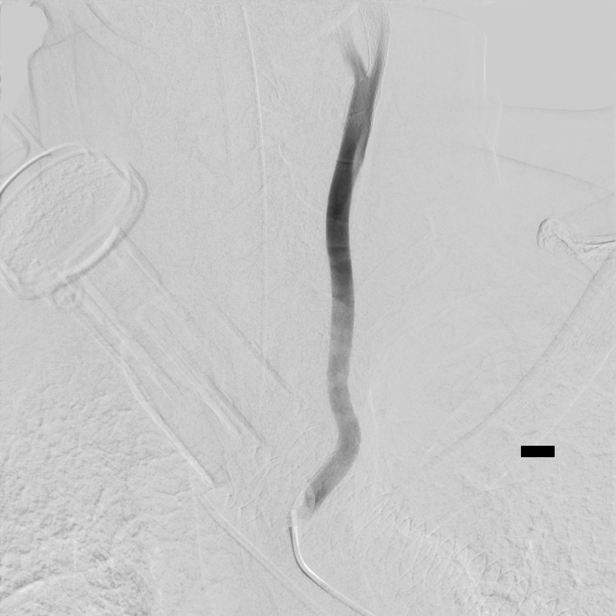
[im 10/110]
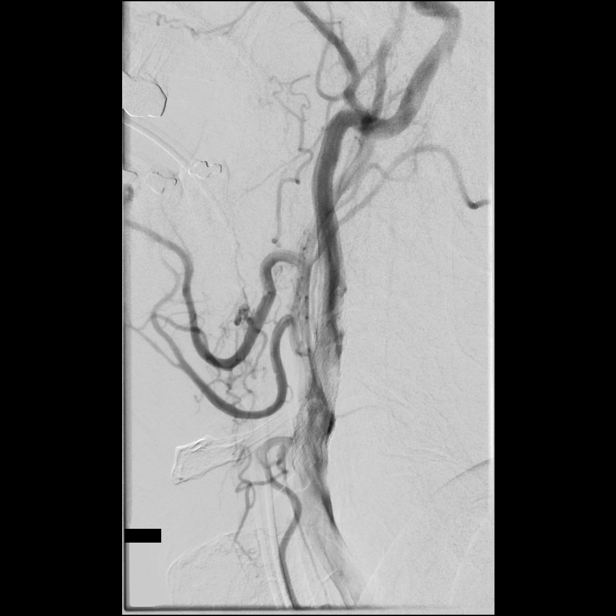
[im 19/110]
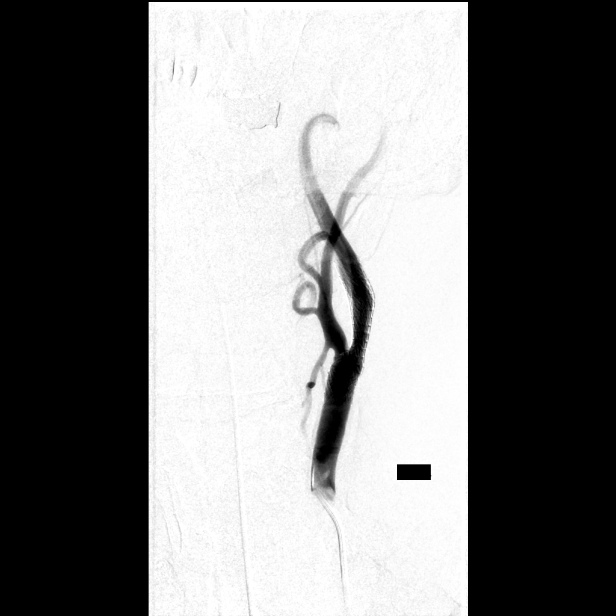
[im 29/110]
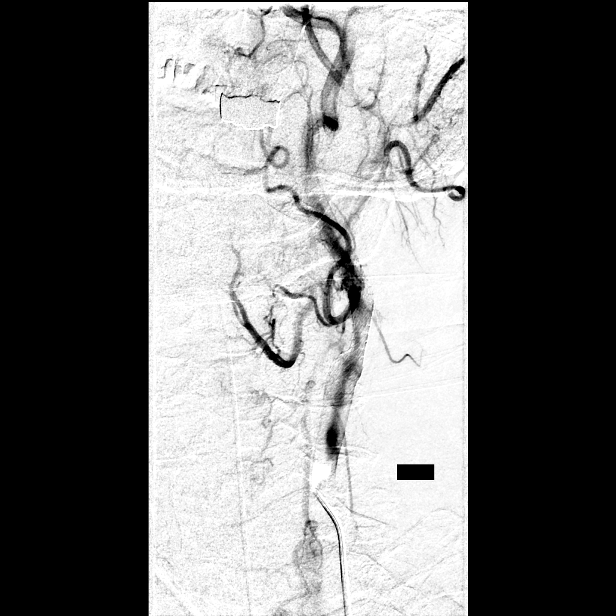
[im 38/110]
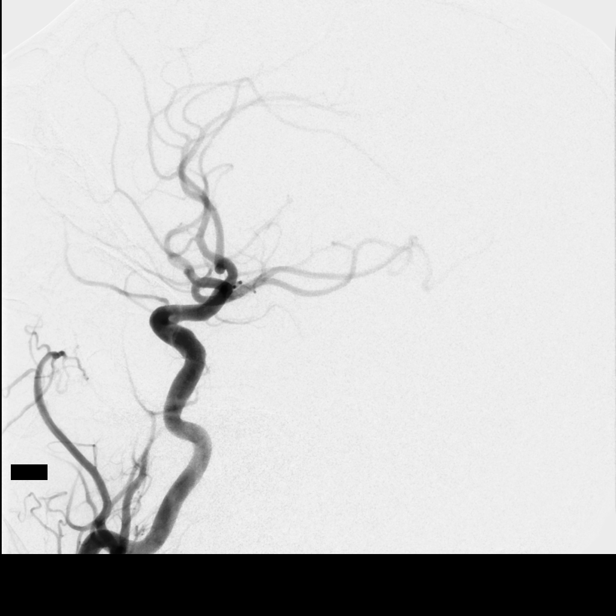
[im 48/110]
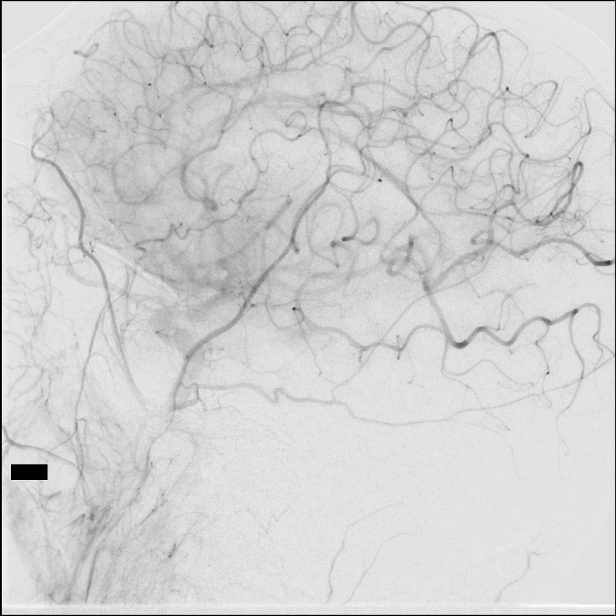
[im 57/110]
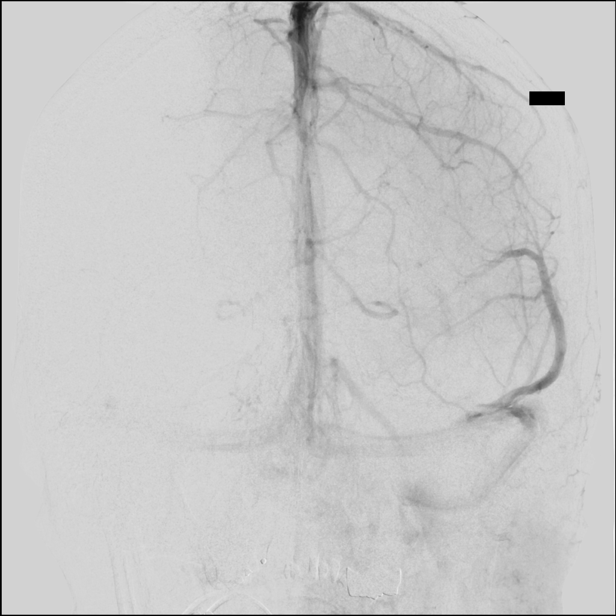
[im 62/110]
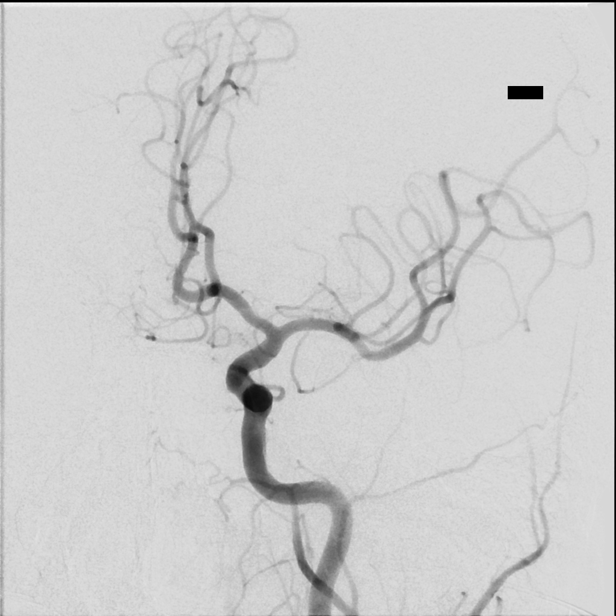
[im 72/110]
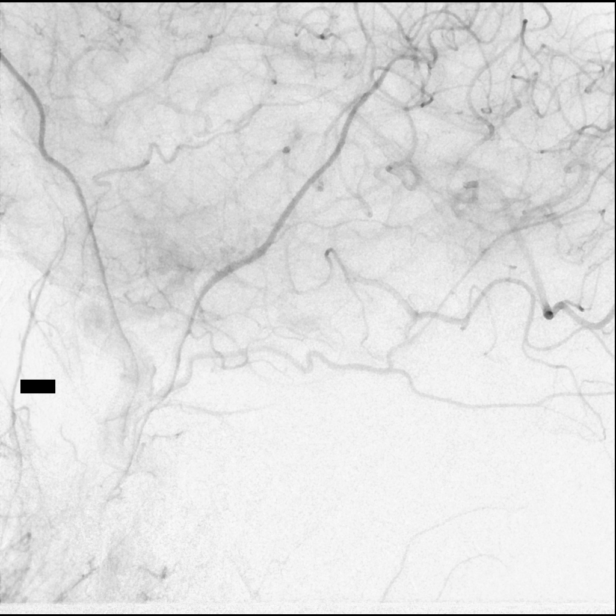
[im 81/110]
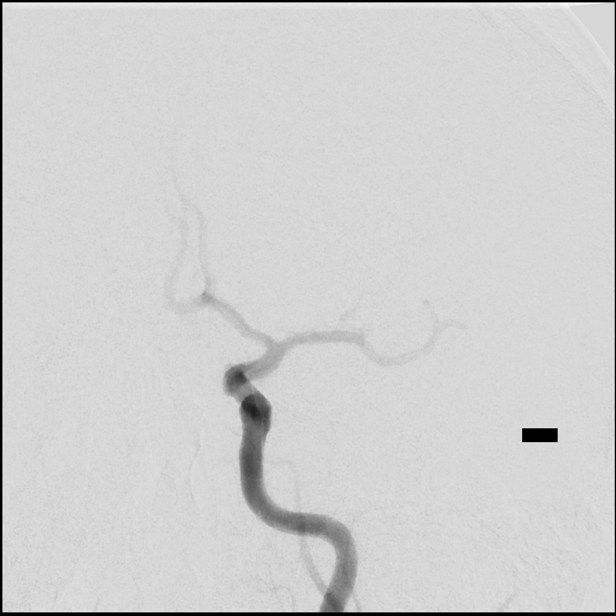
[im 91/110]
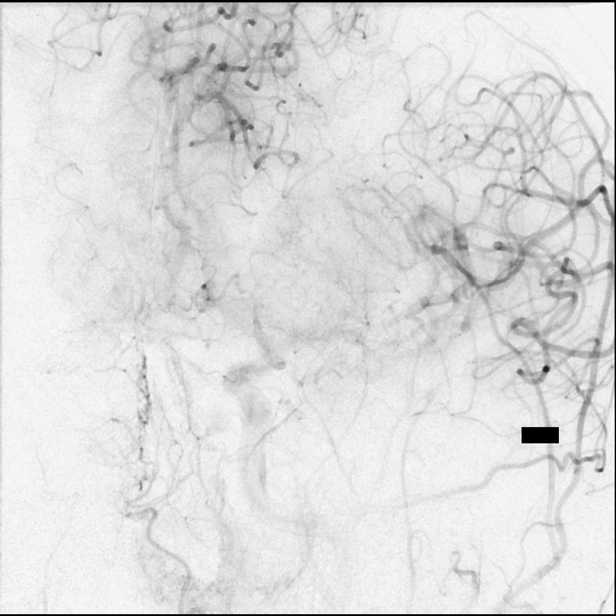
[im 100/110]
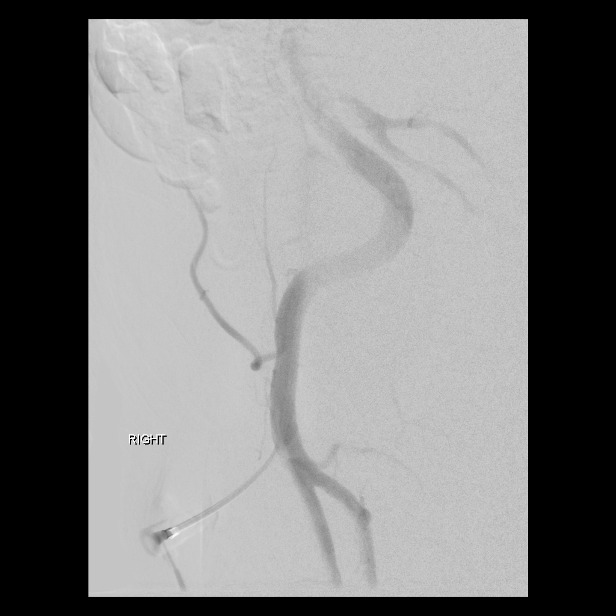
[im 110/110]
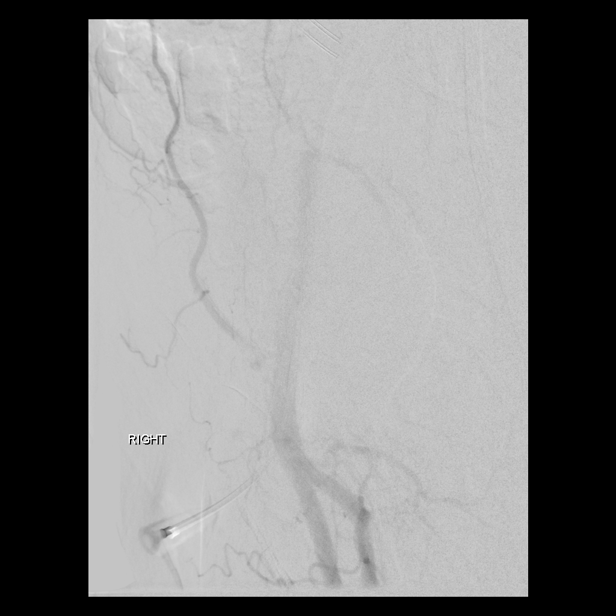

[13 of 24 positions shown; findings below may reference images not displayed]

MEDICATIONS:
Heparin [OP] units IV. No antibiotic was administered within 1 hour
of the procedure.

ANESTHESIA/SEDATION:
General anesthesia.

CONTRAST:  Isovue 300 approximately 45 mL

FLUOROSCOPY TIME:  Fluoroscopy Time: 4 minutes 18 seconds (427.1
mGy).

COMPLICATIONS:
None immediate.
The right groin was prepped and draped in the usual sterile fashion.
Thereafter using modified Seldinger technique, transfemoral access
into the right common femoral artery was obtained without
difficulty. Over a 0.035 inch guidewire, a 5 French Pinnacle sheath
was inserted. Through this, and also over 0.035 inch guidewire, a 5
French JB 1 catheter was advanced to the aortic arch region and
selectively positioned in left common carotid artery.
FINDINGS: .:
FINDINGS: .
The left common carotid arteriogram demonstrates the left external
carotid artery and its major branches to be widely patent.

The stent is seen positioned in the proximal left internal carotid
artery extending into the distal left common carotid artery.

The proximal portion of the stent at the level of the carotid
bifurcation demonstrates step-off anteriorly and posteriorly
suggestive of partial fracture. There is approximately 30% to 40%
narrowing in the proximal portion of the stented segment of the ICA
by the NASCET criteria, on the AP projection.

More distally the vessel is seen to opacify to the cranial skull
base. The petrous, the cavernous and the supraclinoid segments are
widely patent.

The left middle cerebral artery and the left anterior cerebral
artery opacify into the capillary and venous phases. No angiographic
evidence of intraluminal filling defects or of occlusions are seen
into the more tertiary branches of the left MCA distribution.

Also seen is prompt opacification via the anterior communicating
artery of the right anterior cerebral A2 segment and distally.
IMPRESSION: Approximately 30-40% stenosis within the stented segment of the left
ICA proximally on the AP projection.

Intracranially no evidence of intraluminal filling defects,
occlusions, stenosis or of dissections.

PLAN:
Patient was extubated successfully and returned to the PACU.

## 2020-04-27 IMAGING — CT CT HEAD CODE STROKE
3 series · 15 of 47 positions shown, 18 images · non-contrast
Comparison: None.

CLINICAL DATA: Code stroke.  Right-sided gaze.  Flaccid right-sided

EXAM:
CT HEAD WITHOUT CONTRAST
TECHNIQUE: Contiguous axial images were obtained from the base of the skull
through the vertex without intravenous contrast.

[Series 3: head 5.0 h30s · axial · 0.44mm/px · z∈[-120,+30]mm · 9 of 36 slices shown, 12 images]
[im 3/36  brain]
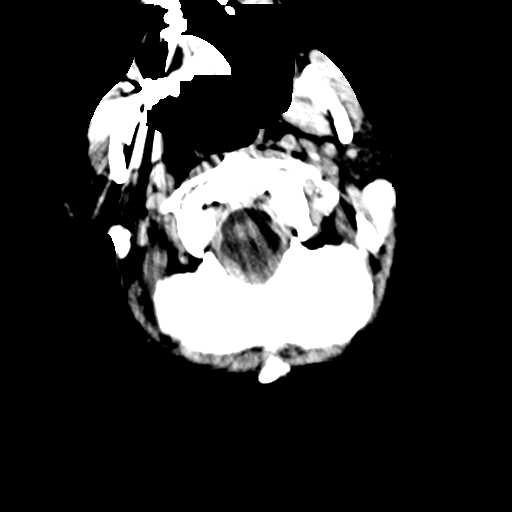
[im 3/36  bone]
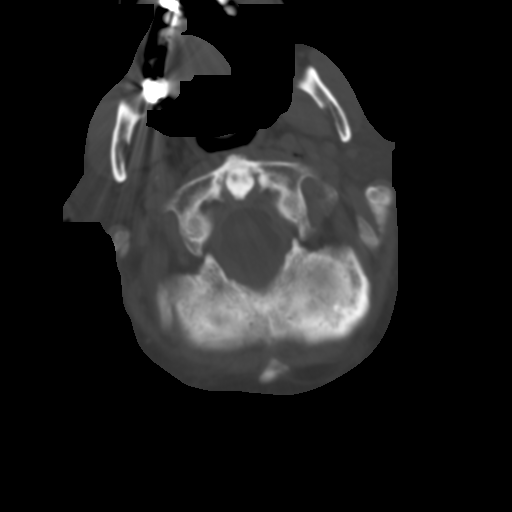
[im 7/36  brain]
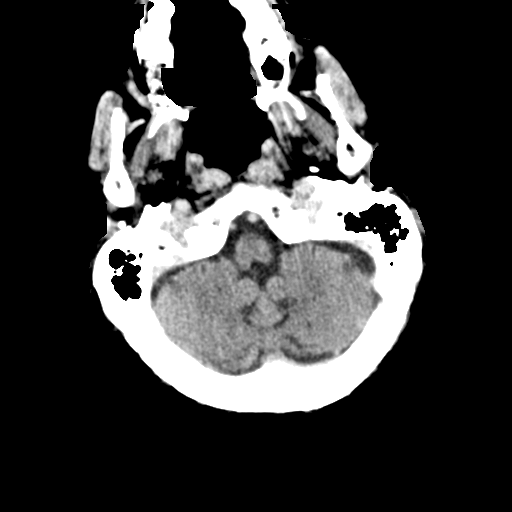
[im 10/36  brain]
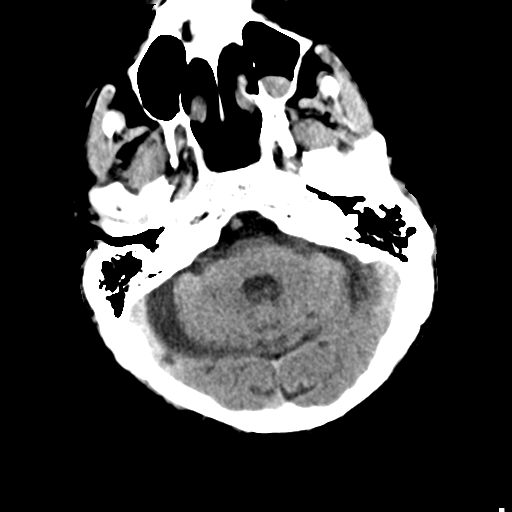
[im 14/36  brain]
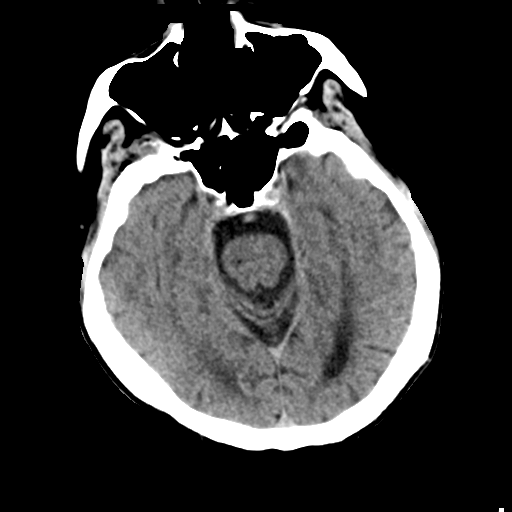
[im 19/36  brain]
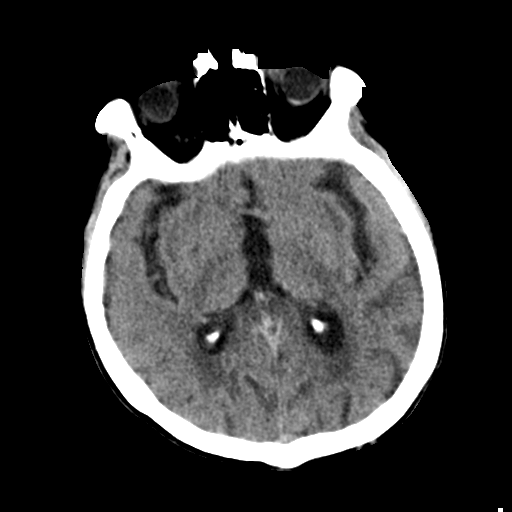
[im 19/36  bone]
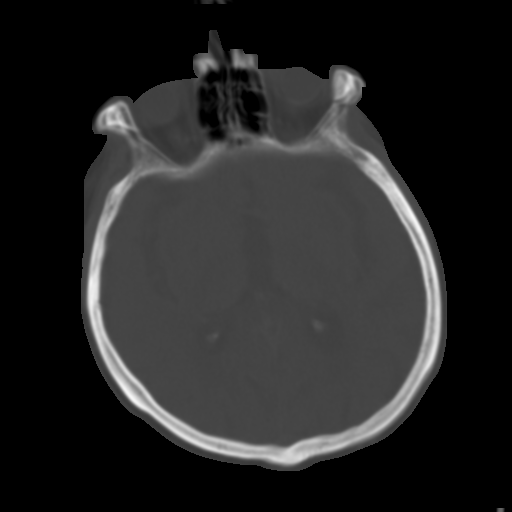
[im 22/36  brain]
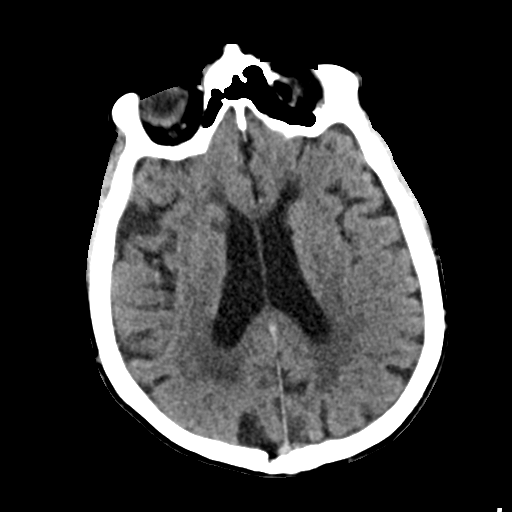
[im 26/36  brain]
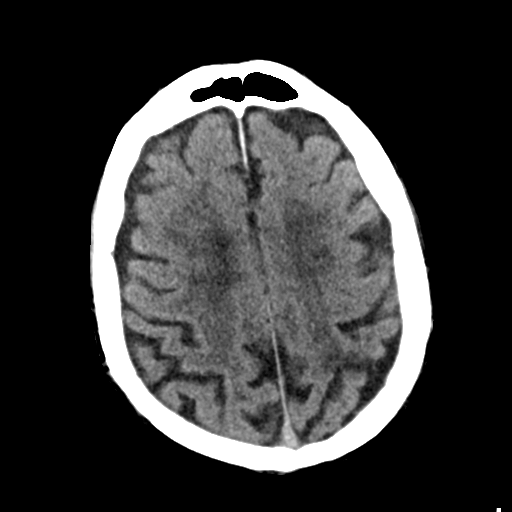
[im 29/36  brain]
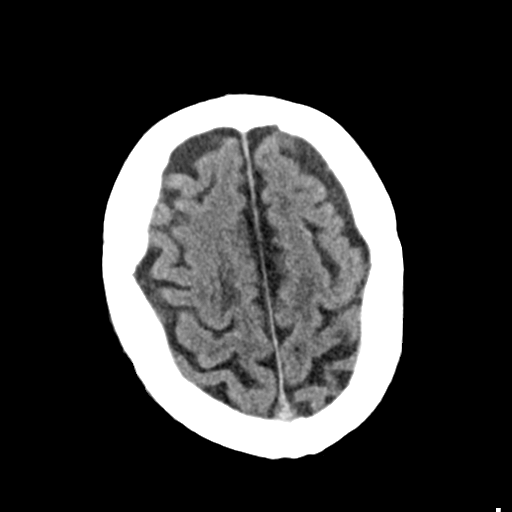
[im 33/36  brain]
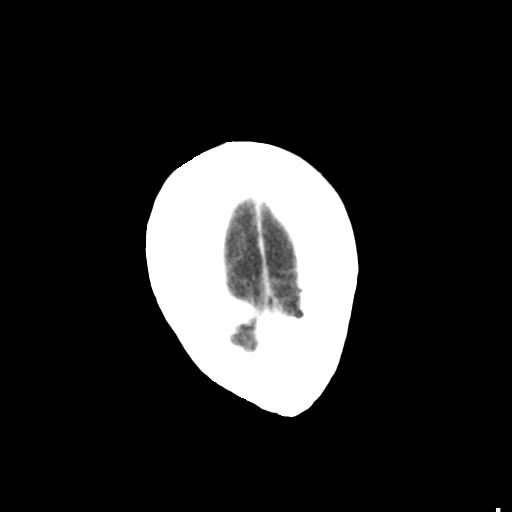
[im 33/36  bone]
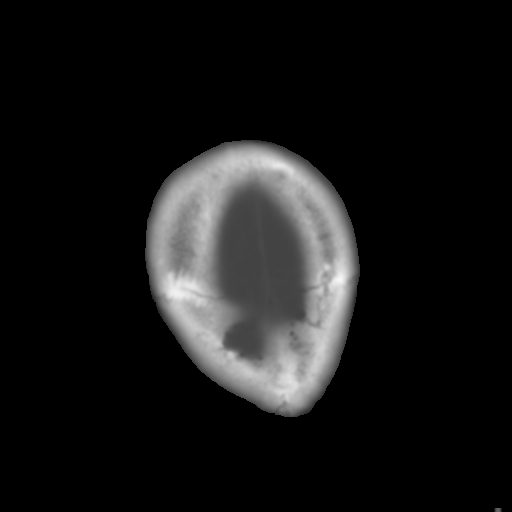

[Series 5: head 3.0 mpr cor · coronal · 0.40mm/px · 3 of 71 slices shown]
[im 24/71  brain]
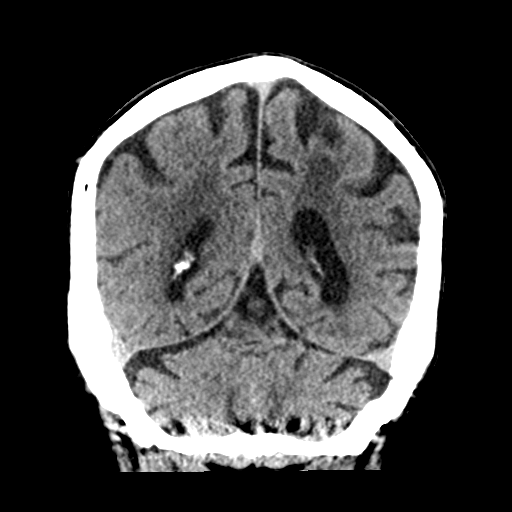
[im 32/71  brain]
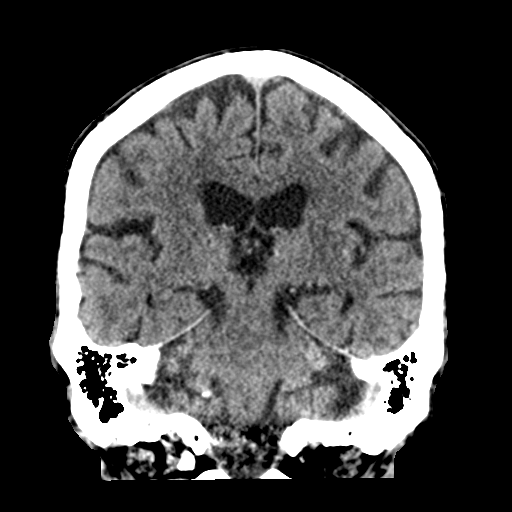
[im 39/71  brain]
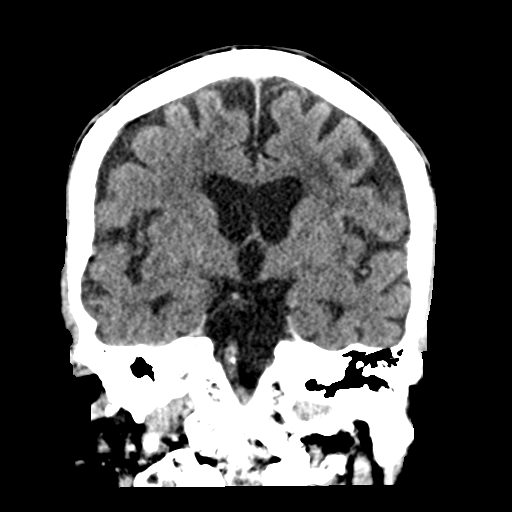

[Series 6: head 3.0 mpr sag · sagittal · 0.38mm/px · 3 of 56 slices shown]
[im 19/56  brain]
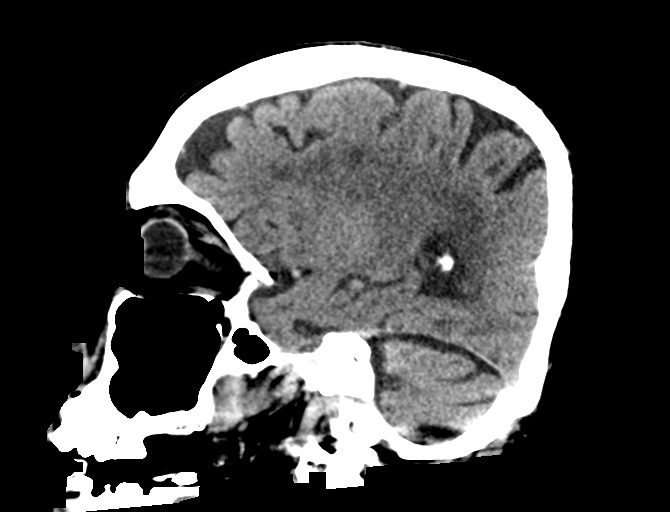
[im 28/56  brain]
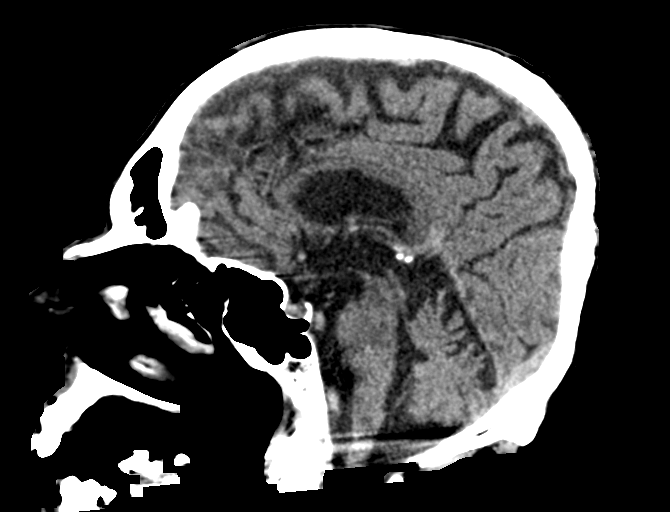
[im 37/56  brain]
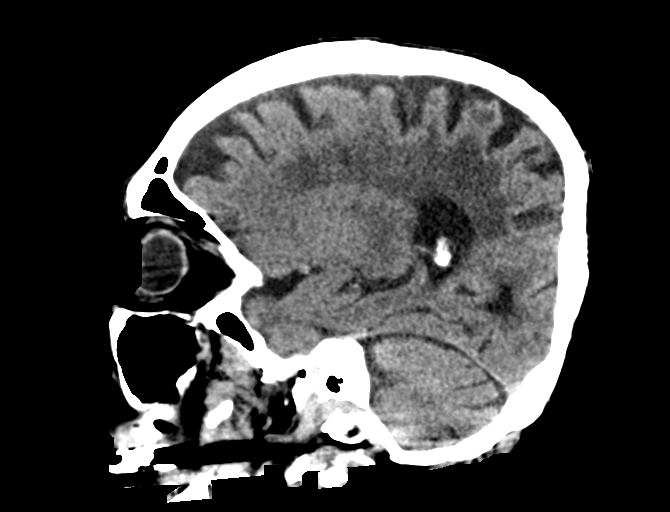

[15 of 47 positions shown; findings below may reference images not displayed]

FINDINGS: Brain: Small remote cortical infarcts in the posterior right frontal
and left frontal parietal region at the vertex. Subacute infarcts in
the left frontal lobe by prior brain MRI are in apparent on this
study. Chronic small vessel disease with chronic appearing lacune.
At the bilateral caudate nuclei. Confluent chronic small vessel
ischemia in the cerebral white matter. No hemorrhage, hydrocephalus,
or masslike finding. Cerebral atrophy.

Vascular: Prominent density of the supraclinoid left ICA on axial
slices, but not persisting on reformats. A CTA is already ordered.
Atherosclerosis

Skull: Normal. Negative for fracture or focal lesion.

Sinuses/Orbits: Retention cyst in the left maxillary sinus.

Other: These results were communicated to Dr. AYELET at [DATE] AYELET
[DATE]by text page via the [employer] messaging system.

ASPECTS (Alberta Stroke Program Early CT Score)

- Ganglionic level infarction (caudate, lentiform nuclei, internal
capsule, insula, M1-M3 cortex): 7

- Supraganglionic infarction (M4-M6 cortex): 3 (when accounting for
chronic infarction)

Total score (0-10 with 10 being normal): 10
IMPRESSION: 1. No hemorrhage or visible acute infarct.
2. Subacute infarcts in the left frontal lobe by recent brain MRI
are not visible today.
3. Small remote cortical infarcts.  Chronic small vessel disease.

## 2020-04-27 IMAGING — CT CT ANGIO HEAD
1 of 8 series · 14 of 47 positions shown · IV contrast (omnipaque)
Comparison: Head CT from earlier today. CTA of the head neck from 7
days ago

CLINICAL DATA: Flaccid right-side

EXAM:
CT ANGIOGRAPHY HEAD AND NECK
TECHNIQUE: Multidetector CT imaging of the head and neck was performed using
the standard protocol during bolus administration of intravenous
contrast. Multiplanar CT image reconstructions and MIPs were
obtained to evaluate the vascular anatomy. Carotid stenosis
measurements (when applicable) are obtained utilizing NASCET
criteria, using the distal internal carotid diameter as the
denominator.
CONTRAST:  75mL OMNIPAQUE IOHEXOL 350 MG/ML SOLN

[Series 12: thin · axial · 0.57mm/px · z∈[-307,+14]mm · 14 of 743 slices shown]
[im 50/743  brain]
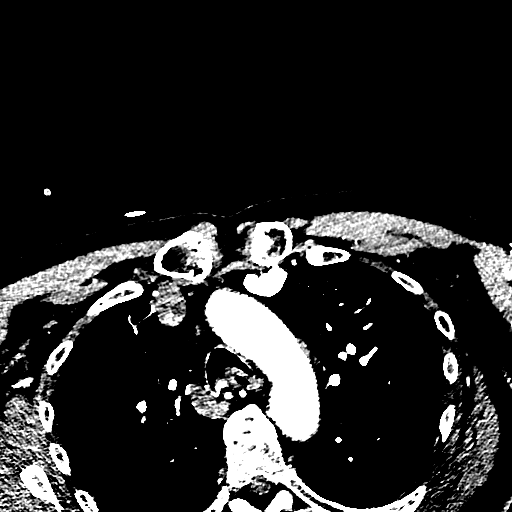
[im 99/743  bone]
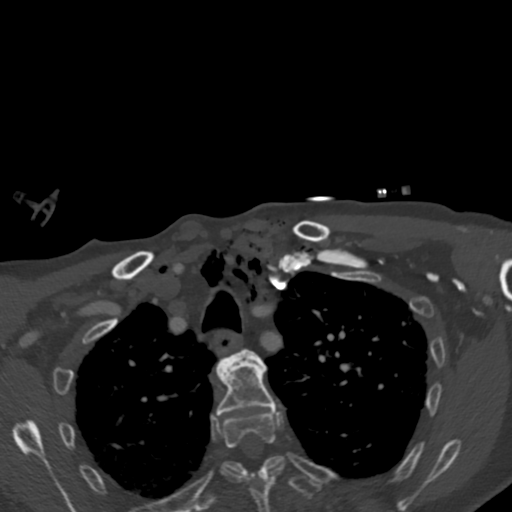
[im 149/743  brain]
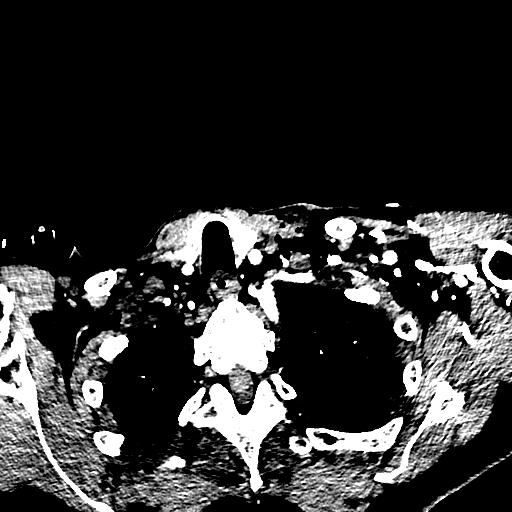
[im 198/743  bone]
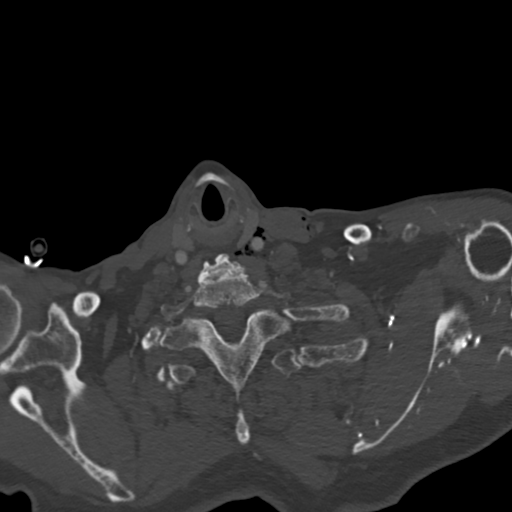
[im 248/743  brain]
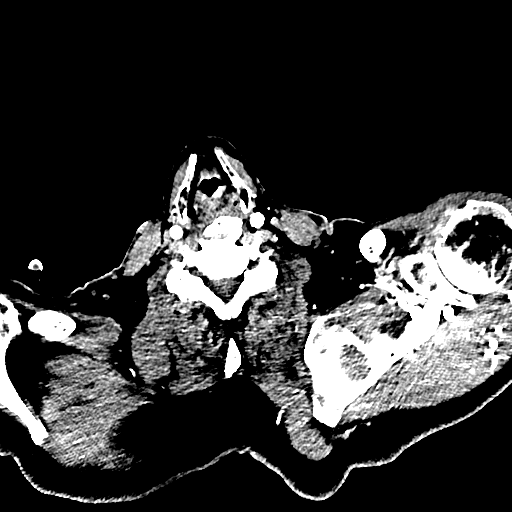
[im 297/743  bone]
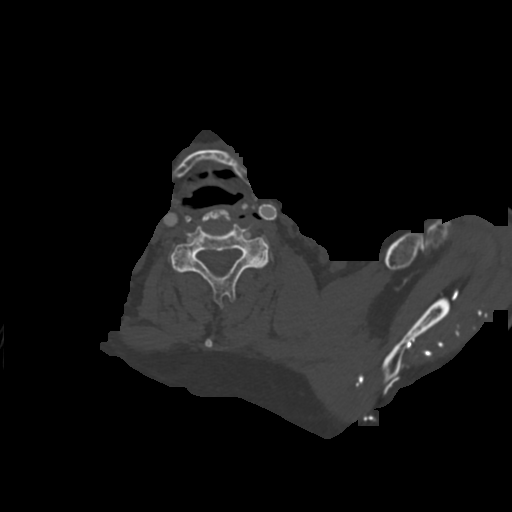
[im 347/743  brain]
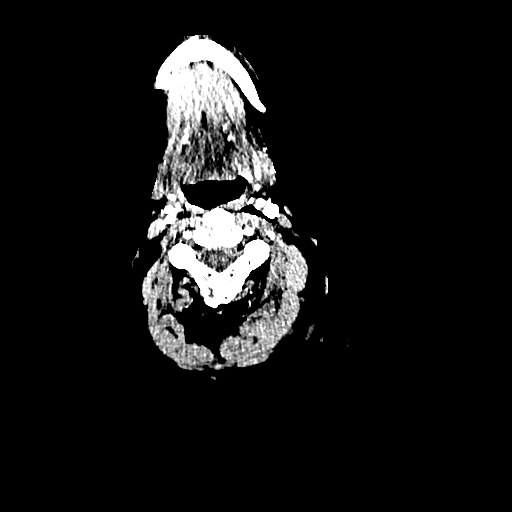
[im 396/743  bone]
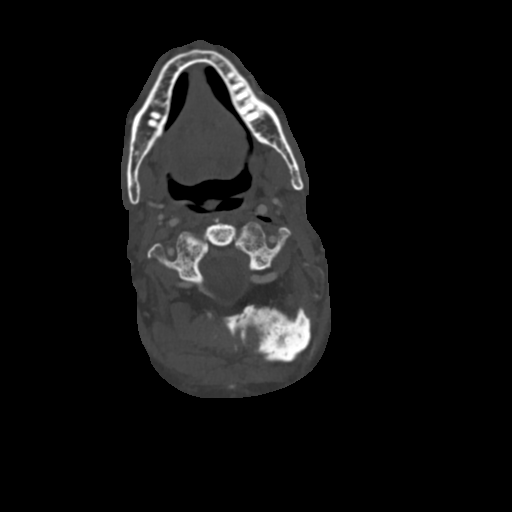
[im 446/743  brain]
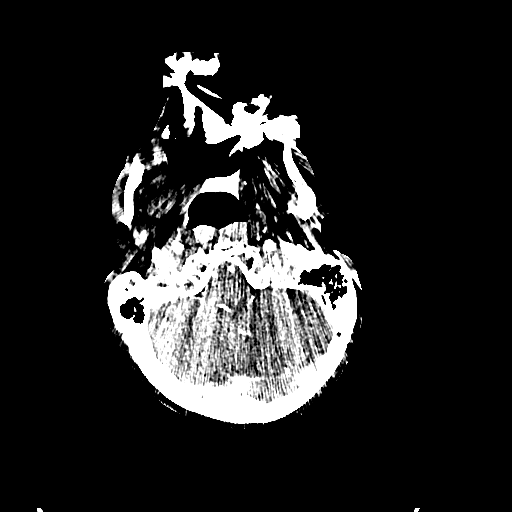
[im 495/743  bone]
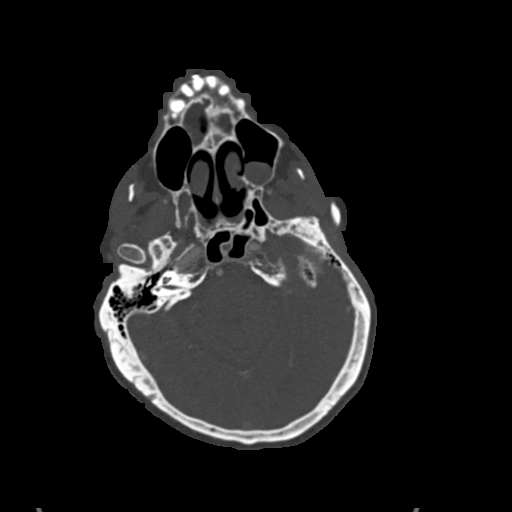
[im 545/743  brain]
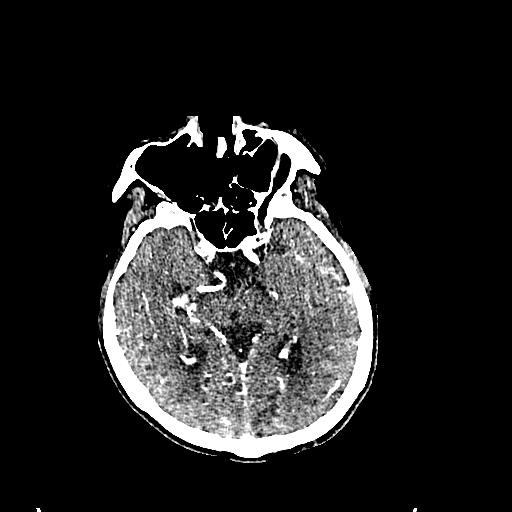
[im 594/743  bone]
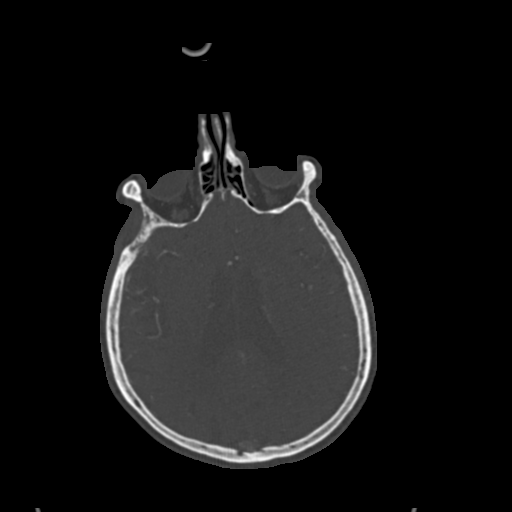
[im 644/743  brain]
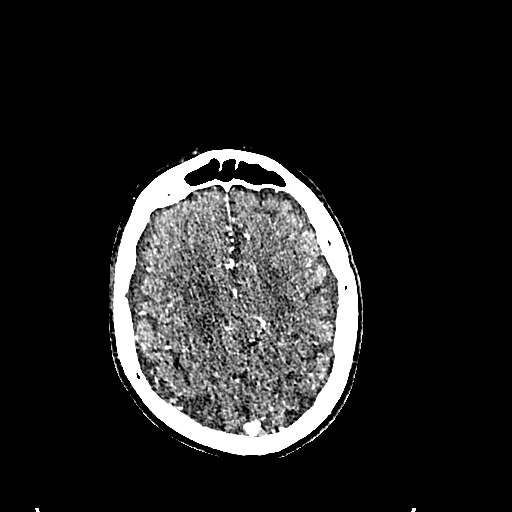
[im 693/743  bone]
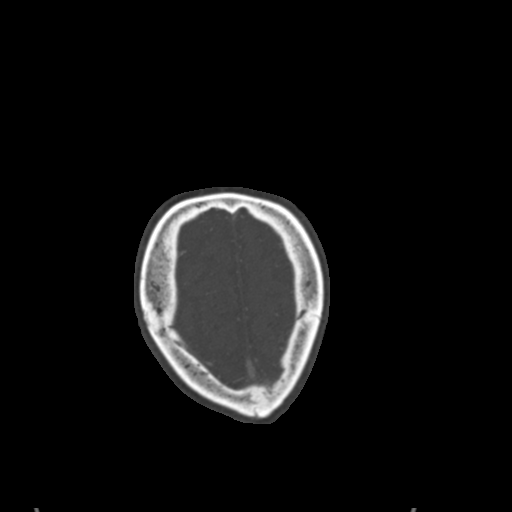

[14 of 47 positions shown; findings below may reference images not displayed]

FINDINGS: CTA NECK FINDINGS

Aortic arch: Atherosclerosis with 3 vessel branching.

Right carotid system: Limited atheromatous changes. No stenosis or
ulceration.

Left carotid system: Trans carotid revascularization with stent
extending from distal common to proximal internal carotid. There is
related adjacent soft tissue gas from the surgical access. Wasting
of the stent which measures 50% stenosis compared to the more distal
stent, per coronal reformats. No ulceration, dissection, or
thrombosis seen.

Vertebral arteries: No proximal subclavian stenosis. The vertebral
arteries are diffusely patent

Skeleton: Ordinary degenerative and spondylitic spurring.

Other neck: Soft tissue gas related to surgical access. There is
medial ization of the left vocal fold.

Upper chest: Mild reticulation in the upper lungs with stability
favoring scarring.

Review of the MIP images confirms the above findings

CTA HEAD FINDINGS

Anterior circulation: Carotid siphon atheromatous calcification.
Aplastic right A1 segment. No major branch occlusion, beading, or
aneurysm. Mild atheromatous irregularity to bilateral medium size
vessels.

Posterior circulation: Mild left vertebral artery dominance.
Generalized atheromatous irregularity of the vertebral and basilar
arteries with 40% stenosis of the left V4 segment as measured on
coronal reformats. Fetal type right PCA. No branch occlusion,
beading, or aneurysm

Venous sinuses: Unremarkable in the arterial phase

Anatomic variants: As above

Review of the MIP images confirms the above findings
IMPRESSION: 1. Left trans carotid revascularization. The stent is patent with no
visible embolic disease to the major intracranial vessels. There is
wasting of the stent at the level of treated plaque, measuring
approximately 50%.
2. Moderate left V4 segment stenosis.
3. Medial ization of the left vocal fold, please correlate for
clinical signs of paresis.

## 2020-04-27 SURGERY — IR WITH ANESTHESIA
Anesthesia: General

## 2020-04-27 SURGERY — TRANSCAROTID ARTERY REVASCULARIZATION (TCAR)
Anesthesia: General | Site: Neck | Laterality: Left

## 2020-04-27 MED ORDER — SUGAMMADEX SODIUM 200 MG/2ML IV SOLN
INTRAVENOUS | Status: DC | PRN
  Administered 2020-04-27 (×4): 100 mg via INTRAVENOUS

## 2020-04-27 MED ORDER — EPHEDRINE 5 MG/ML INJ
INTRAVENOUS | Status: AC
  Filled 2020-04-27: qty 10

## 2020-04-27 MED ORDER — FENTANYL CITRATE (PF) 100 MCG/2ML IJ SOLN
INTRAMUSCULAR | Status: AC
  Filled 2020-04-27: qty 2

## 2020-04-27 MED ORDER — LIDOCAINE 2% (20 MG/ML) 5 ML SYRINGE
INTRAMUSCULAR | Status: DC | PRN
  Administered 2020-04-27: 100 mg via INTRAVENOUS

## 2020-04-27 MED ORDER — SUGAMMADEX SODIUM 200 MG/2ML IV SOLN
INTRAVENOUS | Status: DC | PRN
  Administered 2020-04-27: 200 mg via INTRAVENOUS

## 2020-04-27 MED ORDER — ONDANSETRON HCL 4 MG/2ML IJ SOLN
INTRAMUSCULAR | Status: AC
  Filled 2020-04-27: qty 2

## 2020-04-27 MED ORDER — ACETAMINOPHEN 160 MG/5ML PO SOLN
650.0000 mg | ORAL | Status: DC | PRN
  Administered 2020-05-05 – 2020-05-20 (×9): 650 mg
  Filled 2020-04-27 (×9): qty 20.3

## 2020-04-27 MED ORDER — FENTANYL CITRATE (PF) 250 MCG/5ML IJ SOLN
INTRAMUSCULAR | Status: AC
  Filled 2020-04-27: qty 5

## 2020-04-27 MED ORDER — EPTIFIBATIDE 20 MG/10ML IV SOLN
INTRAVENOUS | Status: AC
  Filled 2020-04-27: qty 10

## 2020-04-27 MED ORDER — ONDANSETRON HCL 4 MG/2ML IJ SOLN
INTRAMUSCULAR | Status: DC | PRN
  Administered 2020-04-27: 4 mg via INTRAVENOUS

## 2020-04-27 MED ORDER — REMIFENTANIL HCL 1 MG IV SOLR
0.0125 ug/kg/min | INTRAVENOUS | Status: AC
Start: 2020-04-27 — End: 2020-04-27
  Administered 2020-04-27: 08:00:00 .25 ug/kg/min via INTRAVENOUS
  Filled 2020-04-27 (×2): qty 2000

## 2020-04-27 MED ORDER — ACETAMINOPHEN 650 MG RE SUPP: 650.0000 mg | RECTAL | Status: DC | PRN

## 2020-04-27 MED ORDER — HEPARIN SODIUM (PORCINE) 1000 UNIT/ML IJ SOLN
INTRAMUSCULAR | Status: DC | PRN
  Administered 2020-04-27: 8000 [IU] via INTRAVENOUS

## 2020-04-27 MED ORDER — ONDANSETRON HCL 4 MG/2ML IJ SOLN: 4.0000 mg | Freq: Once | INTRAMUSCULAR | Status: DC | PRN

## 2020-04-27 MED ORDER — LACTATED RINGERS IV SOLN: INTRAVENOUS | Status: DC | PRN

## 2020-04-27 MED ORDER — IODIXANOL 320 MG/ML IV SOLN
INTRAVENOUS | Status: DC | PRN
  Administered 2020-04-27: 09:00:00 35 mL

## 2020-04-27 MED ORDER — HEPARIN SODIUM (PORCINE) 1000 UNIT/ML IJ SOLN
INTRAMUSCULAR | Status: DC | PRN
  Administered 2020-04-27: 1000 [IU] via INTRAVENOUS

## 2020-04-27 MED ORDER — ROCURONIUM BROMIDE 10 MG/ML (PF) SYRINGE
PREFILLED_SYRINGE | INTRAVENOUS | Status: DC | PRN
  Administered 2020-04-27 (×2): 50 mg via INTRAVENOUS

## 2020-04-27 MED ORDER — ROCURONIUM BROMIDE 10 MG/ML (PF) SYRINGE
PREFILLED_SYRINGE | INTRAVENOUS | Status: AC
  Filled 2020-04-27: qty 10

## 2020-04-27 MED ORDER — CLOPIDOGREL BISULFATE 300 MG PO TABS
ORAL_TABLET | ORAL | Status: AC
  Filled 2020-04-27: qty 1

## 2020-04-27 MED ORDER — GLYCOPYRROLATE PF 0.2 MG/ML IJ SOSY
PREFILLED_SYRINGE | INTRAMUSCULAR | Status: AC
  Filled 2020-04-27: qty 1

## 2020-04-27 MED ORDER — FENTANYL CITRATE (PF) 100 MCG/2ML IJ SOLN
25.0000 ug | INTRAMUSCULAR | Status: DC | PRN
Start: 2020-04-27 — End: 2020-04-27
  Administered 2020-04-27: 25 ug via INTRAVENOUS

## 2020-04-27 MED ORDER — TICAGRELOR 90 MG PO TABS
ORAL_TABLET | ORAL | Status: AC
  Filled 2020-04-27: qty 2

## 2020-04-27 MED ORDER — EPHEDRINE SULFATE-NACL 50-0.9 MG/10ML-% IV SOSY
PREFILLED_SYRINGE | INTRAVENOUS | Status: DC | PRN
  Administered 2020-04-27 (×3): 10 mg via INTRAVENOUS

## 2020-04-27 MED ORDER — PROPOFOL 10 MG/ML IV BOLUS
INTRAVENOUS | Status: DC | PRN
  Administered 2020-04-27: 100 mg via INTRAVENOUS

## 2020-04-27 MED ORDER — ROCURONIUM BROMIDE 10 MG/ML (PF) SYRINGE
PREFILLED_SYRINGE | INTRAVENOUS | Status: DC | PRN
  Administered 2020-04-27: 50 mg via INTRAVENOUS

## 2020-04-27 MED ORDER — HEPARIN SODIUM (PORCINE) 1000 UNIT/ML IJ SOLN
INTRAMUSCULAR | Status: AC
  Filled 2020-04-27: qty 1

## 2020-04-27 MED ORDER — IOHEXOL 350 MG/ML SOLN
75.0000 mL | Freq: Once | INTRAVENOUS | Status: AC | PRN
  Administered 2020-04-27: 75 mL via INTRAVENOUS

## 2020-04-27 MED ORDER — LIDOCAINE HCL (PF) 1 % IJ SOLN
INTRAMUSCULAR | Status: AC
  Filled 2020-04-27: qty 30

## 2020-04-27 MED ORDER — PROPOFOL 10 MG/ML IV BOLUS
INTRAVENOUS | Status: AC
  Filled 2020-04-27: qty 20

## 2020-04-27 MED ORDER — ASPIRIN 81 MG PO CHEW
CHEWABLE_TABLET | ORAL | Status: AC
  Filled 2020-04-27: qty 1

## 2020-04-27 MED ORDER — PROTAMINE SULFATE 10 MG/ML IV SOLN
INTRAVENOUS | Status: DC | PRN
  Administered 2020-04-27: 50 mg via INTRAVENOUS

## 2020-04-27 MED ORDER — SODIUM CHLORIDE 0.9 % IV SOLN
INTRAVENOUS | Status: AC
  Filled 2020-04-27: qty 1.2

## 2020-04-27 MED ORDER — SODIUM CHLORIDE 0.9 % IV SOLN
INTRAVENOUS | Status: DC | PRN
  Administered 2020-04-27: 09:00:00 500 mL

## 2020-04-27 MED ORDER — IOHEXOL 300 MG/ML  SOLN
150.0000 mL | Freq: Once | INTRAMUSCULAR | Status: AC | PRN
  Administered 2020-04-27: 12:00:00 45 mL

## 2020-04-27 MED ORDER — PROTAMINE SULFATE 10 MG/ML IV SOLN
INTRAVENOUS | Status: AC
  Filled 2020-04-27: qty 5

## 2020-04-27 MED ORDER — DEXAMETHASONE SODIUM PHOSPHATE 10 MG/ML IJ SOLN
INTRAMUSCULAR | Status: DC | PRN
  Administered 2020-04-27: 5 mg via INTRAVENOUS

## 2020-04-27 MED ORDER — CANGRELOR TETRASODIUM 50 MG IV SOLR
INTRAVENOUS | Status: AC
  Filled 2020-04-27: qty 50

## 2020-04-27 MED ORDER — SODIUM CHLORIDE 0.9 % IV SOLN: INTRAVENOUS | Status: DC

## 2020-04-27 MED ORDER — CLEVIDIPINE BUTYRATE 0.5 MG/ML IV EMUL
0.0000 mg/h | INTRAVENOUS | Status: DC
  Administered 2020-04-28: 11:00:00 2 mg/h via INTRAVENOUS
  Administered 2020-04-28: 05:00:00 1 mg/h via INTRAVENOUS
  Filled 2020-04-27 (×5): qty 50

## 2020-04-27 MED ORDER — CHLORHEXIDINE GLUCONATE CLOTH 2 % EX PADS
6.0000 | MEDICATED_PAD | Freq: Every day | CUTANEOUS | Status: DC
  Administered 2020-04-27 – 2020-05-25 (×28): 6 via TOPICAL

## 2020-04-27 MED ORDER — LIDOCAINE 2% (20 MG/ML) 5 ML SYRINGE
INTRAMUSCULAR | Status: AC
  Filled 2020-04-27: qty 5

## 2020-04-27 MED ORDER — VERAPAMIL HCL 2.5 MG/ML IV SOLN
INTRAVENOUS | Status: AC
  Filled 2020-04-27: qty 2

## 2020-04-27 MED ORDER — HEMOSTATIC AGENTS (NO CHARGE) OPTIME
TOPICAL | Status: DC | PRN
  Administered 2020-04-27: 1 via TOPICAL

## 2020-04-27 MED ORDER — PROPOFOL 10 MG/ML IV BOLUS
INTRAVENOUS | Status: DC | PRN
  Administered 2020-04-27: 120 mg via INTRAVENOUS

## 2020-04-27 MED ORDER — PHENYLEPHRINE 40 MCG/ML (10ML) SYRINGE FOR IV PUSH (FOR BLOOD PRESSURE SUPPORT)
PREFILLED_SYRINGE | INTRAVENOUS | Status: DC | PRN
  Administered 2020-04-27: 40 ug via INTRAVENOUS

## 2020-04-27 MED ORDER — ACETAMINOPHEN 325 MG PO TABS
650.0000 mg | ORAL_TABLET | ORAL | Status: DC | PRN
  Administered 2020-05-03 – 2020-05-16 (×7): 650 mg via ORAL
  Filled 2020-04-27 (×7): qty 2

## 2020-04-27 MED ORDER — DEXAMETHASONE SODIUM PHOSPHATE 10 MG/ML IJ SOLN
INTRAMUSCULAR | Status: AC
  Filled 2020-04-27: qty 1

## 2020-04-27 MED ORDER — TIROFIBAN HCL IN NACL 5-0.9 MG/100ML-% IV SOLN
INTRAVENOUS | Status: AC
  Filled 2020-04-27: qty 100

## 2020-04-27 MED ORDER — FENTANYL CITRATE (PF) 250 MCG/5ML IJ SOLN
INTRAMUSCULAR | Status: DC | PRN
  Administered 2020-04-27: 50 ug via INTRAVENOUS

## 2020-04-27 MED ORDER — CEFAZOLIN SODIUM 1 G IJ SOLR
INTRAMUSCULAR | Status: AC
  Filled 2020-04-27: qty 20

## 2020-04-27 MED ORDER — 0.9 % SODIUM CHLORIDE (POUR BTL) OPTIME
TOPICAL | Status: DC | PRN
  Administered 2020-04-27: 09:00:00 1000 mL

## 2020-04-27 MED ORDER — GLYCOPYRROLATE 0.2 MG/ML IJ SOLN
INTRAMUSCULAR | Status: DC | PRN
  Administered 2020-04-27 (×2): .2 mg via INTRAVENOUS

## 2020-04-27 MED ORDER — PHENYLEPHRINE HCL-NACL 10-0.9 MG/250ML-% IV SOLN
INTRAVENOUS | Status: DC | PRN
  Administered 2020-04-27: 40 ug/min via INTRAVENOUS

## 2020-04-27 SURGICAL SUPPLY — 57 items
ADH SKN CLS APL DERMABOND .7 (GAUZE/BANDAGES/DRESSINGS) ×2
BAG BANDED W/RUBBER/TAPE 36X54 (MISCELLANEOUS) ×3 IMPLANT
BAG EQP BAND 135X91 W/RBR TAPE (MISCELLANEOUS) ×2
BALLN STERLING RX 6X30X80 (BALLOONS) ×3
BALLN STERLING RX 7X30X80 (BALLOONS) ×3
BALLOON STERLING RX 6X30X80 (BALLOONS) IMPLANT
BALLOON STERLING RX 7X30X80 (BALLOONS) IMPLANT
CANISTER SUCT 3000ML PPV (MISCELLANEOUS) ×3 IMPLANT
CATH SUCT 10FR WHISTLE TIP (CATHETERS) ×3 IMPLANT
CLIP VESOCCLUDE MED 6/CT (CLIP) ×3 IMPLANT
CLIP VESOCCLUDE SM WIDE 6/CT (CLIP) ×3 IMPLANT
COVER DOME SNAP 22 D (MISCELLANEOUS) ×3 IMPLANT
COVER PROBE W GEL 5X96 (DRAPES) ×3 IMPLANT
DERMABOND ADVANCED (GAUZE/BANDAGES/DRESSINGS) ×1
DERMABOND ADVANCED .7 DNX12 (GAUZE/BANDAGES/DRESSINGS) ×2 IMPLANT
DRAPE FEMORAL ANGIO 80X135IN (DRAPES) ×3 IMPLANT
ELECT REM PT RETURN 9FT ADLT (ELECTROSURGICAL) ×3
ELECTRODE REM PT RTRN 9FT ADLT (ELECTROSURGICAL) ×2 IMPLANT
GLOVE BIOGEL PI IND STRL 7.5 (GLOVE) ×2 IMPLANT
GLOVE BIOGEL PI INDICATOR 7.5 (GLOVE) ×1
GLOVE SURG SS PI 7.5 STRL IVOR (GLOVE) ×3 IMPLANT
GOWN STRL REUS W/ TWL LRG LVL3 (GOWN DISPOSABLE) ×4 IMPLANT
GOWN STRL REUS W/ TWL XL LVL3 (GOWN DISPOSABLE) ×2 IMPLANT
GOWN STRL REUS W/TWL LRG LVL3 (GOWN DISPOSABLE) ×6
GOWN STRL REUS W/TWL XL LVL3 (GOWN DISPOSABLE) ×3
GUIDEWIRE ENROUTE 0.014 (WIRE) ×3 IMPLANT
HEMOSTAT SNOW SURGICEL 2X4 (HEMOSTASIS) ×1 IMPLANT
INTRODUCER KIT GALT 7CM (INTRODUCER) ×3
KIT BASIN OR (CUSTOM PROCEDURE TRAY) ×3 IMPLANT
KIT ENCORE 26 ADVANTAGE (KITS) ×3 IMPLANT
KIT INTRODUCER GALT 7 (INTRODUCER) ×2 IMPLANT
KIT TURNOVER KIT B (KITS) ×3 IMPLANT
NDL HYPO 25GX1X1/2 BEV (NEEDLE) IMPLANT
NDL PERC 18GX7CM (NEEDLE) IMPLANT
NEEDLE HYPO 25GX1X1/2 BEV (NEEDLE) IMPLANT
NEEDLE PERC 18GX7CM (NEEDLE) ×3 IMPLANT
PACK CAROTID (CUSTOM PROCEDURE TRAY) ×3 IMPLANT
POSITIONER HEAD DONUT 9IN (MISCELLANEOUS) ×3 IMPLANT
PROTECTION STATION PRESSURIZED (MISCELLANEOUS) ×3
SET MICROPUNCTURE 5F STIFF (MISCELLANEOUS) IMPLANT
STATION PROTECTION PRESSURIZED (MISCELLANEOUS) IMPLANT
STENT TRANSCAROTID SYS 10X40 (Permanent Stent) ×1 IMPLANT
SUT PROLENE 5 0 C 1 24 (SUTURE) ×5 IMPLANT
SUT PROLENE 6 0 BV (SUTURE) ×1 IMPLANT
SUT SILK 2 0 PERMA HAND 18 BK (SUTURE) ×2 IMPLANT
SUT SILK 2 0 SH (SUTURE) ×3 IMPLANT
SUT VIC AB 3-0 SH 27 (SUTURE) ×6
SUT VIC AB 3-0 SH 27X BRD (SUTURE) ×4 IMPLANT
SUT VIC AB 4-0 PS2 27 (SUTURE) ×3 IMPLANT
SYR 10ML LL (SYRINGE) ×9 IMPLANT
SYR 20ML LL LF (SYRINGE) ×3 IMPLANT
SYR CONTROL 10ML LL (SYRINGE) IMPLANT
SYSTEM TRANSCAROTID NEUROPRTCT (MISCELLANEOUS) ×2 IMPLANT
TOWEL GREEN STERILE (TOWEL DISPOSABLE) ×3 IMPLANT
TRANSCAROTID NEUROPROTECT SYS (MISCELLANEOUS) ×3
WATER STERILE IRR 1000ML POUR (IV SOLUTION) ×3 IMPLANT
WIRE BENTSON .035X145CM (WIRE) ×3 IMPLANT

## 2020-04-27 NOTE — Consult Note (Signed)
Reason for Consult: Code stroke  referring Physician: [name] is an 79 y.o. male.  HPI: John Lambert is 79 year old pleasant Caucasian male who was admitted on 04/19/2020 with left hemispheric infarcts secondary to symptomatic 80% left proximal carotid stenosis with a soft plaque. He was doing well with only mild right hand residual weakness and because he was on long-term anticoagulation for blood clotting disorder he was kept in the hospital and underwent elective left carotid TCAR procedure with stent placement earlier this morning. Following the procedure he was found to be aphasic unable to speak with left gaze deviation and right hemiparesis. Code stroke was activated in PACU. Stroke team arrived and assessed the patient he clearly had a expressive aphasia left gaze deviation dense right hemiplegia with NIH stroke scale of 20. Noncontrast CT scan of the head was obtained which showed no acute abnormality and aspect score of 10. Patient taken for emergent CT angiogram which I personally reviewed which showed patent left carotid stent with good flow in the terminal left ICA as well as middle cerebral artery in the M1 segment. There appeared to be decreased flow in one of the M2 branches and since patient's neurological exam was not improving after discussion with neuro interventional radiology Dr. Estanislado Pandy was felt necessary to do a emergent diagnostic angiogram and possible mechanical thrombectomy if necessary. Informed consent was obtained from the patient's wife and daughter and Dr. Trula Slade was informed and agreed with the plan. Last seen normal 8 AM prior to carotid procedure 04/27/20 IV TPA considered no due to surgery today Mechanical thrombectomy considered yes Past Medical History:  Diagnosis Date  . Blood clotting disorder ([place])   . Blood dyscrasia   . Cancer ([place])    melanoma removed from right calf  . Hypertension   . Neuromuscular disorder ([place])    neurofibroma removed  from spine    Past Surgical History:  Procedure Laterality Date  . SKIN BIOPSY      History reviewed. No pertinent family history.  Social History:  reports that he has quit smoking. He has never used smokeless tobacco. He reports current alcohol use of about 2.0 standard drinks of alcohol per week. He reports that he does not use drugs.  Allergies:  Allergies  Allergen Reactions  . Hctz [Hydrochlorothiazide] Other (See Comments)    dizzy  . Hydrocodone Itching  . Vicodin [Hydrocodone-Acetaminophen] Itching    Medications: I have reviewed the patient's current medications.  Results for orders placed or performed during the hospital encounter of 04/27/20 (from the past 48 hour(s))  ABO/Rh     Status: None   Collection Time: 04/26/20  9:35 PM  Result Value Ref Range   ABO/RH(D)      A POS Performed at [facility], [place] [address], [place], [place] [address]     CT Code Stroke CTA Head W/WO contrast  Result Date: 04/27/2020 CLINICAL DATA:  Flaccid right-side EXAM: CT ANGIOGRAPHY HEAD AND NECK TECHNIQUE: Multidetector CT imaging of the head and neck was performed using the standard protocol during bolus administration of intravenous contrast. Multiplanar CT image reconstructions and MIPs were obtained to evaluate the vascular anatomy. Carotid stenosis measurements (when applicable) are obtained utilizing NASCET criteria, using the distal internal carotid diameter as the denominator. CONTRAST:  110mL OMNIPAQUE IOHEXOL 350 MG/ML SOLN COMPARISON:  Head CT from earlier today. CTA of the head neck from 7 days ago FINDINGS: CTA NECK FINDINGS Aortic arch: Atherosclerosis with 3 vessel branching. Right  carotid system: Limited atheromatous changes. No stenosis or ulceration. Left carotid system: Trans carotid revascularization with stent extending from distal common to proximal internal carotid. There is related adjacent soft tissue gas from the surgical access. Wasting of the stent which  measures 50% stenosis compared to the more distal stent, per coronal reformats. No ulceration, dissection, or thrombosis seen. Vertebral arteries: No proximal subclavian stenosis. The vertebral arteries are diffusely patent Skeleton: Ordinary degenerative and spondylitic spurring. Other neck: Soft tissue gas related to surgical access. There is medial ization of the left vocal fold. Upper chest: Mild reticulation in the upper lungs with stability favoring scarring. Review of the MIP images confirms the above findings CTA HEAD FINDINGS Anterior circulation: Carotid siphon atheromatous calcification. Aplastic right A1 segment. No major branch occlusion, beading, or aneurysm. Mild atheromatous irregularity to bilateral medium size vessels. Posterior circulation: Mild left vertebral artery dominance. Generalized atheromatous irregularity of the vertebral and basilar arteries with 40% stenosis of the left V4 segment as measured on coronal reformats. Fetal type right PCA. No branch occlusion, beading, or aneurysm Venous sinuses: Unremarkable in the arterial phase Anatomic variants: As above Review of the MIP images confirms the above findings IMPRESSION: 1. Left trans carotid revascularization. The stent is patent with no visible embolic disease to the major intracranial vessels. There is wasting of the stent at the level of treated plaque, measuring approximately 50%. 2. Moderate left V4 segment stenosis. 3. Medial ization of the left vocal fold, please correlate for clinical signs of paresis. Electronically Signed   By: Marnee Spring M.D.   On: 04/27/2020 10:46   CT Code Stroke CTA Neck W/WO contrast  Result Date: 04/27/2020 CLINICAL DATA:  Flaccid right-side EXAM: CT ANGIOGRAPHY HEAD AND NECK TECHNIQUE: Multidetector CT imaging of the head and neck was performed using the standard protocol during bolus administration of intravenous contrast. Multiplanar CT image reconstructions and MIPs were obtained to evaluate  the vascular anatomy. Carotid stenosis measurements (when applicable) are obtained utilizing NASCET criteria, using the distal internal carotid diameter as the denominator. CONTRAST:  49mL OMNIPAQUE IOHEXOL 350 MG/ML SOLN COMPARISON:  Head CT from earlier today. CTA of the head neck from 7 days ago FINDINGS: CTA NECK FINDINGS Aortic arch: Atherosclerosis with 3 vessel branching. Right carotid system: Limited atheromatous changes. No stenosis or ulceration. Left carotid system: Trans carotid revascularization with stent extending from distal common to proximal internal carotid. There is related adjacent soft tissue gas from the surgical access. Wasting of the stent which measures 50% stenosis compared to the more distal stent, per coronal reformats. No ulceration, dissection, or thrombosis seen. Vertebral arteries: No proximal subclavian stenosis. The vertebral arteries are diffusely patent Skeleton: Ordinary degenerative and spondylitic spurring. Other neck: Soft tissue gas related to surgical access. There is medial ization of the left vocal fold. Upper chest: Mild reticulation in the upper lungs with stability favoring scarring. Review of the MIP images confirms the above findings CTA HEAD FINDINGS Anterior circulation: Carotid siphon atheromatous calcification. Aplastic right A1 segment. No major branch occlusion, beading, or aneurysm. Mild atheromatous irregularity to bilateral medium size vessels. Posterior circulation: Mild left vertebral artery dominance. Generalized atheromatous irregularity of the vertebral and basilar arteries with 40% stenosis of the left V4 segment as measured on coronal reformats. Fetal type right PCA. No branch occlusion, beading, or aneurysm Venous sinuses: Unremarkable in the arterial phase Anatomic variants: As above Review of the MIP images confirms the above findings IMPRESSION: 1. Left trans carotid revascularization. The stent is  patent with no visible embolic disease to the  major intracranial vessels. There is wasting of the stent at the level of treated plaque, measuring approximately 50%. 2. Moderate left V4 segment stenosis. 3. Medial ization of the left vocal fold, please correlate for clinical signs of paresis. Electronically Signed   By: Monte Fantasia M.D.   On: 04/27/2020 10:46   CT HEAD CODE STROKE WO CONTRAST  Result Date: 04/27/2020 CLINICAL DATA:  Code stroke.  Right-sided gaze.  Flaccid right-sided EXAM: CT HEAD WITHOUT CONTRAST TECHNIQUE: Contiguous axial images were obtained from the base of the skull through the vertex without intravenous contrast. COMPARISON:  None. FINDINGS: Brain: Small remote cortical infarcts in the posterior right frontal and left frontal parietal region at the vertex. Subacute infarcts in the left frontal lobe by prior brain MRI are in apparent on this study. Chronic small vessel disease with chronic appearing lacune. At the bilateral caudate nuclei. Confluent chronic small vessel ischemia in the cerebral white matter. No hemorrhage, hydrocephalus, or masslike finding. Cerebral atrophy. Vascular: Prominent density of the supraclinoid left ICA on axial slices, but not persisting on reformats. A CTA is already ordered. Atherosclerosis Skull: Normal. Negative for fracture or focal lesion. Sinuses/Orbits: Retention cyst in the left maxillary sinus. Other: These results were communicated to Dr. Leonie Man at 10:17 [place] 12/22/2021by text page via the [facility] messaging system. ASPECTS [facility] Stroke Program Early CT Score) - Ganglionic level infarction (caudate, lentiform nuclei, internal capsule, insula, M1-M3 cortex): 7 - Supraganglionic infarction (M4-M6 cortex): 3 (when accounting for chronic infarction) Total score (0-10 with 10 being normal): 10 IMPRESSION: 1. No hemorrhage or visible acute infarct. 2. Subacute infarcts in the left frontal lobe by recent brain MRI are not visible today. 3. Small remote cortical infarcts.  Chronic small vessel  disease. Electronically Signed   By: Monte Fantasia M.D.   On: 04/27/2020 10:20   Structural Heart Procedure  Result Date: 04/27/2020 See surgical note for result.     ROS Blood pressure 109/70, pulse 98, temperature 97.7 F (36.5 C), temperature source Oral, resp. rate 14, height 6\' 2"  (1.88 m), weight 79.7 kg, SpO2 98 %. Physical Exam Elderly Caucasian male not in distress. . Afebrile. Head is nontraumatic. Neck is supple without bruit.    Cardiac exam no murmur or gallop. Lungs are clear to auscultation. Distal pulses are well felt. Neurological Exam : Drowsy but can be aroused to follow commands. Left gaze preference but not able to look to the right past midline the moves head. Blinks to threat on the left but not on the right. Follows 1 and two-step commands. Expressive aphasia and unable to speak. Right lower facial weakness. Purposeful antigravity movements on the left side due to poor effort testing for left leg drift. Right upper extremity is flaccid with 0/5 strength. Right lower extremity able to lift off the bed but has drift.  Interval: Initial (12/22 1000) Level of Consciousness (1a.)   : Alert, keenly responsive (12/22 1030) LOC Questions (1b. )   +: Answers neither question correctly (12/22 1030) LOC Commands (1c. )   + : Performs both tasks correctly (12/22 1030) Best Gaze (2. )  +: Forced deviation (12/22 1030) Visual (3. )  +: Complete hemianopia (12/22 1030) Facial Palsy (4. )    : Minor paralysis (12/22 1030) Motor Arm, Left (5a. )   +: No movement (12/22 1030) Motor Arm, Right (5b. )   +: No drift (12/22 1030) Motor Leg, Left (6a. )   +:  Drift (12/22 1030) Motor Leg, Right (6b. )   +: Some effort against gravity (12/22 1030) Limb Ataxia (7. ): Absent (12/22 1030) Sensory (8. )   +: Severe to total sensory loss, patient is not aware of being touched in the face, arm, and leg (12/22 1030) Best Language (9. )   +: Severe aphasia (12/22 1030) Dysarthria (10. ):  Mild-to-moderate dysarthria, patient slurs at least some words and, at worst, can be understood with some difficulty (12/22 1030) Extinction/Inattention (11.)   +: No Abnormality (12/22 1030) Modified SS Total  +: 17 (12/22 1030) Complete NIHSS TOTAL: 19 (12/22 1030) Baseline modified Rankin score 2 Assessment/Plan: 79 year old Caucasian male with sudden onset of expressive aphasia left gaze deviation and right hemiplegia following elective left carotid TCAR procedure and stenting likely due to post procedure embolization versus stent occlusion. Plan emergent left carotid diagnostic cerebral catheter angiogram and if large vessel occlusion is found proceed with mechanical thrombectomy. Patient is noted TPA candidate given recent surgery. Long discussion with the patient's wife, daughter and Dr. Trula Slade in the vascular and answered questions. Admit to neurological intensive care unit after the procedure. Strict blood pressure control with systolic 0000000 range. Continue aspirin and Plavix given  carotid stent. Check MRI scan of the brain later. Aggressive risk factor modification. This patient is critically ill and at significant risk of neurological worsening, death and care requires constant monitoring of vital signs, hemodynamics,respiratory and cardiac monitoring, extensive review of multiple databases, frequent neurological assessment, discussion with family, other specialists and medical decision making of high complexity.I have made any additions or clarifications directly to the above note.This critical care time does not reflect procedure time, or teaching time or supervisory time of PA/NP/Med Resident etc but could involve care discussion time.  I spent 90 minutes of neurocritical care time  in the care of  this patient.      Antony Contras 04/27/2020, 11:52 AM    Note: This document was prepared with digital dictation and possible smart phrase technology. Any transcriptional errors that  result from this process are unintentional.

## 2020-04-27 NOTE — Progress Notes (Signed)
PT Cancellation Note  Patient Details Name: John Lambert MRN: [id number] DOB: 1941/03/08   Cancelled Treatment:    Reason Eval/Treat Not Completed: Patient at procedure or test/unavailable   Arby Barrette, PT Pager [phone]  Rexanne Mano 04/27/2020, 7:58 AM

## 2020-04-27 NOTE — Progress Notes (Signed)
Patient underwent successful left sided TCAR for symptomatic stenosis.  There were no intraprocedural complications and the stent was widely patent at the completion of the procedure.  The patient was slow to wake up from anesthesia and had difficulty moving his right side.  A code stroke was called immediately and he was evaluated by the stroke team upon arrival to the PACU.  He was taken to CT scan.  A CT angiogram was performed.  This showed that the carotid stent was widely patent.  Therefore this is likely a embolic process.  The case will be discussed with neuroradiology for possible mechanical thrombectomy.  I have updated the patient's wife and daughter with the details of the procedure as well as the issues postoperatively.  John Lambert

## 2020-04-27 NOTE — Transfer of Care (Signed)
Immediate Anesthesia Transfer of Care Note  Patient: John Lambert  Procedure(s) Performed: LEFT TRANSCAROTID ARTERY REVASCULARIZATION (Left Neck) ULTRASOUND GUIDANCE FOR VASCULAR ACCESS (Left Groin)  Patient Location: PACU  Anesthesia Type:General  Level of Consciousness: drowsy and patient cooperative  Airway & Oxygen Therapy: Patient Spontanous Breathing and Patient connected to face mask oxygen  Post-op Assessment: Report given to RN and Post -op Vital signs reviewed and stable  Post vital signs: Reviewed and stable  Last Vitals:  Vitals Value Taken Time  BP 108/63 04/27/20 0951  Temp    Pulse 98 04/27/20 0952  Resp 14 04/27/20 0952  SpO2 98 % 04/27/20 0952  Vitals shown include unvalidated device data.  Last Pain:  Vitals:   04/27/20 0500  TempSrc: Oral  PainSc:          Complications: No complications documented.

## 2020-04-27 NOTE — Anesthesia Preprocedure Evaluation (Signed)
Anesthesia Evaluation  Patient identified by MRN, date of birth, ID band Patient awake    Reviewed: Allergy & Precautions, NPO status , Patient's Chart, lab work & pertinent test results  Airway Mallampati: II  TM Distance: >3 FB Neck ROM: Full    Dental  (+) Dental Advisory Given, Chipped   Pulmonary neg pulmonary ROS, former smoker,    Pulmonary exam normal breath sounds clear to auscultation       Cardiovascular hypertension, Pt. on medications and Pt. on home beta blockers Normal cardiovascular exam Rhythm:Regular Rate:Normal  Echo 04/2020 1. Left ventricular ejection fraction, by estimation, is 60 to 65%. The left ventricle has normal function. The left ventricle has no regional wall motion abnormalities. There is moderate left ventricular hypertrophy of the basal-septal segment. Left ventricular diastolic parameters are consistent with Grade I diastolic dysfunction (impaired relaxation).  2. Right ventricular systolic function is normal. The right ventricular size is normal. Tricuspid regurgitation signal is inadequate for assessing PA pressure.  3. The mitral valve is normal in structure. Trivial mitral valve regurgitation. No evidence of mitral stenosis.  4. The aortic valve is tricuspid. Aortic valve regurgitation is trivial. Mild to moderate aortic valve sclerosis/calcification is present, without  any evidence of aortic stenosis.  5. The inferior vena cava is normal in size with <50% respiratory variability, suggesting right atrial pressure of 8 mmHg.    Neuro/Psych CVA    GI/Hepatic negative GI ROS, Neg liver ROS,   Endo/Other  negative endocrine ROS  Renal/GU negative Renal ROS     Musculoskeletal negative musculoskeletal ROS (+)   Abdominal   Peds  Hematology  (+) Blood dyscrasia, ,   Anesthesia Other Findings   Reproductive/Obstetrics                             Anesthesia  Physical  Anesthesia Plan  ASA: III  Anesthesia Plan: General   Post-op Pain Management:    Induction: Intravenous  PONV Risk Score and Plan: 2 and Ondansetron, Dexamethasone and Treatment may vary due to age or medical condition  Airway Management Planned: Oral ETT  Additional Equipment: Arterial line  Intra-op Plan:   Post-operative Plan: Extubation in OR  Informed Consent: I have reviewed the patients History and Physical, chart, labs and discussed the procedure including the risks, benefits and alternatives for the proposed anesthesia with the patient or authorized representative who has indicated his/her understanding and acceptance.     Dental advisory given  Plan Discussed with: CRNA  Anesthesia Plan Comments:         Anesthesia Quick Evaluation

## 2020-04-27 NOTE — Anesthesia Postprocedure Evaluation (Signed)
Anesthesia Post Note  Patient: John Lambert  Procedure(s) Performed: LEFT TRANSCAROTID ARTERY REVASCULARIZATION (Left Neck) ULTRASOUND GUIDANCE FOR VASCULAR ACCESS (Left Groin)     Patient location during evaluation: PACU Anesthesia Type: General Level of consciousness: sedated Pain management: pain level controlled Vital Signs Assessment: post-procedure vital signs reviewed and stable Respiratory status: spontaneous breathing Cardiovascular status: stable Anesthetic complications: no Comments: Unable to move R side noted immediately after extubation. Dr. Trula Slade at bedside in [place]. Plan to go to CT for code stroke. Taken to PACU and code stroke team transported to CT   No complications documented.  Last Vitals:  Vitals:   04/27/20 0953 04/27/20 1044  BP:  109/70  Pulse: 98   Resp: 14   Temp:    SpO2: 98%     Last Pain:  Vitals:   04/27/20 0500  TempSrc: Oral  PainSc:                  John Lambert

## 2020-04-27 NOTE — Code Documentation (Signed)
Stroke Response Nurse Documentation Code Documentation  AYVIN LIPINSKI is a 79 y.o. male admitted to [place]  On 12/14 due to stroke. Patient was noted to have 80% stenosis to the left carotid per imaging and taken for TCAR and stent placement this morning. LKW 0800 prior to Anesthesia. Family reports patient had mild right hand weakness and some visual deficits from the stroke on admission. After procedure, pt was noted to being unable to speak, left gaze, right sided weakness. Code stroke was activated by PACU. On aspirin 81 mg daily and clopidogrel 75 mg daily prior to procedure, not given this morning due to being NPO.   Stroke team at the bedside in PACU. Patient stable for CT per Dr. Trula Slade. Patient to CT with team. NIHSS 20, see documentation for details and code stroke times. Patient with decreased LOC, disoriented, left gaze preference , right hemianopia, right facial droop, right arm weakness, bilateral leg weakness, right decreased sensation, Expressive aphasia  and dysarthria  on exam. The following imaging was completed:  CT, CTA head and neck. Patient is not a candidate for tPA due to recent surgery and heparin given during procedure. CTA did not show LVO per Dr. Leonie Man, but symptoms are suggestive of LVO. Patient taken to IR for cath angio and potential mechanical thrombectomy. Bedside handoff with IR RN Marylyn Ishihara.    Kathrin Greathouse  Stroke Response RN

## 2020-04-27 NOTE — Procedures (Addendum)
Patient Name: John Lambert  MRN: [id number]  Epilepsy Attending: Lora Havens  Referring Physician/Provider: Dr Antony Contras Date: 04/27/2020 Duration: 25.57 mins  Patient history: 79 year old Caucasian male with sudden onset of expressive aphasia left gaze deviation and right hemiplegia. EEG to evaluate for seizure  Level of alertness: Awake  AEDs during EEG study: None  Technical aspects: This EEG study was done with scalp electrodes positioned according to the 10-20 International system of electrode placement. Electrical activity was acquired at a sampling rate of 500Hz  and reviewed with a high frequency filter of 70Hz  and a low frequency filter of 1Hz . EEG data were recorded continuously and digitally stored.   Description: The posterior dominant rhythm consists of 8 Hz activity of moderate voltage (25-35 uV) seen predominantly in posterior head regions, symmetric and reactive to eye opening and eye closing. EEG showed continuous generalized 3 to 6 Hz theta-delta slowing in left hemisphere, maximal left frontotemporal region, which at times appears sharply contoured. Hyperventilation and photic stimulation were not performed.   ABNORMALITY -Continuous slow, left hemisphere, maximal left frontotemporal region  IMPRESSION: This study is suggestive of cortical dysfunction in left hemisphere, maximal left fronto-temporal region likely secondary to underlying stroke/structural abnormality. No seizures or epileptiform discharges were seen throughout the recording.  Essam Lowdermilk Barbra Sarks

## 2020-04-27 NOTE — Anesthesia Procedure Notes (Signed)
Procedure Name: Intubation Date/Time: 04/27/2020 11:06 AM Performed by: Trinna Post., CRNA Pre-anesthesia Checklist: Patient identified, Emergency Drugs available, Suction available, Patient being monitored and Timeout performed Patient Re-evaluated:Patient Re-evaluated prior to induction Oxygen Delivery Method: Circle system utilized Preoxygenation: Pre-oxygenation with 100% oxygen Induction Type: IV induction Ventilation: Mask ventilation without difficulty Laryngoscope Size: Mac and 4 Grade View: Grade I Tube type: Oral Tube size: 7.5 mm Number of attempts: 1 Airway Equipment and Method: Stylet Placement Confirmation: ETT inserted through vocal cords under direct vision,  positive ETCO2 and breath sounds checked- equal and bilateral Secured at: 23 cm Tube secured with: Tape Dental Injury: Teeth and Oropharynx as per pre-operative assessment

## 2020-04-27 NOTE — Progress Notes (Signed)
PROGRESS NOTE    John Lambert  [id number] DOB: 02-24-1941 DOA: 04/19/2020 PCP: Harlan Stains, MD     Brief Narrative:  79 y.o. 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 Punctate acute infarction also affecting the splenium ofthe corpus callosum. Extensive chronic small-vessel ischemic changes elsewherethroughout the brain. Small old left parietal vertex cortical and subcorticalInfarctions.  MRI lumbar spine showed chronic arachnoiditis with adhesions in the thecal sac and distorted cauda equina nerve roots from L2-L3 to the sacrum, but no evidence of recurrent spinal tumor.  Possible right L5/S1 radiculitis after noting L4/5 multifactorial mild to moderate spinal stenosis.  Vascular surgery was consulted and took patient on 12/22 for a left transcarotid artery stenting.  Patient found to have 80% carotid stenosis and then following procedure down to less than 10% with stent.  However, following surgery patient noted to have difficulty moving right side expressive aphasia and left gaze deviation and code stroke immediately called.  Patient taken straight to CT scan.  No new acute infarction noted.  Patient underwent emergent left carotid diagnostic cerebral catheter angiogram with left MCA embolism noted from previous, but no acute findings.  No intervention planned.  Patient sent to medical ICU in the absence  of beds in the neuro ICU.  EEG noted cortical dysfunction left hemisphere, but no seizures or epileptiform discharges seen throughout recording.  Patient transferred to stroke service  Assessment & Plan:   Principal Problem:   Weakness Active Problems:   Anticoagulated on warfarin   Impaired ambulation   Fall at home, initial encounter   Essential hypertension   Blood clotting disorder ([place])   Cerebral embolism with cerebral infarction   Stroke [facility])   Middle cerebral artery embolism, left   Acute CVA from left carotid artery stenosis status post left carotid stent with complications of possible new acute CVA.  Catheter angiogram unrevealing. Keep in ICU.  Neurology following.  Left carotid artery stenosis -Carotid duplex and CT angio of neck showed significant stenosis of the left carotid artery. -Vascular surgery consult appreciated.  Patient is tentatively scheduled for carotid artery stenting tentatively on Wednesday 12/22. -Prior to admission patient was on Coumadin.  As per recommendation by vascular surgery, Coumadin has been switched to full dose Lovenox.  He has been also started on dual antiplatelet coverage with aspirin and Plavix.  Anticoagulation post procedure to be decided by vascular surgery.  Hypertension:  -Home meds include Coreg and lisinopril. Continue Coreg.  Lisinopril remains on hold. -Continue to monitor.  Blood clotting disorder, history of DVT/PE -On Coumadin at home.  -Ultrasound duplex of lower extremities showed age-indeterminate, probably chronic DVT without symptoms. -Patient is currently on full dose Lovenox  Left thigh pain -Shooting pain, intermittent.  Continue Flexeril nightly as needed.   DVT prophylaxis: SCDs Code Status: Full code Family Communication: Family updated. Disposition Plan: Dependent on if patient recovers  Status is: Inpatient  Remains inpatient appropriate because:Inpatient level of care appropriate due to  severity of illness   Dispo: The patient is from: Home              Anticipated d/c is dependent on if patient recovers              Anticipated d/c date is: > 3 days              Patient currently is not medically stable to d/c.   Consultants:   Vascular surgery.  Interventional radiology  Neurology  Procedures:  Catheter angiogram done emergently 12/22 Status post left carotid stent placement Antimicrobials: Anti-infectives (From admission, onward)   Start     Dose/Rate Route Frequency Ordered Stop   04/27/20 0600  ceFAZolin (ANCEF) IVPB 2g/100 mL premix  Status:  Discontinued       Note to Pharmacy: Send with pt to OR   2 g 200 mL/hr over 30 Minutes Intravenous On call 04/26/20 0805 04/26/20 2018   04/27/20 0600  ceFAZolin (ANCEF) IVPB 2g/100 mL premix        2 g 200 mL/hr over 30 Minutes Intravenous 30 min pre-op 04/26/20 2013 04/27/20 0804   04/27/20 0000  ceFAZolin (ANCEF) IVPB 1 g/50 mL premix  Status:  Discontinued       Note to Pharmacy: Send with pt to OR   1 g 100 mL/hr over 30 Minutes Intravenous On call 04/26/20 0800 04/26/20 0805      Subjective: Patient was seen and examined at bedside. Overnight events noted.  He appears tired,  states he could not slept well last night. Patient is anticipating carotid artery stenting on Wed 12/22.  Objective: Vitals:   04/27/20 1435 04/27/20 1533 04/27/20 1600 04/27/20 1700  BP: 98/65  115/63 114/83  Pulse: 62  62 77  Resp: 14  14 17   Temp:  98.7 F (37.1 C)    TempSrc:  Axillary    SpO2: 98%  98% 96%  Weight:      Height:        Intake/Output Summary (Last 24 hours) at 04/27/2020 1824 Last data filed at 04/27/2020 1140 Gross per 24 hour  Intake 1639.59 ml  Output 580 ml  Net 1059.59 ml   Filed Weights   04/19/20 0043 04/20/20 0037 04/20/20 2034  Weight: 77.1 kg 75.8 kg 79.7 kg      Data Reviewed: I have personally reviewed following labs and imaging studies  CBC: Recent Labs  Lab  04/21/20 0423 04/24/20 0105 04/25/20 0237 04/26/20 0210 04/26/20 2108 04/27/20 0253  WBC 8.0 7.0 6.5 7.2 6.9 6.8  NEUTROABS 5.7  --   --   --   --   --   HGB 15.9 15.2 15.2 15.3 16.2 15.9  HCT 46.5 46.0 46.0 45.0 49.2 46.9  MCV 89.3 90.0 90.6 89.5 89.6 91.1  PLT 216 223 211 216 224 [id number]   Basic Metabolic Panel: Recent Labs  Lab 04/21/20 0423 04/26/20 0210 04/26/20 2108 04/27/20 0253  NA 140 138 137 141  K 4.3 3.8 4.1 4.0  CL 103 105 104 105  CO2 25 22 24 26   GLUCOSE 94 96 113* 98  BUN 15 16 15 13   CREATININE 0.88 0.77 0.87 0.91  CALCIUM 8.9 8.3* 8.6* 8.7*  MG 1.9 1.9  --  2.1  PHOS 3.5 3.6  --  3.1   GFR: Estimated Creatinine Clearance: 71.8 mL/min (by C-G formula based on SCr of 0.91 mg/dL). Liver Function Tests: Recent Labs  Lab 04/26/20 2108  AST 30  ALT  28  ALKPHOS 52  BILITOT 0.8  PROT 6.3*  ALBUMIN 3.5   No results for input(s): LIPASE, AMYLASE in the last 168 hours. No results for input(s): AMMONIA in the last 168 hours. Coagulation Profile: Recent Labs  Lab 04/24/20 0105 04/25/20 0237 04/26/20 0210 04/26/20 2108 04/27/20 0253  INR 2.2* 1.9* 1.6* 1.3* 1.3*   Cardiac Enzymes: No results for input(s): CKTOTAL, CKMB, CKMBINDEX, TROPONINI in the last 168 hours. BNP (last 3 results) No results for input(s): PROBNP in the last 8760 hours. HbA1C: No results for input(s): HGBA1C in the last 72 hours. CBG: Recent Labs  Lab 04/27/20 1532  GLUCAP 114*   Lipid Profile: No results for input(s): CHOL, HDL, LDLCALC, TRIG, CHOLHDL, LDLDIRECT in the last 72 hours. Thyroid Function Tests: No results for input(s): TSH, T4TOTAL, FREET4, T3FREE, THYROIDAB in the last 72 hours. Anemia Panel: No results for input(s): VITAMINB12, FOLATE, FERRITIN, TIBC, IRON, RETICCTPCT in the last 72 hours. Sepsis Labs: No results for input(s): PROCALCITON, LATICACIDVEN in the last 168 hours.  Recent Results (from the past 240 hour(s))  Resp Panel by RT-PCR (Flu A&B,  Covid) Nasopharyngeal Swab     Status: None   Collection Time: 04/19/20  6:20 AM   Specimen: Nasopharyngeal Swab; Nasopharyngeal(NP) swabs in vial transport medium  Result Value Ref Range Status   SARS Coronavirus 2 by RT PCR NEGATIVE NEGATIVE Final    Comment: (NOTE) SARS-CoV-2 target nucleic acids are NOT DETECTED.  The SARS-CoV-2 RNA is generally detectable in upper respiratory specimens during the acute phase of infection. The lowest concentration of SARS-CoV-2 viral copies this assay can detect is 138 copies/mL. A negative result does not preclude SARS-Cov-2 infection and should not be used as the sole basis for treatment or other patient management decisions. A negative result may occur with  improper specimen collection/handling, submission of specimen other than nasopharyngeal swab, presence of viral mutation(s) within the areas targeted by this assay, and inadequate number of viral copies(<138 copies/mL). A negative result must be combined with clinical observations, patient history, and epidemiological information. The expected result is Negative.  Fact Sheet for Patients:  EntrepreneurPulse.com.au  Fact Sheet for Healthcare Providers:  IncredibleEmployment.be  This test is no t yet approved or cleared by the [place] FDA and  has been authorized for detection and/or diagnosis of SARS-CoV-2 by FDA under an Emergency Use Authorization (EUA). This EUA will remain  in effect (meaning this test can be used) for the duration of the COVID-19 declaration under Section 564(b)(1) of the Act, 21 U.S.C.section 360bbb-3(b)(1), unless the authorization is terminated  or revoked sooner.       Influenza A by PCR NEGATIVE NEGATIVE Final   Influenza B by PCR NEGATIVE NEGATIVE Final    Comment: (NOTE) The Xpert Xpress SARS-CoV-2/FLU/RSV plus assay is intended as an aid in the diagnosis of influenza from Nasopharyngeal swab specimens and should  not be used as a sole basis for treatment. Nasal washings and aspirates are unacceptable for Xpert Xpress SARS-CoV-2/FLU/RSV testing.  Fact Sheet for Patients: EntrepreneurPulse.com.au  Fact Sheet for Healthcare Providers: IncredibleEmployment.be  This test is not yet approved or cleared by the [place] FDA and has been authorized for detection and/or diagnosis of SARS-CoV-2 by FDA under an Emergency Use Authorization (EUA). This EUA will remain in effect (meaning this test can be used) for the duration of the COVID-19 declaration under Section 564(b)(1) of the Act, 21 U.S.C. section 360bbb-3(b)(1), unless the authorization is terminated or revoked.  Performed at  [facility], [address] [place], [place] [address]     Radiology Studies: CT Code Stroke CTA Head W/WO contrast  Result Date: 04/27/2020 CLINICAL DATA:  Flaccid right-side EXAM: CT ANGIOGRAPHY HEAD AND NECK TECHNIQUE: Multidetector CT imaging of the head and neck was performed using the standard protocol during bolus administration of intravenous contrast. Multiplanar CT image reconstructions and MIPs were obtained to evaluate the vascular anatomy. Carotid stenosis measurements (when applicable) are obtained utilizing NASCET criteria, using the distal internal carotid diameter as the denominator. CONTRAST:  78mL OMNIPAQUE IOHEXOL 350 MG/ML SOLN COMPARISON:  Head CT from earlier today. CTA of the head neck from 7 days ago FINDINGS: CTA NECK FINDINGS Aortic arch: Atherosclerosis with 3 vessel branching. Right carotid system: Limited atheromatous changes. No stenosis or ulceration. Left carotid system: Trans carotid revascularization with stent extending from distal common to proximal internal carotid. There is related adjacent soft tissue gas from the surgical access. Wasting of the stent which measures 50% stenosis compared to the more distal stent, per coronal reformats. No  ulceration, dissection, or thrombosis seen. Vertebral arteries: No proximal subclavian stenosis. The vertebral arteries are diffusely patent Skeleton: Ordinary degenerative and spondylitic spurring. Other neck: Soft tissue gas related to surgical access. There is medial ization of the left vocal fold. Upper chest: Mild reticulation in the upper lungs with stability favoring scarring. Review of the MIP images confirms the above findings CTA HEAD FINDINGS Anterior circulation: Carotid siphon atheromatous calcification. Aplastic right A1 segment. No major branch occlusion, beading, or aneurysm. Mild atheromatous irregularity to bilateral medium size vessels. Posterior circulation: Mild left vertebral artery dominance. Generalized atheromatous irregularity of the vertebral and basilar arteries with 40% stenosis of the left V4 segment as measured on coronal reformats. Fetal type right PCA. No branch occlusion, beading, or aneurysm Venous sinuses: Unremarkable in the arterial phase Anatomic variants: As above Review of the MIP images confirms the above findings IMPRESSION: 1. Left trans carotid revascularization. The stent is patent with no visible embolic disease to the major intracranial vessels. There is wasting of the stent at the level of treated plaque, measuring approximately 50%. 2. Moderate left V4 segment stenosis. 3. Medial ization of the left vocal fold, please correlate for clinical signs of paresis. Electronically Signed   By: Monte Fantasia M.D.   On: 04/27/2020 10:46   CT Code Stroke CTA Neck W/WO contrast  Result Date: 04/27/2020 CLINICAL DATA:  Flaccid right-side EXAM: CT ANGIOGRAPHY HEAD AND NECK TECHNIQUE: Multidetector CT imaging of the head and neck was performed using the standard protocol during bolus administration of intravenous contrast. Multiplanar CT image reconstructions and MIPs were obtained to evaluate the vascular anatomy. Carotid stenosis measurements (when applicable) are obtained  utilizing NASCET criteria, using the distal internal carotid diameter as the denominator. CONTRAST:  28mL OMNIPAQUE IOHEXOL 350 MG/ML SOLN COMPARISON:  Head CT from earlier today. CTA of the head neck from 7 days ago FINDINGS: CTA NECK FINDINGS Aortic arch: Atherosclerosis with 3 vessel branching. Right carotid system: Limited atheromatous changes. No stenosis or ulceration. Left carotid system: Trans carotid revascularization with stent extending from distal common to proximal internal carotid. There is related adjacent soft tissue gas from the surgical access. Wasting of the stent which measures 50% stenosis compared to the more distal stent, per coronal reformats. No ulceration, dissection, or thrombosis seen. Vertebral arteries: No proximal subclavian stenosis. The vertebral arteries are diffusely patent Skeleton: Ordinary degenerative and spondylitic spurring. Other neck: Soft tissue gas related  to surgical access. There is medial ization of the left vocal fold. Upper chest: Mild reticulation in the upper lungs with stability favoring scarring. Review of the MIP images confirms the above findings CTA HEAD FINDINGS Anterior circulation: Carotid siphon atheromatous calcification. Aplastic right A1 segment. No major branch occlusion, beading, or aneurysm. Mild atheromatous irregularity to bilateral medium size vessels. Posterior circulation: Mild left vertebral artery dominance. Generalized atheromatous irregularity of the vertebral and basilar arteries with 40% stenosis of the left V4 segment as measured on coronal reformats. Fetal type right PCA. No branch occlusion, beading, or aneurysm Venous sinuses: Unremarkable in the arterial phase Anatomic variants: As above Review of the MIP images confirms the above findings IMPRESSION: 1. Left trans carotid revascularization. The stent is patent with no visible embolic disease to the major intracranial vessels. There is wasting of the stent at the level of treated  plaque, measuring approximately 50%. 2. Moderate left V4 segment stenosis. 3. Medial ization of the left vocal fold, please correlate for clinical signs of paresis. Electronically Signed   By: Monte Fantasia M.D.   On: 04/27/2020 10:46   EEG adult  Result Date: 04/27/2020 Lora Havens, MD     04/27/2020  5:46 PM Patient Name: John Lambert MRN: [id number] Epilepsy Attending: Lora Havens Referring Physician/Provider: Dr Antony Contras Date: 04/27/2020 Duration: 25.57 mins Patient history: 79 year old Caucasian male with sudden onset of expressive aphasia left gaze deviation and right hemiplegia. EEG to evaluate for seizure Level of alertness: Awake, drowsy, sleep, comatose, lethargic AEDs during EEG study: None Technical aspects: This EEG study was done with scalp electrodes positioned according to the 10-20 International system of electrode placement. Electrical activity was acquired at a sampling rate of 500Hz  and reviewed with a high frequency filter of 70Hz  and a low frequency filter of 1Hz . EEG data were recorded continuously and digitally stored. Description: The posterior dominant rhythm consists of 8 Hz activity of moderate voltage (25-35 uV) seen predominantly in posterior head regions, symmetric and reactive to eye opening and eye closing. EEG showed continuous generalized 3 to 6 Hz theta-delta slowing in left hemisphere, maximal left frontotemporal region, which at times appears sharply contoured. Hyperventilation and photic stimulation were not performed. ABNORMALITY -Continuous slow, left hemisphere, maximal left frontotemporal region IMPRESSION: This study is suggestive of cortical dysfunction in left hemisphere, maximal left fronto-temporal region likely secondary to underlying stroke/structural abnormality. No seizures or epileptiform discharges were seen throughout the recording. Lora Havens   CT HEAD CODE STROKE WO CONTRAST  Result Date: 04/27/2020 CLINICAL DATA:  Code  stroke.  Right-sided gaze.  Flaccid right-sided EXAM: CT HEAD WITHOUT CONTRAST TECHNIQUE: Contiguous axial images were obtained from the base of the skull through the vertex without intravenous contrast. COMPARISON:  None. FINDINGS: Brain: Small remote cortical infarcts in the posterior right frontal and left frontal parietal region at the vertex. Subacute infarcts in the left frontal lobe by prior brain MRI are in apparent on this study. Chronic small vessel disease with chronic appearing lacune. At the bilateral caudate nuclei. Confluent chronic small vessel ischemia in the cerebral white matter. No hemorrhage, hydrocephalus, or masslike finding. Cerebral atrophy. Vascular: Prominent density of the supraclinoid left ICA on axial slices, but not persisting on reformats. A CTA is already ordered. Atherosclerosis Skull: Normal. Negative for fracture or focal lesion. Sinuses/Orbits: Retention cyst in the left maxillary sinus. Other: These results were communicated to Dr. Leonie Man at 10:17 [place] 12/22/2021by text page via the [facility] messaging system. ASPECTS (  [place] Stroke Program Early CT Score) - Ganglionic level infarction (caudate, lentiform nuclei, internal capsule, insula, M1-M3 cortex): 7 - Supraganglionic infarction (M4-M6 cortex): 3 (when accounting for chronic infarction) Total score (0-10 with 10 being normal): 10 IMPRESSION: 1. No hemorrhage or visible acute infarct. 2. Subacute infarcts in the left frontal lobe by recent brain MRI are not visible today. 3. Small remote cortical infarcts.  Chronic small vessel disease. Electronically Signed   By: Monte Fantasia M.D.   On: 04/27/2020 10:20   Structural Heart Procedure  Result Date: 04/27/2020 See surgical note for result.   Scheduled Meds: . aspirin EC  81 mg Oral Daily  . carvedilol  25 mg Oral BID WC  . Chlorhexidine Gluconate Cloth  6 each Topical Daily  . clopidogrel  75 mg Oral Daily  . enoxaparin (LOVENOX) injection  1 mg/kg (Adjusted)  Subcutaneous Q12H  . fentaNYL      . pravastatin  40 mg Oral Daily   Continuous Infusions: . sodium chloride 75 mL/hr at 04/27/20 1652  . clevidipine Stopped (04/27/20 1659)     LOS: 6 days    Time spent: 25 mins    Annita Brod, MD [facility]   If 7PM-7AM, please contact night-coverage

## 2020-04-27 NOTE — Transfer of Care (Signed)
Immediate Anesthesia Transfer of Care Note  Patient: John Lambert  Procedure(s) Performed: IR WITH ANESTHESIA (N/A )  Patient Location: PACU  Anesthesia Type:General  Level of Consciousness: awake and drowsy  Airway & Oxygen Therapy: Patient Spontanous Breathing and Patient connected to face mask oxygen  Post-op Assessment: Report given to RN and Post -op Vital signs reviewed and stable  Post vital signs: Reviewed and stable  Last Vitals:  Vitals Value Taken Time  BP 106/66 04/27/20 1202  Temp    Pulse 86 04/27/20 1205  Resp 24 04/27/20 1205  SpO2 96 % 04/27/20 1205  Vitals shown include unvalidated device data.  Last Pain:  Vitals:   04/27/20 0500  TempSrc: Oral  PainSc:          Complications: No complications documented.

## 2020-04-27 NOTE — Anesthesia Procedure Notes (Signed)
Arterial Line Insertion Start/End12/22/2021 8:28 AM, 04/27/2020 8:28 AM Performed by: Kathryne Hitch, CRNA, CRNA  Patient location: Pre-op. Preanesthetic checklist: patient identified, IV checked, site marked, risks and benefits discussed, surgical consent, monitors and equipment checked, pre-op evaluation, timeout performed and anesthesia consent Lidocaine 1% used for infiltration Right, radial was placed Catheter size: 20 Fr Hand hygiene performed  and maximum sterile barriers used   Attempts: 1 Procedure performed without using ultrasound guided technique. Following insertion, dressing applied and Biopatch. Post procedure assessment: normal and unchanged  Patient tolerated the procedure well with no immediate complications.

## 2020-04-27 NOTE — Sedation Documentation (Signed)
Bed control notified of Code Stroke at 1122

## 2020-04-27 NOTE — Progress Notes (Signed)
I updated the wife and daughter again and discussed the findings of the CTA and angiogram.  Currently the patient remains in the PACU.  He has regained movement in his right leg and was able to slightly grip my hand with his right hand, however it still remains flacid and he continues to had=ve expressive aphasia.  All questions answered.  Appreciate assistance from Dr. Leonie Man and the stroke team as well as Dr. Estanislado Pandy.  Annamarie Major

## 2020-04-27 NOTE — Op Note (Signed)
Patient name: John Lambert MRN: [id number] DOB: 12/07/1940 Sex: male  04/27/2020 Pre-operative Diagnosis: Symptomatic left carotid stenosis Post-operative diagnosis:  Same Surgeon:  Annamarie Major Assistants: Laurence Slate Procedure:   #1: Left TCAR (transcarotid artery stenting)   #2: Flow reversal neuro protection   #3: Ultrasound-guided access, left femoral vein Anesthesia: General Blood Loss: Minimal Specimens: None  Findings: 80% carotid stenosis, down to less than 10% post stenting  Pre-dil balloon:  6x30 Post dilation balloon:  7x30 Stent:  ENROUTE 10x40 Flow reversal time:  14 min Contrast: 40 Dose area:  1888 Flouro:  3.9 min Procedure time:  60 min  Indications: This is an 79 year old gentleman who presented to the hospital with a left brain stroke. He was found to have high-grade left carotid lesion. We discussed surgical versus stenting options for treatment and we have decided to proceed with stenting. The risks and benefits of the procedure were discussed with the patient and his family. All questions were answered.  Procedure:  The patient was identified in the holding area and taken to [place] [facility]  The patient was then placed supine on the table. general anesthesia was administered.  The patient was prepped and draped in the usual sterile fashion.  A time out was called and antibiotics were administered. A PA was necessary to expedite the procedure and assist with the technical details.  Ultrasound was used to evaluate the right common femoral vein. This did not compress with ultrasound. I suspect that this may have chronic thrombus within and so I elected to access the left common femoral vein. Under ultrasound, the vein was easily compressible. A digital image was acquired. The vein was then cannulated under ultrasound guidance with a micropuncture needle. An 018 wire was advanced without resistance followed by placement of a micropuncture sheath. A Bentson wire  was then inserted followed by the TCAR sheath  Next ultrasound was used to evaluate the carotid and the left neck. A transverse incision was made just above the clavicle. Cautery was used divide subcutaneous tissue and platysma muscle. Identified the sternal and clavicular heads of the sternocleidomastoid and developed a plane between the 2 of them. Ultimately I was able to identify the external jugular vein and reflected this laterally. The vagus nerve was also identified and protected. I then sharply dissected out the common carotid artery which was a disease-free artery. It was encircled proximally with an umbilical tape and a Potts vessel loop. The patient was then fully heparinized. The ACT came back at greater than 290. Next, a 5-0 Prolene was used to place a U stitch in the carotid adventitia. The carotid artery was then cannulated using a micropuncture needle. A 018 wire was advanced up to the mark and the needle was removed and a micropuncture sheath was inserted for 3 cm. A contrast injection was performed locating the carotid bifurcation. This was marked on the screen. A carotid Amplatz was then inserted up to just proximal to the mark. The TCAR sheath was then inserted. The dilator and wire were removed. The sheath was sutured into position with 2-0 silk sutures. Next, the flow reversal tubing was connected and passive flow reversal was confirmed with a saline flush. At this point a TCAR timeout was performed. ACT, blood pressure, and heart rate were in acceptable ranges. The 018 wire and a 6 x 30 predilatation balloon were inserted into the sheath. The vessel loop on the proximal left carotid artery was tightened. Active flow reversal  was engaged and confirmed by a saline flush. I then used the 018 wire to cross the lesion in the internal carotid artery. This was done without difficulty. The lesion was then predilated using a 6 x 30 balloon. I then inserted the stent this was a 10 x 40 ENROUTE. This  was successfully deployed. We waited 2 minutes. There did appear to be a waist within the stent and so I elected to post dilate this with a 7 x 30 balloon. I then waited another 2 minutes and then performed angiography. 2 views were obtained showing the stent to be widely patent without complicating features. Wire was then removed. Flow reversal was discontinued and blood was returned to the patient. The sheath was removed and the arteriotomy site closed by securing the previously placed 5-0 Prolene suture. Hand-held Doppler was used to interrogate the artery and had excellent signals. 50 mg of protamine was then given to reverse the heparin. The sheath in the groin was removed and manual pressure was held for 10 minutes until hemostatic. The neck incision was irrigated. Hemostasis was achieved. The platysma muscle was closed with 3-0 Vicryl and the skin was closed with 3-0 Vicryl followed by Dermabond.  The patient was very slow to awake from anesthesia. He was also having difficulty moving his right side. A code stroke was called immediately. The patient was taken to CT scan for further evaluation   Disposition: Neurologic deficit upon awakening from anesthesia. The patient went straight to CT scan   V. Annamarie Major, M.D., [facility] [place] Office: [phone] Pager:  [phone]

## 2020-04-27 NOTE — Sedation Documentation (Signed)
Patient transported to PACU with CRNA. Bedside report given to [facility]. Groin site assessed. Clean, dry and intact. Soft to palpation. No hematoma noted. No drainage or bleeding from dressing. Distal pulses intact via palpation. +2

## 2020-04-27 NOTE — Anesthesia Procedure Notes (Signed)
Procedure Name: Intubation Date/Time: 04/27/2020 8:28 AM Performed by: Kathryne Hitch, CRNA Pre-anesthesia Checklist: Patient identified, Emergency Drugs available, Suction available and Patient being monitored Patient Re-evaluated:Patient Re-evaluated prior to induction Oxygen Delivery Method: Circle system utilized Preoxygenation: Pre-oxygenation with 100% oxygen Induction Type: IV induction Ventilation: Mask ventilation without difficulty Laryngoscope Size: Miller and 3 Grade View: Grade I Tube type: Oral Tube size: 7.5 mm Number of attempts: 1 Airway Equipment and Method: Stylet and Oral airway Placement Confirmation: ETT inserted through vocal cords under direct vision,  positive ETCO2 and breath sounds checked- equal and bilateral Secured at: 23 cm Tube secured with: Tape Dental Injury: Teeth and Oropharynx as per pre-operative assessment

## 2020-04-27 NOTE — Interval H&P Note (Signed)
History and Physical Interval Note:  04/27/2020 7:30 AM  John Lambert  has presented today for surgery, with the diagnosis of LEFT CAROTID ARTERY STENOSIS.  The various methods of treatment have been discussed with the patient and family. After consideration of risks, benefits and other options for treatment, the patient has consented to  Procedure(s): LEFT TRANSCAROTID ARTERY REVASCULARIZATION (Left) as a surgical intervention.  The patient's history has been reviewed, patient examined, no change in status, stable for surgery.  I have reviewed the patient's chart and labs.  Questions were answered to the patient's satisfaction.     Annamarie Major

## 2020-04-27 NOTE — Procedures (Signed)
S/P Lt common carotid artreriogram  RT CFA approach. Findings. 1.Patent prox Lt ICA stent with no intraluminal filling defects. 2.No LVO  Or MeVO  Intracranially. S.Kamyrah Feeser MD

## 2020-04-27 NOTE — Sedation Documentation (Addendum)
No intervention per Dr. Estanislado Pandy. Patient to be extubated and taken to PACU. No beds currently in neuro ICU

## 2020-04-27 NOTE — Anesthesia Postprocedure Evaluation (Signed)
Anesthesia Post Note  Patient: John Lambert  Procedure(s) Performed: IR WITH ANESTHESIA (N/A )     Patient location during evaluation: PACU Anesthesia Type: General Level of consciousness: sedated and patient cooperative Pain management: pain level controlled Vital Signs Assessment: post-procedure vital signs reviewed and stable Respiratory status: spontaneous breathing Cardiovascular status: stable Anesthetic complications: no   No complications documented.  Last Vitals:  Vitals:   04/27/20 1600 04/27/20 1700  BP: 115/63 114/83  Pulse: 62 77  Resp: 14 17  Temp:    SpO2: 98% 96%    Last Pain:  Vitals:   04/27/20 1533  TempSrc: Axillary  PainSc:                  [name]

## 2020-04-28 ENCOUNTER — Inpatient Hospital Stay (HOSPITAL_COMMUNITY): Payer: Medicare Other

## 2020-04-28 DIAGNOSIS — Z20822 Contact with and (suspected) exposure to covid-19: Secondary | ICD-10-CM | POA: Diagnosis not present

## 2020-04-28 DIAGNOSIS — Z006 Encounter for examination for normal comparison and control in clinical research program: Secondary | ICD-10-CM | POA: Diagnosis not present

## 2020-04-28 DIAGNOSIS — I6340 Cerebral infarction due to embolism of unspecified cerebral artery: Secondary | ICD-10-CM | POA: Diagnosis not present

## 2020-04-28 DIAGNOSIS — J9601 Acute respiratory failure with hypoxia: Secondary | ICD-10-CM | POA: Diagnosis not present

## 2020-04-28 LAB — GLUCOSE, CAPILLARY
Glucose-Capillary: 101 mg/dL — ABNORMAL HIGH (ref 70–99)
Glucose-Capillary: 88 mg/dL (ref 70–99)
Glucose-Capillary: 88 mg/dL (ref 70–99)

## 2020-04-28 LAB — LIPID PANEL
Cholesterol: 104 mg/dL (ref 0–200)
HDL: 30 mg/dL — ABNORMAL LOW (ref >40)
LDL Cholesterol: 61 mg/dL (ref 0–99)
Total CHOL/HDL Ratio: 3.5 RATIO
Triglycerides: 66 mg/dL (ref <150)
VLDL: 13 mg/dL (ref 0–40)

## 2020-04-28 LAB — BASIC METABOLIC PANEL
Anion gap: 10 (ref 5–15)
BUN: 16 mg/dL (ref 8–23)
CO2: 22 mmol/L (ref 22–32)
Calcium: 8.2 mg/dL — ABNORMAL LOW (ref 8.9–10.3)
Chloride: 107 mmol/L (ref 98–111)
Creatinine, Ser: 0.77 mg/dL (ref 0.61–1.24)
GFR, Estimated: >60 mL/min (ref >60)
Glucose, Bld: 100 mg/dL — ABNORMAL HIGH (ref 70–99)
Potassium: 3.9 mmol/L (ref 3.5–5.1)
Sodium: 139 mmol/L (ref 135–145)

## 2020-04-28 LAB — PROTIME-INR
INR: 1.2 (ref 0.8–1.2)
Prothrombin Time: 14.5 seconds (ref 11.4–15.2)

## 2020-04-28 LAB — MAGNESIUM: Magnesium: 1.9 mg/dL (ref 1.7–2.4)

## 2020-04-28 LAB — HEMOGLOBIN A1C
Hgb A1c MFr Bld: 5.1 % (ref 4.8–5.6)
Mean Plasma Glucose: 99.67 mg/dL

## 2020-04-28 IMAGING — MR MR HEAD W/O CM
11 of 12 series · 43 of 48 positions shown · non-contrast
Comparison: [DATE].  MRI [DATE].

CLINICAL DATA: Left carotid stent placed for stenosis.

EXAM:
MRI HEAD WITHOUT CONTRAST
TECHNIQUE: Multiplanar, multiecho pulse sequences of the brain and surrounding
structures were obtained without intravenous contrast.

[Series 5: DWI · axial · 3.0mm · 0.88mm/px · z∈[-9,+118]mm · 7 of 96 slices shown (1 of 4)]
[im 1/96]
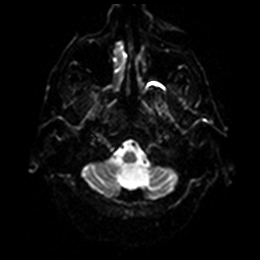
[im 16/96]
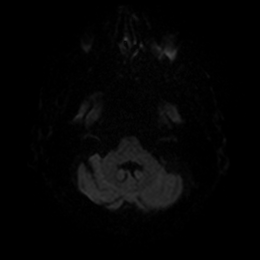
[im 32/96]
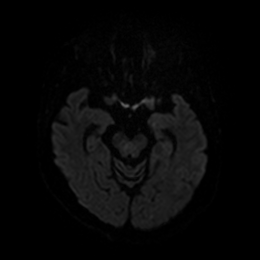
[im 48/96]
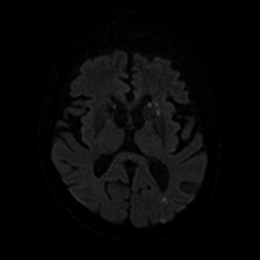
[im 64/96]
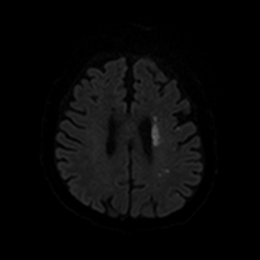
[im 80/96]
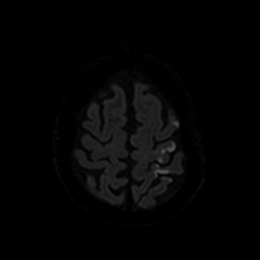
[im 96/96]
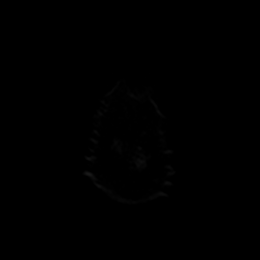

[Series 6: DWI · axial · 3.0mm · 0.88mm/px · z∈[-9,+118]mm · 3 of 48 slices shown (2 of 4)]
[im 1/48]
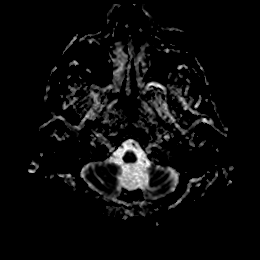
[im 24/48]
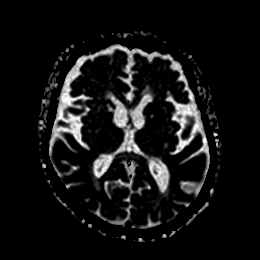
[im 48/48]
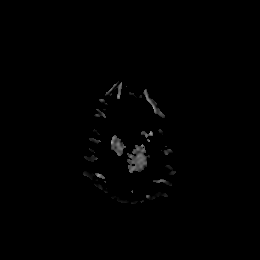

[Series 7: DWI · coronal · 4.0mm · 0.88mm/px · 6 of 64 slices shown (3 of 4)]
[im 1/64]
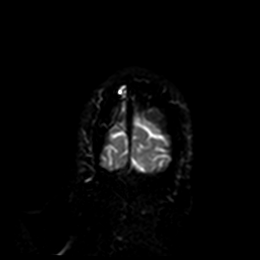
[im 13/64]
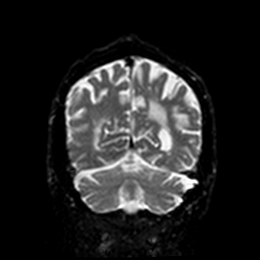
[im 26/64]
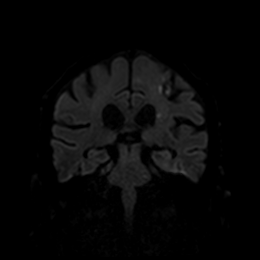
[im 38/64]
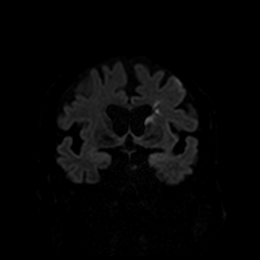
[im 51/64]
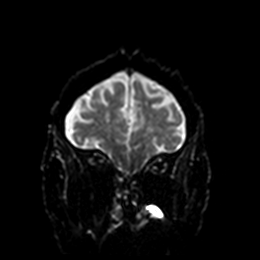
[im 64/64]
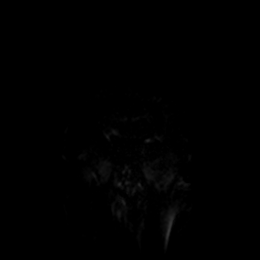

[Series 8: DWI · coronal · 4.0mm · 0.88mm/px · 3 of 32 slices shown (4 of 4)]
[im 1/32]
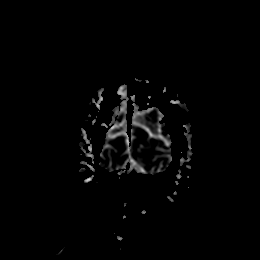
[im 16/32]
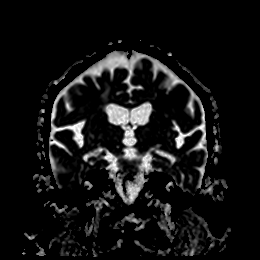
[im 32/32]
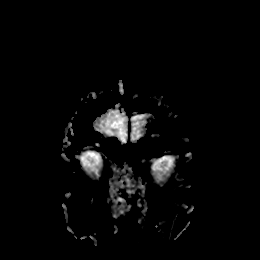

[Series 9: T1 · sagittal · 5.0mm · 0.75mm/px · 2 of 23 slices shown]
[im 1/23]
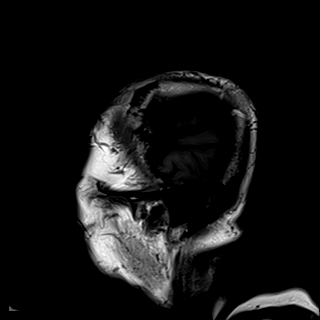
[im 23/23]
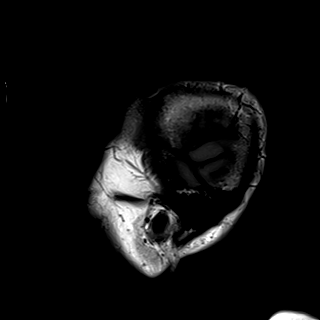

[Series 10: T2 · axial · 5.0mm · 0.72mm/px · z∈[-16,+116]mm · 2 of 25 slices shown (1 of 2)]
[im 1/25]
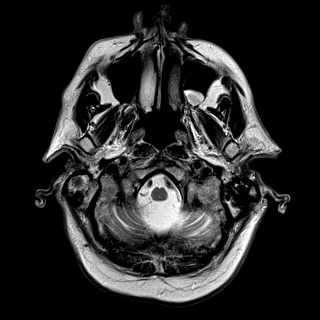
[im 25/25]
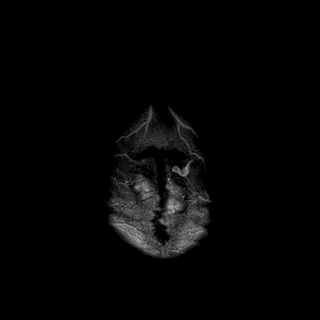

[Series 11: FLAIR · axial · 5.0mm · 0.45mm/px · z∈[-14,+118]mm · 2 of 25 slices shown]
[im 1/25]
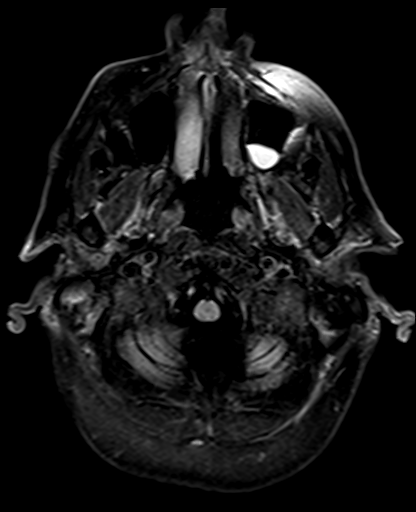
[im 25/25]
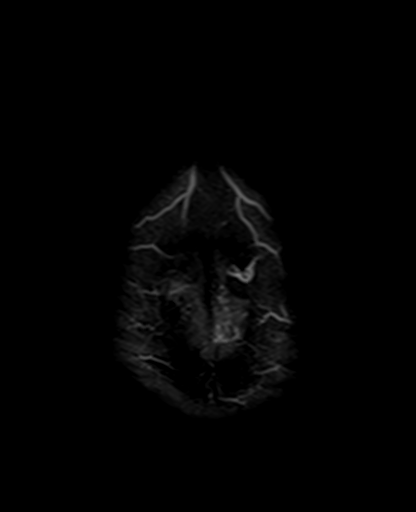

[Series 12: mag_images · axial · 3.0mm · 0.90mm/px · z∈[-12,+125]mm · 5 of 52 slices shown]
[im 1/52]
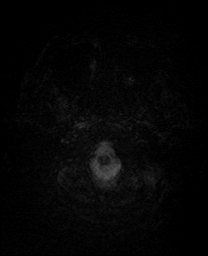
[im 13/52]
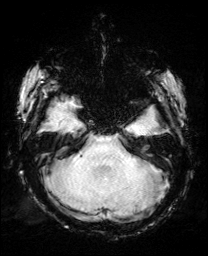
[im 26/52]
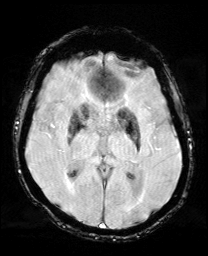
[im 39/52]
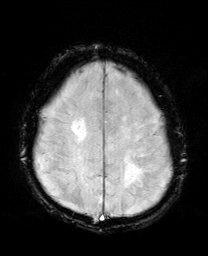
[im 52/52]
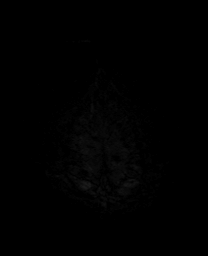

[Series 13: pha_images · axial · 3.0mm · 0.90mm/px · z∈[-12,+125]mm · 5 of 52 slices shown]
[im 1/52]
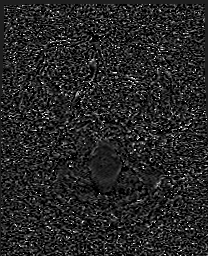
[im 13/52]
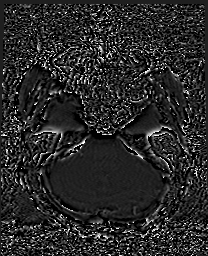
[im 26/52]
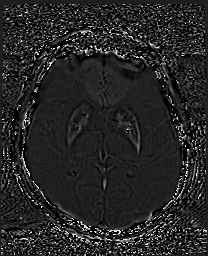
[im 39/52]
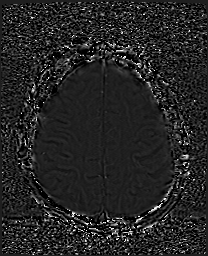
[im 52/52]
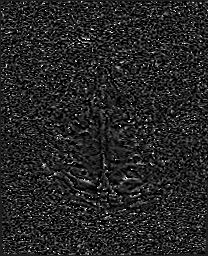

[Series 14: swi_images · axial · 3.0mm · 0.90mm/px · z∈[-12,+125]mm · 5 of 52 slices shown]
[im 1/52]
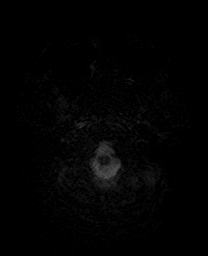
[im 13/52]
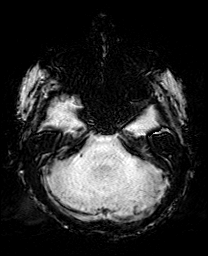
[im 26/52]
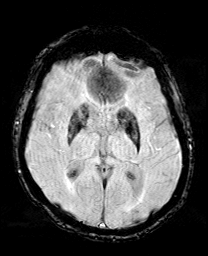
[im 39/52]
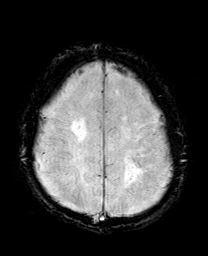
[im 52/52]
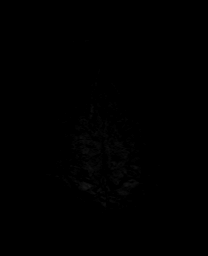

[Series 17: T2 · coronal · 5.0mm · 0.34mm/px · 3 of 29 slices shown (2 of 2)]
[im 1/29]
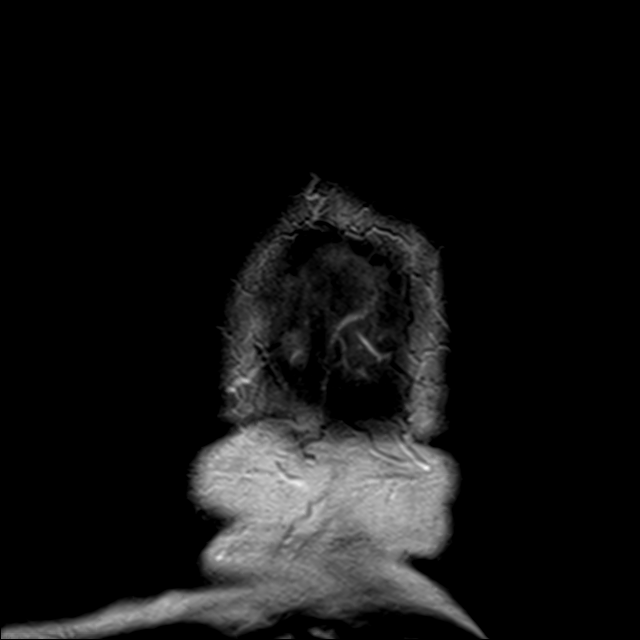
[im 15/29]
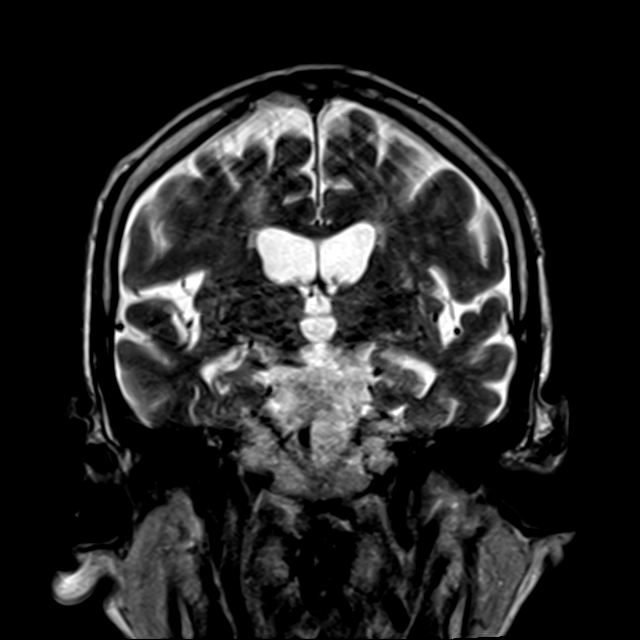
[im 29/29]
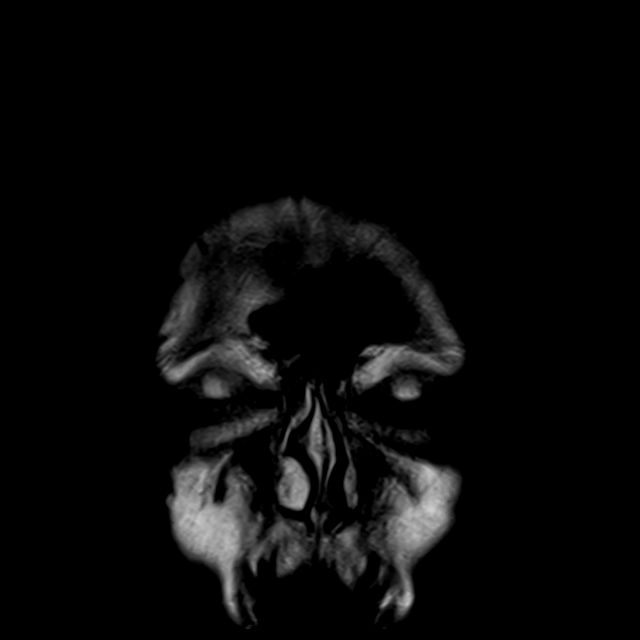

[43 of 48 positions shown; findings below may reference images not displayed]

FINDINGS: Brain: No acute finding affects the brainstem or cerebellum. Few old
small vessel cerebellar infarctions on the left. Old small vessel
infarctions of the pons.

Right cerebral hemisphere shows atrophy with chronic small-vessel
ischemic changes of the white matter. Old small right frontal vertex
cortical and subcortical infarction.

Left hemisphere shows areas of subacute infarction at the medial
left frontoparietal vertex. New areas of acute infarction are
present scattered within the basal ganglia and radiating white
matter tracts and within the left frontoparietal cortical and
subcortical brain. No large confluent infarction. No swelling or
hemorrhage. Chronic small-vessel ischemic changes seen elsewhere.

No hydrocephalus or extra-axial collection.

Vascular: Major vessels at the base of the brain show flow.

Skull and upper cervical spine: Negative

Sinuses/Orbits: Clear/normal

Other: None
IMPRESSION: 1. New areas of acute infarction are present scattered within the
left basal ganglia and radiating white matter tracts and within the
left frontoparietal cortical and subcortical brain. No large
confluent infarction. No swelling or hemorrhage.
2. Subacute infarction in the left medial frontoparietal vertex
undergoing expected evolutionary changes.
3. Chronic small-vessel ischemic changes elsewhere throughout the
brain as outlined above.

## 2020-04-28 MED ORDER — WARFARIN SODIUM 2.5 MG PO TABS
2.5000 mg | ORAL_TABLET | Freq: Once | ORAL | Status: AC
  Administered 2020-04-28: 18:00:00 2.5 mg via ORAL
  Filled 2020-04-28: qty 1

## 2020-04-28 MED ORDER — ASPIRIN 81 MG PO CHEW
81.0000 mg | CHEWABLE_TABLET | Freq: Every day | ORAL | Status: DC
  Administered 2020-04-28 – 2020-05-03 (×6): 81 mg via ORAL
  Filled 2020-04-28 (×6): qty 1

## 2020-04-28 MED ORDER — WARFARIN - PHARMACIST DOSING INPATIENT: Freq: Every day | Status: DC

## 2020-04-28 MED ORDER — DEXTROSE-NACL 5-0.9 % IV SOLN: INTRAVENOUS | Status: DC

## 2020-04-28 NOTE — Progress Notes (Signed)
ANTICOAGULATION CONSULT NOTE - Initial Consult  Pharmacy Consult for enoxaparin, warfarin Indication: on PTA warfarin for hx of DVT/PE   Allergies  Allergen Reactions  . Hctz [Hydrochlorothiazide] Other (See Comments)    dizzy  . Hydrocodone Itching  . Vicodin [Hydrocodone-Acetaminophen] Itching    Patient Measurements: Height: 6\' 2"  (188 cm) Weight: 79.7 kg (175 lb 11.3 oz) IBW/kg (Calculated) : 82.2  Vital Signs: Temp: 98.1 F (36.7 C) (12/23 1200) Temp Source: Axillary (12/23 1200) BP: 117/69 (12/23 0700) Pulse Rate: 85 (12/23 0700)  Labs: Recent Labs    04/26/20 0210 04/26/20 2108 04/27/20 0253 04/28/20 0500  HGB 15.3 16.2 15.9  --   HCT 45.0 49.2 46.9  --   PLT 216 224 226  --   APTT  --  39*  --   --   LABPROT 18.7* 15.9* 15.4* 14.5  INR 1.6* 1.3* 1.3* 1.2  CREATININE 0.77 0.87 0.91  --     Estimated Creatinine Clearance: 71.8 mL/min (by C-G formula based on SCr of 0.91 mg/dL).  Assessment: 79 yo male with L symptomatic carotid stenosis.    Already on treatment enox  PTA warfarin dose: 2.5mg  daily except 1.25mg  on Mondays  INR 1.2  Goal of Therapy:  Will not plan to monitor anti-Xa levels at this time Monitor platelets by anticoagulation protocol: Yes   Plan:  Lovenox  80 mg/kg q12hours Warfarin 2.5 mg x 1 Monitor daily CBC and INR Triple therapy x 1 month  Barth Kirks, PharmD, BCPS, BCCCP Clinical Pharmacist [phone]  Please check AMION for all [facility] numbers  04/28/2020 1:48 PM

## 2020-04-28 NOTE — Evaluation (Addendum)
Clinical/Bedside Swallow Evaluation Patient Details  Name: John Lambert MRN: [id number] Date of Birth: 1940-08-20  Today's Date: 04/28/2020 Time: SLP Start Time (ACUTE ONLY): [id number] SLP Stop Time (ACUTE ONLY): 0930 SLP Time Calculation (min) (ACUTE ONLY): 13 min  Past Medical History:  Past Medical History:  Diagnosis Date  . Blood clotting disorder ([place])   . Blood dyscrasia   . Cancer ([place])    melanoma removed from right calf  . Hypertension   . Neuromuscular disorder ([place])    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   Past Surgical History:  Past Surgical History:  Procedure Laterality Date  . IR ANGIO INTRA EXTRACRAN SEL COM CAROTID INNOMINATE UNI L MOD SED  04/27/2020  . RADIOLOGY WITH ANESTHESIA N/A 04/27/2020   Procedure: IR WITH ANESTHESIA;  Surgeon: Radiologist, Medication, MD;  Location: [facility];  Service: Radiology;  Laterality: N/A;  . SKIN BIOPSY    . TRANSCAROTID ARTERY REVASCULARIZATION Left 04/27/2020   Procedure: LEFT TRANSCAROTID ARTERY REVASCULARIZATION;  Surgeon: Serafina Mitchell, MD;  Location: [facility];  Service: Vascular;  Laterality: Left;  . ULTRASOUND GUIDANCE FOR VASCULAR ACCESS Left 04/27/2020   Procedure: ULTRASOUND GUIDANCE FOR VASCULAR ACCESS;  Surgeon: Serafina Mitchell, MD;  Location: [facility] OR;  Service: Vascular;  Laterality: Left;   HPI:  Pt is an 79 y.o. 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 He also has pain in left upper leg/hip region, and R foot drag; Imaging notable for Numerous scattered punctate acute infarctions in the left  posterior frontal region consistent with micro embolic infarctions  which could either be in the anterior or middle cerebral artery  territory. Punctate acute infarction also affecting the splenium of  the corpus callosum. Pt was seen by SLP on 12/17 for a BSE and SLE  with no subsequent need for SLP services. Left carotid TCAR procedure with stent placement conducted on 12/22. Following the procedure he was found to be aphasic unable to speak with left gaze deviation and right hemiparesis. Emergent CTA showed patent left carotid stent with good flow in the terminal left ICA as well as middle cerebral artery in the M1 segment. There appeared to be decreased flow in one of the M2 branches by neurology. SLP was consulted due to pt failing the [place].   Assessment / Plan / Recommendation Clinical Impression  Pt was seen for bedside swallow evaluation. Pt was asymptomatic of oropharyngeal dysphagia during the bedside swallow evaluation 12/17. A complete oral mechanism exam was not conducted due to pt's difficulty following commands. However, he demonstrated reduced lingual strength and ROM with lingual deviation to the right on protrusion and right-sided facial weakness was noted. Dentition was adequate but in poor condition. No s/sx of aspiration were noted with solids, but he exhibited 3-5 secondary swallows with 1/2 tsp boluses of puree solids, suggesting pharyngeal residue. Pt exhibited throat clearing and delayed coughing with thin liquids via tsp and cup. A modified barium swallow study is recommended to further assess swallow function; however, it cannot be conducted today due to a scheduling conflict with radiology. Critical meds may be given crushed in 1/2 tsp applesauce and SLP will follow to assess improvement in swallow function and need for short-term non-oral alimentation. Results and recommendations were discussed with Dr. Leonie Man, April, RN, and Burnetta Sabin, NP. Ivin Booty indicated that the pt only has  five meds which need to given today and only one of those medications needs to be given twice. It was agreed that they be given p.o. with observance of swallowing precautions instead of placing an NGT today with the potential need/preference for it to be  replaced on 12/23 with a Cortrak. April, RN was advised of recommendations and verbalized understanding but expressed discomfort. SLP offered to come observe the pt today when he is receiving p.o. meds, but was informed that he was about to leave the unit for MRI.    SLP Visit Diagnosis: Dysphagia, unspecified (R13.10)    Aspiration Risk  Mild aspiration risk    Diet Recommendation NPO   Medication Administration: Crushed with puree    Other  Recommendations Oral Care Recommendations: Oral care QID   Follow up Recommendations None      Frequency and Duration min 2x/week  2 weeks       Prognosis Prognosis for Safe Diet Advancement: Good Barriers to Reach Goals: Severity of deficits      Swallow Study   General Date of Onset: 04/21/20 HPI: Pt is an 79 y.o. 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 He also has pain in left upper leg/hip region, and R foot drag; Imaging notable for Numerous scattered punctate acute infarctions in the left  posterior frontal region consistent with micro embolic infarctions  which could either be in the anterior or middle cerebral artery  territory. Punctate acute infarction also affecting the splenium of  the corpus callosum. Pt was seen by SLP on 12/17 for a BSE and SLE with no subsequent need for SLP services. Left carotid TCAR procedure with stent placement conducted on 12/22. Following the procedure he was found to be aphasic unable to speak with left gaze deviation and right hemiparesis. Emergent CTA showed patent left carotid stent with good flow in the terminal left ICA as well as middle cerebral artery in the M1 segment. There appeared to be decreased flow in one of the M2 branches by neurology. SLP was consulted due to pt failing the [place]. Type of Study: Bedside  Swallow Evaluation Previous Swallow Assessment: None Diet Prior to this Study: Regular;Thin liquids Respiratory Status: Room air History of Recent Intubation: No Behavior/Cognition: Alert;Cooperative;Pleasant mood Oral Cavity Assessment: Within Functional Limits Oral Care Completed by SLP: Yes Oral Cavity - Dentition: Adequate natural dentition;Poor condition Vision: Functional for self-feeding Self-Feeding Abilities: Total assist Patient Positioning: Upright in bed;Postural control adequate for testing Baseline Vocal Quality: Normal Volitional Cough: Strong Volitional Swallow: Able to elicit    Oral/Motor/Sensory Function Overall Oral Motor/Sensory Function: Mild impairment (mild-moderate) Facial ROM: Reduced right;Suspected CN VII (facial) dysfunction Facial Symmetry: Abnormal symmetry right;Suspected CN VII (facial) dysfunction Facial Strength: Reduced right;Suspected CN VII (facial) dysfunction Facial Sensation: Within Functional Limits Lingual ROM: Reduced right;Suspected CN XII (hypoglossal) dysfunction Lingual Symmetry: Abnormal symmetry right;Suspected CN XII (hypoglossal) dysfunction Lingual Strength: Reduced;Suspected CN XII (hypoglossal) dysfunction Lingual Sensation: Within Functional Limits Mandible: Within Functional Limits   Ice Chips Ice chips: Impaired Presentation: Spoon Pharyngeal Phase Impairments: Throat Clearing - Immediate   Thin Liquid Thin Liquid: Impaired Presentation: Cup;Spoon Pharyngeal  Phase Impairments: Cough - Delayed    Nectar Thick Nectar Thick Liquid: Not tested   Honey Thick Honey Thick Liquid: Not tested   Puree Puree: Impaired Presentation: Spoon Pharyngeal Phase Impairments: Multiple swallows (  3-5 swallows per 1/2 tsp)   Solid    Bedie Dominey I. Hardin Negus, [place], [place] Office number [phone] Pager [phone] Solid: Not tested      Horton Marshall 04/28/2020,11:01 AM

## 2020-04-28 NOTE — Progress Notes (Signed)
  IR procedure 12/22:  S/P Lt common carotid artreriogram  RT CFA approach. Findings. 1.Patent prox Lt ICA stent with no intraluminal filling defects. 2.No LVO  Or MeVO  Intracranially. S.Deveshwar MD  Rt groin site is clean and dry No hematoma Soft NT per pt Rt foot 1+ pulses

## 2020-04-28 NOTE — Evaluation (Signed)
Speech Language Pathology Evaluation Patient Details Name: John Lambert MRN: [id number] DOB: 1940-10-18 Today's Date: 04/28/2020 Time: [id number] SLP Time Calculation (min) (ACUTE ONLY): 13 min  Problem List:  Patient Active Problem List   Diagnosis Date Noted  . Middle cerebral artery embolism, left 04/27/2020  . Cerebral embolism with cerebral infarction 04/21/2020  . Stroke ([place]) 04/21/2020  . Anticoagulated on warfarin 04/20/2020  . Impaired ambulation 04/20/2020  . Fall at home, initial encounter 04/20/2020  . Essential hypertension 04/20/2020  . Blood clotting disorder ([place]) 04/20/2020  . Weakness 04/19/2020   Past Medical History:  Past Medical History:  Diagnosis Date  . Blood clotting disorder ([place])   . Blood dyscrasia   . Cancer ([place])    melanoma removed from right calf  . Hypertension   . Neuromuscular disorder ([place])    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   Past Surgical History:  Past Surgical History:  Procedure Laterality Date  . IR ANGIO INTRA EXTRACRAN SEL COM CAROTID INNOMINATE UNI L MOD SED  04/27/2020  . RADIOLOGY WITH ANESTHESIA N/A 04/27/2020   Procedure: IR WITH ANESTHESIA;  Surgeon: Radiologist, Medication, MD;  Location: [place];  Service: Radiology;  Laterality: N/A;  . SKIN BIOPSY    . TRANSCAROTID ARTERY REVASCULARIZATION Left 04/27/2020   Procedure: LEFT TRANSCAROTID ARTERY REVASCULARIZATION;  Surgeon: Serafina Mitchell, MD;  Location: [place];  Service: Vascular;  Laterality: Left;  . ULTRASOUND GUIDANCE FOR VASCULAR ACCESS Left 04/27/2020   Procedure: ULTRASOUND GUIDANCE FOR VASCULAR ACCESS;  Surgeon: Serafina Mitchell, MD;  Location: [facility] OR;  Service: Vascular;  Laterality: Left;   HPI:  Pt is an 79 y.o. 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 He also has pain in left upper leg/hip  region, and R foot drag; Imaging notable for Numerous scattered punctate acute infarctions in the left  posterior frontal region consistent with micro embolic infarctions  which could either be in the anterior or middle cerebral artery  territory. Punctate acute infarction also affecting the splenium of  the corpus callosum. Pt was seen by SLP on 12/17 for a BSE and SLE with no subsequent need for SLP services. Left carotid TCAR procedure with stent placement conducted on 12/22. Following the procedure he was found to be aphasic unable to speak with left gaze deviation and right hemiparesis. Emergent CTA showed patent left carotid stent with good flow in the terminal left ICA as well as middle cerebral artery in the M1 segment. There appeared to be decreased flow in one of the M2 branches by neurology. SLP was consulted due to pt failing the [employer].   Assessment / Plan / Recommendation Clinical Impression  Pt presents with moderate to severe non-fluent aphasia characterized by impairments in receptive and expressive language with the more significant difficulty with verbal expression. He was able to follow 1-step commands and answer simple yes/no questions using gestures. However, he exhibited difficulty with 2-step commands and his reliability with complex yes/no questions significantly reduced. No verbal output was noted during the evaluation and he gestured yes/no with in response to all questions. He occasionally demonstrated some attempts to speak but then shook head and indicated that he could not respond to the questions. No significant groping movements were observed, but SLP will continue assessment of motor speech function to rule out apraxia of speech. Skilled SLP  services are clinically indicated at this time to target aphasia.    SLP Assessment  SLP Recommendation/Assessment: Patient needs continued Speech Lanaguage Pathology Services SLP Visit Diagnosis: Aphasia (R47.01)     Follow Up Recommendations  Inpatient Rehab    Frequency and Duration min 2x/week  2 weeks      SLP Evaluation Cognition  Overall Cognitive Status: Difficult to assess (Due to pt's absent verbal output) Orientation Level: Disoriented X4;Other (comment) (mute)       Comprehension  Auditory Comprehension Overall Auditory Comprehension: Impaired Yes/No Questions: Impaired Basic Immediate Environment Questions:  (4/5) Complex Questions:  (1/5) Commands: Impaired One Step Basic Commands:  (4/5) Two Step Basic Commands:  (3/4)    Expression Expression Primary Mode of Expression: Verbal Verbal Expression Overall Verbal Expression: Impaired Initiation: Impaired Automatic Speech: Counting (0/10) Level of Generative/Spontaneous Verbalization:  (None observed) Repetition: Impaired Level of Impairment: Word level Naming: Impairment Confrontation:  (0/5)   Oral / Motor  Oral Motor/Sensory Function Overall Oral Motor/Sensory Function: Mild impairment (mild-moderate) Facial ROM: Reduced right;Suspected CN VII (facial) dysfunction Facial Symmetry: Abnormal symmetry right;Suspected CN VII (facial) dysfunction Facial Strength: Reduced right;Suspected CN VII (facial) dysfunction Facial Sensation: Within Functional Limits Lingual ROM: Reduced right;Suspected CN XII (hypoglossal) dysfunction Lingual Symmetry: Abnormal symmetry right;Suspected CN XII (hypoglossal) dysfunction Lingual Strength: Reduced;Suspected CN XII (hypoglossal) dysfunction Lingual Sensation: Within Functional Limits Mandible: Within Functional Limits Motor Speech Overall Motor Speech:  Pincus Badder)   Synthia Fairbank I. Hardin Negus, [place], [place] Office number [phone] Pager [place] 04/28/2020, 11:13 AM

## 2020-04-28 NOTE — Progress Notes (Signed)
Inpatient Rehabilitation-Admissions Coordinator   Pre-auth IP Rehab case was open by my coworker Danne Baxter on 1/21 for possible admit. Still waiting on a determination. Noted events since surgery. Pt on Cleviprex and not yet medically ready for CIR. Will continue to follow for therapy reassessments, insurance auth, and medical readiness.   Will follow.   Raechel Ache, OTR/L  Rehab Admissions Coordinator  [phone] 04/28/2020 1:29 PM

## 2020-04-28 NOTE — Progress Notes (Signed)
STROKE TEAM PROGRESS NOTE   INTERVAL HISTORY His wife and daughter are  at the bedside. Patient underwent elective left carotid TCAR procedure with stenting which was complicated by postprocedure stroke. CT angiogram and emergent cerebral catheter angiogram did not show any LVO. He continues to have severe expressive aphasia and is nonverbal but able to comprehend well. He remains significantly weak in the right upper extremity but is able to move the right lower extremity against gravity. EEG shows focal left frontotemporal slowing without epileptiform activity. MRI scan of the brain shows patchy left basal ganglia as well as left frontoparietal cortical and subcortical infarcts which are new in addition to the previous recent left MCA medial frontal infarct which appears subacute. There is no hemorrhagic transformation Speech therapy has seen him in clearing only for dysphagia 1 pured diet with crushed medicines Vitals:   04/28/20 0500 04/28/20 0600 04/28/20 0700 04/28/20 0754  BP: (!) 129/58 (!) 117/59 117/69   Pulse: 75 65 85   Resp: 17 13 18    Temp:    98.1 F (36.7 C)  TempSrc:    Axillary  SpO2: 98% 99% 99%   Weight:      Height:       CBC:  Recent Labs  Lab 04/26/20 2108 04/27/20 0253  WBC 6.9 6.8  HGB 16.2 15.9  HCT 49.2 46.9  MCV 89.6 91.1  PLT 224 [id number]   Basic Metabolic Panel:  Recent Labs  Lab 04/26/20 0210 04/26/20 2108 04/27/20 0253  NA 138 137 141  K 3.8 4.1 4.0  CL 105 104 105  CO2 22 24 26   GLUCOSE 96 113* 98  BUN 16 15 13   CREATININE 0.77 0.87 0.91  CALCIUM 8.3* 8.6* 8.7*  MG 1.9  --  2.1  PHOS 3.6  --  3.1   Lipid Panel:  Recent Labs  Lab 04/28/20 0135  CHOL 104  TRIG 66  HDL 30*  CHOLHDL 3.5  VLDL 13  LDLCALC 61   HgbA1c:  Recent Labs  Lab 04/28/20 0135  HGBA1C 5.1   Urine Drug Screen: No results for input(s): LABOPIA, COCAINSCRNUR, LABBENZ, AMPHETMU, THCU, LABBARB in the last 168 hours.  Alcohol Level No results for input(s): ETH in  the last 168 hours.  IMAGING past 24 hours CT Code Stroke CTA Head W/WO contrast  Result Date: 04/27/2020 CLINICAL DATA:  Flaccid right-side EXAM: CT ANGIOGRAPHY HEAD AND NECK TECHNIQUE: Multidetector CT imaging of the head and neck was performed using the standard protocol during bolus administration of intravenous contrast. Multiplanar CT image reconstructions and MIPs were obtained to evaluate the vascular anatomy. Carotid stenosis measurements (when applicable) are obtained utilizing NASCET criteria, using the distal internal carotid diameter as the denominator. CONTRAST:  53mL OMNIPAQUE IOHEXOL 350 MG/ML SOLN COMPARISON:  Head CT from earlier today. CTA of the head neck from 7 days ago FINDINGS: CTA NECK FINDINGS Aortic arch: Atherosclerosis with 3 vessel branching. Right carotid system: Limited atheromatous changes. No stenosis or ulceration. Left carotid system: Trans carotid revascularization with stent extending from distal common to proximal internal carotid. There is related adjacent soft tissue gas from the surgical access. Wasting of the stent which measures 50% stenosis compared to the more distal stent, per coronal reformats. No ulceration, dissection, or thrombosis seen. Vertebral arteries: No proximal subclavian stenosis. The vertebral arteries are diffusely patent Skeleton: Ordinary degenerative and spondylitic spurring. Other neck: Soft tissue gas related to surgical access. There is medial ization of the left vocal fold. Upper chest:  Mild reticulation in the upper lungs with stability favoring scarring. Review of the MIP images confirms the above findings CTA HEAD FINDINGS Anterior circulation: Carotid siphon atheromatous calcification. Aplastic right A1 segment. No major branch occlusion, beading, or aneurysm. Mild atheromatous irregularity to bilateral medium size vessels. Posterior circulation: Mild left vertebral artery dominance. Generalized atheromatous irregularity of the vertebral  and basilar arteries with 40% stenosis of the left V4 segment as measured on coronal reformats. Fetal type right PCA. No branch occlusion, beading, or aneurysm Venous sinuses: Unremarkable in the arterial phase Anatomic variants: As above Review of the MIP images confirms the above findings IMPRESSION: 1. Left trans carotid revascularization. The stent is patent with no visible embolic disease to the major intracranial vessels. There is wasting of the stent at the level of treated plaque, measuring approximately 50%. 2. Moderate left V4 segment stenosis. 3. Medial ization of the left vocal fold, please correlate for clinical signs of paresis. Electronically Signed   By: Monte Fantasia M.D.   On: 04/27/2020 10:46   CT Code Stroke CTA Neck W/WO contrast  Result Date: 04/27/2020 CLINICAL DATA:  Flaccid right-side EXAM: CT ANGIOGRAPHY HEAD AND NECK TECHNIQUE: Multidetector CT imaging of the head and neck was performed using the standard protocol during bolus administration of intravenous contrast. Multiplanar CT image reconstructions and MIPs were obtained to evaluate the vascular anatomy. Carotid stenosis measurements (when applicable) are obtained utilizing NASCET criteria, using the distal internal carotid diameter as the denominator. CONTRAST:  59mL OMNIPAQUE IOHEXOL 350 MG/ML SOLN COMPARISON:  Head CT from earlier today. CTA of the head neck from 7 days ago FINDINGS: CTA NECK FINDINGS Aortic arch: Atherosclerosis with 3 vessel branching. Right carotid system: Limited atheromatous changes. No stenosis or ulceration. Left carotid system: Trans carotid revascularization with stent extending from distal common to proximal internal carotid. There is related adjacent soft tissue gas from the surgical access. Wasting of the stent which measures 50% stenosis compared to the more distal stent, per coronal reformats. No ulceration, dissection, or thrombosis seen. Vertebral arteries: No proximal subclavian stenosis. The  vertebral arteries are diffusely patent Skeleton: Ordinary degenerative and spondylitic spurring. Other neck: Soft tissue gas related to surgical access. There is medial ization of the left vocal fold. Upper chest: Mild reticulation in the upper lungs with stability favoring scarring. Review of the MIP images confirms the above findings CTA HEAD FINDINGS Anterior circulation: Carotid siphon atheromatous calcification. Aplastic right A1 segment. No major branch occlusion, beading, or aneurysm. Mild atheromatous irregularity to bilateral medium size vessels. Posterior circulation: Mild left vertebral artery dominance. Generalized atheromatous irregularity of the vertebral and basilar arteries with 40% stenosis of the left V4 segment as measured on coronal reformats. Fetal type right PCA. No branch occlusion, beading, or aneurysm Venous sinuses: Unremarkable in the arterial phase Anatomic variants: As above Review of the MIP images confirms the above findings IMPRESSION: 1. Left trans carotid revascularization. The stent is patent with no visible embolic disease to the major intracranial vessels. There is wasting of the stent at the level of treated plaque, measuring approximately 50%. 2. Moderate left V4 segment stenosis. 3. Medial ization of the left vocal fold, please correlate for clinical signs of paresis. Electronically Signed   By: Monte Fantasia M.D.   On: 04/27/2020 10:46   EEG adult  Result Date: 04/27/2020 Lora Havens, MD     04/27/2020  5:46 PM Patient Name: John Lambert MRN: [id number] Epilepsy Attending: Lora Havens Referring Physician/Provider: Dr Mamie Nick  Iosefa Weintraub Date: 04/27/2020 Duration: 25.57 mins Patient history: 79 year old Caucasian male with sudden onset of expressive aphasia left gaze deviation and right hemiplegia. EEG to evaluate for seizure Level of alertness: Awake, drowsy, sleep, comatose, lethargic AEDs during EEG study: None Technical aspects: This EEG study was done with  scalp electrodes positioned according to the 10-20 International system of electrode placement. Electrical activity was acquired at a sampling rate of 500Hz  and reviewed with a high frequency filter of 70Hz  and a low frequency filter of 1Hz . EEG data were recorded continuously and digitally stored. Description: The posterior dominant rhythm consists of 8 Hz activity of moderate voltage (25-35 uV) seen predominantly in posterior head regions, symmetric and reactive to eye opening and eye closing. EEG showed continuous generalized 3 to 6 Hz theta-delta slowing in left hemisphere, maximal left frontotemporal region, which at times appears sharply contoured. Hyperventilation and photic stimulation were not performed. ABNORMALITY -Continuous slow, left hemisphere, maximal left frontotemporal region IMPRESSION: This study is suggestive of cortical dysfunction in left hemisphere, maximal left fronto-temporal region likely secondary to underlying stroke/structural abnormality. No seizures or epileptiform discharges were seen throughout the recording. Priyanka O Yadav   IR ANGIO INTRA EXTRACRAN SEL COM CAROTID INNOMINATE UNI L MOD SED  Result Date: 04/28/2020 CLINICAL DATA:  Onset of left-sided gaze deviation and right-sided weakness arm greater than leg with speech difficulties post surgery. EXAM: IR ANGIO INTRA EXTRACRAN SEL COM CAROTID INNOMINATE UNI LEFT MOD SED COMPARISON:  CT angiogram of the head and neck of April 27, 2020, and April 20, 2020. MEDICATIONS: Heparin 1000 units IV. No antibiotic was administered within 1 hour of the procedure. ANESTHESIA/SEDATION: General anesthesia. CONTRAST:  Isovue 300 approximately 45 mL FLUOROSCOPY TIME:  Fluoroscopy Time: 4 minutes 18 seconds (427.1 mGy). COMPLICATIONS: None immediate. TECHNIQUE: Informed written consent was obtained from the patient after a thorough discussion of the procedural risks, benefits and alternatives. All questions were addressed. Maximal  Sterile Barrier Technique was utilized including caps, mask, sterile gowns, sterile gloves, sterile drape, hand hygiene and skin antiseptic. A timeout was performed prior to the initiation of the procedure. The right groin was prepped and draped in the usual sterile fashion. Thereafter using modified Seldinger technique, transfemoral access into the right common femoral artery was obtained without difficulty. Over a 0.035 inch guidewire, a 5 French Pinnacle sheath was inserted. Through this, and also over 0.035 inch guidewire, a 5 [place] JB 1 catheter was advanced to the aortic arch region and selectively positioned in left common carotid artery. FINDINGS:.: FINDINGS:. The left common carotid arteriogram demonstrates the left external carotid artery and its major branches to be widely patent. The stent is seen positioned in the proximal left internal carotid artery extending into the distal left common carotid artery. The proximal portion of the stent at the level of the carotid bifurcation demonstrates step-off anteriorly and posteriorly suggestive of partial fracture. There is approximately 30% to 40% narrowing in the proximal portion of the stented segment of the ICA by the NASCET criteria, on the AP projection. More distally the vessel is seen to opacify to the cranial skull base. The petrous, the cavernous and the supraclinoid segments are widely patent. The left middle cerebral artery and the left anterior cerebral artery opacify into the capillary and venous phases. No angiographic evidence of intraluminal filling defects or of occlusions are seen into the more tertiary branches of the left MCA distribution. Also seen is prompt opacification via the anterior communicating artery of the right anterior cerebral  A2 segment and distally. IMPRESSION: Approximately 30-40% stenosis within the stented segment of the left ICA proximally on the AP projection. Intracranially no evidence of intraluminal filling defects,  occlusions, stenosis or of dissections. PLAN: Patient was extubated successfully and returned to the PACU. Electronically Signed   By: Luanne Bras M.D.   On: 04/27/2020 14:15    PHYSICAL EXAM Pleasant elderly Caucasian male sitting up in bed. Not in distress. Surgical wound from TCAR procedure appears to be healing well without any bleeding from the left neck. . Afebrile. Head is nontraumatic. Neck is supple without bruit.    Cardiac exam no murmur or gallop. Lungs are clear to auscultation. Distal pulses are well felt. Neurological Exam :  He is awake alert severe expressive aphasia with anarthria but good comprehension and follows 1 and occasionally two-step commands. Left gaze preference and is unable to look to the right past midline. Blinks to threat on the left but not on the right. Right lower facial weakness. Tongue midline. Right hemi-plegia with right upper extremity 0/5 strength right lower extremity 2-3/5 strength. Good strength on the left. Gait not tested ASSESSMENT/PLAN Mr. CHARELS STAMBAUGH is a 79 y.o. male with history of L hemispheric infarcts secondary to symptomatic 80% left proximal carotid stenosis with a soft plaque on long-term AC for blood clotting disorder who underwent L carotid TCAR 04/27/2020. In PACU Post op developed aphasia w/ L gaze and R hemiparesis. Taken for emergent cerebral angio after CT, CTA neg for occlusion.   Stroke:  scattered L ACA and MCA / ACA territory infarcts most likely thrombembolic d/t severe L ICA stenosis followed by elective left carotid TCAR procedure and stenting and procedure complicated by embolic left MCA branch infarcts  MRI  Numerous scattered punctate L posterior frontal infarct w/ microembolic infarcts ACA/MCA territory and Punctate infarct corpus callosum. Old L parietal cortical and subcortical infarcts.  CTA head & neck ulcerated L ICA bifurcation w/ severe proximal 80% stenosis. Moderate intradural L VA stenosis. RUL w/ ground  glass appearance.   Carotid Doppler  L ICA 80-99%  2D Echo w/ bubble EF 60-65%. No source of embolus   LDL 84  HgbA1c 5.3  VTE prophylaxis - warfarin  warfarin daily prior to admission, now on warfarin daily.   Therapy recommendations:  CIR  Disposition:  pending  Pt will follow up with stroke clinic NP at [facility] in about 4 weeks. Thanks for the consult.  Symptomatic L ICA Stenosis   Carotid Doppler  L ICA 80-99%  VVS consulted Trula Slade)  TCAR 04/27/2020  Post op neuro worsening  CT, CTA, Cerebral angio all neg for occlusion   Per Dr. Trula Slade, ASA, plavix and anticoagulation triple therapy for one month.   Blood Clotting D/O Hx DVT, PE  Home anticoagulation:  warfarin daily, continued in the hospital  Admission INR 2.2, now 2.1  LE doppler partial non-obstructed age indeterminate DVT R common femoral and R popliteal vein, likely chronic   For lovenox bridge if pt decided on TCAR  Hypertension  On Cleviprex gtt  BP goal per surgeon x 24h.  At 24h increase to < 160  Hyperlipidemia  Home meds:  pravachol 20  Now on pravachol 40  LDL 84, goal < 70  Continue statin at discharge  Dysphagia . Secondary to stroke . Cleared for D1 and meds in puree . May need cortrak . Speech on board   Other Stroke Risk Factors  Advanced Age >/= 19   Former Cigarette smoker  ETOH use, advised to drink no more than 2 drink(s) a day  Other Active Problems  Aphasia and right hemiplegia  Hospital day # 7 Continue speech therapy for aphasia as well as swallowing. Start warfarin if patient is able to swallow safely on well as aspirin and Plavix given not to stent. Mobilize out of bed. Physical occupational therapy consults. Wean Cleviprex drip with systolic blood pressure goal below 160. Hopefully transfer to rehab in the next few days. Long discussion with patient, wife and daughter and answered questions. Medical hospitalist team to call service and taper his care  tomorrow This patient is critically ill and at significant risk of neurological worsening, death and care requires constant monitoring of vital signs, hemodynamics,respiratory and cardiac monitoring, extensive review of multiple databases, frequent neurological assessment, discussion with family, other specialists and medical decision making of high complexity.I have made any additions or clarifications directly to the above note.This critical care time does not reflect procedure time, or teaching time or supervisory time of PA/NP/Med Resident etc but could involve care discussion time.  I spent 30 minutes of neurocritical care time  in the care of  this patient.  Antony Contras, MD   To contact Stroke Continuity provider, please refer to [email or web address] After hours, contact General Neurology

## 2020-04-28 NOTE — Progress Notes (Signed)
Pt has been admitted to the unit via hospital bed. All tele and IV are intact. Pt belongings and family is at the bedside. PT denies any pain and has telephone and Call light within reach.  04/28/20 1855  Vitals  Temp 98.9 F (37.2 C)  Temp Source Axillary  BP 126/71  MAP (mmHg) 84  BP Location Right Arm  BP Method Automatic  Patient Position (if appropriate) Sitting  Pulse Rate 83  Pulse Rate Source Dinamap  Resp 20  Level of Consciousness  Level of Consciousness Alert  MEWS COLOR  MEWS Score Color Green  Oxygen Therapy  SpO2 98 %  O2 Device Nasal Cannula  O2 Flow Rate (L/min) 2.5 L/min  Patient Activity (if Appropriate) In bed  Pain Assessment  Pain Scale 0-10  Pain Score 0  MEWS Score  MEWS Temp 0  MEWS Systolic 0  MEWS Pulse 0  MEWS RR 0  MEWS LOC 0  MEWS Score 0

## 2020-04-28 NOTE — Progress Notes (Signed)
Paged Neurology regarding patient staying in [facility]. NP ordered a magnesium now and a BMP for the morning  Will continue to monitor closely. 04/28/2020 5:29 PM Bobby Rumpf, Bell Carbo E, RN

## 2020-04-28 NOTE — Progress Notes (Signed)
I stop by to check on the patient and speak with his family who I missed this morning.  His wife and daughter at the bedside.  He has been transferred out of the ICU to 3 W.  They were pleased to tell me that he did speak a few words today and was able to tolerate some oral intake.  I discussed that his MRI showed new punctate lesions, likely embolic from his surgery.  Again I appreciate the assistance from the stroke team and medical services.  Annamarie Major

## 2020-04-28 NOTE — Progress Notes (Signed)
Initial Nutrition Assessment  DOCUMENTATION CODES:   Not applicable  INTERVENTION:   When Cortrak placed, begin TF: Jevity 1.2 at 25 ml/h, increase by 10 ml every 4 hours to goal rate of 75 ml/h (1800 ml per day) Prosource TF 45 ml once daily  Provides 2200 kcal, 111 gm protein, 1458 ml free water daily.  NUTRITION DIAGNOSIS:   Inadequate oral intake related to dysphagia as evidenced by NPO status.  GOAL:   Patient will meet greater than or equal to 90% of their needs  MONITOR:   TF tolerance,Labs,Diet advancement  REASON FOR ASSESSMENT:   Rounds    ASSESSMENT:   79 yo male admitted with BLE weakness and difficulty walking; left carotid artery stenosis. 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   S/P L TCAR 12/22.  Patient had expressive aphasia, L gaze deviation, and difficulty moving his R side after procedure, so code stroke was called. MRI brain showed new left frontoparietal cortical and subcortical infarcts.   Patient has ongoing aphasia and R arm weakness.  SLP following. S/P bedside evaluation today. Can have critical meds in applesauce, but NPO otherwise. SLP to follow up for ability to complete MBS soon. Cortrak has been ordered for 12/24.  Labs reviewed.  CBG: 101-88  Medications reviewed and include cleviprex. Cleviprex at 4 ml/h is providing 192 kcal from lipid.   NUTRITION - FOCUSED PHYSICAL EXAM:  unable to complete, patient out of room for procedure  Diet Order:   Diet Order            Diet Heart Room service appropriate? Yes; Fluid consistency: Thin  Diet effective now                 EDUCATION NEEDS:   Not appropriate for education at this time  Skin:  Skin Assessment: Skin Integrity Issues: Skin Integrity Issues:: Incisions Incisions: groin and neck  Last BM:  12/21  Height:   Ht Readings from Last 1 Encounters:  04/20/20  6\' 2"  (1.88 m)    Weight:   Wt Readings from Last 1 Encounters:  04/20/20 79.7 kg    Ideal Body Weight:  86.4 kg  BMI:  Body mass index is 22.56 kg/m.  Estimated Nutritional Needs:   Kcal:  2000-2200  Protein:  105-125 gm  Fluid:  >/= 2 L    Lucas Mallow, RD, LDN, CNSC Please refer to Amion for contact information.

## 2020-04-28 NOTE — Progress Notes (Signed)
PT Cancellation Note  Patient Details Name: John Lambert MRN: [id number] DOB: 10-Feb-1941   Cancelled Treatment:    Reason Eval/Treat Not Completed: Patient at procedure or test/unavailable   [name] 04/28/2020, 11:41 AM  [name], PT Acute Rehabilitation Services Pager: [phone] Office: [phone]

## 2020-04-28 NOTE — ED Notes (Signed)
SBAR report called to [facility]. All concerns and questions addressed. Family aware of pt's transfer.    04/28/20 1730  Vitals  BP (!) 136/100  MAP (mmHg) 109  Pulse Rate 92  ECG Heart Rate 100  Resp 19  Oxygen Therapy  SpO2 (!) 86 %  Art Line  Arterial Line BP 131/93  Arterial Line MAP (mmHg) 167 mmHg  MEWS Score  MEWS Temp 0  MEWS Systolic 0  MEWS Pulse 0  MEWS RR 0  MEWS LOC 1  MEWS Score 1  MEWS Score Color Green

## 2020-04-28 NOTE — Progress Notes (Signed)
Subjective  - POD #1, status post left TCAR for symptomatic stenosis  Procedure was complicated by stroke with right-sided hemiplegia, lateral gaze and expressive aphasia.  He underwent CT angiogram and catheter angiogram that did not show any obvious occlusion, and the stent was widely patent.  Throughout the postoperative period, he regained some motor function of his right leg.   Physical Exam:  Left TCAR incision is healing appropriately. Comprehension appears intact however he continues to have expressive aphasia.  He can lift his right leg off the bed but the right arm remains flaccid.       Assessment/Plan:  POD #1  Status post left TCAR for symptomatic stenosis with intraprocedural stroke.  I appreciate the assistance of the stroke team and neuroradiology yesterday.  No large vessel occlusion was identified and the stent is widely patent.  At this point will focus on supporting efforts in his neurologic status with hopes for him.  I updated his wife and daughter frequently yesterday.  He will need to continue on aspirin and Plavix as well as a statin.  He is on Coumadin for clotting disorder.  This was held prior to his operation with Lovenox bridging.  His last Lovenox dose was the night before surgery.  He will need to be on triple therapy for a month.  I would like to get him back on anticoagulation when felt to be safe from the stroke team.  Durene Cal 04/28/2020 7:59 AM --  Vitals:   04/28/20 0700 04/28/20 0754  BP: 117/69   Pulse: 85   Resp: 18   Temp:  98.1 F (36.7 C)  SpO2: 99%     Intake/Output Summary (Last 24 hours) at 04/28/2020 0759 Last data filed at 04/28/2020 0600 Gross per 24 hour  Intake 2575.84 ml  Output 880 ml  Net 1695.84 ml     Laboratory CBC    Component Value Date/Time   WBC 6.8 04/27/2020 0253   HGB 15.9 04/27/2020 0253   HCT 46.9 04/27/2020 0253   PLT 226 04/27/2020 0253    BMET    Component Value Date/Time   NA  141 04/27/2020 0253   K 4.0 04/27/2020 0253   CL 105 04/27/2020 0253   CO2 26 04/27/2020 0253   GLUCOSE 98 04/27/2020 0253   BUN 13 04/27/2020 0253   CREATININE 0.91 04/27/2020 0253   CALCIUM 8.7 (L) 04/27/2020 0253   GFRNONAA >60 04/27/2020 0253    COAG Lab Results  Component Value Date   INR 1.2 04/28/2020   INR 1.3 (H) 04/27/2020   INR 1.3 (H) 04/26/2020   PROTIME 39.6 (H) 03/19/2007   No results found for: PTT  Antibiotics Anti-infectives (From admission, onward)   Start     Dose/Rate Route Frequency Ordered Stop   04/27/20 0600  ceFAZolin (ANCEF) IVPB 2g/100 mL premix  Status:  Discontinued       Note to Pharmacy: Send with pt to OR   2 g 200 mL/hr over 30 Minutes Intravenous On call 04/26/20 0805 04/26/20 2018   04/27/20 0600  ceFAZolin (ANCEF) IVPB 2g/100 mL premix        2 g 200 mL/hr over 30 Minutes Intravenous 30 min pre-op 04/26/20 2013 04/27/20 0804   04/27/20 0000  ceFAZolin (ANCEF) IVPB 1 g/50 mL premix  Status:  Discontinued       Note to Pharmacy: Send with pt to OR   1 g 100 mL/hr over 30 Minutes Intravenous On call 04/26/20 0800  04/26/20 0805       V. Leia Alf, M.D., [facility] [place] Office: [phone] Pager:  [phone]

## 2020-04-28 NOTE — Progress Notes (Signed)
  Speech Language Pathology Treatment: Cognitive-Linquistic  Patient Details Name: John Lambert MRN: [id number] DOB: 18-Jul-1940 Today's Date: 04/28/2020 Time: 5176-1607 SLP Time Calculation (min) (ACUTE ONLY): 14 min  Assessment / Plan / Recommendation Clinical Impression  Pt was seen for treatment with his wife and daughter present. Pt and his family were educated regarding the results of the assessment, the nature of aphasia, and recommendations. Aphasia handout was provided. Family was also educated regarding strategies to improve auditory comprehension, facilitate communication with the pt, and to reduce the pt's frustration. Both parties verbalized understanding as well as agreement and pt's wife expressed gratitude stating that he looked "a hundred times better" than at the beginning of the day. Pt did not complete automatic sequences despite verbal/tactile cues. He demonstrated 0% accuracy with imitation of phonemes despite tactile cues for articulatory placement, but exhibited some vocalization when attempting to imitate /h/. He was able to inconsistently imitate throat clearing and coughing, but not humming. Spontaneous vocalization was intermittently noted at the end of the session in response to family. SLP will continue to follow pt.    HPI HPI: Pt is an 79 y.o. 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 He also has pain in left upper leg/hip region, and R foot drag; Imaging notable for Numerous scattered punctate acute infarctions in the left  posterior frontal region consistent with micro embolic infarctions  which could either be in the anterior or middle cerebral artery  territory. Punctate acute infarction also affecting the splenium of  the corpus callosum. Pt was seen by SLP on 12/17 for a BSE and SLE with no  subsequent need for SLP services. Left carotid TCAR procedure with stent placement conducted on 12/22. Following the procedure he was found to be aphasic unable to speak with left gaze deviation and right hemiparesis. Emergent CTA showed patent left carotid stent with good flow in the terminal left ICA as well as middle cerebral artery in the M1 segment. There appeared to be decreased flow in one of the M2 branches by neurology. SLP was consulted due to pt failing the [place], but a failed swallow screen has not been documented in EMR. EEG 12/22: No seizures      SLP Plan  Continue with current plan of care       Recommendations  Diet recommendations: NPO Medication Administration: Crushed with puree                Oral Care Recommendations: Oral care QID Follow up Recommendations: Inpatient Rehab SLP Visit Diagnosis: Aphasia (R47.01) Plan: Continue with current plan of care       Taysia Rivere I. Hardin Negus, [place], [facility] Office number [phone] Pager [place] 04/28/2020, 1:30 PM

## 2020-04-29 ENCOUNTER — Inpatient Hospital Stay (HOSPITAL_COMMUNITY): Payer: Medicare Other

## 2020-04-29 LAB — BASIC METABOLIC PANEL
Anion gap: 9 (ref 5–15)
BUN: 17 mg/dL (ref 8–23)
CO2: 22 mmol/L (ref 22–32)
Calcium: 8.3 mg/dL — ABNORMAL LOW (ref 8.9–10.3)
Chloride: 106 mmol/L (ref 98–111)
Creatinine, Ser: 0.77 mg/dL (ref 0.61–1.24)
GFR, Estimated: >60 mL/min (ref >60)
Glucose, Bld: 121 mg/dL — ABNORMAL HIGH (ref 70–99)
Potassium: 4 mmol/L (ref 3.5–5.1)
Sodium: 137 mmol/L (ref 135–145)

## 2020-04-29 LAB — GLUCOSE, CAPILLARY
Glucose-Capillary: 130 mg/dL — ABNORMAL HIGH (ref 70–99)
Glucose-Capillary: 141 mg/dL — ABNORMAL HIGH (ref 70–99)
Glucose-Capillary: 152 mg/dL — ABNORMAL HIGH (ref 70–99)

## 2020-04-29 LAB — PROTIME-INR
INR: 1.3 — ABNORMAL HIGH (ref 0.8–1.2)
Prothrombin Time: 15.5 seconds — ABNORMAL HIGH (ref 11.4–15.2)

## 2020-04-29 LAB — CBC
HCT: 40.9 % (ref 39.0–52.0)
Hemoglobin: 13.8 g/dL (ref 13.0–17.0)
MCH: 30.5 pg (ref 26.0–34.0)
MCHC: 33.7 g/dL (ref 30.0–36.0)
MCV: 90.5 fL (ref 80.0–100.0)
Platelets: 192 10*3/uL (ref 150–400)
RBC: 4.52 MIL/uL (ref 4.22–5.81)
RDW: 13.3 % (ref 11.5–15.5)
WBC: 11 10*3/uL — ABNORMAL HIGH (ref 4.0–10.5)
nRBC: 0 % (ref 0.0–0.2)

## 2020-04-29 LAB — HEPARIN LEVEL (UNFRACTIONATED): Heparin Unfractionated: 0.25 IU/mL — ABNORMAL LOW (ref 0.30–0.70)

## 2020-04-29 MED ORDER — WARFARIN SODIUM 2.5 MG PO TABS
2.5000 mg | ORAL_TABLET | Freq: Once | ORAL | Status: AC
  Filled 2020-04-29 (×2): qty 1

## 2020-04-29 MED ORDER — JEVITY 1.2 CAL PO LIQD
1000.0000 mL | ORAL | Status: DC
  Administered 2020-04-29 – 2020-05-02 (×2): 1000 mL
  Filled 2020-04-29 (×2): qty 1000

## 2020-04-29 MED ORDER — HEPARIN (PORCINE) 25000 UT/250ML-% IV SOLN
1250.0000 [IU]/h | INTRAVENOUS | Status: DC
  Administered 2020-04-29: 13:00:00 950 [IU]/h via INTRAVENOUS
  Administered 2020-05-01 – 2020-05-02 (×3): 1250 [IU]/h via INTRAVENOUS
  Filled 2020-04-29 (×7): qty 250

## 2020-04-29 MED ORDER — OSMOLITE 1.2 CAL PO LIQD: 1000.0000 mL | ORAL | Status: DC

## 2020-04-29 MED ORDER — PROSOURCE TF PO LIQD
45.0000 mL | Freq: Every day | ORAL | Status: DC
  Administered 2020-04-29 – 2020-05-05 (×5): 45 mL
  Filled 2020-04-29 (×5): qty 45

## 2020-04-29 NOTE — Progress Notes (Signed)
°  Progress Note    04/29/2020 10:10 AM 2 Days Post-Op  Subjective: Family at bedside, patient nonverbal this morning  Vitals:   04/29/20 0315 04/29/20 0847  BP: 108/72 102/66  Pulse: 93 78  Resp: 17 16  Temp: 99.3 F (37.4 C) 98.9 F (37.2 C)  SpO2: 98% 97%    Physical Exam: Patient is awake and alert however nonverbal Left neck incision healing well Moving left arm but not right arm He can lift right foot off of the bed  CBC    Component Value Date/Time   WBC 11.0 (H) 04/29/2020 0301   RBC 4.52 04/29/2020 0301   HGB 13.8 04/29/2020 0301   HCT 40.9 04/29/2020 0301   PLT 192 04/29/2020 0301   MCV 90.5 04/29/2020 0301   MCH 30.5 04/29/2020 0301   MCHC 33.7 04/29/2020 0301   RDW 13.3 04/29/2020 0301   LYMPHSABS 1.3 04/21/2020 0423   MONOABS 0.8 04/21/2020 0423   EOSABS 0.2 04/21/2020 0423   BASOSABS 0.0 04/21/2020 0423    BMET    Component Value Date/Time   NA 137 04/29/2020 0301   K 4.0 04/29/2020 0301   CL 106 04/29/2020 0301   CO2 22 04/29/2020 0301   GLUCOSE 121 (H) 04/29/2020 0301   BUN 17 04/29/2020 0301   CREATININE 0.77 04/29/2020 0301   CALCIUM 8.3 (L) 04/29/2020 0301   GFRNONAA >60 04/29/2020 0301    INR    Component Value Date/Time   INR 1.3 (H) 04/29/2020 0301   INR 3.30 03/19/2007 0806     Intake/Output Summary (Last 24 hours) at 04/29/2020 1010 Last data filed at 04/29/2020 0554 Gross per 24 hour  Intake 1299.51 ml  Output 335 ml  Net 964.51 ml     Assessment/plan:  79 y.o. male is status post left transcarotid artery stenting for symptomatic disease complicated by stroke.  Appreciate stroke team treatment of this patient.  Vascular surgery following.  Korbin Notaro C. Donzetta Matters, MD [facility] [place] Office: [phone] Pager: [phone]  04/29/2020 10:10 AM

## 2020-04-29 NOTE — Progress Notes (Signed)
Patient pulled out his FT cortrack at this time, oncall provider is notified AND waiting for reply. Patient is stable and will monitor continuously.

## 2020-04-29 NOTE — Progress Notes (Signed)
ANTICOAGULATION CONSULT NOTE - Initial Consult  Pharmacy Consult for enoxaparin>> heparin gtt, warfarin Indication: on PTA warfarin for hx of DVT/PE, new acute stroke, new Afib  Allergies  Allergen Reactions   Hctz [Hydrochlorothiazide] Other (See Comments)    dizzy   Hydrocodone Itching   Vicodin [Hydrocodone-Acetaminophen] Itching    Patient Measurements: Height: 6\' 2"  (188 cm) Weight: 79.7 kg (175 lb 11.3 oz) IBW/kg (Calculated) : 82.2  Heparin weight: 75.8 kg  Vital Signs: Temp: 98.9 F (37.2 C) (12/24 0847) Temp Source: Oral (12/24 0847) BP: 102/66 (12/24 0847) Pulse Rate: 78 (12/24 0847)  Labs: Recent Labs    04/26/20 2108 04/27/20 0253 04/28/20 0500 04/28/20 1600 04/29/20 0301  HGB 16.2 15.9  --   --  13.8  HCT 49.2 46.9  --   --  40.9  PLT 224 226  --   --  192  APTT 39*  --   --   --   --   LABPROT 15.9* 15.4* 14.5  --  15.5*  INR 1.3* 1.3* 1.2  --  1.3*  CREATININE 0.87 0.91  --  0.77 0.77    Estimated Creatinine Clearance: 81.6 mL/min (by C-G formula based on SCr of 0.77 mg/dL).  Assessment: 79 yo male with L symptomatic carotid stenosis.  S/p L carotid TCAR procedure w/ stenting that was complicated by postprocedure stroke.  MRI brain shows patchy L basal ganglia and L frontoparietal cortical and subcortical infarcts (new).  No hemorrhagic transformation.  Pharmacy consulted to switch treatment dose Lovenox to heparin drip given new stroke and new onset atrial fibrillation.   PTA warfarin dose: 2.5mg  daily except 1.25mg  on Mondays INR today is 1.3.  Will give warfarin 2.5mg  today.  INR goal 2-3  Goal of Therapy:  Heparin level 0.3-0.5 units/ml (more narrow goal due to recent stroke) Monitor platelets by anticoagulation protocol: Yes  INR goal 2-3   Plan:  Discontinue Lovenox No heparin bolus d/t receiving treatment dose Lovenox and recent stroke Initiate heparin drip at 950 units/hour Follow-up 8 hour HL Warfarin 2.5 mg x 1 Monitor daily  CBC and INR Triple therapy x 1 month  Dimple Nanas, PharmD PGY-1 Acute Care Pharmacy Resident Office: [phone] 04/29/2020 9:43 AM  Please check AMION for all [place] numbers

## 2020-04-29 NOTE — Progress Notes (Signed)
Modified Barium Swallow Progress Note  Patient Details  Name: John Lambert MRN: [id number] Date of Birth: [birth year]  Today's Date: 04/29/2020  Modified Barium Swallow completed.  Full report located under Chart Review in the Imaging Section.  Brief recommendations include the following:  Clinical Impression  Pt has slow lingual propulsion and lingual residue for which he performs a slow but spontaneous second swallow. He has reduced base of tongue retraction, epiglottic inversion, and laryngeal vestibule closure that results in aspiration with all liquids as well as vallecular residue that increases with thicker consistencies. Aspiration sometimes occurred on the second swallow when he was clearing oral residue. Aspiration throughout the study was almost entirely silent, with one cough on a larger volume of thin liquids, but otherwise he does not cough to command. Pt initially protected his airway better when alternating between puree and spoonfuls of honey thick liquids, but ultimately he did aspirate after the swallow on the residue. No aspiration was observed when alternating purees with spoonfuls of nectar thick liquids as there also appeared to be mildly improved clearance of residuals. Discussed current presentation with his daughter, who was present for the study. My concern with a full PO diet at this point would be getting adequate nutrition as well as how strictly precautions would need to be followed to reduce aspiration risk. She is in agreement with proceeding with a Cortrak but allowing small snacks from the floor stock of purees alternated with spoonfuls of only pre-thickened nectar thick liquids to ensure adequate consistency. Will f/u to see if a diet of these consistencies can be started and will also try to initiate swallowing exercises with repeat MBS likely needed prior to advancing to any other consistencies.   Swallow Evaluation Recommendations       SLP Diet  Recommendations: Alternative means - temporary;Other (Comment) (small snacks of purees and pre-thickened nectar thick liquids from the floor stock)   Liquid Administration via: Spoon   Medication Administration: Via alternative means (could give crushed in puree if alternative access not available)   Supervision: Staff to assist with self feeding;Full supervision/cueing for compensatory strategies   Compensations: Slow rate;Small sips/bites;Follow solids with liquid   Postural Changes: Seated upright at 90 degrees;Remain semi-upright after after feeds/meals (Comment)   Oral Care Recommendations: Oral care QID        Osie Bond., M.A. [place] Pager [phone] Office [phone]  04/29/2020,12:49 PM

## 2020-04-29 NOTE — Progress Notes (Signed)
ANTICOAGULATION CONSULT NOTE - Initial Consult  Pharmacy Consult for enoxaparin>> heparin gtt, warfarin Indication: on PTA warfarin for hx of DVT/PE, new acute stroke, new Afib  Allergies  Allergen Reactions   Hctz [Hydrochlorothiazide] Other (See Comments)    dizzy   Hydrocodone Itching   Vicodin [Hydrocodone-Acetaminophen] Itching    Patient Measurements: Height: 6\' 2"  (188 cm) Weight: 79.7 kg (175 lb 11.3 oz) IBW/kg (Calculated) : 82.2  Heparin weight: 75.8 kg  Vital Signs: Temp: 98.7 F (37.1 C) (12/24 1520) Temp Source: Axillary (12/24 1520) BP: 101/64 (12/24 1651) Pulse Rate: 88 (12/24 1651)  Labs: Recent Labs    04/26/20 2108 04/27/20 0253 04/28/20 0500 04/28/20 1600 04/29/20 0301 04/29/20 1852  HGB 16.2 15.9  --   --  13.8  --   HCT 49.2 46.9  --   --  40.9  --   PLT 224 226  --   --  192  --   APTT 39*  --   --   --   --   --   LABPROT 15.9* 15.4* 14.5  --  15.5*  --   INR 1.3* 1.3* 1.2  --  1.3*  --   HEPARINUNFRC  --   --   --   --   --  0.25*  CREATININE 0.87 0.91  --  0.77 0.77  --     Estimated Creatinine Clearance: 81.6 mL/min (by C-G formula based on SCr of 0.77 mg/dL).  Assessment: 79 yo male with L symptomatic carotid stenosis.  S/p L carotid TCAR procedure w/ stenting that was complicated by postprocedure stroke.  MRI brain shows patchy L basal ganglia and L frontoparietal cortical and subcortical infarcts (new).  No hemorrhagic transformation.  Pharmacy consulted to switch treatment dose Lovenox to heparin drip given new stroke and new onset atrial fibrillation.   PTA warfarin dose: 2.5mg  daily except 1.25mg  on Mondays  12/24 PM heparin level: 0.25 (subtherapeutic)   Goal of Therapy:  Heparin level 0.3-0.5 units/ml (more narrow goal due to recent stroke) Monitor platelets by anticoagulation protocol: Yes  INR goal 2-3   Plan: Increase heparin rate to 1050 units/hr  Follow up 8hr HL at 0500 Monitor daily CBC, INR, heparin levels,  s/sx bleeding  Wilson Singer, PharmD PGY1 Pharmacy Resident 04/29/2020 7:43 PM

## 2020-04-29 NOTE — Progress Notes (Addendum)
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 Punctate acute infarction also affecting the splenium ofthe corpus callosum. Extensive chronic small-vessel ischemic changes elsewherethroughout the brain. Small old left parietal vertex cortical and subcorticalInfarctions.  MRI lumbar spine showed chronic arachnoiditis with adhesions in the thecal sac and distorted cauda equina nerve roots from L2-L3 to the sacrum, but no evidence of recurrent spinal tumor.  Possible right L5/S1 radiculitis after noting L4/5 multifactorial mild to moderate spinal stenosis.  Vascular surgery was consulted and took patient on 12/22 for a left transcarotid artery stenting.  Patient found to have 80% carotid stenosis and then following procedure down to less than 10% with stent.  However, following surgery patient noted to have difficulty moving right side expressive aphasia and left gaze deviation and code stroke immediately called.  Patient taken straight to CT scan.  No new acute infarction noted.  Patient underwent emergent left carotid diagnostic cerebral catheter angiogram with left MCA embolism noted from previous, but no acute findings.  No intervention planned.  Patient sent to medical ICU in the absence of  beds in the neuro ICU.  EEG noted cortical dysfunction left hemisphere, but no seizures or epileptiform discharges seen throughout recording.  Patient transferred to stroke service on 12/22 Transferred out to [facility] service 04/29/2020    Assessment & Plan:   Principal Problem:   Weakness Active Problems:   Anticoagulated on warfarin   Impaired ambulation   Fall at home, initial encounter   Essential hypertension   Blood clotting disorder ([place])   Cerebral embolism with cerebral infarction   Stroke [facility])   Middle cerebral artery embolism, left   #1  Acute CVA on presentation from left carotid artery stenosis status post left carotid stenting with complications with possible new stroke.  He underwent elective left carotid transcranial procedure with stenting complicated by postop stroke. He had an emergent CT angiogram and cerebral catheter angiogram which did not reveal large vessel occlusion. EEG did not show any epileptiform activities.  MRI of the brain showed patchy left basal ganglia as well as left frontoparietal cortical and subcortical infarcts which are new in addition to the previous recent left MCA frontal infarct which was subacute. Seen by speech therapy recommending modified barium swallow. PT OT recommends CIR. Hemoglobin A1c is 5.3, LDL is 84, echo shows normal ejection fraction with no source of emboli. Carotid Doppler with left ICA 80 to 99%. He is currently on heparin and Coumadin till INR is therapeutic. Patient is on aspirin Plavix heparin and Coumadin.  Aspirin and Plavix to be continued for 30 days together with Coumadin And then to continue aspirin and Coumadin. Patient with ongoing dysphagia dysarthria/aphasia and dense right hemiplegia.  [place] TUBE PLACED 12/24  #2 left carotid artery stenosis vascular surgery was consulted he is status post carotid artery stenting.  #3  History  of clotting disorder including PE DVT was on Coumadin prior to admission.   Doppler of the lower extremity shows chronic DVT.  Continue heparin and Coumadin.  #4 hyperlipidemia LDL 84 continue Pravachol 40 mg daily.  Continue the same dose upon discharge.  #5 paroxysmal A. fib CHA2DS2-VASc score above 5 continue Coumadin on discharge  #6 history of essential hypertension on Coreg twice a day.  Off of Cleviprex drip.    Nutrition Problem: Inadequate oral intake Etiology: dysphagia     Signs/Symptoms: NPO status    Interventions: Tube feeding  Estimated body mass index is 22.56 kg/m as calculated from the following:   Height as of this encounter: 6\' 2"  (1.88 m).   Weight as of this encounter: 79.7 kg.  DVT prophylaxis: Heparin and Coumadin  code Status: Full code Family Communication: None at bedside  disposition Plan:  Status is: Inpatient   Dispo: The patient is from: Home              Anticipated d/c is to: Home              Anticipated d/c date is: > 3 days              Patient currently is not medically stable to d/c.   Consultants: Vascular surgery, neurology    Procedures: None Antimicrobials: None  Subjective:  Patient resting in bed eyes open communicates by nodding his head yes or no and up and down Objective: Vitals:   04/28/20 2042 04/29/20 0046 04/29/20 0315 04/29/20 0847  BP: 120/77 102/60 108/72 102/66  Pulse: 96 98 93 78  Resp: 18 18 17 16   Temp: 97.8 F (36.6 C) 99 F (37.2 C) 99.3 F (37.4 C) 98.9 F (37.2 C)  TempSrc: Oral Oral Oral Oral  SpO2: 97% 96% 98% 97%  Weight:      Height:        Intake/Output Summary (Last 24 hours) at 04/29/2020 1142 Last data filed at 04/29/2020 0900 Gross per 24 hour  Intake 1399.94 ml  Output 280 ml  Net 1119.94 ml   Filed Weights   04/19/20 0043 04/20/20 0037 04/20/20 2034  Weight: 77.1 kg 75.8 kg 79.7 kg    Examination:  General exam: Appears calm and comfortable  Respiratory system: Clear to auscultation. Respiratory effort normal. Cardiovascular system: S1 &  S2 heard, RRR. No JVD, murmurs, rubs, gallops or clicks. No pedal edema. Gastrointestinal system: Abdomen is nondistended, soft and nontender. No organomegaly or masses felt. Normal bowel sounds heard. Central nervous system: Awake aphasic right upper and lower extremity hemiplegia noted  extremities: Symmetric 5 x 5 power. Skin: No rashes, lesions or ulcers Psychiatry: Unable to assess   Data Reviewed: I have personally reviewed following labs and imaging studies  CBC: Recent Labs  Lab 04/25/20 0237 04/26/20 0210 04/26/20 2108 04/27/20 0253 04/29/20 0301  WBC 6.5 7.2 6.9 6.8 11.0*  HGB 15.2 15.3 16.2 15.9 13.8  HCT 46.0 45.0 49.2 46.9 40.9  MCV 90.6 89.5 89.6 91.1 90.5  PLT 211 216 224 226 322   Basic Metabolic Panel: Recent Labs  Lab 04/26/20 0210 04/26/20 2108 04/27/20 0253 04/28/20 1600 04/29/20 0301  NA 138 137 141 139 137  K 3.8 4.1 4.0 3.9 4.0  CL 105 104 105 107 106  CO2 22 24 26 22 22   GLUCOSE 96 113* 98 100* 121*  BUN 16 15 13 16 17   CREATININE 0.77 0.87 0.91 0.77 0.77  CALCIUM 8.3* 8.6* 8.7* 8.2*  8.3*  MG 1.9  --  2.1 1.9  --   PHOS 3.6  --  3.1  --   --    GFR: Estimated Creatinine Clearance: 81.6 mL/min (by C-G formula based on SCr of 0.77 mg/dL). Liver Function Tests: Recent Labs  Lab 04/26/20 2108  AST 30  ALT 28  ALKPHOS 52  BILITOT 0.8  PROT 6.3*  ALBUMIN 3.5   No results for input(s): LIPASE, AMYLASE in the last 168 hours. No results for input(s): AMMONIA in the last 168 hours. Coagulation Profile: Recent Labs  Lab 04/26/20 0210 04/26/20 2108 04/27/20 0253 04/28/20 0500 04/29/20 0301  INR 1.6* 1.3* 1.3* 1.2 1.3*   Cardiac Enzymes: No results for input(s): CKTOTAL, CKMB, CKMBINDEX, TROPONINI in the last 168 hours. BNP (last 3 results) No results for input(s): PROBNP in the last 8760 hours. HbA1C: Recent Labs    04/28/20 0135  HGBA1C 5.1   CBG: Recent Labs  Lab 04/27/20 1933 04/27/20 2319 04/28/20 0326 04/28/20 0752  04/28/20 1553  GLUCAP 106* 97 101* 88 88   Lipid Profile: Recent Labs    04/28/20 0135  CHOL 104  HDL 30*  LDLCALC 61  TRIG 66  CHOLHDL 3.5   Thyroid Function Tests: No results for input(s): TSH, T4TOTAL, FREET4, T3FREE, THYROIDAB in the last 72 hours. Anemia Panel: No results for input(s): VITAMINB12, FOLATE, FERRITIN, TIBC, IRON, RETICCTPCT in the last 72 hours. Sepsis Labs: No results for input(s): PROCALCITON, LATICACIDVEN in the last 168 hours.  Recent Results (from the past 240 hour(s))  MRSA PCR Screening     Status: None   Collection Time: 04/27/20  3:39 PM   Specimen: Nasopharyngeal  Result Value Ref Range Status   MRSA by PCR NEGATIVE NEGATIVE Final    Comment:        The GeneXpert MRSA Assay (FDA approved for NASAL specimens only), is one component of a comprehensive MRSA colonization surveillance program. It is not intended to diagnose MRSA infection nor to guide or monitor treatment for MRSA infections. Performed at [facility], [place] [address] [place], [place] [address]          Radiology Studies: MR BRAIN WO CONTRAST  Result Date: 04/28/2020 CLINICAL DATA:  Left carotid stent placed for stenosis. EXAM: MRI HEAD WITHOUT CONTRAST TECHNIQUE: Multiplanar, multiecho pulse sequences of the brain and surrounding structures were obtained without intravenous contrast. COMPARISON:  04/27/2020.  MRI 04/20/2020. FINDINGS: Brain: No acute finding affects the brainstem or cerebellum. Few old small vessel cerebellar infarctions on the left. Old small vessel infarctions of the pons. Right cerebral hemisphere shows atrophy with chronic small-vessel ischemic changes of the white matter. Old small right frontal vertex cortical and subcortical infarction. Left hemisphere shows areas of subacute infarction at the medial left frontoparietal vertex. New areas of acute infarction are present scattered within the basal ganglia and radiating white matter tracts and within  the left frontoparietal cortical and subcortical brain. No large confluent infarction. No swelling or hemorrhage. Chronic small-vessel ischemic changes seen elsewhere. No hydrocephalus or extra-axial collection. Vascular: Major vessels at the base of the brain show flow. Skull and upper cervical spine: Negative Sinuses/Orbits: Clear/normal Other: None IMPRESSION: 1. New areas of acute infarction are present scattered within the left basal ganglia and radiating white matter tracts and within the left frontoparietal cortical and subcortical brain. No large confluent infarction. No swelling or hemorrhage. 2. Subacute infarction in the left medial frontoparietal vertex undergoing expected evolutionary changes. 3. Chronic small-vessel ischemic  changes elsewhere throughout the brain as outlined above. Electronically Signed   By: Nelson Chimes M.D.   On: 04/28/2020 12:24   EEG adult  Result Date: 04/27/2020 Lora Havens, MD     04/27/2020  5:46 PM Patient Name: John Lambert MRN: [id number] Epilepsy Attending: Lora Havens Referring Physician/Provider: Dr Antony Contras Date: 04/27/2020 Duration: 25.57 mins Patient history: 79 year old Caucasian male with sudden onset of expressive aphasia left gaze deviation and right hemiplegia. EEG to evaluate for seizure Level of alertness: Awake, drowsy, sleep, comatose, lethargic AEDs during EEG study: None Technical aspects: This EEG study was done with scalp electrodes positioned according to the 10-20 International system of electrode placement. Electrical activity was acquired at a sampling rate of 500Hz  and reviewed with a high frequency filter of 70Hz  and a low frequency filter of 1Hz . EEG data were recorded continuously and digitally stored. Description: The posterior dominant rhythm consists of 8 Hz activity of moderate voltage (25-35 uV) seen predominantly in posterior head regions, symmetric and reactive to eye opening and eye closing. EEG showed continuous  generalized 3 to 6 Hz theta-delta slowing in left hemisphere, maximal left frontotemporal region, which at times appears sharply contoured. Hyperventilation and photic stimulation were not performed. ABNORMALITY -Continuous slow, left hemisphere, maximal left frontotemporal region IMPRESSION: This study is suggestive of cortical dysfunction in left hemisphere, maximal left fronto-temporal region likely secondary to underlying stroke/structural abnormality. No seizures or epileptiform discharges were seen throughout the recording. Priyanka O Yadav   IR ANGIO INTRA EXTRACRAN SEL COM CAROTID INNOMINATE UNI L MOD SED  Result Date: 04/28/2020 CLINICAL DATA:  Onset of left-sided gaze deviation and right-sided weakness arm greater than leg with speech difficulties post surgery. EXAM: IR ANGIO INTRA EXTRACRAN SEL COM CAROTID INNOMINATE UNI LEFT MOD SED COMPARISON:  CT angiogram of the head and neck of April 27, 2020, and April 20, 2020. MEDICATIONS: Heparin 1000 units IV. No antibiotic was administered within 1 hour of the procedure. ANESTHESIA/SEDATION: General anesthesia. CONTRAST:  Isovue 300 approximately 45 mL FLUOROSCOPY TIME:  Fluoroscopy Time: 4 minutes 18 seconds (427.1 mGy). COMPLICATIONS: None immediate. TECHNIQUE: Informed written consent was obtained from the patient after a thorough discussion of the procedural risks, benefits and alternatives. All questions were addressed. Maximal Sterile Barrier Technique was utilized including caps, mask, sterile gowns, sterile gloves, sterile drape, hand hygiene and skin antiseptic. A timeout was performed prior to the initiation of the procedure. The right groin was prepped and draped in the usual sterile fashion. Thereafter using modified Seldinger technique, transfemoral access into the right common femoral artery was obtained without difficulty. Over a 0.035 inch guidewire, a 5 French Pinnacle sheath was inserted. Through this, and also over 0.035 inch  guidewire, a 5 [place] JB 1 catheter was advanced to the aortic arch region and selectively positioned in left common carotid artery. FINDINGS:.: FINDINGS:. The left common carotid arteriogram demonstrates the left external carotid artery and its major branches to be widely patent. The stent is seen positioned in the proximal left internal carotid artery extending into the distal left common carotid artery. The proximal portion of the stent at the level of the carotid bifurcation demonstrates step-off anteriorly and posteriorly suggestive of partial fracture. There is approximately 30% to 40% narrowing in the proximal portion of the stented segment of the ICA by the NASCET criteria, on the AP projection. More distally the vessel is seen to opacify to the cranial skull base. The petrous, the cavernous and the supraclinoid segments  are widely patent. The left middle cerebral artery and the left anterior cerebral artery opacify into the capillary and venous phases. No angiographic evidence of intraluminal filling defects or of occlusions are seen into the more tertiary branches of the left MCA distribution. Also seen is prompt opacification via the anterior communicating artery of the right anterior cerebral A2 segment and distally. IMPRESSION: Approximately 30-40% stenosis within the stented segment of the left ICA proximally on the AP projection. Intracranially no evidence of intraluminal filling defects, occlusions, stenosis or of dissections. PLAN: Patient was extubated successfully and returned to the PACU. Electronically Signed   By: Luanne Bras M.D.   On: 04/27/2020 14:15        Scheduled Meds: . aspirin  81 mg Oral Daily  . carvedilol  25 mg Oral BID WC  . Chlorhexidine Gluconate Cloth  6 each Topical Daily  . clopidogrel  75 mg Oral Daily  . pravastatin  40 mg Oral Daily  . warfarin  2.5 mg Oral ONCE-1600  . Warfarin - Pharmacist Dosing Inpatient   Does not apply q1600   Continuous  Infusions: . dextrose 5 % and 0.9% NaCl 70 mL/hr at 04/29/20 0554  . heparin       LOS: 8 days     Georgette Shell, MD 04/29/2020, 11:42 AM

## 2020-04-29 NOTE — Progress Notes (Signed)
  Speech Language Pathology Treatment: Dysphagia  Patient Details Name: John Lambert MRN: [id number] DOB: 1940/08/15 Today's Date: 04/29/2020 Time: 9675-9163 SLP Time Calculation (min) (ACUTE ONLY): 14 min  Assessment / Plan / Recommendation Clinical Impression  Pt was seen with additional PO trials this morning to assess readiness and need for MBS. Pt's family reports no overt difficulties with PO meds crushed in applesauce, and they said that he was even given a few sips of water without overt coughing. This morning his presentation is more consistent with swallowing evaluation on previous date, with coughing noted consistently with thin liquids (although sometimes delayed). Anterior loss is also observed with thin liquids and he also has additional subswallows with purees - although today it is only about 2 swallows per bite. Pt/family are agreeable to proceed with MBS, scheduled for this morning with radiology.   HPI HPI: Pt is an 79 y.o. 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 He also has pain in left upper leg/hip region, and R foot drag; Imaging notable for Numerous scattered punctate acute infarctions in the left  posterior frontal region consistent with micro embolic infarctions  which could either be in the anterior or middle cerebral artery  territory. Punctate acute infarction also affecting the splenium of  the corpus callosum. Pt was seen by SLP on 12/17 for a BSE and SLE with no subsequent need for SLP services. Left carotid TCAR procedure with stent placement conducted on 12/22. Following the procedure he was found to be aphasic unable to speak with left gaze deviation and right hemiparesis. Emergent CTA showed patent left carotid stent with good flow in the terminal left ICA as well as middle cerebral artery in the M1  segment. There appeared to be decreased flow in one of the M2 branches by neurology. SLP was consulted due to pt failing the [place], but a failed swallow screen has not been documented in EMR. EEG 12/22: No seizures      SLP Plan  MBS       Recommendations  Diet recommendations: NPO Medication Administration: Crushed with puree                Oral Care Recommendations: Oral care QID Follow up Recommendations: Inpatient Rehab SLP Visit Diagnosis: Dysphagia, unspecified (R13.10) Plan: MBS       GO                Osie Bond., M.A. [facility] Pager [phone] Office [phone]  04/29/2020, 9:16 AM

## 2020-04-29 NOTE — Procedures (Signed)
Cortrak  Person Inserting Tube:  Maylon Peppers C, RD Tube Type:  Cortrak - 43 inches Tube Location:  Right nare Initial Placement:  Stomach Secured by: Bridle Technique Used to Measure Tube Placement:  Documented cm marking at nare/ corner of mouth Cortrak Secured At:  70 cm    Cortrak Tube Team Note:  Consult received to place a Cortrak feeding tube.   No x-ray is required. RN may begin using tube.   If the tube becomes dislodged please keep the tube and contact the Cortrak team at [email or web address] ([email or web address]) for replacement.  If after hours and replacement cannot be delayed, place a NG tube and confirm placement with an abdominal x-ray.    Lockie Pares., RD, LDN, CNSC See AMiON for contact information

## 2020-04-29 NOTE — Progress Notes (Signed)
STROKE TEAM PROGRESS NOTE   INTERVAL HISTORY Wife and daughter at the bedside.  Patient continues to have severe expressive aphasia and is unable to speak but has improvement in gaze palsy and is now able to look to the right past midline.  Remains with dense right hemiplegia.  He had been started on Lovenox for bridging warfarin.  His wife and daughter at the bedside  Vitals:   04/28/20 2042 04/29/20 0046 04/29/20 0315 04/29/20 0847  BP: 120/77 102/60 108/72 102/66  Pulse: 96 98 93 78  Resp: 18 18 17 16   Temp: 97.8 F (36.6 C) 99 F (37.2 C) 99.3 F (37.4 C) 98.9 F (37.2 C)  TempSrc: Oral Oral Oral Oral  SpO2: 97% 96% 98% 97%  Weight:      Height:       CBC:  Recent Labs  Lab 04/27/20 0253 04/29/20 0301  WBC 6.8 11.0*  HGB 15.9 13.8  HCT 46.9 40.9  MCV 91.1 90.5  PLT 226 [id number]   Basic Metabolic Panel:  Recent Labs  Lab 04/26/20 0210 04/26/20 2108 04/27/20 0253 04/28/20 1600 04/29/20 0301  NA 138   < > 141 139 137  K 3.8   < > 4.0 3.9 4.0  CL 105   < > 105 107 106  CO2 22   < > 26 22 22   GLUCOSE 96   < > 98 100* 121*  BUN 16   < > 13 16 17   CREATININE 0.77   < > 0.91 0.77 0.77  CALCIUM 8.3*   < > 8.7* 8.2* 8.3*  MG 1.9  --  2.1 1.9  --   PHOS 3.6  --  3.1  --   --    < > = values in this interval not displayed.   Lipid Panel:  Recent Labs  Lab 04/28/20 0135  CHOL 104  TRIG 66  HDL 30*  CHOLHDL 3.5  VLDL 13  LDLCALC 61   HgbA1c:  Recent Labs  Lab 04/28/20 0135  HGBA1C 5.1   Urine Drug Screen: No results for input(s): LABOPIA, COCAINSCRNUR, LABBENZ, AMPHETMU, THCU, LABBARB in the last 168 hours.  Alcohol Level No results for input(s): ETH in the last 168 hours.  IMAGING past 24 hours MR BRAIN WO CONTRAST  Result Date: 04/28/2020 CLINICAL DATA:  Left carotid stent placed for stenosis. EXAM: MRI HEAD WITHOUT CONTRAST TECHNIQUE: Multiplanar, multiecho pulse sequences of the brain and surrounding structures were obtained without intravenous  contrast. COMPARISON:  04/27/2020.  MRI 04/20/2020. FINDINGS: Brain: No acute finding affects the brainstem or cerebellum. Few old small vessel cerebellar infarctions on the left. Old small vessel infarctions of the pons. Right cerebral hemisphere shows atrophy with chronic small-vessel ischemic changes of the white matter. Old small right frontal vertex cortical and subcortical infarction. Left hemisphere shows areas of subacute infarction at the medial left frontoparietal vertex. New areas of acute infarction are present scattered within the basal ganglia and radiating white matter tracts and within the left frontoparietal cortical and subcortical brain. No large confluent infarction. No swelling or hemorrhage. Chronic small-vessel ischemic changes seen elsewhere. No hydrocephalus or extra-axial collection. Vascular: Major vessels at the base of the brain show flow. Skull and upper cervical spine: Negative Sinuses/Orbits: Clear/normal Other: None IMPRESSION: 1. New areas of acute infarction are present scattered within the left basal ganglia and radiating white matter tracts and within the left frontoparietal cortical and subcortical brain. No large confluent infarction. No swelling or hemorrhage. 2. Subacute infarction  in the left medial frontoparietal vertex undergoing expected evolutionary changes. 3. Chronic small-vessel ischemic changes elsewhere throughout the brain as outlined above. Electronically Signed   By: Nelson Chimes M.D.   On: 04/28/2020 12:24    PHYSICAL EXAM    Pleasant elderly Caucasian male sitting up in bed. Not in distress. Surgical wound from TCAR procedure appears to be healing well without any bleeding from the left neck. . Afebrile. Head is nontraumatic. Neck is supple without bruit.    Cardiac exam no murmur or gallop. Lungs are clear to auscultation. Distal pulses are well felt. Neurological Exam :  He is awake alert severe expressive aphasia with anarthria but good comprehension  and follows 1 and occasionally two-step commands. Left gaze preference but is able to look to the right past midline. Blinks to threat on the left but not on the right. Right lower facial weakness. Tongue midline. Right hemi-plegia with right upper extremity 0/5 strength right lower extremity 1-2/5 strength with increased tone. Good strength on the left. Gait not tested  ASSESSMENT/PLAN Mr. NIVAN MELENDREZ is a 79 y.o. male with history of L hemispheric infarcts secondary to symptomatic 80% left proximal carotid stenosis with a soft plaque on long-term AC for blood clotting disorder who underwent L carotid TCAR 04/27/2020. In PACU Post op developed aphasia w/ L gaze and R hemiparesis. Taken for emergent cerebral angio after CT, CTA neg for occlusion.    Stroke:scattered LACA andMCA / ACA territoryinfarcts most likely thrombembolicd/t severe L ICA stenosis followed by elective left carotid TCAR procedure and stenting procedure complicated by new embolic left MCA branch infarcts  MRI admission  Numerous scattered punctate L posterior frontal infarct w/ microembolic infarcts ACA/MCA territory and Punctate infarct corpus callosum. Old L parietal cortical and subcortical infarcts.  CTA head & neck ulcerated L ICA bifurcation w/ severe proximal 80% stenosis. Moderate intradural L VA stenosis. RUL w/ ground glass appearance.   MRI 12/22 (post TCAR) new scattered L basal ganglia, L frontoparietal cortical and subcortical infarcts. Subacute L medial frontoparietal infarct. small vessel disease.    Carotid Doppler  L ICA 80-99%  2D Echo w/ bubble EF 60-65%. No source of embolus   LDL 84  HgbA1c 5.3  VTE prophylaxis - warfarin, full dose lovenox  warfarin daily prior to admission, now on aspirin, plavix,warfarin daily w/ lovenox bridge-> given new strokes, change full dose lovenox to heparin gtt and bridge to warfarin. Continue aspirin, plavix with warfarin x 1 mo.  Therapy recommendations:   CIR->reassessment pending   Disposition:  pending   follow up with stroke clinic NP at [place] 4 weeks following rehab discharge  Symptomatic L ICA Stenosis   Carotid Doppler  L ICA 80-99%  VVS consulted Trula Slade)  TCAR 04/27/2020  Post op neuro worsening  CT, CTA, Cerebral angio all neg for occlusion. MRI shows new L brain scattered small infarcts  Per Dr. Trula Slade, ASA, plavix and anticoagulation triple therapy for one month.   Blood Clotting D/O Hx DVT, PE  Home anticoagulation:  warfarin daily, continued in the hospital  Admission INR 2.2  LE doppler partial non-obstructed age indeterminate DVT R common femoral and R popliteal vein, likely chronic   Post TCAR, on lovenox bridge->change to IV heparin d/t new strokes and new AF  INR 1.3  Atrial Fibrillation, new dx  Home anticoagulation:  warfarin daily for clotting d/o, hx PE, DVT  INR 1.3   CHA2DS2-VASc Score = 5, ?2 oral anticoagulation recommended  Age in Years:  ?  42   +2    Sex:  Male   0    Hypertension History:   +1     Diabetes Mellitus:  0  Congestive Heart Failure History:  0  Vascular Disease History:  0     Stroke/TIA/Thromboembolism History:  yes   +2 .  heparin gtt and bridge to warfarin. Continue aspirin, plavix with warfarin x 1 mo.   Hypertension  Off Cleviprex gtt   On coreg bid  BP at goal now <160  Long term goal normotensive  Hyperlipidemia  Home meds:  pravachol 20  Now on pravachol 40  LDL 84, goal < 70  Continue statin at discharge  Dysphagia . Secondary to stroke . Cleared for D1 and meds in puree . For MBSS 12/24 . May need cortrak if performs poorly. Discussed w/ wife and daughter . Speech on board   Other Stroke Risk Factors  Advanced Age >/= 14   Former Cigarette smoker  ETOH use, advised to drink no more than 2 drink(s) a day  Other Active Problems  Aphasia and right hemiplegia lab  Hospital day # 8 Recommend discontinue Lovenox as patient had  paroxysmal A. fib and switch to IV heparin bridge and continue warfarin until INR is optimal then stop IV heparin.  Continue aspirin and Plavix for 30 days due to fresh carotid stent and then stop Plavix after that.  Continue ongoing physical occupational and speech therapy.  Likely discharge to rehab early next week.  Discussed with Dr. Jacki Cones, patient's wife and daughter and answered questions about his care.  Greater than 50% time during this 25-minute visit was spent on counseling and coordination of care about his stroke and aphasia and hemiparesis and answering questions Edelmira Gallogly,MD    To contact Stroke Continuity provider, please refer to [email or web address] After hours, contact General Neurology

## 2020-04-29 NOTE — Progress Notes (Signed)
Physical Therapy Treatment Patient Details Name: John Lambert MRN: [id number] DOB: 21-Mar-1941 Today's Date: 04/29/2020    History of Present Illness John Lambert is a 79 y.o. male admitted 12/14 with bil LE weakness and difficulty walking as well as left hip pain. Imaging notable for Numerous scattered punctate acute infarctions in the left  posterior frontal region consistent with micro embolic infarctions. Punctate acute infarction also affecting the splenium of  the corpus callosum.  Pt with L carotid artery stenosis s/p stent on 04/27/20. Post procedure pt with right hemiparesis, aphasia and left gaze with intraprocedural stroke. 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    PT Comments    Pt tolerates treatent well but demonstrates more significant deficits compared to previous session. Pt with significant R weakness and expressive aphasia. Pt currently requires significant physical assistance to perform all functional mobility tasks and to maintain balance. Pt reports significant fatigue which may be understandable as he has been through multiple therapies and tests today. PT downgrades goals and extends POC according to the pt's current functional level. Pt will benefit from continued acute PT services to improve functional mobility and reduce caregiver burden. PT continues to recommend CIR admission at the time of discharge.  Follow Up Recommendations  CIR     Equipment Recommendations  Wheelchair (measurements PT);Wheelchair cushion (measurements PT);Hospital bed (mechanical lift, all if D/C home)    Recommendations for Other Services       Precautions / Restrictions Precautions Precautions: Fall Restrictions Weight Bearing Restrictions: No    Mobility  Bed Mobility Overal bed mobility: Needs Assistance Bed Mobility: Supine to Sit     Supine to sit: Max assist;HOB elevated        Transfers Overall  transfer level: Needs assistance Equipment used: 1 person hand held assist Transfers: Sit to/from [employer] Transfers Sit to Stand: Max assist;From elevated surface   Squat pivot transfers: Max assist;From elevated surface     General transfer comment: PT provides R knee block and BUE support, encourages hip knee and cervical spine extension with verbal cues. Pt performed one squat pivot transfer and 4 sit to stand trials  Ambulation/Gait Ambulation/Gait assistance:  (deferred 2/2 safety concerns)               Stairs             Wheelchair Mobility    Modified Rankin (Stroke Patients Only) Modified Rankin (Stroke Patients Only) Pre-Morbid Rankin Score: No significant disability Modified Rankin: Severe disability     Balance Overall balance assessment: Needs assistance Sitting-balance support: Single extremity supported;Feet supported Sitting balance-Leahy Scale: Poor Sitting balance - Comments: min-modA with UE support of bed Postural control: Right lateral lean Standing balance support: Bilateral upper extremity supported Standing balance-Leahy Scale: Zero Standing balance comment: maxA with BUE support and R knee block                            Cognition Arousal/Alertness: Awake/alert Behavior During Therapy: Flat affect Overall Cognitive Status: Impaired/Different from baseline Area of Impairment: Attention;Following commands;Problem solving                   Current Attention Level: Sustained   Following Commands: Follows one step commands with increased time     Problem Solving: Slow processing;Requires verbal cues;Requires tactile cues General Comments: pt communicating through head nods this session  Exercises      General Comments General comments (skin integrity, edema, etc.): spouse and daughter present. Pt on 2L [place] throughout session with VSS      Pertinent Vitals/Pain Pain Assessment: Faces Faces Pain  Scale: No hurt    Home Living                      Prior Function            PT Goals (current goals can now be found in the care plan section) Acute Rehab PT Goals Patient Stated Goal: improve mobility PT Goal Formulation: With patient/family Time For Goal Achievement: 05/13/20 Potential to Achieve Goals: Fair Progress towards PT goals: Goals downgraded-see care plan    Frequency    Min 4X/week      PT Plan Current plan remains appropriate    Co-evaluation              AM-PAC PT "6 Clicks" Mobility   Outcome Measure  Help needed turning from your back to your side while in a flat bed without using bedrails?: A Lot Help needed moving from lying on your back to sitting on the side of a flat bed without using bedrails?: A Lot Help needed moving to and from a bed to a chair (including a wheelchair)?: A Lot Help needed standing up from a chair using your arms (e.g., wheelchair or bedside chair)?: A Lot Help needed to walk in hospital room?: Total Help needed climbing 3-5 steps with a railing? : Total 6 Click Score: 10    End of Session   Activity Tolerance: Patient tolerated treatment well Patient left: in chair;with call bell/phone within reach;with chair alarm set;with family/visitor present Nurse Communication: Mobility status;Need for lift equipment PT Visit Diagnosis: Other abnormalities of gait and mobility (R26.89);Muscle weakness (generalized) (M62.81);History of falling (Z91.81)     Time: [id number] PT Time Calculation (min) (ACUTE ONLY): 42 min  Charges:  $Therapeutic Activity: 38-52 mins                     Zenaida Niece, PT, DPT Acute Rehabilitation Pager: [phone]    Zenaida Niece 04/29/2020, 2:38 PM

## 2020-04-30 LAB — GLUCOSE, CAPILLARY
Glucose-Capillary: 125 mg/dL — ABNORMAL HIGH (ref 70–99)
Glucose-Capillary: 127 mg/dL — ABNORMAL HIGH (ref 70–99)
Glucose-Capillary: 134 mg/dL — ABNORMAL HIGH (ref 70–99)
Glucose-Capillary: 138 mg/dL — ABNORMAL HIGH (ref 70–99)
Glucose-Capillary: 142 mg/dL — ABNORMAL HIGH (ref 70–99)
Glucose-Capillary: 148 mg/dL — ABNORMAL HIGH (ref 70–99)

## 2020-04-30 LAB — CBC
HCT: 39.5 % (ref 39.0–52.0)
Hemoglobin: 12.8 g/dL — ABNORMAL LOW (ref 13.0–17.0)
MCH: 29.6 pg (ref 26.0–34.0)
MCHC: 32.4 g/dL (ref 30.0–36.0)
MCV: 91.4 fL (ref 80.0–100.0)
Platelets: 200 10*3/uL (ref 150–400)
RBC: 4.32 MIL/uL (ref 4.22–5.81)
RDW: 13 % (ref 11.5–15.5)
WBC: 11.3 10*3/uL — ABNORMAL HIGH (ref 4.0–10.5)
nRBC: 0 % (ref 0.0–0.2)

## 2020-04-30 LAB — COMPREHENSIVE METABOLIC PANEL
ALT: 29 U/L (ref 0–44)
AST: 27 U/L (ref 15–41)
Albumin: 2.8 g/dL — ABNORMAL LOW (ref 3.5–5.0)
Alkaline Phosphatase: 39 U/L (ref 38–126)
Anion gap: 6 (ref 5–15)
BUN: 16 mg/dL (ref 8–23)
CO2: 23 mmol/L (ref 22–32)
Calcium: 8 mg/dL — ABNORMAL LOW (ref 8.9–10.3)
Chloride: 108 mmol/L (ref 98–111)
Creatinine, Ser: 0.76 mg/dL (ref 0.61–1.24)
GFR, Estimated: >60 mL/min (ref >60)
Glucose, Bld: 141 mg/dL — ABNORMAL HIGH (ref 70–99)
Potassium: 3.7 mmol/L (ref 3.5–5.1)
Sodium: 137 mmol/L (ref 135–145)
Total Bilirubin: 1.3 mg/dL — ABNORMAL HIGH (ref 0.3–1.2)
Total Protein: 5.4 g/dL — ABNORMAL LOW (ref 6.5–8.1)

## 2020-04-30 LAB — PROTIME-INR
INR: 1.2 (ref 0.8–1.2)
Prothrombin Time: 15.1 seconds (ref 11.4–15.2)

## 2020-04-30 LAB — HEPARIN LEVEL (UNFRACTIONATED)
Heparin Unfractionated: 0.27 IU/mL — ABNORMAL LOW (ref 0.30–0.70)
Heparin Unfractionated: 0.28 IU/mL — ABNORMAL LOW (ref 0.30–0.70)

## 2020-04-30 MED ORDER — WARFARIN SODIUM 5 MG PO TABS
5.0000 mg | ORAL_TABLET | Freq: Once | ORAL | Status: AC
  Administered 2020-04-30: 16:00:00 5 mg via ORAL
  Filled 2020-04-30: qty 1

## 2020-04-30 NOTE — Progress Notes (Signed)
[place] for heparin gtt, warfarin Indication: on PTA warfarin for hx of DVT/PE, new acute stroke, new Afib  Allergies  Allergen Reactions  . Hctz [Hydrochlorothiazide] Other (See Comments)    dizzy  . Hydrocodone Itching  . Vicodin [Hydrocodone-Acetaminophen] Itching    Patient Measurements: Height: 6\' 2"  (188 cm) Weight: 79.7 kg (175 lb 11.3 oz) IBW/kg (Calculated) : 82.2  Heparin weight: 75.8 kg  Vital Signs: Temp: 97.7 F (36.5 C) (12/25 0353) Temp Source: Oral (12/25 0353) BP: 133/83 (12/25 0353) Pulse Rate: 91 (12/25 0353)  Labs: Recent Labs    04/28/20 0500 04/28/20 1600 04/29/20 0301 04/29/20 1852 04/30/20 0525  HGB  --   --  13.8  --  12.8*  HCT  --   --  40.9  --  39.5  PLT  --   --  192  --  200  LABPROT 14.5  --  15.5*  --  15.1  INR 1.2  --  1.3*  --  1.2  HEPARINUNFRC  --   --   --  0.25* 0.27*  CREATININE  --  0.77 0.77  --   --     Estimated Creatinine Clearance: 81.6 mL/min (by C-G formula based on SCr of 0.77 mg/dL).  Assessment: 79 yo male with L symptomatic carotid stenosis.  S/p L carotid TCAR procedure w/ stenting that was complicated by postprocedure stroke.  MRI brain shows patchy L basal ganglia and L frontoparietal cortical and subcortical infarcts (new).  No hemorrhagic transformation.  Pharmacy consulted to switch treatment dose Lovenox to heparin drip given new stroke and new onset atrial fibrillation.   PTA warfarin dose: 2.5mg  daily except 1.25mg  on Mondays  Heparin level slightly subtherapeutic (0.27) on gtt at 1050 units/hr. No issues with line or bleeding reported per RN.  Goal of Therapy:  Heparin level 0.3-0.5 units/ml (more narrow goal due to recent stroke) Monitor platelets by anticoagulation protocol: Yes  INR goal 2-3   Plan: Increase heparin rate to 1150 units/hr  Will f/u 8 hr heparin level  Sherlon Handing, PharmD, BCPS Please see amion for complete clinical pharmacist phone  list 04/30/2020 5:58 AM

## 2020-04-30 NOTE — Progress Notes (Signed)
STROKE TEAM PROGRESS NOTE   INTERVAL HISTORY Patient is sitting up in bed.  Continues to have severe expressive aphasia in right hemiparesis.  Is able to look to the right past midline none.  Vital signs are stable.  Neurological exam is unchanged.  His wife is at the bedside.  He pulled out core track tube yesterday speech therapy has given him a dysphagia diet with pured thick liquids.  Remains on IV heparin level this morning is 0.25.  INR is 1.3.  Vitals:   04/29/20 2343 04/30/20 0353 04/30/20 0500 04/30/20 0747  BP: 114/75 133/83  131/75  Pulse: 79 91  83  Resp: 17 17  20   Temp: 99.4 F (37.4 C) 97.7 F (36.5 C)  98.3 F (36.8 C)  TempSrc: Oral Oral  Oral  SpO2: 97% 97%  98%  Weight:   87.2 kg   Height:       CBC:  Recent Labs  Lab 04/29/20 0301 04/30/20 0525  WBC 11.0* 11.3*  HGB 13.8 12.8*  HCT 40.9 39.5  MCV 90.5 91.4  PLT 192 093   Basic Metabolic Panel:  Recent Labs  Lab 04/26/20 0210 04/26/20 2108 04/27/20 0253 04/28/20 1600 04/29/20 0301 04/30/20 0525  NA 138   < > 141 139 137 137  K 3.8   < > 4.0 3.9 4.0 3.7  CL 105   < > 105 107 106 108  CO2 22   < > 26 22 22 23   GLUCOSE 96   < > 98 100* 121* 141*  BUN 16   < > 13 16 17 16   CREATININE 0.77   < > 0.91 0.77 0.77 0.76  CALCIUM 8.3*   < > 8.7* 8.2* 8.3* 8.0*  MG 1.9  --  2.1 1.9  --   --   PHOS 3.6  --  3.1  --   --   --    < > = values in this interval not displayed.   Lipid Panel:  Recent Labs  Lab 04/28/20 0135  CHOL 104  TRIG 66  HDL 30*  CHOLHDL 3.5  VLDL 13  LDLCALC 61   HgbA1c:  Recent Labs  Lab 04/28/20 0135  HGBA1C 5.1   Urine Drug Screen: No results for input(s): LABOPIA, COCAINSCRNUR, LABBENZ, AMPHETMU, THCU, LABBARB in the last 168 hours.  Alcohol Level No results for input(s): ETH in the last 168 hours.  IMAGING past 24 hours No results found.  PHYSICAL EXAM    Pleasant elderly Caucasian male sitting up in bed. Not in distress. Surgical wound from TCAR procedure  appears to be healing well without any bleeding from the left neck. . Afebrile. Head is nontraumatic. Neck is supple without bruit.    Cardiac exam no murmur or gallop. Lungs are clear to auscultation. Distal pulses are well felt. Neurological Exam :  He is awake alert severe expressive aphasia with anarthria but good comprehension and follows 1 and occasionally two-step commands. Left gaze preference but is able to look to the right past midline. Blinks to threat on the left but not on the right. Right lower facial weakness. Tongue midline. Right hemi-plegia with right upper extremity 0/5 strength right lower extremity 1-2/5 strength with increased tone. Good strength on the left. Gait not tested  ASSESSMENT/PLAN Mr. John Lambert is a 79 y.o. male with history of L hemispheric infarcts secondary to symptomatic 80% left proximal carotid stenosis with a soft plaque on long-term AC for blood clotting disorder who  underwent L carotid TCAR 04/27/2020. In PACU Post op developed aphasia w/ L gaze and R hemiparesis. Taken for emergent cerebral angio after CT, CTA neg for occlusion.    Stroke:scattered LACA andMCA / ACA territoryinfarcts most likely thrombembolicd/t severe L ICA stenosis followed by elective left carotid TCAR procedure and stenting procedure complicated by new embolic left MCA branch infarcts  MRI admission  Numerous scattered punctate L posterior frontal infarct w/ microembolic infarcts ACA/MCA territory and Punctate infarct corpus callosum. Old L parietal cortical and subcortical infarcts.  CTA head & neck ulcerated L ICA bifurcation w/ severe proximal 80% stenosis. Moderate intradural L VA stenosis. RUL w/ ground glass appearance.   MRI 12/23 (post TCAR) new scattered L basal ganglia, L frontoparietal cortical and subcortical infarcts. Subacute L medial frontoparietal infarct. small vessel disease.    Carotid Doppler  L ICA 80-99%  2D Echo w/ bubble EF 60-65%. No source of  embolus   LDL 84  HgbA1c 5.3  VTE prophylaxis - warfarin / Heparin IV  warfarin daily prior to admission, now on  heparin gtt and bridge to warfarin. Continue aspirin, plavix with warfarin x 1 mo.  Therapy recommendations:  CIR->reassessment pending   Disposition:  pending   follow up with stroke clinic NP at [place] 4 weeks following rehab discharge  Symptomatic L ICA Stenosis   Carotid Doppler  L ICA 80-99%  VVS consulted Trula Slade)  TCAR 04/27/2020  Post op neuro worsening  CT, CTA, Cerebral angio all neg for occlusion. MRI shows new L brain scattered small infarcts  Per Dr. Trula Slade, ASA, plavix and anticoagulation triple therapy for one month.   Blood Clotting D/O Hx DVT, PE  Home anticoagulation:  warfarin daily, continued in the hospital  Admission INR 2.2  LE doppler partial non-obstructed age indeterminate DVT R common femoral and R popliteal vein, likely chronic   Post TCAR, on lovenox bridge->change to IV heparin d/t new strokes and new AF  INR 1.3  Atrial Fibrillation, new dx  Home anticoagulation:  warfarin daily for clotting d/o, hx PE, DVT  INR 1.3   CHA2DS2-VASc Score = 5, ?2 oral anticoagulation recommended  Age in Years:  ?81   +2    Sex:  Male   0    Hypertension History:   +1     Diabetes Mellitus:  0  Congestive Heart Failure History:  0  Vascular Disease History:  0     Stroke/TIA/Thromboembolism History:  yes   +2 .  heparin gtt and bridge to warfarin. Continue aspirin, plavix with warfarin x 1 mo.   Hypertension  Off Cleviprex gtt   On coreg bid  BP at goal now <160  Long term goal normotensive  Hyperlipidemia  Home meds:  pravachol 20  Now on pravachol 40  LDL 84, goal < 70  Continue statin at discharge  Dysphagia . Secondary to stroke . Cleared for D1 and meds in puree . For MBSS 12/24 . May need cortrak if performs poorly. Discussed w/ wife and daughter . Speech on board   Other Stroke Risk Factors  Advanced  Age >/= 49   Former Cigarette smoker  ETOH use, advised to drink no more than 2 drink(s) a day  Other Active Problems, Findings and Recommendations  Aphasia and right hemiplegia lab  Pt removed Cortrak last night. (just placed 12/24 at noon) Not sure if cortrak replacement is available on weekend. Will ask nursing to check. Monitor for signs or symptoms of aspiration.  Awaiting Inpt  Rehab bed - hopefully next week.  Mild leukocytosis - WBC's 11.3 - T Max 99.4 Monitor for signs or symptoms of aspiration.  New transient paroxysmal A. fib this admission.   Hospital day # 9 Continue IV heparin drip till INR is optimal between 2-3.  Continue aspirin and Plavix for 30 days for his fresh carotid stent then aspirin alone.  Speech therapy to continue following his swallowing.  Hopefully transfer to inpatient rehab next week when bed available.  Long discussion with patient and wife and answered questions.  Greater than 50% time during this 25-minute visit spent in counseling and coordination of care about his stroke and carotid revascularization procedure complications and answering questions. Delia Heady, MD    To contact Stroke Continuity provider, please refer to [email or web address]. After hours, contact General Neurology

## 2020-04-30 NOTE — Progress Notes (Signed)
   I have visited with patient and his wife.  He was mostly somnolent during our discussion.  Unfortunately does not seem to have any improvement from yesterday and did pull his cortrak overnight.  Vascular surgery continue to follow.  Yurianna Tusing C. Donzetta Matters, MD [facility] Office: [phone] Pager: [phone]

## 2020-04-30 NOTE — Progress Notes (Signed)
[place] for heparin infusion, warfarin Indication: on PTA warfarin for hx of DVT/PE, new acute stroke, new Afib  Allergies  Allergen Reactions  . Hctz [Hydrochlorothiazide] Other (See Comments)    dizzy  . Hydrocodone Itching  . Vicodin [Hydrocodone-Acetaminophen] Itching    Patient Measurements: Height: 6\' 2"  (188 cm) Weight: 87.2 kg (192 lb 3.9 oz) IBW/kg (Calculated) : 82.2  Heparin weight: 75.8 kg  Vital Signs: Temp: 97.8 F (36.6 C) (12/25 1137) Temp Source: Oral (12/25 1137) BP: 112/64 (12/25 1137) Pulse Rate: 80 (12/25 1137)  Labs: Recent Labs    04/28/20 0500 04/28/20 1600 04/29/20 0301 04/29/20 1852 04/30/20 0525 04/30/20 1400  HGB  --   --  13.8  --  12.8*  --   HCT  --   --  40.9  --  39.5  --   PLT  --   --  192  --  200  --   LABPROT 14.5  --  15.5*  --  15.1  --   INR 1.2  --  1.3*  --  1.2  --   HEPARINUNFRC  --   --   --  0.25* 0.27* 0.28*  CREATININE  --  0.77 0.77  --  0.76  --     Estimated Creatinine Clearance: 84.2 mL/min (by C-G formula based on SCr of 0.76 mg/dL).  Assessment: 79 yo male with L symptomatic carotid stenosis.  S/p L carotid TCAR procedure w/ stenting that was complicated by postprocedure stroke.  MRI brain shows patchy L basal ganglia and L frontoparietal cortical and subcortical infarcts (new).  No hemorrhagic transformation.  Pharmacy consulted to switch treatment dose Lovenox to heparin drip given new stroke and new onset atrial fibrillation.   PTA warfarin dose: 2.5mg  daily except 1.25mg  on Mondays  04/30/20 afternoon: INR subtherapeutic at 1.2.  Yesterday's warfarin dose was ordered correctly but not administered. Heparin level continues to be slightly subtherapeutic (0.28) with heparin infusing at 1150 units/hr. No significant issues with line or bleeding reported per RN (12/25 am: infusion stopped for less than an hour this morning and minor old drainage at IV site).  Goal of  Therapy:  Heparin level 0.3-0.5 units/ml (more narrow goal due to recent stroke) Monitor platelets by anticoagulation protocol: Yes  INR goal 2-3   Plan: Increase heparin rate to 1250 units/hr  Follow-up 8 hr heparin level Warfarin 5 mg po x 1 today Monitor daily INR and heparin level, CBC, s/s bleeding   John Lambert, PharmD, [place] Please utilize Amion for appropriate phone number to reach the unit pharmacist ([place]) 04/30/2020 2:28 PM

## 2020-04-30 NOTE — Progress Notes (Addendum)
PROGRESS NOTE    John Lambert  MGQ:[id number] DOB: [birth year] DOA: 04/19/2020 PCP: Harlan Stains, MD    Brief Narrative:79 y.o.malewith PMH significant for hypertension, melanoma, neurofibroma, clotting disorder, history of DVT and PE on chronic Coumadin who presented to the ED on 12/14 with c/o : bilateral lower extremity weakness and difficulty ambulating associated with pain in the left hip region.(has a history of neurofibroma tumor removal from his spine about 4 years ago and melanoma removal from right calf about 30 years ago.) MRI brain obtained noted numerous scattered punctate acute infarctions in the left posterior frontal region consistent with micro embolic infarctions which could either be in the anterior or middle cerebral artery territory. Punctate acute infarction also affecting the splenium ofthe corpus callosum. Extensive chronic small-vessel ischemic changes elsewherethroughout the brain. Small old left parietal vertex cortical and subcorticalInfarctions.  MRI lumbar spine showed chronic arachnoiditis with adhesions in the thecal sac and distorted cauda equina nerve roots from L2-L3 to the sacrum, but no evidence of recurrent spinal tumor.  Possible right L5/S1 radiculitis after noting L4/5 multifactorial mild to moderate spinal stenosis.  Vascular surgery was consulted and took patient on 12/22 for a left transcarotid artery stenting.  Patient found to have 80% carotid stenosis and then following procedure down to less than 10% with stent.  However, following surgery patient noted to have difficulty moving right side expressive aphasia and left gaze deviation and code stroke immediately called.  Patient taken straight to CT scan.  No new acute infarction noted.  Patient underwent emergent left carotid diagnostic cerebral catheter angiogram with left MCA embolism noted from previous, but no acute findings.  No intervention planned.  Patient sent to medical ICU in the absence of  beds in the neuro ICU.  EEG noted cortical dysfunction left hemisphere, but no seizures or epileptiform discharges seen throughout recording.  Patient transferred to stroke service on 12/22 Transferred out to [facility] service 04/29/2020    Assessment & Plan:   Principal Problem:   Weakness Active Problems:   Anticoagulated on warfarin   Impaired ambulation   Fall at home, initial encounter   Essential hypertension   Blood clotting disorder ([place])   Cerebral embolism with cerebral infarction   Stroke [facility])   Middle cerebral artery embolism, left   #1  Acute CVA on presentation from left carotid artery stenosis status post left carotid stenting with complications with possible new stroke.  He underwent elective left carotid transcranial procedure with stenting complicated by postop stroke. He had an emergent CT angiogram and cerebral catheter angiogram which did not reveal large vessel occlusion. EEG did not show any epileptiform activities.  MRI of the brain showed patchy left basal ganglia as well as left frontoparietal cortical and subcortical infarcts which are new in addition to the previous recent left MCA frontal infarct which was subacute. Seen by speech therapy recommending modified barium swallow. PT OT recommends CIR. Hemoglobin A1c is 5.3, LDL is 84, echo shows normal ejection fraction with no source of emboli. Carotid Doppler with left ICA 80 to 99%. He is currently on heparin and Coumadin till INR is therapeutic. Patient is on aspirin Plavix heparin and Coumadin.  Aspirin and Plavix to be continued for 30 days together with Coumadin And then to continue aspirin and Coumadin. Patient with ongoing dysphagia dysarthria/aphasia and dense right hemiplegia.  [place] TUBE PLACED 12/24 Patient pulled out for core track tube overnight.  #2 left carotid artery stenosis vascular surgery was consulted he is status  post carotid artery stenting.  #3  History of clotting disorder  including PE DVT was on Coumadin prior to admission.  Doppler of the lower extremity shows chronic DVT.  Continue heparin and Coumadin.  #4 hyperlipidemia LDL 84 continue Pravachol 40 mg daily.  Continue the same dose upon discharge.  #5 paroxysmal A. fib CHA2DS2-VASc score above 5 continue Coumadin on discharge.  INR still 1.2 on heparin and Coumadin.  Pharmacy adjusting dose of both.  #6 history of essential hypertension on Coreg twice a day.  Off of Cleviprex drip.  #7 nutrition patient pulled out a core track tube 1224 night.  Patient had MBS done on 04/29/2020 with findings consistent with silent aspiration. Contract to be placed today again.    Nutrition Problem: Inadequate oral intake Etiology: dysphagia     Signs/Symptoms: NPO status    Interventions: Tube feeding  Estimated body mass index is 24.68 kg/m as calculated from the following:   Height as of this encounter: 6\' 2"  (1.88 m).   Weight as of this encounter: 87.2 kg.  DVT prophylaxis: Heparin and Coumadin  code Status: Full code Family Communication: None at bedside  disposition Plan:  Status is: Inpatient   Dispo: The patient is from: Home              Anticipated d/c is to: Home              Anticipated d/c date is: > 3 days              Patient currently is not medically stable to d/c.   Consultants: Vascular surgery, neurology    Procedures: None Antimicrobials: None  Subjective: Patient resting in bed wife by the bedside.  She spent the night here with him.  She reported that he pulled out the NG tube overnight.  She is also telling me that he had thick cranberry jelly without any problems. He is awake and seems to listen to our conversation and follow me with his eyes and notes his head up and down his answering my questions yes and no with nodding his head.  Objective: Vitals:   04/29/20 2343 04/30/20 0353 04/30/20 0500 04/30/20 0747  BP: 114/75 133/83  131/75  Pulse: 79 91  83  Resp: 17  17  20   Temp: 99.4 F (37.4 C) 97.7 F (36.5 C)  98.3 F (36.8 C)  TempSrc: Oral Oral  Oral  SpO2: 97% 97%  98%  Weight:   87.2 kg   Height:        Intake/Output Summary (Last 24 hours) at 04/30/2020 0949 Last data filed at 04/30/2020 0230 Gross per 24 hour  Intake 1622.18 ml  Output --  Net 1622.18 ml   Filed Weights   04/20/20 0037 04/20/20 2034 04/30/20 0500  Weight: 75.8 kg 79.7 kg 87.2 kg    Examination:  General exam: Appears calm and comfortable  Respiratory system: Clear to auscultation. Respiratory effort normal. Cardiovascular system: S1 & S2 heard, RRR. No JVD, murmurs, rubs, gallops or clicks. No pedal edema. Gastrointestinal system: Abdomen is nondistended, soft and nontender. No organomegaly or masses felt. Normal bowel sounds heard. Central nervous system: Awake aphasic right upper and lower extremity hemiplegia noted  extremities: Symmetric 5 x 5 power. Skin: No rashes, lesions or ulcers Psychiatry: Unable to assess   Data Reviewed: I have personally reviewed following labs and imaging studies  CBC: Recent Labs  Lab 04/26/20 0210 04/26/20 2108 04/27/20 0253 04/29/20 0301 04/30/20 ID:[id number]  WBC 7.2 6.9 6.8 11.0* 11.3*  HGB 15.3 16.2 15.9 13.8 12.8*  HCT 45.0 49.2 46.9 40.9 39.5  MCV 89.5 89.6 91.1 90.5 91.4  PLT 216 224 226 192 [id number]   Basic Metabolic Panel: Recent Labs  Lab 04/26/20 0210 04/26/20 2108 04/27/20 0253 04/28/20 1600 04/29/20 0301 04/30/20 0525  NA 138 137 141 139 137 137  K 3.8 4.1 4.0 3.9 4.0 3.7  CL 105 104 105 107 106 108  CO2 22 24 26 22 22 23   GLUCOSE 96 113* 98 100* 121* 141*  BUN 16 15 13 16 17 16   CREATININE 0.77 0.87 0.91 0.77 0.77 0.76  CALCIUM 8.3* 8.6* 8.7* 8.2* 8.3* 8.0*  MG 1.9  --  2.1 1.9  --   --   PHOS 3.6  --  3.1  --   --   --    GFR: Estimated Creatinine Clearance: 84.2 mL/min (by C-G formula based on SCr of 0.76 mg/dL). Liver Function Tests: Recent Labs  Lab 04/26/20 2108 04/30/20 0525  AST 30  27  ALT 28 29  ALKPHOS 52 39  BILITOT 0.8 1.3*  PROT 6.3* 5.4*  ALBUMIN 3.5 2.8*   No results for input(s): LIPASE, AMYLASE in the last 168 hours. No results for input(s): AMMONIA in the last 168 hours. Coagulation Profile: Recent Labs  Lab 04/26/20 2108 04/27/20 0253 04/28/20 0500 04/29/20 0301 04/30/20 0525  INR 1.3* 1.3* 1.2 1.3* 1.2   Cardiac Enzymes: No results for input(s): CKTOTAL, CKMB, CKMBINDEX, TROPONINI in the last 168 hours. BNP (last 3 results) No results for input(s): PROBNP in the last 8760 hours. HbA1C: Recent Labs    04/28/20 0135  HGBA1C 5.1   CBG: Recent Labs  Lab 04/29/20 1532 04/29/20 2011 04/29/20 2339 04/30/20 0350 04/30/20 0748  GLUCAP 152* 141* 130* 125* 142*   Lipid Profile: Recent Labs    04/28/20 0135  CHOL 104  HDL 30*  LDLCALC 61  TRIG 66  CHOLHDL 3.5   Thyroid Function Tests: No results for input(s): TSH, T4TOTAL, FREET4, T3FREE, THYROIDAB in the last 72 hours. Anemia Panel: No results for input(s): VITAMINB12, FOLATE, FERRITIN, TIBC, IRON, RETICCTPCT in the last 72 hours. Sepsis Labs: No results for input(s): PROCALCITON, LATICACIDVEN in the last 168 hours.  Recent Results (from the past 240 hour(s))  MRSA PCR Screening     Status: None   Collection Time: 04/27/20  3:39 PM   Specimen: Nasopharyngeal  Result Value Ref Range Status   MRSA by PCR NEGATIVE NEGATIVE Final    Comment:        The GeneXpert MRSA Assay (FDA approved for NASAL specimens only), is one component of a comprehensive MRSA colonization surveillance program. It is not intended to diagnose MRSA infection nor to guide or monitor treatment for MRSA infections. Performed at [facility], [place] [address], [place], [place] [address]          Radiology Studies: MR BRAIN WO CONTRAST  Result Date: 04/28/2020 CLINICAL DATA:  Left carotid stent placed for stenosis. EXAM: MRI HEAD WITHOUT CONTRAST TECHNIQUE: Multiplanar, multiecho pulse  sequences of the brain and surrounding structures were obtained without intravenous contrast. COMPARISON:  04/27/2020.  MRI 04/20/2020. FINDINGS: Brain: No acute finding affects the brainstem or cerebellum. Few old small vessel cerebellar infarctions on the left. Old small vessel infarctions of the pons. Right cerebral hemisphere shows atrophy with chronic small-vessel ischemic changes of the white matter. Old small right frontal vertex cortical and subcortical infarction. Left  hemisphere shows areas of subacute infarction at the medial left frontoparietal vertex. New areas of acute infarction are present scattered within the basal ganglia and radiating white matter tracts and within the left frontoparietal cortical and subcortical brain. No large confluent infarction. No swelling or hemorrhage. Chronic small-vessel ischemic changes seen elsewhere. No hydrocephalus or extra-axial collection. Vascular: Major vessels at the base of the brain show flow. Skull and upper cervical spine: Negative Sinuses/Orbits: Clear/normal Other: None IMPRESSION: 1. New areas of acute infarction are present scattered within the left basal ganglia and radiating white matter tracts and within the left frontoparietal cortical and subcortical brain. No large confluent infarction. No swelling or hemorrhage. 2. Subacute infarction in the left medial frontoparietal vertex undergoing expected evolutionary changes. 3. Chronic small-vessel ischemic changes elsewhere throughout the brain as outlined above. Electronically Signed   By: Nelson Chimes M.D.   On: 04/28/2020 12:24        Scheduled Meds: . aspirin  81 mg Oral Daily  . carvedilol  25 mg Oral BID WC  . Chlorhexidine Gluconate Cloth  6 each Topical Daily  . clopidogrel  75 mg Oral Daily  . feeding supplement (PROSource TF)  45 mL Per Tube Daily  . pravastatin  40 mg Oral Daily  . warfarin  2.5 mg Oral ONCE-1600  . Warfarin - Pharmacist Dosing Inpatient   Does not apply q1600    Continuous Infusions: . dextrose 5 % and 0.9% NaCl 70 mL/hr at 04/29/20 0554  . feeding supplement (JEVITY 1.2 CAL) 1,000 mL (04/29/20 1402)  . heparin 1,150 Units/hr (04/30/20 0620)     LOS: 9 days     Georgette Shell, MD 04/30/2020, 9:49 AM

## 2020-04-30 NOTE — Plan of Care (Signed)
  Problem: Health Behavior/Discharge Planning: Goal: Ability to manage health-related needs will improve Outcome: Progressing   Problem: Clinical Measurements: Goal: Will remain free from infection Outcome: Progressing Goal: Diagnostic test results will improve Outcome: Progressing Goal: Cardiovascular complication will be avoided Outcome: Progressing   Problem: Activity: Goal: Risk for activity intolerance will decrease Outcome: Progressing   Problem: Nutrition: Goal: Adequate nutrition will be maintained Outcome: Progressing   Problem: Coping: Goal: Level of anxiety will decrease Outcome: Progressing   Problem: Elimination: Goal: Will not experience complications related to bowel motility Outcome: Progressing Goal: Will not experience complications related to urinary retention Outcome: Progressing   Problem: Pain Managment: Goal: General experience of comfort will improve Outcome: Progressing   Problem: Safety: Goal: Ability to remain free from injury will improve Outcome: Progressing   Problem: Skin Integrity: Goal: Risk for impaired skin integrity will decrease Outcome: Progressing   Problem: Education: Goal: Knowledge of disease or condition will improve Outcome: Progressing Goal: Knowledge of secondary prevention will improve Outcome: Progressing   Problem: Coping: Goal: Will verbalize positive feelings about self Outcome: Progressing Goal: Will identify appropriate support needs Outcome: Progressing   Problem: Health Behavior/Discharge Planning: Goal: Ability to manage health-related needs will improve Outcome: Progressing   Problem: Self-Care: Goal: Verbalization of feelings and concerns over difficulty with self-care will improve Outcome: Progressing   Problem: Ischemic Stroke/TIA Tissue Perfusion: Goal: Complications of ischemic stroke/TIA will be minimized Outcome: Progressing

## 2020-05-01 LAB — PROTIME-INR
INR: 1.3 — ABNORMAL HIGH (ref 0.8–1.2)
Prothrombin Time: 15.7 seconds — ABNORMAL HIGH (ref 11.4–15.2)

## 2020-05-01 LAB — CBC
HCT: 37.4 % — ABNORMAL LOW (ref 39.0–52.0)
Hemoglobin: 12.5 g/dL — ABNORMAL LOW (ref 13.0–17.0)
MCH: 30.6 pg (ref 26.0–34.0)
MCHC: 33.4 g/dL (ref 30.0–36.0)
MCV: 91.4 fL (ref 80.0–100.0)
Platelets: 223 10*3/uL (ref 150–400)
RBC: 4.09 MIL/uL — ABNORMAL LOW (ref 4.22–5.81)
RDW: 13.2 % (ref 11.5–15.5)
WBC: 10.5 10*3/uL (ref 4.0–10.5)
nRBC: 0 % (ref 0.0–0.2)

## 2020-05-01 LAB — GLUCOSE, CAPILLARY
Glucose-Capillary: 134 mg/dL — ABNORMAL HIGH (ref 70–99)
Glucose-Capillary: 124 mg/dL — ABNORMAL HIGH (ref 70–99)
Glucose-Capillary: 125 mg/dL — ABNORMAL HIGH (ref 70–99)
Glucose-Capillary: 124 mg/dL — ABNORMAL HIGH (ref 70–99)
Glucose-Capillary: 129 mg/dL — ABNORMAL HIGH (ref 70–99)

## 2020-05-01 LAB — HEPARIN LEVEL (UNFRACTIONATED)
Heparin Unfractionated: 0.3 IU/mL (ref 0.30–0.70)
Heparin Unfractionated: 0.39 IU/mL (ref 0.30–0.70)

## 2020-05-01 MED ORDER — WARFARIN SODIUM 5 MG PO TABS
5.0000 mg | ORAL_TABLET | Freq: Once | ORAL | Status: AC
  Administered 2020-05-01: 17:00:00 5 mg via ORAL
  Filled 2020-05-01: qty 1

## 2020-05-01 NOTE — Progress Notes (Signed)
  Speech Language Pathology Treatment: Dysphagia  Patient Details Name: John Lambert MRN: [id number] DOB: 01-14-1941 Today's Date: 05/01/2020 Time: 6283-6629 SLP Time Calculation (min) (ACUTE ONLY): 13 min  Assessment / Plan / Recommendation Clinical Impression  Pt is on speech caseload and new order placed due to family having questions related to dysphagia. Family was pleasant and therapist reviewed assessments and recommendations up to this point with them and pt. Brief therapeutic intervention using with effortful swallows, phonation, throat clear/coughs. Oral cavity moistened and pt able to swallow on command. Vocalized /ah/ but shook head and did not attempt other phonemes or attempts to state his name given sound. Plan is for Cortrak to be re placed tomorrow and repeat MBS when appropriate. RN stated has exhibited delayed coughing after nectar thick. Recommend giving crushed pills in puree only, no nectar thick.    HPI HPI: Pt is an 79 y.o. 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 He also has pain in left upper leg/hip region, and R foot drag; Imaging notable for Numerous scattered punctate acute infarctions in the left  posterior frontal region consistent with micro embolic infarctions  which could either be in the anterior or middle cerebral artery  territory. Punctate acute infarction also affecting the splenium of  the corpus callosum. Pt was seen by SLP on 12/17 for a BSE and SLE with no subsequent need for SLP services. Left carotid TCAR procedure with stent placement conducted on 12/22. Following the procedure he was found to be aphasic unable to speak with left gaze deviation and right hemiparesis. Emergent CTA showed patent left carotid stent with good flow in the terminal left ICA as well as middle cerebral artery in  the M1 segment. There appeared to be decreased flow in one of the M2 branches by neurology. SLP was consulted due to pt failing the [place], but a failed swallow screen has not been documented in EMR. EEG 12/22: No seizures      SLP Plan  Continue with current plan of care       Recommendations  Diet recommendations:  (NPO expecpt meds crushed in applesauce) Medication Administration: Crushed with puree                Oral Care Recommendations: Oral care QID Follow up Recommendations: Inpatient Rehab SLP Visit Diagnosis: Dysphagia, oropharyngeal phase (R13.12) Plan: Continue with current plan of care                       Houston Siren 05/01/2020, 2:05 PM Orbie Pyo Colvin Caroli.Ed Risk analyst [phone] Office [phone]

## 2020-05-01 NOTE — Progress Notes (Signed)
[place] for heparin infusion, warfarin Indication: on PTA warfarin for hx of DVT/PE, new acute stroke, new Afib  Allergies  Allergen Reactions  . Hctz [Hydrochlorothiazide] Other (See Comments)    dizzy  . Hydrocodone Itching  . Vicodin [Hydrocodone-Acetaminophen] Itching    Patient Measurements: Height: 6\' 2"  (188 cm) Weight: 87.1 kg (192 lb 0.3 oz) IBW/kg (Calculated) : 82.2  Heparin weight: 75.8 kg  Vital Signs: Temp: 97.7 F (36.5 C) (12/26 1154) Temp Source: Oral (12/26 1154) BP: 114/94 (12/26 1154) Pulse Rate: 85 (12/26 1154)  Labs: Recent Labs     0000 04/28/20 1600 04/29/20 0301 04/29/20 1852 04/30/20 0525 04/30/20 1400 05/01/20 0102 05/01/20 0931  HGB   < >  --  13.8  --  12.8*  --  12.5*  --   HCT  --   --  40.9  --  39.5  --  37.4*  --   PLT  --   --  192  --  200  --  223  --   LABPROT  --   --  15.5*  --  15.1  --  15.7*  --   INR  --   --  1.3*  --  1.2  --  1.3*  --   HEPARINUNFRC  --   --   --    < > 0.27* 0.28* 0.30 0.39  CREATININE  --  0.77 0.77  --  0.76  --   --   --    < > = values in this interval not displayed.    Estimated Creatinine Clearance: 84.2 mL/min (by C-G formula based on SCr of 0.76 mg/dL).  Assessment: 79 yo male with L symptomatic carotid stenosis.  S/p L carotid TCAR procedure w/ stenting that was complicated by postprocedure stroke.  MRI brain shows patchy L basal ganglia and L frontoparietal cortical and subcortical infarcts (new).  No hemorrhagic transformation.  Pharmacy consulted to switch treatment dose Lovenox to heparin drip given new stroke and new onset atrial fibrillation.   PTA warfarin dose: 2.5mg  daily except 1.25mg  on Mondays  05/01/20 afternoon: Today's INR subtherapeutic at 1.3.  12/24 warfarin dose was ordered correctly but not administered.  Yesterday's warfarin dose was 5 mg. Confirmatory heparin level remains therapeutic (0.39) with heparin infusing at 1250  units/hr. No significant issues with line or bleeding reported per RN.  Goal of Therapy:  Heparin level 0.3-0.5 units/ml (more narrow goal due to recent stroke) Monitor platelets by anticoagulation protocol: Yes  INR goal 2-3   Plan: Continue heparin infusion at 1250 units/hr  Warfarin 5 mg po x 1 today Monitor daily INR and heparin level, CBC, s/s bleeding   Amanee Iacovelli P. Legrand Como, PharmD, [place] Please utilize Amion for appropriate phone number to reach the unit pharmacist ([place]) 05/01/2020 1:18 PM

## 2020-05-01 NOTE — Progress Notes (Signed)
ANTICOAGULATION CONSULT NOTE - Follow Up Consult  Pharmacy Consult for heparin Indication: new Afib, acute CVA, and h/o VTE  Labs: Recent Labs    04/28/20 0500 04/28/20 1600 04/29/20 0301 04/29/20 1852 04/30/20 0525 04/30/20 1400 05/01/20 0102  HGB   < >  --  13.8  --  12.8*  --  12.5*  HCT  --   --  40.9  --  39.5  --  37.4*  PLT  --   --  192  --  200  --  223  LABPROT  --   --  15.5*  --  15.1  --  15.7*  INR  --   --  1.3*  --  1.2  --  1.3*  HEPARINUNFRC  --   --   --    < > 0.27* 0.28* 0.30  CREATININE  --  0.77 0.77  --  0.76  --   --    < > = values in this interval not displayed.    Assessment/Plan:  79yo male therapeutic on heparin after rate changes. Will continue gtt at current rate and confirm stable with additional level.   Wynona Neat, PharmD, BCPS  05/01/2020,3:24 AM

## 2020-05-01 NOTE — Progress Notes (Signed)
PROGRESS NOTE    John Lambert  [id number] DOB: [birth year] DOA: 04/19/2020 PCP: Harlan Stains, MD    Brief Narrative:79 y.o.malewith PMH significant for hypertension, melanoma, neurofibroma, clotting disorder, history of DVT and PE on chronic Coumadin who presented to the ED on 12/14 with c/o : bilateral lower extremity weakness and difficulty ambulating associated with pain in the left hip region.(has a history of neurofibroma tumor removal from his spine about 4 years ago and melanoma removal from right calf about 30 years ago.) MRI brain obtained noted numerous scattered punctate acute infarctions in the left posterior frontal region consistent with micro embolic infarctions which could either be in the anterior or middle cerebral artery territory. Punctate acute infarction also affecting the splenium ofthe corpus callosum. Extensive chronic small-vessel ischemic changes elsewherethroughout the brain. Small old left parietal vertex cortical and subcorticalInfarctions.  MRI lumbar spine showed chronic arachnoiditis with adhesions in the thecal sac and distorted cauda equina nerve roots from L2-L3 to the sacrum, but no evidence of recurrent spinal tumor.  Possible right L5/S1 radiculitis after noting L4/5 multifactorial mild to moderate spinal stenosis.  Vascular surgery was consulted and took patient on 12/22 for a left transcarotid artery stenting.  Patient found to have 80% carotid stenosis and then following procedure down to less than 10% with stent.  However, following surgery patient noted to have difficulty moving right side expressive aphasia and left gaze deviation and code stroke immediately called.  Patient taken straight to CT scan.  No new acute infarction noted.  Patient underwent emergent left carotid diagnostic cerebral catheter angiogram with left MCA embolism noted from previous, but no acute findings.  No intervention planned.  Patient sent to medical ICU in the absence of  beds in the neuro ICU.  EEG noted cortical dysfunction left hemisphere, but no seizures or epileptiform discharges seen throughout recording.  Patient transferred to stroke service on 12/22 Transferred out to [facility] service 04/29/2020    Assessment & Plan:   Principal Problem:   Weakness Active Problems:   Anticoagulated on warfarin   Impaired ambulation   Fall at home, initial encounter   Essential hypertension   Blood clotting disorder ([place])   Cerebral embolism with cerebral infarction   Stroke [facility])   Middle cerebral artery embolism, left   #1  Acute CVA on presentation from left carotid artery stenosis status post left carotid stenting with complications with possible new stroke.  He underwent elective left carotid transcranial procedure with stenting complicated by postop stroke. He had an emergent CT angiogram and cerebral catheter angiogram which did not reveal large vessel occlusion. EEG did not show any epileptiform activities.  MRI of the brain showed patchy left basal ganglia as well as left frontoparietal cortical and subcortical infarcts which are new in addition to the previous recent left MCA frontal infarct which was subacute. Seen by speech therapy recommending modified barium swallow. PT OT recommends CIR. Hemoglobin A1c is 5.3, LDL is 84, echo shows normal ejection fraction with no source of emboli. Carotid Doppler with left ICA 80 to 99%. He is currently on heparin and Coumadin till INR is therapeutic. Patient is on aspirin Plavix heparin and Coumadin.  Aspirin and Plavix to be continued for 30 days together with Coumadin And then to continue aspirin and Coumadin. Patient with ongoing dysphagia dysarthria/aphasia and dense right hemiplegia.  [place] TUBE PLACED 12/24 Patient pulled out for core track tube overnight. Patient scheduled to have core track tube placed by interventional radiology again.  #  2 left carotid artery stenosis vascular surgery was  consulted he is status post carotid artery stenting.  #3  History of clotting disorder including PE DVT was on Coumadin prior to admission.  Doppler of the lower extremity shows chronic DVT.  Continue heparin and Coumadin.  #4 hyperlipidemia LDL 84 continue Pravachol 40 mg daily.  Continue the same dose upon discharge.  #5 paroxysmal A. fib CHA2DS2-VASc score above 5 continue Coumadin on discharge.  INR still 1.3 on heparin and Coumadin.  Pharmacy adjusting dose of both.  #6 history of essential hypertension on Coreg twice a day.  Off of Cleviprex drip.  #7 nutrition patient pulled out a core track tube 1224 night.  Patient had MBS done on 04/29/2020 with findings consistent with silent aspiration. Contract to be placed today again.    Nutrition Problem: Inadequate oral intake Etiology: dysphagia     Signs/Symptoms: NPO status    Interventions: Tube feeding  Estimated body mass index is 24.65 kg/m as calculated from the following:   Height as of this encounter: 6\' 2"  (1.88 m).   Weight as of this encounter: 87.1 kg.  DVT prophylaxis: Heparin and Coumadin  code Status: Full code Family Communication: None at bedside  disposition Plan:  Status is: Inpatient   Dispo: The patient is from: Home              Anticipated d/c is to: Home              Anticipated d/c date is: > 3 days              Patient currently is not medically stable to d/c.   Consultants: Vascular surgery, neurology    Procedures: None Antimicrobials: None  Subjective:  Patient resting in bed wife by the bedside stayed with him overnight did not have anything to eat or drink On IV fluids 75 cc an hour  Objective: Vitals:   04/30/20 2340 05/01/20 0404 05/01/20 0453 05/01/20 0920  BP: 117/79 123/76  126/75  Pulse: 87 83  95  Resp: 17 17  20   Temp: 98.5 F (36.9 C) (!) 97.5 F (36.4 C)  97.9 F (36.6 C)  TempSrc: Oral Oral  Oral  SpO2: 95% 94%  95%  Weight:   87.1 kg   Height:         Intake/Output Summary (Last 24 hours) at 05/01/2020 1108 Last data filed at 05/01/2020 1100 Gross per 24 hour  Intake 2181.93 ml  Output 500 ml  Net 1681.93 ml   Filed Weights   04/20/20 2034 04/30/20 0500 05/01/20 0453  Weight: 79.7 kg 87.2 kg 87.1 kg    Examination:  General exam: Appears calm and comfortable  Respiratory system: Clear to auscultation. Respiratory effort normal. Cardiovascular system: S1 & S2 heard, RRR. No JVD, murmurs, rubs, gallops or clicks. No pedal edema. Gastrointestinal system: Abdomen is nondistended, soft and nontender. No organomegaly or masses felt. Normal bowel sounds heard. Central nervous system: Awake aphasic right upper and lower extremity hemiplegia noted  extremities: Symmetric 5 x 5 power. Skin: No rashes, lesions or ulcers Psychiatry: Unable to assess   Data Reviewed: I have personally reviewed following labs and imaging studies  CBC: Recent Labs  Lab 04/26/20 2108 04/27/20 0253 04/29/20 0301 04/30/20 0525 05/01/20 0102  WBC 6.9 6.8 11.0* 11.3* 10.5  HGB 16.2 15.9 13.8 12.8* 12.5*  HCT 49.2 46.9 40.9 39.5 37.4*  MCV 89.6 91.1 90.5 91.4 91.4  PLT 224 226 192 200 223  Basic Metabolic Panel: Recent Labs  Lab 04/26/20 0210 04/26/20 2108 04/27/20 0253 04/28/20 1600 04/29/20 0301 04/30/20 0525  NA 138 137 141 139 137 137  K 3.8 4.1 4.0 3.9 4.0 3.7  CL 105 104 105 107 106 108  CO2 22 24 26 22 22 23   GLUCOSE 96 113* 98 100* 121* 141*  BUN 16 15 13 16 17 16   CREATININE 0.77 0.87 0.91 0.77 0.77 0.76  CALCIUM 8.3* 8.6* 8.7* 8.2* 8.3* 8.0*  MG 1.9  --  2.1 1.9  --   --   PHOS 3.6  --  3.1  --   --   --    GFR: Estimated Creatinine Clearance: 84.2 mL/min (by C-G formula based on SCr of 0.76 mg/dL). Liver Function Tests: Recent Labs  Lab 04/26/20 2108 04/30/20 0525  AST 30 27  ALT 28 29  ALKPHOS 52 39  BILITOT 0.8 1.3*  PROT 6.3* 5.4*  ALBUMIN 3.5 2.8*   No results for input(s): LIPASE, AMYLASE in the last 168  hours. No results for input(s): AMMONIA in the last 168 hours. Coagulation Profile: Recent Labs  Lab 04/27/20 0253 04/28/20 0500 04/29/20 0301 04/30/20 0525 05/01/20 0102  INR 1.3* 1.2 1.3* 1.2 1.3*   Cardiac Enzymes: No results for input(s): CKTOTAL, CKMB, CKMBINDEX, TROPONINI in the last 168 hours. BNP (last 3 results) No results for input(s): PROBNP in the last 8760 hours. HbA1C: No results for input(s): HGBA1C in the last 72 hours. CBG: Recent Labs  Lab 04/30/20 1547 04/30/20 2012 04/30/20 2337 05/01/20 0401 05/01/20 0730  GLUCAP 134* 148* 138* 134* 124*   Lipid Profile: No results for input(s): CHOL, HDL, LDLCALC, TRIG, CHOLHDL, LDLDIRECT in the last 72 hours. Thyroid Function Tests: No results for input(s): TSH, T4TOTAL, FREET4, T3FREE, THYROIDAB in the last 72 hours. Anemia Panel: No results for input(s): VITAMINB12, FOLATE, FERRITIN, TIBC, IRON, RETICCTPCT in the last 72 hours. Sepsis Labs: No results for input(s): PROCALCITON, LATICACIDVEN in the last 168 hours.  Recent Results (from the past 240 hour(s))  MRSA PCR Screening     Status: None   Collection Time: 04/27/20  3:39 PM   Specimen: Nasopharyngeal  Result Value Ref Range Status   MRSA by PCR NEGATIVE NEGATIVE Final    Comment:        The GeneXpert MRSA Assay (FDA approved for NASAL specimens only), is one component of a comprehensive MRSA colonization surveillance program. It is not intended to diagnose MRSA infection nor to guide or monitor treatment for MRSA infections. Performed at [facility], [place] [address], [place], [place] [address]          Radiology Studies: No results found.      Scheduled Meds: . aspirin  81 mg Oral Daily  . carvedilol  25 mg Oral BID WC  . Chlorhexidine Gluconate Cloth  6 each Topical Daily  . clopidogrel  75 mg Oral Daily  . feeding supplement (PROSource TF)  45 mL Per Tube Daily  . pravastatin  40 mg Oral Daily  . Warfarin - Pharmacist  Dosing Inpatient   Does not apply q1600   Continuous Infusions: . dextrose 5 % and 0.9% NaCl 70 mL/hr at 05/01/20 0458  . feeding supplement (JEVITY 1.2 CAL) 1,000 mL (04/29/20 1402)  . heparin 1,250 Units/hr (05/01/20 0322)     LOS: 10 days     Georgette Shell, MD 05/01/2020, 11:08 AM

## 2020-05-01 NOTE — Progress Notes (Signed)
   Patient evaluated with wife at bedside.  He is able to nod answers to questions and move his left side with no motor function of his right upper or lower extremity he appears to remain aphasic.  Will likely require CIR.  Debie Ashline C. Donzetta Matters, MD [facility] Office: [phone] Pager: [phone]

## 2020-05-01 NOTE — Progress Notes (Signed)
STROKE TEAM PROGRESS NOTE   INTERVAL HISTORY Patient is sitting up in bed.  His wife and daughter are at the bedside.  Continues to have severe expressive aphasia in right hemiparesis.  Is able to look to the right past midline none.  Vital signs are stable.  Neurological exam is unchanged.  He has slight swelling and tenderness over the TCAR surgical wound site today..  Remains on IV heparin level this morning is 0. 3 9.  INR is yet again 1.3.  Vitals:   05/01/20 0404 05/01/20 0453 05/01/20 0920 05/01/20 1154  BP: 123/76  126/75 (!) 114/94  Pulse: 83  95 85  Resp: 17  20 18   Temp: (!) 97.5 F (36.4 C)  97.9 F (36.6 C) 97.7 F (36.5 C)  TempSrc: Oral  Oral Oral  SpO2: 94%  95% 95%  Weight:  87.1 kg    Height:       CBC:  Recent Labs  Lab 04/30/20 0525 05/01/20 0102  WBC 11.3* 10.5  HGB 12.8* 12.5*  HCT 39.5 37.4*  MCV 91.4 91.4  PLT 200 163   Basic Metabolic Panel:  Recent Labs  Lab 04/26/20 0210 04/26/20 2108 04/27/20 0253 04/28/20 1600 04/29/20 0301 04/30/20 0525  NA 138   < > 141 139 137 137  K 3.8   < > 4.0 3.9 4.0 3.7  CL 105   < > 105 107 106 108  CO2 22   < > 26 22 22 23   GLUCOSE 96   < > 98 100* 121* 141*  BUN 16   < > 13 16 17 16   CREATININE 0.77   < > 0.91 0.77 0.77 0.76  CALCIUM 8.3*   < > 8.7* 8.2* 8.3* 8.0*  MG 1.9  --  2.1 1.9  --   --   PHOS 3.6  --  3.1  --   --   --    < > = values in this interval not displayed.   Lipid Panel:  Recent Labs  Lab 04/28/20 0135  CHOL 104  TRIG 66  HDL 30*  CHOLHDL 3.5  VLDL 13  LDLCALC 61   HgbA1c:  Recent Labs  Lab 04/28/20 0135  HGBA1C 5.1   Urine Drug Screen: No results for input(s): LABOPIA, COCAINSCRNUR, LABBENZ, AMPHETMU, THCU, LABBARB in the last 168 hours.  Alcohol Level No results for input(s): ETH in the last 168 hours.  IMAGING past 24 hours No results found.  PHYSICAL EXAM    Pleasant elderly Caucasian male sitting up in bed. Not in distress. Surgical wound from TCAR procedure  appears to be healing well without any bleeding from the left neck. . Afebrile. Head is nontraumatic. Neck is supple without bruit.    Cardiac exam no murmur or gallop. Lungs are clear to auscultation. Distal pulses are well felt. Neurological Exam :  He is awake alert severe expressive aphasia with anarthria but good comprehension and follows 1 and occasionally two-step commands. Left gaze preference but is able to look to the right past midline. Blinks to threat on the left but not on the right. Right lower facial weakness. Tongue midline. Right hemi-plegia with right upper extremity 0/5 strength except has some elbow extension on the right right lower extremity 2/5 strength with increased tone. Good strength on the left. Gait not tested  ASSESSMENT/PLAN John Lambert is a 79 y.o. male with history of L hemispheric infarcts secondary to symptomatic 80% left proximal carotid stenosis with a  soft plaque on long-term AC for blood clotting disorder who underwent L carotid TCAR 04/27/2020. In PACU Post op developed aphasia w/ L gaze and R hemiparesis. Taken for emergent cerebral angio after CT, CTA neg for occlusion.    Stroke:scattered LACA andMCA / ACA territoryinfarcts most likely thrombembolicd/t severe L ICA stenosis followed by elective left carotid TCAR procedure and stenting procedure complicated by new embolic left MCA branch infarcts  MRI admission  Numerous scattered punctate L posterior frontal infarct w/ microembolic infarcts ACA/MCA territory and Punctate infarct corpus callosum. Old L parietal cortical and subcortical infarcts.  CTA head & neck ulcerated L ICA bifurcation w/ severe proximal 80% stenosis. Moderate intradural L VA stenosis. RUL w/ ground glass appearance.   MRI 12/23 (post TCAR) new scattered L basal ganglia, L frontoparietal cortical and subcortical infarcts. Subacute L medial frontoparietal infarct. small vessel disease.    Carotid Doppler  L ICA 80-99%  2D  Echo w/ bubble EF 60-65%. No source of embolus   LDL 84  HgbA1c 5.3  VTE prophylaxis - warfarin / Heparin IV  warfarin daily prior to admission, now on  heparin gtt and bridge to warfarin. Continue aspirin, plavix with warfarin x 1 mo.  Therapy recommendations:  CIR->reassessment pending   Disposition:  pending   follow up with stroke clinic NP at [place] 4 weeks following rehab discharge  Symptomatic L ICA Stenosis   Carotid Doppler  L ICA 80-99%  VVS consulted John Lambert)  TCAR 04/27/2020  Post op neuro worsening  CT, CTA, Cerebral angio all neg for occlusion. MRI shows new L brain scattered small infarcts  Per Dr. Trula Lambert, ASA, plavix and anticoagulation triple therapy for one month.   Blood Clotting D/O Hx DVT, PE  Home anticoagulation:  warfarin daily, continued in the hospital  Admission INR 2.2  LE doppler partial non-obstructed age indeterminate DVT R common femoral and R popliteal vein, likely chronic   Post TCAR, on lovenox bridge->change to IV heparin d/t new strokes and new AF  INR 1.3  Atrial Fibrillation, new dx  Home anticoagulation:  warfarin daily for clotting d/o, hx PE, DVT  INR 1.3   CHA2DS2-VASc Score = 5, ?2 oral anticoagulation recommended  Age in Years:  ?74   +2    Sex:  Male   0    Hypertension History:   +1     Diabetes Mellitus:  0  Congestive Heart Failure History:  0  Vascular Disease History:  0     Stroke/TIA/Thromboembolism History:  yes   +2 .  heparin gtt and bridge to warfarin. Continue aspirin, plavix with warfarin x 1 mo.   Hypertension  Off Cleviprex gtt   On coreg bid  BP at goal now <160  Long term goal normotensive  Hyperlipidemia  Home meds:  pravachol 20  Now on pravachol 40  LDL 84, goal < 70  Continue statin at discharge  Dysphagia . Secondary to stroke . Cleared for D1 and meds in puree . For MBSS 12/24 . May need cortrak if performs poorly. Discussed w/ wife and daughter . Speech on board    Other Stroke Risk Factors  Advanced Age >/= 62   Former Cigarette smoker  ETOH use, advised to drink no more than 2 drink(s) a day  Other Active Problems, Findings and Recommendations  Aphasia and right hemiplegia lab  Pt removed Cortrak 12/24 PM. (just placed 12/24 at noon) -> -> now on Heart room service diet with thin liquids.  Awaiting Inpt  Rehab bed - hopefully next week.  Mild leukocytosis - WBC's 11.3 -> 10.5  T Max 99.4 -> 97.9  New transient paroxysmal A. fib this admission.   Hospital day # 10 Continue IV heparin drip till INR is optimal between 2-3.  Continue aspirin and Plavix for 30 days for his fresh carotid stent then aspirin alone.  Speech therapy to continue following his swallowing.  Hopefully transfer to inpatient rehab next week when bed available.  We will ask vascular surgery to look at his TCAR wound.  Long discussion with patient and wife, daughter and answered questions.  Greater than 50% time during this 25-minute visit spent in counseling and coordination of care about his stroke and carotid revascularization procedure complications and answering questions. John Contras, MD    To contact Stroke Continuity provider, please refer to [email or web address] After hours, contact General Neurology

## 2020-05-02 DIAGNOSIS — I6602 Occlusion and stenosis of left middle cerebral artery: Secondary | ICD-10-CM

## 2020-05-02 LAB — CBC
HCT: 34.7 % — ABNORMAL LOW (ref 39.0–52.0)
Hemoglobin: 11.5 g/dL — ABNORMAL LOW (ref 13.0–17.0)
MCH: 30.5 pg (ref 26.0–34.0)
MCHC: 33.1 g/dL (ref 30.0–36.0)
MCV: 92 fL (ref 80.0–100.0)
Platelets: 240 10*3/uL (ref 150–400)
RBC: 3.77 MIL/uL — ABNORMAL LOW (ref 4.22–5.81)
RDW: 13.3 % (ref 11.5–15.5)
WBC: 8.4 10*3/uL (ref 4.0–10.5)
nRBC: 0 % (ref 0.0–0.2)

## 2020-05-02 LAB — COMPREHENSIVE METABOLIC PANEL
ALT: 29 U/L (ref 0–44)
AST: 27 U/L (ref 15–41)
Albumin: 2.5 g/dL — ABNORMAL LOW (ref 3.5–5.0)
Alkaline Phosphatase: 34 U/L — ABNORMAL LOW (ref 38–126)
Anion gap: 8 (ref 5–15)
BUN: 29 mg/dL — ABNORMAL HIGH (ref 8–23)
CO2: 22 mmol/L (ref 22–32)
Calcium: 8.2 mg/dL — ABNORMAL LOW (ref 8.9–10.3)
Chloride: 115 mmol/L — ABNORMAL HIGH (ref 98–111)
Creatinine, Ser: 1.3 mg/dL — ABNORMAL HIGH (ref 0.61–1.24)
GFR, Estimated: 55 mL/min — ABNORMAL LOW (ref >60)
Glucose, Bld: 132 mg/dL — ABNORMAL HIGH (ref 70–99)
Potassium: 3.4 mmol/L — ABNORMAL LOW (ref 3.5–5.1)
Sodium: 145 mmol/L (ref 135–145)
Total Bilirubin: 1.1 mg/dL (ref 0.3–1.2)
Total Protein: 4.9 g/dL — ABNORMAL LOW (ref 6.5–8.1)

## 2020-05-02 LAB — GLUCOSE, CAPILLARY
Glucose-Capillary: 114 mg/dL — ABNORMAL HIGH (ref 70–99)
Glucose-Capillary: 117 mg/dL — ABNORMAL HIGH (ref 70–99)
Glucose-Capillary: 123 mg/dL — ABNORMAL HIGH (ref 70–99)
Glucose-Capillary: 129 mg/dL — ABNORMAL HIGH (ref 70–99)
Glucose-Capillary: 134 mg/dL — ABNORMAL HIGH (ref 70–99)

## 2020-05-02 LAB — HEPARIN LEVEL (UNFRACTIONATED): Heparin Unfractionated: 0.34 IU/mL (ref 0.30–0.70)

## 2020-05-02 LAB — PROTIME-INR
INR: 1.9 — ABNORMAL HIGH (ref 0.8–1.2)
Prothrombin Time: 21.1 seconds — ABNORMAL HIGH (ref 11.4–15.2)

## 2020-05-02 LAB — MAGNESIUM: Magnesium: 2.1 mg/dL (ref 1.7–2.4)

## 2020-05-02 MED ORDER — SODIUM CHLORIDE 0.45 % IV SOLN: INTRAVENOUS | Status: DC

## 2020-05-02 MED ORDER — POTASSIUM CHLORIDE 10 MEQ/100ML IV SOLN
10.0000 meq | INTRAVENOUS | Status: AC
Start: 2020-05-02 — End: 2020-05-03
  Administered 2020-05-02 (×3): 10 meq via INTRAVENOUS
  Filled 2020-05-02 (×3): qty 100

## 2020-05-02 MED ORDER — WARFARIN SODIUM 2.5 MG PO TABS
2.5000 mg | ORAL_TABLET | Freq: Once | ORAL | Status: AC
  Administered 2020-05-02: 18:00:00 2.5 mg via ORAL
  Filled 2020-05-02: qty 1

## 2020-05-02 NOTE — Progress Notes (Signed)
Inpatient Rehabilitation-Admissions Coordinator   Received insurance approval for admit to CIR. However, that approval was based on clinicals and his level of function prior to surgery.Unfortunately, since surgery, pt has had a change in function and according to PT today, now requires total A for all bed mobility and tranfers and presents with lethargy; therapy rec's have changed from CIR to SNF.   Met with pt, his wife, and daughter bedside. We discussed that if pt were to be admitted to CIR he would now need 24/7 A at DC due to anticipated goals being Min/Mod A. Family is unable to provide that amount of assist at this time. Discussed that it is unlikely that insurance would approve SNF stay after CIR. Thus, SNF is being recommended due to lack of caregiver support and the need for a longer rehab stay. Pt's family aware of new recommendation and verbalize understanding.   TOC team has been updated and will speak with the family about other options.   AC will sign off.   Raechel Ache, OTR/L  Rehab Admissions Coordinator  [phone] 05/02/2020 2:32 PM

## 2020-05-02 NOTE — Progress Notes (Addendum)
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 Punctate acute infarction also affecting the splenium ofthe corpus callosum. Extensive chronic small-vessel ischemic changes elsewherethroughout the brain. Small old left parietal vertex cortical and subcorticalInfarctions.  MRI lumbar spine showed chronic arachnoiditis with adhesions in the thecal sac and distorted cauda equina nerve roots from L2-L3 to the sacrum, but no evidence of recurrent spinal tumor.  Possible right L5/S1 radiculitis after noting L4/5 multifactorial mild to moderate spinal stenosis.  Vascular surgery was consulted and took patient on 12/22 for a left transcarotid artery stenting.  Patient found to have 80% carotid stenosis and then following procedure down to less than 10% with stent.  However, following surgery patient noted to have difficulty moving right side expressive aphasia and left gaze deviation and code stroke immediately called.  Patient taken straight to CT scan.  No new acute infarction noted.  Patient underwent emergent left carotid diagnostic cerebral catheter angiogram with left MCA embolism noted from previous, but no acute findings.  No intervention planned.  Patient sent to medical ICU in the absence of  beds in the neuro ICU.  EEG noted cortical dysfunction left hemisphere, but no seizures or epileptiform discharges seen throughout recording.  Patient transferred to stroke service on 12/22 Transferred out to [facility] service 04/29/2020    Assessment & Plan:   Principal Problem:   Weakness Active Problems:   Anticoagulated on warfarin   Impaired ambulation   Fall at home, initial encounter   Essential hypertension   Blood clotting disorder (HCC)   Cerebral embolism with cerebral infarction   Stroke [facility])   Middle cerebral artery embolism, left   #1  Acute CVA on presentation from left carotid artery stenosis status post left carotid stenting with complications with  new stroke.  He underwent elective left carotid transcranial procedure with stenting complicated by postop stroke affecting his speech he is completely aphasic and now with dense right hemiplegia.  He had an emergent CT angiogram and cerebral catheter angiogram which did not reveal large vessel occlusion.  EEG did not show any epileptiform activities.   MRI of the brain showed patchy left basal ganglia as well as left frontoparietal cortical and subcortical infarcts which are new in addition to the previous recent left MCA frontal infarct which was subacute. Seen by speech therapy modified barium swallow consistent with silent aspiration. Core track tube was placed which he pulled it out. Core track tube reinserted 05/02/2020 by dietary.  Tube feeds restarted 05/02/2020. PT OT recommends CIR.  Hemoglobin A1c is 5.3, LDL is 84,  echo shows normal ejection fraction with no source of emboli. Carotid Doppler with left ICA 80 to 99%. He is currently on heparin and Coumadin till INR is therapeutic. Patient is on aspirin Plavix heparin and Coumadin.  Neurology recommends to continue aspirin and Plavix for 30 days for  fresh carotid stent then aspirin alone.  However will need to continue Coumadin for A. fib.    #2 left  carotid artery stenosis vascular surgery status post carotid artery stenting vascular following.  #3  History of clotting disorder including PE DVT was on Coumadin prior to admission.  Doppler of the lower extremity shows chronic DVT.  Continue heparin and Coumadin.  INR still subtherapeutic.  Pharmacy following INR.  #4 hyperlipidemia LDL 84 continue Pravachol 40 mg daily.  Continue the same dose upon discharge.  #5 paroxysmal A. fib CHA2DS2-VASc score above 5 continue Coumadin on discharge.  INR slowly trending up to 1.9.  Pharmacy adjusting the dose.   #6 history of essential hypertension on Coreg twice a day.  Off of Cleviprex drip.  #7 nutrition patient pulled out a core track tube 1224 night.  Patient had MBS done on 04/29/2020 with findings consistent with silent aspiration. Core track tube was placed again on 05/02/2020.  #8 AKI his creatinine trended up to 1.3 from 0.762 days ago.  This is likely prerenal.  I will start him on half-normal saline 100 cc an hour for 2 days recheck labs in a.m.    #9 mild hypokalemia potassium 3.4 replete check magnesium level.  Nutrition Problem: Inadequate oral intake Etiology: dysphagia     Signs/Symptoms: NPO status    Interventions: Tube feeding  Estimated body mass index is 24.65 kg/m as calculated from the following:   Height as of this encounter: 6\' 2"  (1.88 m).   Weight as of this encounter: 87.1 kg.  DVT prophylaxis: Heparin and Coumadin  code Status: Full code Family Communication: None at bedside  disposition Plan:  Status is: Inpatient   Dispo: The patient is from: Home              Anticipated d/c is to: CIR              Anticipated d/c date is: > 3 days              Patient currently is not medically stable to d/c.   Consultants: Vascular surgery, neurology    Procedures: None Antimicrobials: None  Subjective:  Patient resting in bed, daughter by the bedside she stayed with him overnight. Overnight events  noted.  Patient with dark urine Concern for hematuria Objective: Vitals:   05/01/20 1952 05/02/20 0001 05/02/20 0336 05/02/20 0908  BP: 121/77 125/75 133/87 130/88  Pulse: 95 87 97 99  Resp: 17 18 20 18   Temp: 98 F (36.7 C) 99.7 F (37.6 C) 98 F (36.7 C) 98 F (36.7 C)  TempSrc: Oral Axillary Axillary Oral  SpO2: 94% 95% 100% 94%  Weight:      Height:        Intake/Output Summary (Last 24 hours) at 05/02/2020 1151 Last data filed at 05/02/2020 05/04/2020 Gross per 24 hour  Intake 911.53 ml  Output 560 ml  Net 351.53 ml   Filed Weights   04/20/20 2034 04/30/20 0500 05/01/20 0453  Weight: 79.7 kg 87.2 kg 87.1 kg    Examination: He is awake nods his head yes and no appropriately squeezed my hands with his left hand  General exam: Appears chronically ill Respiratory system: Clear to auscultation. Respiratory effort normal. Cardiovascular system: S1 & S2 heard, RRR. No JVD, murmurs, rubs, gallops or clicks. No pedal edema. Gastrointestinal system: Abdomen is nondistended, soft and nontender. No organomegaly or masses felt. Normal bowel sounds heard. Central nervous system: Awake aphasic right upper and  lower extremity hemiplegia noted 5 x 5 left upper extremity and left lower extremity extremities: No edema Skin: No rashes, lesions or ulcers Psychiatry: Unable to assess   Data Reviewed: I have personally reviewed following labs and imaging studies  CBC: Recent Labs  Lab 04/27/20 0253 04/29/20 0301 04/30/20 0525 05/01/20 0102 05/02/20 0242  WBC 6.8 11.0* 11.3* 10.5 8.4  HGB 15.9 13.8 12.8* 12.5* 11.5*  HCT 46.9 40.9 39.5 37.4* 34.7*  MCV 91.1 90.5 91.4 91.4 92.0  PLT 226 192 200 223 [id number]   Basic Metabolic Panel: Recent Labs  Lab 04/26/20 0210 04/26/20 2108 04/27/20 0253 04/28/20 1600 04/29/20 0301 04/30/20 0525 05/02/20 0741  NA 138   < > 141 139 137 137 145  K 3.8   < > 4.0 3.9 4.0 3.7 3.4*  CL 105   < > 105 107 106 108 115*  CO2 22   < > 26 22 22 23 22    GLUCOSE 96   < > 98 100* 121* 141* 132*  BUN 16   < > 13 16 17 16  29*  CREATININE 0.77   < > 0.91 0.77 0.77 0.76 1.30*  CALCIUM 8.3*   < > 8.7* 8.2* 8.3* 8.0* 8.2*  MG 1.9  --  2.1 1.9  --   --   --   PHOS 3.6  --  3.1  --   --   --   --    < > = values in this interval not displayed.   GFR: Estimated Creatinine Clearance: 51.8 mL/min (A) (by C-G formula based on SCr of 1.3 mg/dL (H)). Liver Function Tests: Recent Labs  Lab 04/26/20 2108 04/30/20 0525 05/02/20 0741  AST 30 27 27   ALT 28 29 29   ALKPHOS 52 39 34*  BILITOT 0.8 1.3* 1.1  PROT 6.3* 5.4* 4.9*  ALBUMIN 3.5 2.8* 2.5*   No results for input(s): LIPASE, AMYLASE in the last 168 hours. No results for input(s): AMMONIA in the last 168 hours. Coagulation Profile: Recent Labs  Lab 04/28/20 0500 04/29/20 0301 04/30/20 0525 05/01/20 0102 05/02/20 0242  INR 1.2 1.3* 1.2 1.3* 1.9*   Cardiac Enzymes: No results for input(s): CKTOTAL, CKMB, CKMBINDEX, TROPONINI in the last 168 hours. BNP (last 3 results) No results for input(s): PROBNP in the last 8760 hours. HbA1C: No results for input(s): HGBA1C in the last 72 hours. CBG: Recent Labs  Lab 05/01/20 1157 05/01/20 1634 05/01/20 1949 05/02/20 0026 05/02/20 0339  GLUCAP 125* 124* 129* 129* 114*   Lipid Profile: No results for input(s): CHOL, HDL, LDLCALC, TRIG, CHOLHDL, LDLDIRECT in the last 72 hours. Thyroid Function Tests: No results for input(s): TSH, T4TOTAL, FREET4, T3FREE, THYROIDAB in the last 72 hours. Anemia Panel: No results for input(s): VITAMINB12, FOLATE, FERRITIN, TIBC, IRON, RETICCTPCT in the last 72 hours. Sepsis Labs: No results for input(s): PROCALCITON, LATICACIDVEN in the last 168 hours.  Recent Results (from the past 240 hour(s))  MRSA PCR Screening     Status: None   Collection Time: 04/27/20  3:39 PM   Specimen: Nasopharyngeal  Result Value Ref Range Status   MRSA by PCR NEGATIVE NEGATIVE Final    Comment:        The GeneXpert MRSA  Assay (FDA approved for NASAL specimens only), is one component of a comprehensive MRSA colonization surveillance program. It is not intended to diagnose MRSA infection nor to guide or monitor treatment for MRSA infections. Performed at [facility], [place],  [place] [address]          Radiology Studies: No results found.      Scheduled Meds: . aspirin  81 mg Oral Daily  . carvedilol  25 mg Oral BID WC  . Chlorhexidine Gluconate Cloth  6 each Topical Daily  . clopidogrel  75 mg Oral Daily  . feeding supplement (PROSource TF)  45 mL Per Tube Daily  . pravastatin  40 mg Oral Daily  . warfarin  2.5 mg Oral ONCE-1600  . Warfarin - Pharmacist Dosing Inpatient   Does not apply q1600   Continuous Infusions: . dextrose 5 % and 0.9% NaCl 70 mL/hr at 05/02/20 0708  . feeding supplement (JEVITY 1.2 CAL) 1,000 mL (05/02/20 1108)  . heparin 1,250 Units/hr (05/01/20 1819)     LOS: 52 days     Georgette Shell, MD 05/02/2020, 11:51 AM

## 2020-05-02 NOTE — Progress Notes (Signed)
Notified MD on call of the H/H result with a drop in hemoglobin level. No new orders given.

## 2020-05-02 NOTE — TOC Progression Note (Signed)
Transition of Care [facility]) - Progression Note    Patient Details  Name: John Lambert MRN: [id number] Date of Birth: February 02, 1941  Transition of Care [facility]) CM/SW Payne, LCSW Phone Number: 05/02/2020, 4:01 PM  Clinical Narrative:    CSW received notification from CIR that patient is no longer a candidate for their program and will require SNF. CSW met with patient's spouse and daughter at bedside to explain SNF process and provided SNF list. They request a facility either in [facility] or [place]. Patient is not vaccinated against COVID. CSW will send out referral once Cortrak is removed.    Expected Discharge Plan: [place] Barriers to Discharge: [place] Work up,SNF Pending bed offer  Expected Discharge Plan and Services Expected Discharge Plan: [place] In-house Referral: Clinical Social Work Discharge Planning Services: CM Consult Post Acute Care Choice: [place] arrangements for the past 2 months: Single Family Home                                       Social Determinants of Health (SDOH) Interventions    Readmission Risk Interventions No flowsheet data found.

## 2020-05-02 NOTE — Progress Notes (Signed)
Noted urine dark brown at shift change and continued to monitor pt closely at midnight urine color much dark -red  and bruises to scrotum . Md on call made aware and new order for H/H ordered . No active bleeding observed, however RN will continue to monitor urine output and color and report any bleeding as pt on heparin drip.  Pt remain drowsy but responds to question by knoding head  Or with minimal word mouthing. Noted severe expressive asphasia- mute.

## 2020-05-02 NOTE — Progress Notes (Signed)
Occupational Therapy Treatment Patient Details Name: John Lambert MRN: [id number] DOB: [birth year] Today's Date: 05/02/2020    History of present illness John Lambert is a 79 y.o. male admitted 12/14 with bil LE weakness and difficulty walking as well as left hip pain. Imaging notable for Numerous scattered punctate acute infarctions in the left  posterior frontal region consistent with micro embolic infarctions. Punctate acute infarction also affecting the splenium of  the corpus callosum.  Pt with L carotid artery stenosis s/p stent on 04/27/20. Post procedure pt with right hemiparesis, aphasia and left gaze with intraprocedural stroke. 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   OT comments  Pt seen in conjunction with PT to maximize pts activity tolerance. Pt with functional decline in comparison to previous OT sessions ( alerted OTR) with pt needing total A +2 for all aspects of bed mobility, pt able to sit<>stand to stedy however unable to come into full stand despite total A +2. Pt present with kyphotic sitting posture needing MOD- MOD A for static sitting EOB with LUE supported as pts RUE completely flaccid. Pt currently requires total A for all ADLS. Pt flat throuhgut session making no efforts to verbalize, but does nod appropriately during session. D/t change in functional decline collaborated with OTR to update DC recs to SNF. Per chart review, noted that CIR also signed off on pt. Will continue to follow acutely per POC.    Follow Up Recommendations  Supervision/Assistance - 24 hour;SNF    Equipment Recommendations  3 in 1 bedside commode    Recommendations for Other Services      Precautions / Restrictions Precautions Precautions: Fall Precaution Comments: cortrak, R hemi Restrictions Weight Bearing Restrictions: No       Mobility Bed Mobility Overal bed mobility: Needs Assistance Bed Mobility:  Rolling;Supine to Sit;Sit to Supine Rolling: Total assist;+2 for physical assistance   Supine to sit: Total assist;+2 for physical assistance Sit to supine: Total assist;+2 for physical assistance   General bed mobility comments: total +2 for rolling for pericare, trunk and LE management scooting to and from EOB when performing supine<>sit, and boost up in bed with boost function and bed pads upon return to supine.  Transfers Overall transfer level: Needs assistance Equipment used: 2 person hand held assist;Ambulation equipment used Transfers: Sit to/from Stand Sit to Stand: Total assist;+2 physical assistance;+2 safety/equipment;From elevated surface         General transfer comment: Total +2 for all aspects, especially RUE/RLE management, initial power up, and rise. Pt stood in stedy upright position x1 minute, fatiguing demonstrated in eye closing and forward flexing posture.    Balance Overall balance assessment: Needs assistance Sitting-balance support: Single extremity supported;Feet supported Sitting balance-Leahy Scale: Poor Sitting balance - Comments: mod-max posterior assist to maintain upright sitting, aided by use of LUE HHA Postural control: Right lateral lean Standing balance support: Bilateral upper extremity supported Standing balance-Leahy Scale: Zero Standing balance comment: total +2 to reach standing                           ADL either performed or assessed with clinical judgement   ADL Overall ADL's : Needs assistance/impaired                 Upper Body Dressing : Total assistance;Bed level Upper Body Dressing Details (indicate cue type and reason): to change gown  Toilet Transfer: Total assistance;+2 for physical assistance Toilet Transfer Details (indicate cue type and reason): sit<>stand only Toileting- Clothing Manipulation and Hygiene: Total assistance;Bed level Toileting - Clothing Manipulation Details (indicate cue type and  reason): total A for posterior pericare from bed level     Functional mobility during ADLs: Total assistance;+2 for physical assistance (bed mobility and sit<>stand to stedy only) General ADL Comments: pt with decline in function needing total A +2 for all aspects of mobility, pt presents with expressive aphasia, decresed activity tolerance and R hemi     Vision   Vision Assessment?: Vision impaired- to be further tested in functional context Additional Comments: difficult to assess secondary to cog   Perception     Praxis      Cognition Arousal/Alertness: Awake/alert Behavior During Therapy: Flat affect Overall Cognitive Status: Impaired/Different from baseline Area of Impairment: Attention;Following commands;Problem solving;Safety/judgement                   Current Attention Level: Sustained   Following Commands: Follows one step commands with increased time Safety/Judgement: Decreased awareness of safety   Problem Solving: Slow processing;Requires verbal cues;Requires tactile cues General Comments: Pt lethargic upon arrival and with fatigue. Pt nodding yes/no inconsistently, does not verbalize at all throughout session even when cued to do so. Follows 1-step commands with LUE/LLE consistently.        Exercises     Shoulder Instructions       General Comments      Pertinent Vitals/ Pain       Pain Assessment: Faces Faces Pain Scale: Hurts little more Pain Location: RLE, during mobility Pain Descriptors / Indicators: Discomfort;Grimacing Pain Intervention(s): Limited activity within patient's tolerance;Monitored during session;Repositioned  Home Living                                          Prior Functioning/Environment              Frequency  Min 2X/week        Progress Toward Goals  OT Goals(current goals can now be found in the care plan section)  Progress towards OT goals: OT to reassess next treatment  Acute Rehab  OT Goals Patient Stated Goal: improve mobility OT Goal Formulation: With patient Time For Goal Achievement: 05/05/20 Potential to Achieve Goals: Good  Plan Discharge plan needs to be updated;Frequency needs to be updated;Other (comment) (OTR to downgrade goals)    Co-evaluation      Reason for Co-Treatment: For patient/therapist safety;Necessary to address cognition/behavior during functional activity;To address functional/ADL transfers PT goals addressed during session: Mobility/safety with mobility;Balance;Strengthening/ROM OT goals addressed during session: ADL's and self-care;Strengthening/ROM      AM-PAC OT "6 Clicks" Daily Activity     Outcome Measure   Help from another person eating meals?: Total (NPO) Help from another person taking care of personal grooming?: Total Help from another person toileting, which includes using toliet, bedpan, or urinal?: Total Help from another person bathing (including washing, rinsing, drying)?: Total Help from another person to put on and taking off regular upper body clothing?: Total Help from another person to put on and taking off regular lower body clothing?: Total 6 Click Score: 6    End of Session Equipment Utilized During Treatment: Gait belt;Other (comment) (stedy)  OT Visit Diagnosis: Unsteadiness on feet (R26.81);Other abnormalities of gait and mobility (R26.89);Muscle weakness (generalized) (M62.81);Pain Pain -  Right/Left: Right Pain - part of body: Shoulder;Arm;Hand   Activity Tolerance Patient limited by lethargy   Patient Left in bed;with call bell/phone within reach;with bed alarm set;with nursing/sitter in room   Nurse Communication Mobility status        Time: 0923-3007 OT Time Calculation (min): 40 min  Charges: OT General Charges $OT Visit: 1 Visit OT Treatments $Therapeutic Activity: 8-22 mins  Lenor Derrick., COTA/L Acute Rehabilitation Services [phone]    Barron Schmid 05/02/2020, 3:37 PM

## 2020-05-02 NOTE — Progress Notes (Signed)
STROKE TEAM PROGRESS NOTE   INTERVAL HISTORY Daughter at the bedside. Pt pulled off his cortrak Friday night and just replaced this am. Currently on TF @ 25 and D5NS @ 75. He is lethargic but open eyes on voice and followed all simple commands. Still has right UE flaccid and expressive aphasia.    Vitals:   05/01/20 1719 05/01/20 1952 05/02/20 0001 05/02/20 0336  BP: 134/78 121/77 125/75 133/87  Pulse: 94 95 87 97  Resp: 17 17 18 20   Temp: (!) 97.5 F (36.4 C) 98 F (36.7 C) 99.7 F (37.6 C) 98 F (36.7 C)  TempSrc: Oral Oral Axillary Axillary  SpO2: 96% 94% 95% 100%  Weight:      Height:       CBC:  Recent Labs  Lab 05/01/20 0102 05/02/20 0242  WBC 10.5 8.4  HGB 12.5* 11.5*  HCT 37.4* 34.7*  MCV 91.4 92.0  PLT 223 [id number]   Basic Metabolic Panel:  Recent Labs  Lab 04/26/20 0210 04/26/20 2108 04/27/20 0253 04/28/20 1600 04/29/20 0301 04/30/20 0525 05/02/20 0741  NA 138   < > 141 139   < > 137 145  K 3.8   < > 4.0 3.9   < > 3.7 3.4*  CL 105   < > 105 107   < > 108 115*  CO2 22   < > 26 22   < > 23 22  GLUCOSE 96   < > 98 100*   < > 141* 132*  BUN 16   < > 13 16   < > 16 29*  CREATININE 0.77   < > 0.91 0.77   < > 0.76 1.30*  CALCIUM 8.3*   < > 8.7* 8.2*   < > 8.0* 8.2*  MG 1.9  --  2.1 1.9  --   --   --   PHOS 3.6  --  3.1  --   --   --   --    < > = values in this interval not displayed.   Lipid Panel:  Recent Labs  Lab 04/28/20 0135  CHOL 104  TRIG 66  HDL 30*  CHOLHDL 3.5  VLDL 13  LDLCALC 61   HgbA1c:  Recent Labs  Lab 04/28/20 0135  HGBA1C 5.1   Urine Drug Screen: No results for input(s): LABOPIA, COCAINSCRNUR, LABBENZ, AMPHETMU, THCU, LABBARB in the last 168 hours.  Alcohol Level No results for input(s): ETH in the last 168 hours.  IMAGING past 24 hours No results found.  PHYSICAL EXAM  Temp:  [97.5 F (36.4 C)-99.7 F (37.6 C)] 99.3 F (37.4 C) (12/27 1217) Pulse Rate:  [87-99] 93 (12/27 1217) Resp:  [17-20] 18 (12/27 1217) BP:  (121-134)/(75-88) 127/84 (12/27 1217) SpO2:  [94 %-100 %] 96 % (12/27 1217)  General - Well nourished, well developed, lethargic. Surgical wound from TCAR procedure appears to be healing well without any bleeding from the left neck.   Ophthalmologic - fundi not visualized due to noncooperation.  Cardiovascular - Regular rhythm and rate.  Neuro - lethargic, open eyes on voice but close if no constant voice or tactile stimulation. Following all simple commands, but nonverbal, not able to have language output, shook or nodded for orientation questions but not able to speak. No gaze palsy, tracking bilaterally, PERRL, blinking to visual threat bilaterally. Right lower facial weakness. Tongue midline. Right hemi-plegia with right upper extremity 0/5 strength and on the right right lower extremity 2/5 proximal and 4+/5  distal DF/PF. Good strength on the left. Sensation subjectively symmetrical. FTN slow on the left but intact. Gait not tested  ASSESSMENT/PLAN John Lambert is a 79 y.o. male with history of L hemispheric infarcts secondary to symptomatic 80% left proximal carotid stenosis with a soft plaque on long-term AC for blood clotting disorder who underwent L carotid TCAR 04/27/2020. In PACU Post op developed aphasia w/ L gaze and R hemiparesis. Taken for emergent cerebral angio after CT, CTA neg for occlusion.   Stroke:scattered LACA andMCA/ACAinfarcts likely severe L ICA stenosis  Stroke extension: followed by left TCAR with new embolic left MCA infarcts  MRI admission  Numerous scattered punctate L posterior frontal infarct w/ microembolic infarcts ACA/MCA territory and Punctate infarct corpus callosum. Old L parietal cortical and subcortical infarcts.  CTA head & neck ulcerated L ICA bifurcation w/ severe proximal 80% stenosis. Moderate intradural L VA stenosis. RUL w/ ground glass appearance.   MRI 12/23 (post TCAR) new scattered L basal ganglia, L frontoparietal cortical and  subcortical infarcts. Subacute L medial frontoparietal infarct.   Carotid Doppler  L ICA 80-99%  2D Echo w/ bubble EF 60-65%. No source of embolus   EEG neg sz  LDL 84  HgbA1c 5.3  VTE prophylaxis - warfarin / Heparin IV  warfarin daily prior to admission, now on coumadin with heparin IV bridg. Continue aspirin, plavix with warfarin x 1 mo -> aspirin + coumadin  Therapy recommendations:  CIR->reassessment->SNF  Disposition:  pending   follow up with stroke clinic NP at [facility] 4 weeks following rehab discharge  Symptomatic L ICA Stenosis   CTA head & neck ulcerated L ICA bifurcation w/ severe proximal 80% stenosis.  Carotid Doppler  L ICA 80-99%  TCAR 12/22/2021with [facility]  Post op neuro worsening  CT, CTA, Cerebral angio all neg for occlusion. MRI shows new L brain scattered small infarcts  Now on ASA, plavix and anticoagulation triple therapy for one month.   Blood Clotting D/O Hx DVT, PE  Home anticoagulation:  warfarin daily, continued in the hospital  Admission INR 2.2  LE doppler partial non-obstructed age indeterminate DVT R common femoral and R popliteal vein, likely chronic   Post TCAR, on lovenox bridge->change to IV heparin d/t new strokes and new AF  INR 1.3->1.9  Long-term AC w/ warfarin  Paroxysmal Atrial Fibrillation, new dx  Home anticoagulation:  warfarin daily for clotting d/o, hx PE, DVT  CHA2DS2-VASc Score = 5, ?2 oral anticoagulation recommended  Age in Years:  ?24   +2    Sex:  Male   0    Hypertension History:   +1     Diabetes Mellitus:  0  Congestive Heart Failure History:  0  Vascular Disease History:  0     Stroke/TIA/Thromboembolism History:  yes   +2 . Heparin gtt and bridge to warfarin. Continue aspirin, plavix with warfarin x 1 mo  -> aspirin + Warfarin. . INR 1.3->1.9   AKI . Cre 0.76->1.3  . Na 137->145 . likely prerenal . On IVF and TF  Hypertension  Off Cleviprex gtt   On coreg bid  Long term goal  normotensive  Hyperlipidemia  Home meds:  pravachol 20  Now on pravachol 40  LDL 84, goal < 70  Continue statin at discharge  Dysphagia . Secondary to stroke . NPO . cortrak placed 12/24 (pt removed later that day) -> replaced 12/27 . On TF . Speech on board   Other Stroke Risk Factors  Advanced  Age >/= 78   Former Cigarette smoker  ETOH use, advised to drink no more than 2 drink(s) a day  Other Active Problems  Mild leukocytosis - WBC's 11.3->10.5->8.4 - resolved  Hypokalemia 3.4 - supplement,  Mag normal  Hospital day # 11  I had long discussion with daughter at bedside, updated pt current condition, treatment plan and potential prognosis, and answered all the questions. She expressed understanding and appreciation. I spent  35 minutes in total face-to-face time with the patient, more than 50% of which was spent in counseling and coordination of care, reviewing test results, images and medication, and discussing the diagnosis, treatment plan and potential prognosis. This patient's care requiresreview of multiple databases, neurological assessment, discussion with family, other specialists and medical decision making of high complexity.  Rosalin Hawking, MD PhD Stroke Neurology 05/02/2020 7:16 PM   To contact Stroke Continuity provider, please refer to [email or web address] After hours, contact General Neurology

## 2020-05-02 NOTE — Plan of Care (Signed)
  Problem: Ischemic Stroke/TIA Tissue Perfusion: Goal: Complications of ischemic stroke/TIA will be minimized Outcome: Progressing   Problem: Nutrition: Goal: Risk of aspiration will decrease Outcome: Progressing Goal: Dietary intake will improve Outcome: Progressing   Problem: Education: Goal: Knowledge of disease or condition will improve Outcome: Progressing Goal: Knowledge of secondary prevention will improve Outcome: Progressing Goal: Knowledge of patient specific risk factors addressed and post discharge goals established will improve Outcome: Progressing Goal: Individualized Educational Video(s) Outcome: Progressing   Problem: Coping: Goal: Level of anxiety will decrease Outcome: Progressing

## 2020-05-02 NOTE — Progress Notes (Addendum)
  Progress Note    05/02/2020 7:09 AM 5 Days Post-Op  Subjective:  Alert and follows commands. Daughter at bedside.   Vitals:   05/02/20 0001 05/02/20 0336  BP: 125/75 133/87  Pulse: 87 97  Resp: 18 20  Temp: 99.7 F (37.6 C) 98 F (36.7 C)  SpO2: 95% 100%    Physical Exam: Cardiac:  RRR Lungs:  Non-labored Incisions:  Left ant chest incision well approximated with mild edema and ecchymosis. Extremities:  No motor of right UE. Can wiggle both feet and able to lift right leg slightly off bed.    CBC    Component Value Date/Time   WBC 8.4 05/02/2020 0242   RBC 3.77 (L) 05/02/2020 0242   HGB 11.5 (L) 05/02/2020 0242   HCT 34.7 (L) 05/02/2020 0242   PLT 240 05/02/2020 0242   MCV 92.0 05/02/2020 0242   MCH 30.5 05/02/2020 0242   MCHC 33.1 05/02/2020 0242   RDW 13.3 05/02/2020 0242   LYMPHSABS 1.3 04/21/2020 0423   MONOABS 0.8 04/21/2020 0423   EOSABS 0.2 04/21/2020 0423   BASOSABS 0.0 04/21/2020 0423    BMET    Component Value Date/Time   NA 137 04/30/2020 0525   K 3.7 04/30/2020 0525   CL 108 04/30/2020 0525   CO2 23 04/30/2020 0525   GLUCOSE 141 (H) 04/30/2020 0525   BUN 16 04/30/2020 0525   CREATININE 0.76 04/30/2020 0525   CALCIUM 8.0 (L) 04/30/2020 0525   GFRNONAA >60 04/30/2020 0525     Intake/Output Summary (Last 24 hours) at 05/02/2020 0709 Last data filed at 05/02/2020 0045 Gross per 24 hour  Intake 350 ml  Output 820 ml  Net -470 ml    HOSPITAL MEDICATIONS Scheduled Meds: . aspirin  81 mg Oral Daily  . carvedilol  25 mg Oral BID WC  . Chlorhexidine Gluconate Cloth  6 each Topical Daily  . clopidogrel  75 mg Oral Daily  . feeding supplement (PROSource TF)  45 mL Per Tube Daily  . pravastatin  40 mg Oral Daily  . Warfarin - Pharmacist Dosing Inpatient   Does not apply q1600   Continuous Infusions: . dextrose 5 % and 0.9% NaCl 70 mL/hr at 05/01/20 1715  . feeding supplement (JEVITY 1.2 CAL) 1,000 mL (04/29/20 1402)  . heparin 1,250  Units/hr (05/01/20 1819)   PRN Meds:.acetaminophen **OR** acetaminophen (TYLENOL) oral liquid 160 mg/5 mL **OR** acetaminophen, acetaminophen, cyclobenzaprine  Assessment: POD 5 left TCAR; left brain stroke.   Per neurology: scattered LACA andMCA / ACA territoryinfarcts most likely thrombembolicd/t severe L ICA stenosisfollowed by elective left carotidTCARprocedure and stenting procedure complicated by new embolic left MCA branch infarcts  Continues to have severe expressive aphasia and right hemiparesis. Able to lift right leg off bed slightly   Plan: -Heparin infusion continues. On Plavix and statin. Incision with small, non-expanding hematoma - CIR reassessment in place -DVT prophylaxis: Heparin infusion   Risa Grill, PA-C [facility] [phone] 05/02/2020  7:09 AM

## 2020-05-02 NOTE — Progress Notes (Signed)
Physical Therapy Treatment Patient Details Name: John Lambert MRN: [id number] DOB: 06-Jan-1941 Today's Date: 05/02/2020    History of Present Illness John Lambert is a 79 y.o. male admitted 12/14 with bil LE weakness and difficulty walking as well as left hip pain. Imaging notable for Numerous scattered punctate acute infarctions in the left  posterior frontal region consistent with micro embolic infarctions. Punctate acute infarction also affecting the splenium of  the corpus callosum.  Pt with L carotid artery stenosis s/p stent on 04/27/20. Post procedure pt with right hemiparesis, aphasia and left gaze with intraprocedural stroke. 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    PT Comments    Pt is lethargic upon PT and OT arrival to room. Per pt's daughter at bedside, pt has just gotten cortrak back today after pt removed it on Friday which pt's daughter feels ps contributing to pt's fatigue. Pt is requiring total +2 assist for bed mobility and transfer to stand at this time. Pt tolerates EOB sitting with mod-max posterior assist x10 minutes this day, with cues for righting balance via LUE. Pt is very aphasic and reluctant to attempt vocalizations. PT now recommending SNF level of care post-acutely, PT anticipates pt will need slower pace of rehabilitation post-acutely.    Follow Up Recommendations  SNF;Supervision/Assistance - 24 hour     Equipment Recommendations  Wheelchair (measurements PT);Wheelchair cushion (measurements PT);Hospital bed (mechanical lift, all if D/C home)    Recommendations for Other Services       Precautions / Restrictions Precautions Precautions: Fall Restrictions Weight Bearing Restrictions: No    Mobility  Bed Mobility Overal bed mobility: Needs Assistance Bed Mobility: Rolling;Supine to Sit;Sit to Supine Rolling: Total assist;+2 for physical assistance   Supine to sit: Total assist;+2  for physical assistance Sit to supine: Total assist;+2 for physical assistance   General bed mobility comments: total +2 for rolling for pericare, trunk and LE management scooting to and from EOB when performing supine<>sit, and boost up in bed with boost function and bed pads upon return to supine.  Transfers Overall transfer level: Needs assistance Equipment used: 2 person hand held assist;Ambulation equipment used Transfers: Sit to/from Stand Sit to Stand: Total assist;+2 physical assistance;+2 safety/equipment;From elevated surface         General transfer comment: Total +2 for all aspects, especially RUE/RLE management, initial power up, and rise. Pt stood in stedy upright position x1 minute, fatiguing demonstrated in eye closing and forward flexing posture.  Ambulation/Gait                 Stairs             Wheelchair Mobility    Modified Rankin (Stroke Patients Only) Modified Rankin (Stroke Patients Only) Pre-Morbid Rankin Score: No significant disability Modified Rankin: Severe disability     Balance Overall balance assessment: Needs assistance Sitting-balance support: Single extremity supported;Feet supported Sitting balance-Leahy Scale: Poor Sitting balance - Comments: mod-max posterior assist to maintain upright sitting, aided by use of LUE HHA Postural control: Right lateral lean Standing balance support: Bilateral upper extremity supported Standing balance-Leahy Scale: Zero Standing balance comment: total +2 to reach standing                            Cognition Arousal/Alertness: Awake/alert Behavior During Therapy: Flat affect Overall Cognitive Status: Impaired/Different from baseline Area of Impairment: Attention;Following commands;Problem solving;Safety/judgement  Current Attention Level: Sustained   Following Commands: Follows one step commands with increased time Safety/Judgement: Decreased  awareness of safety   Problem Solving: Slow processing;Requires verbal cues;Requires tactile cues General Comments: Pt lethargic upon arrival and with fatigue. Pt nodding yes/no inconsistently, does not verbalize at all throughout session even when cued to do so. Follows 1-step commands with LUE/LLE consistently.      Exercises      General Comments        Pertinent Vitals/Pain Pain Assessment: Faces Faces Pain Scale: Hurts little more Pain Location: RLE, during mobility Pain Descriptors / Indicators: Discomfort;Grimacing Pain Intervention(s): Limited activity within patient's tolerance;Monitored during session;Repositioned    Home Living                      Prior Function            PT Goals (current goals can now be found in the care plan section) Acute Rehab PT Goals Patient Stated Goal: improve mobility PT Goal Formulation: With patient/family Time For Goal Achievement: 05/13/20 Potential to Achieve Goals: Fair Progress towards PT goals: Progressing toward goals    Frequency    Min 3X/week      PT Plan Discharge plan needs to be updated    Co-evaluation PT/OT/SLP Co-Evaluation/Treatment: Yes Reason for Co-Treatment: For patient/therapist safety;To address functional/ADL transfers;Necessary to address cognition/behavior during functional activity PT goals addressed during session: Mobility/safety with mobility;Balance;Strengthening/ROM        AM-PAC PT "6 Clicks" Mobility   Outcome Measure  Help needed turning from your back to your side while in a flat bed without using bedrails?: Total Help needed moving from lying on your back to sitting on the side of a flat bed without using bedrails?: Total Help needed moving to and from a bed to a chair (including a wheelchair)?: Total Help needed standing up from a chair using your arms (e.g., wheelchair or bedside chair)?: Total Help needed to walk in hospital room?: Total Help needed climbing 3-5  steps with a railing? : Total 6 Click Score: 6    End of Session Equipment Utilized During Treatment: Gait belt Activity Tolerance: Patient limited by fatigue Patient left: with call bell/phone within reach;in bed;with bed alarm set Nurse Communication: Mobility status;Need for lift equipment PT Visit Diagnosis: Other abnormalities of gait and mobility (R26.89);Muscle weakness (generalized) (M62.81);History of falling (Z91.81)     Time: [id number] PT Time Calculation (min) (ACUTE ONLY): 40 min  Charges:  $Therapeutic Activity: 8-22 mins $Neuromuscular Re-education: 8-22 mins                    Stacie Glaze, PT Acute Rehabilitation Services Pager [phone]  Office [phone]   Roxine Caddy E Ruffin Pyo 05/02/2020, 1:38 PM

## 2020-05-02 NOTE — Progress Notes (Signed)
ANTICOAGULATION CONSULT NOTE  Pharmacy Consult for heparin infusion, warfarin Indication: on PTA warfarin for hx of DVT/PE, new acute stroke, new Afib  Allergies  Allergen Reactions  . Hctz [Hydrochlorothiazide] Other (See Comments)    dizzy  . Hydrocodone Itching  . Vicodin [Hydrocodone-Acetaminophen] Itching    Patient Measurements: Height: 6\' 2"  (188 cm) Weight: 87.1 kg (192 lb 0.3 oz) IBW/kg (Calculated) : 82.2  Heparin weight: 75.8 kg  Vital Signs: Temp: 98 F (36.7 C) (12/27 0908) Temp Source: Oral (12/27 0908) BP: 130/88 (12/27 0908) Pulse Rate: 99 (12/27 0908)  Labs: Recent Labs    04/30/20 0525 04/30/20 1400 05/01/20 0102 05/01/20 0931 05/02/20 0242 05/02/20 0741  HGB 12.8*  --  12.5*  --  11.5*  --   HCT 39.5  --  37.4*  --  34.7*  --   PLT 200  --  223  --  240  --   LABPROT 15.1  --  15.7*  --  21.1*  --   INR 1.2  --  1.3*  --  1.9*  --   HEPARINUNFRC 0.27*   < > 0.30 0.39 0.34  --   CREATININE 0.76  --   --   --   --  1.30*   < > = values in this interval not displayed.    Estimated Creatinine Clearance: 51.8 mL/min (A) (by C-G formula based on SCr of 1.3 mg/dL (H)).  Assessment: 79 yo male with L symptomatic carotid stenosis.  S/p L carotid TCAR procedure w/ stenting that was complicated by postprocedure stroke.  MRI brain shows patchy L basal ganglia and L frontoparietal cortical and subcortical infarcts (new).  No hemorrhagic transformation.  Pharmacy consulted to switch treatment dose Lovenox to heparin drip given new stroke and new onset atrial fibrillation.   PTA warfarin dose: 2.5mg  daily except 1.25mg  on Mondays  12/27: Today's INR subtherapeutic at 1.9 (yesterday's INR was 1.3). Anticipating further INR increase given warfarin dose increase from PTA dose. Has received 5mg  x2 days.  Will slightly reduce dose to 2.5mg  today in anticipation of further INR increase. Daily heparin level therapeutic at 0.34 with heparin infusing at 1250 units/hr.   Nurse did note dark urine but not bloody - patient is likely dehydrated as Scr and BUN have risen over the past 2 days.  Otherwise, no bleeding and no issues with line.  CBC stable.   Goal of Therapy:  Heparin level 0.3-0.5 units/ml (more narrow goal due to recent stroke) Monitor platelets by anticoagulation protocol: Yes  INR goal 2-3   Plan: Continue heparin infusion at 1250 units/hr  Warfarin 2.5mg  PO x 1 today Monitor daily INR and heparin level, CBC, s/s bleeding  1/28, PharmD PGY-1 Acute Care Pharmacy Resident Office: [phone] 05/02/2020 9:34 AM

## 2020-05-02 NOTE — Procedures (Signed)
Cortrak  Person Inserting Tube:  Osa Craver, RD Tube Type:  Cortrak - 43 inches Tube Location:  Left nare Initial Placement:  Stomach Secured by: Bridle Technique Used to Measure Tube Placement:  Documented cm marking at nare/ corner of mouth Cortrak Secured At:  71 cm    .Cortrak Tube Team Note:  Consult received to place a Cortrak feeding tube. Per wife, tube was pulled out on Friday night. Plan for replacement tube today  No x-ray is required. RN may begin using tube.   If the tube becomes dislodged please keep the tube and contact the Cortrak team at [email or web address] (password [email or web address]) for replacement.  If after hours and replacement cannot be delayed, place a NG tube and confirm placement with an abdominal x-ray.   Romelle Starcher MS, RDN, LDN, CNSC Registered Dietitian III Clinical Nutrition RD Pager and On-Call Pager Number Located in [place]

## 2020-05-03 ENCOUNTER — Inpatient Hospital Stay (HOSPITAL_COMMUNITY): Payer: Medicare Other

## 2020-05-03 DIAGNOSIS — Y92009 Unspecified place in unspecified non-institutional (private) residence as the place of occurrence of the external cause: Secondary | ICD-10-CM

## 2020-05-03 DIAGNOSIS — N179 Acute kidney failure, unspecified: Secondary | ICD-10-CM

## 2020-05-03 DIAGNOSIS — I63132 Cerebral infarction due to embolism of left carotid artery: Secondary | ICD-10-CM | POA: Diagnosis not present

## 2020-05-03 DIAGNOSIS — R262 Difficulty in walking, not elsewhere classified: Secondary | ICD-10-CM

## 2020-05-03 DIAGNOSIS — Z7901 Long term (current) use of anticoagulants: Secondary | ICD-10-CM

## 2020-05-03 DIAGNOSIS — D689 Coagulation defect, unspecified: Secondary | ICD-10-CM

## 2020-05-03 DIAGNOSIS — R531 Weakness: Secondary | ICD-10-CM | POA: Diagnosis not present

## 2020-05-03 DIAGNOSIS — W19XXXA Unspecified fall, initial encounter: Secondary | ICD-10-CM

## 2020-05-03 DIAGNOSIS — R310 Gross hematuria: Secondary | ICD-10-CM

## 2020-05-03 DIAGNOSIS — I10 Essential (primary) hypertension: Secondary | ICD-10-CM | POA: Diagnosis not present

## 2020-05-03 LAB — BASIC METABOLIC PANEL
Anion gap: 6 (ref 5–15)
Anion gap: 8 (ref 5–15)
BUN: 38 mg/dL — ABNORMAL HIGH (ref 8–23)
BUN: 44 mg/dL — ABNORMAL HIGH (ref 8–23)
CO2: 21 mmol/L — ABNORMAL LOW (ref 22–32)
CO2: 20 mmol/L — ABNORMAL LOW (ref 22–32)
Calcium: 7.8 mg/dL — ABNORMAL LOW (ref 8.9–10.3)
Calcium: 7.9 mg/dL — ABNORMAL LOW (ref 8.9–10.3)
Chloride: 117 mmol/L — ABNORMAL HIGH (ref 98–111)
Chloride: 115 mmol/L — ABNORMAL HIGH (ref 98–111)
Creatinine, Ser: 1.4 mg/dL — ABNORMAL HIGH (ref 0.61–1.24)
Creatinine, Ser: 1.5 mg/dL — ABNORMAL HIGH (ref 0.61–1.24)
GFR, Estimated: 50 mL/min — ABNORMAL LOW (ref >60)
GFR, Estimated: 46 mL/min — ABNORMAL LOW (ref >60)
Glucose, Bld: 136 mg/dL — ABNORMAL HIGH (ref 70–99)
Glucose, Bld: 154 mg/dL — ABNORMAL HIGH (ref 70–99)
Potassium: 3.5 mmol/L (ref 3.5–5.1)
Potassium: 4.1 mmol/L (ref 3.5–5.1)
Sodium: 144 mmol/L (ref 135–145)
Sodium: 143 mmol/L (ref 135–145)

## 2020-05-03 LAB — URINALYSIS, ROUTINE W REFLEX MICROSCOPIC

## 2020-05-03 LAB — GLUCOSE, CAPILLARY
Glucose-Capillary: 109 mg/dL — ABNORMAL HIGH (ref 70–99)
Glucose-Capillary: 109 mg/dL — ABNORMAL HIGH (ref 70–99)
Glucose-Capillary: 117 mg/dL — ABNORMAL HIGH (ref 70–99)
Glucose-Capillary: 128 mg/dL — ABNORMAL HIGH (ref 70–99)
Glucose-Capillary: 133 mg/dL — ABNORMAL HIGH (ref 70–99)
Glucose-Capillary: 157 mg/dL — ABNORMAL HIGH (ref 70–99)

## 2020-05-03 LAB — CBC
HCT: 32.2 % — ABNORMAL LOW (ref 39.0–52.0)
Hemoglobin: 10.4 g/dL — ABNORMAL LOW (ref 13.0–17.0)
MCH: 30 pg (ref 26.0–34.0)
MCHC: 32.3 g/dL (ref 30.0–36.0)
MCV: 92.8 fL (ref 80.0–100.0)
Platelets: 233 10*3/uL (ref 150–400)
RBC: 3.47 MIL/uL — ABNORMAL LOW (ref 4.22–5.81)
RDW: 13.4 % (ref 11.5–15.5)
WBC: 9.1 10*3/uL (ref 4.0–10.5)
nRBC: 0 % (ref 0.0–0.2)

## 2020-05-03 LAB — CK: Total CK: 124 U/L (ref 49–397)

## 2020-05-03 LAB — HEPATIC FUNCTION PANEL
ALT: 35 U/L (ref 0–44)
AST: 34 U/L (ref 15–41)
Albumin: 2.4 g/dL — ABNORMAL LOW (ref 3.5–5.0)
Alkaline Phosphatase: 30 U/L — ABNORMAL LOW (ref 38–126)
Bilirubin, Direct: 0.1 mg/dL (ref 0.0–0.2)
Indirect Bilirubin: 0.5 mg/dL (ref 0.3–0.9)
Total Bilirubin: 0.6 mg/dL (ref 0.3–1.2)
Total Protein: 4.6 g/dL — ABNORMAL LOW (ref 6.5–8.1)

## 2020-05-03 LAB — URINALYSIS, MICROSCOPIC (REFLEX)
RBC / HPF: >50 RBC/hpf (ref 0–5)
Squamous Epithelial / HPF: NONE SEEN (ref 0–5)

## 2020-05-03 LAB — OSMOLALITY, URINE: Osmolality, Ur: 577 mOsm/kg (ref 300–900)

## 2020-05-03 LAB — PROTIME-INR
INR: 2.5 — ABNORMAL HIGH (ref 0.8–1.2)
Prothrombin Time: 25.9 seconds — ABNORMAL HIGH (ref 11.4–15.2)

## 2020-05-03 LAB — CREATININE, URINE, RANDOM: Creatinine, Urine: 136.69 mg/dL

## 2020-05-03 LAB — SODIUM, URINE, RANDOM: Sodium, Ur: 45 mmol/L

## 2020-05-03 LAB — HEPARIN LEVEL (UNFRACTIONATED): Heparin Unfractionated: 0.31 IU/mL (ref 0.30–0.70)

## 2020-05-03 IMAGING — US US RENAL
1 series · 14 of 25 positions shown · non-contrast
Comparison: No recent.

CLINICAL DATA: Acute renal injury.

EXAM:
RENAL / URINARY TRACT ULTRASOUND COMPLETE

[Series 1: us renal · 14 of 52 slices shown]
[im 1/52]
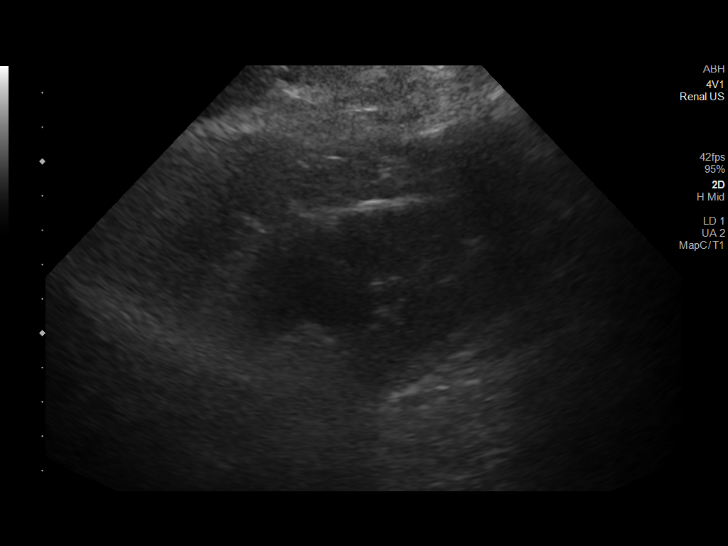
[im 5/52]
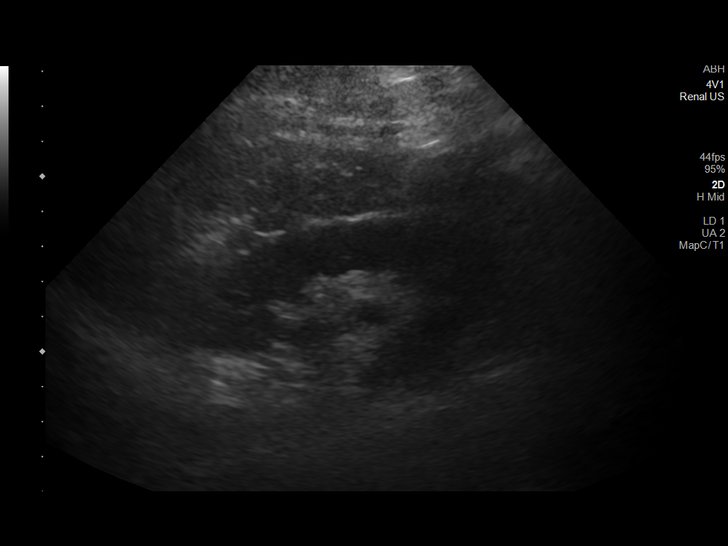
[im 9/52]
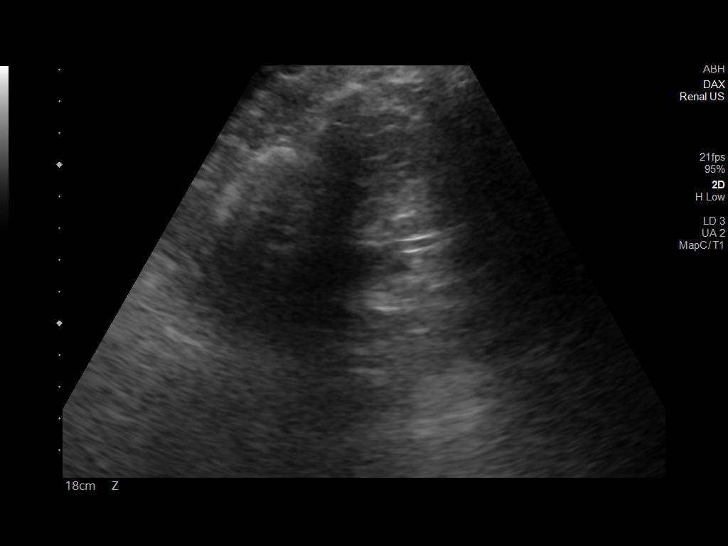
[im 13/52]
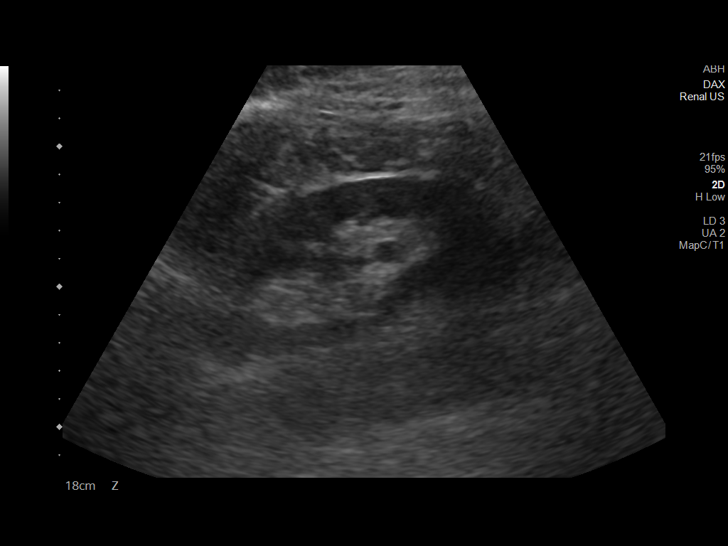
[im 18/52]
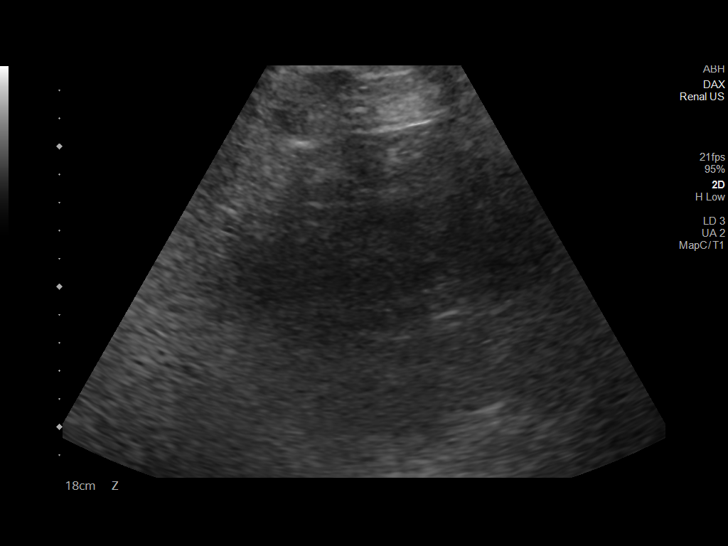
[im 20/52]
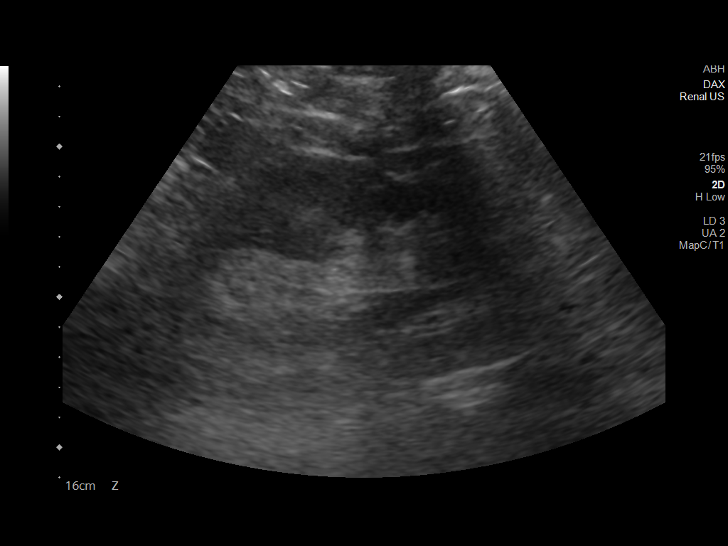
[im 24/52]
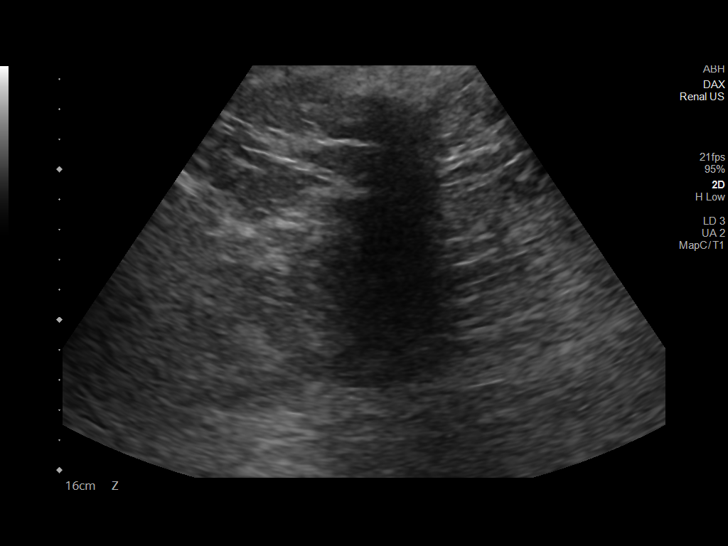
[im 28/52]
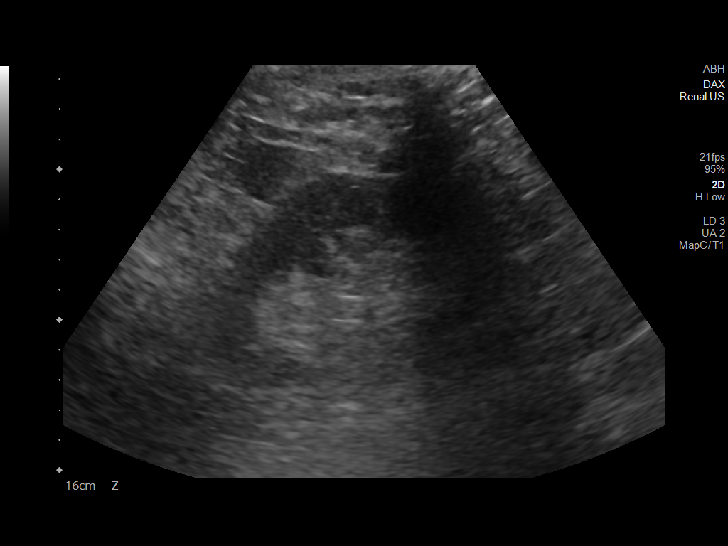
[im 32/52]
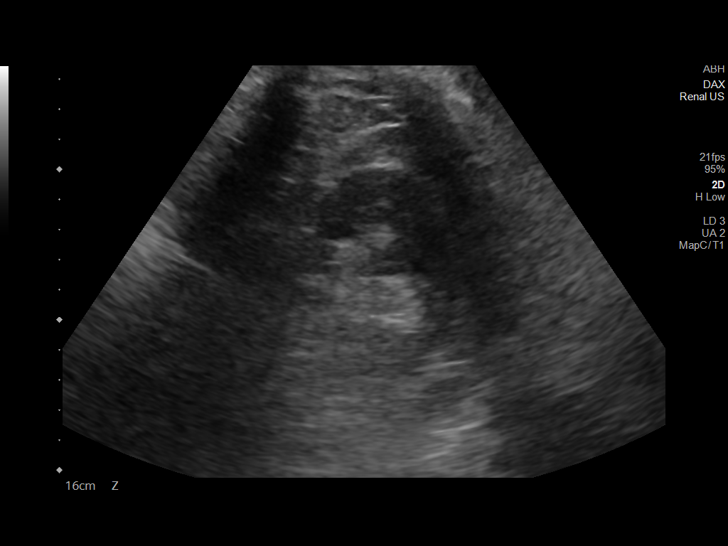
[im 35/52]
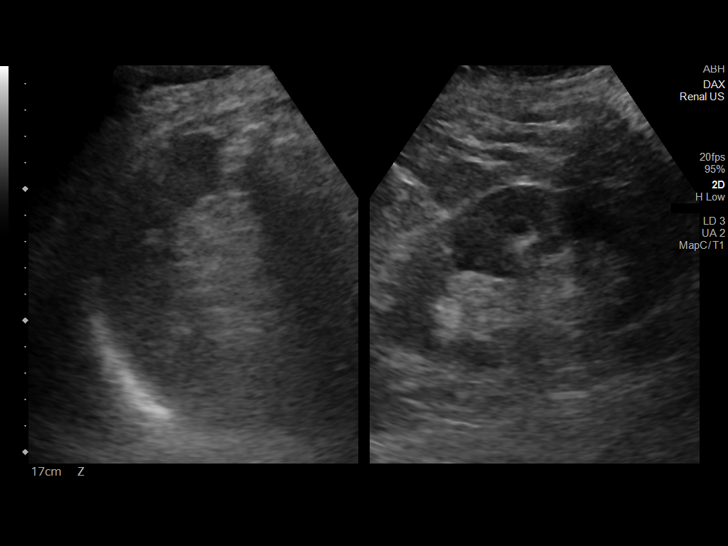
[im 39/52]
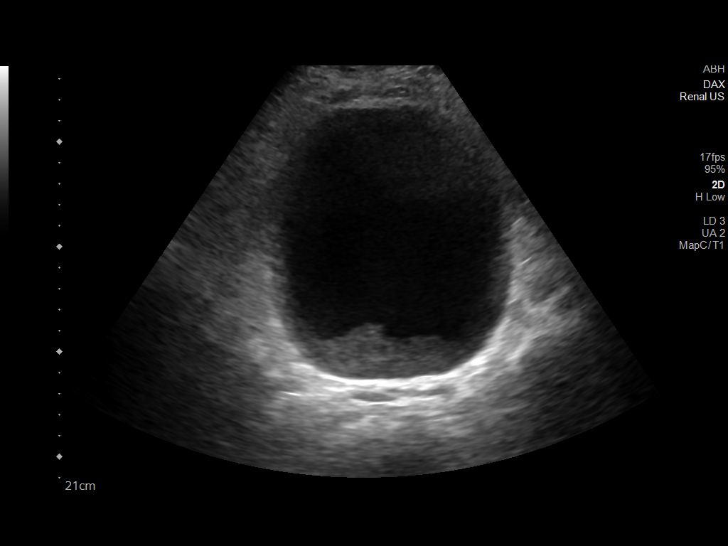
[im 43/52]
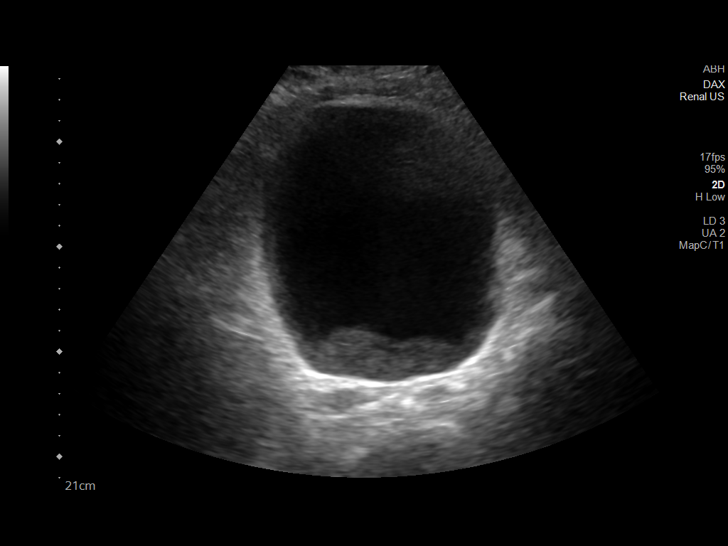
[im 47/52]
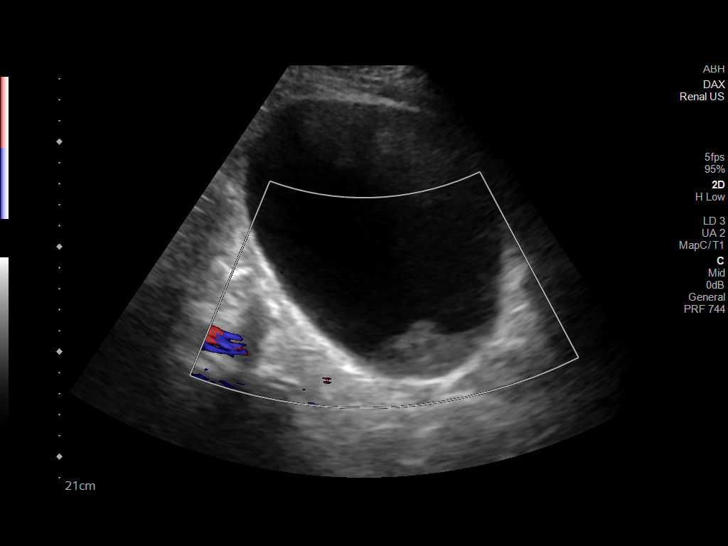
[im 52/52]
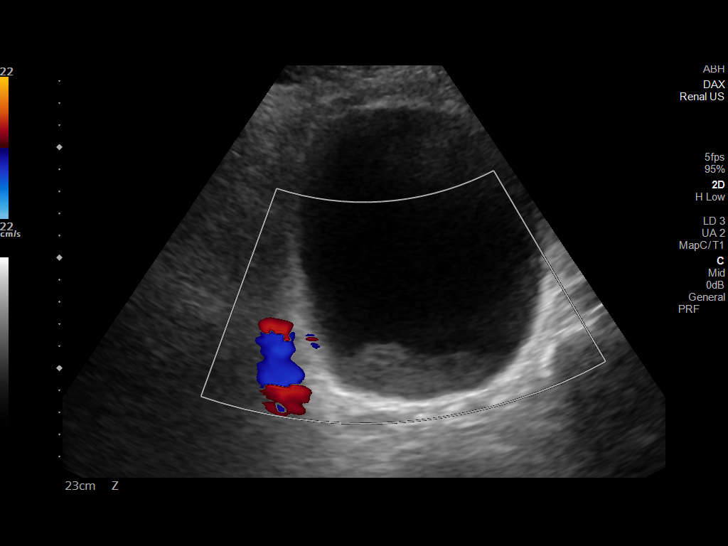

[14 of 25 positions shown; findings below may reference images not displayed]

FINDINGS: Right Kidney:

Renal measurements: 11.8 x 5.4 x 5.4 cm = volume: 178.8 mL.
Increased echogenicity. No mass. Mild prominence of the right renal
pelvis noted.

Left Kidney:

Renal measurements: 12.5 x 6.6 x 5.9 cm = volume: 252.8 mL.
Increased echogenicity. No mass or hydronephrosis visualized.

Bladder:

Bladder is distended and contains debris. Bladder infection could
present this fashion. Bilateral ureteral jets visualized. Prevoid
volume 951.9 cc.

Other:

None.
IMPRESSION: 1. Bladder is distended and contains debris. Bladder infection could
present this fashion. Prevoid volume 951.9 CC. Mild prominence right
renal pelvis noted.
2. Increased echogenicity both kidneys consistent with chronic
medical renal disease.

## 2020-05-03 MED ORDER — POTASSIUM CHLORIDE 10 MEQ/100ML IV SOLN
10.0000 meq | INTRAVENOUS | Status: AC
  Administered 2020-05-03 (×3): 10 meq via INTRAVENOUS
  Filled 2020-05-03 (×3): qty 100

## 2020-05-03 NOTE — Progress Notes (Signed)
°  °  Patient now with feeding tube and npo. snf on discharge. Vascular available as needed. Will f/u with Dr. Myra Gianotti after snf discharge.  Lemar Livings, MD

## 2020-05-03 NOTE — Progress Notes (Signed)
OT Cancellation Note  Patient Details Name: TAYDEN NICHELSON MRN: [id number] DOB: Sep 02, 1940   Cancelled Treatment:    Reason Eval/Treat Not Completed: Patient at procedure or test/ unavailable;Other (comment) patient off floor for procedure. Will check back as time allows.   Kallie Edward OTR/L Supplemental OT, Department of rehab services [phone]  Maple Odaniel R H. 05/03/2020, 12:28 PM

## 2020-05-03 NOTE — Progress Notes (Addendum)
ANTICOAGULATION CONSULT NOTE  Pharmacy Consult for heparin infusion, warfarin Indication: on PTA warfarin for hx of DVT/PE, new acute stroke, new Afib  Patient Measurements: Height: 6\' 2"  (188 cm) Weight: 87.1 kg (192 lb 0.3 oz) IBW/kg (Calculated) : 82.2   Vital Signs: Temp: 99.9 F (37.7 C) (12/28 0817) Temp Source: Oral (12/28 0817) BP: 135/80 (12/28 0817) Pulse Rate: 90 (12/28 0817)  Labs: Recent Labs    05/01/20 0102 05/01/20 0931 05/02/20 0242 05/02/20 0741 05/03/20 0430  HGB 12.5*  --  11.5*  --  10.4*  HCT 37.4*  --  34.7*  --  32.2*  PLT 223  --  240  --  233  LABPROT 15.7*  --  21.1*  --  25.9*  INR 1.3*  --  1.9*  --  2.5*  HEPARINUNFRC 0.30 0.39 0.34  --  0.31  CREATININE  --   --   --  1.30* 1.40*  CKTOTAL  --   --   --   --  124    Estimated Creatinine Clearance: 48.1 mL/min (A) (by C-G formula based on SCr of 1.4 mg/dL (H)).  Assessment: 79 yo male with L symptomatic carotid stenosis.  S/p L carotid TCAR procedure w/ stenting that was complicated by postprocedure stroke.  MRI brain shows patchy L basal ganglia and L frontoparietal cortical and subcortical infarcts (new).  No hemorrhagic transformation.  Pharmacy consulted to switch treatment dose Lovenox to heparin drip given new stroke and new onset atrial fibrillation.   PTA warfarin dose: 2.5mg  daily except 1.25mg  on Mondays  12/27: Today's INR therapeutic at 2.5 (yesterday's INR was 1.9) Anticipating further INR increase given warfarin dose increase from PTA dose. Has received 5mg  x2 days.  Will hold the dose today.   Daily heparin level was therapeutic at 0.31 with heparin infusing at 1250 units/hr.  Nurse did note dark urine, concern for hematuria. Urinanalysis ordered, heparin discontinued. No issues with line.  Hgb has downtrended from 16 to 10 over 1 week. Platelets are stable, normal in the 200s, urine analysis positive for red blood cells >50.  Goal of Therapy:  Heparin level 0.3-0.5 units/ml  (more narrow goal due to recent stroke) Monitor platelets by anticoagulation protocol: Yes  INR goal 2-3   Plan: Heparin infusion disontinued Warfarin held today Monitor daily INR, CBC, s/s bleeding Monitor hematuria  1/28, PharmD PGY1 Pharmacy Resident 05/03/2020 8:35 AM Office: [phone] (until 4pm)  Please check amion for all pharmacy numbers if after hours  Addendum: Per neuro team (Dr. 05/05/2020) okay to switch to DOAC, once INR < 2.0 Will hold warfarin

## 2020-05-03 NOTE — Progress Notes (Signed)
  Speech Language Pathology Treatment: Dysphagia;Cognitive-Linquistic  Patient Details Name: John Lambert MRN: [id number] DOB: May 06, 1941 Today's Date: 05/03/2020 Time: 0865-7846 SLP Time Calculation (min) (ACUTE ONLY): 31 min  Assessment / Plan / Recommendation Clinical Impression  Intervention focused on education, skilled observation, therapeutic exercise and treatment for language-apraxia. Oral care provided removing mild phlegm from soft palate and educated re: importance of oral hygiene particularly in pt's who are NPO and mouth breathing. Pharyngeal residue indicators present with trials puree of multiple swallows as noted during MBS and delayed throat clear. He produced effortful swallows on command x 5. Masako exercise attempted to facilitate base of tongue movement. He could produce a swallow but not keep tongue protruded during the swallow.  Throat clearing on command produced x 5 and produced vowel phoneme and able to imitate varying intonation for "uh huh". Visual and verbal cues provided to shape and approximate sound into /m/ was not successful this session. Simple yes/no related to pt and environment were accurate but mildly abstract y/n was less accurate.    Pt now going to SNF for rehabilitation and has Cortrak for nutrition. A repeat MBS recommended prior to decision re: po's versus more long term alternate means of nutrition. Possibly for tomorrow. ST will follow up.    HPI HPI: Pt is an 79 y.o. 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 He also has pain in left upper leg/hip region, and R foot drag; Imaging notable for Numerous scattered punctate acute infarctions in the left  posterior frontal region consistent with micro embolic infarctions  which could either be in the anterior or middle cerebral artery   territory. Punctate acute infarction also affecting the splenium of  the corpus callosum. Pt was seen by SLP on 12/17 for a BSE and SLE with no subsequent need for SLP services. Left carotid TCAR procedure with stent placement conducted on 12/22. Following the procedure he was found to be aphasic unable to speak with left gaze deviation and right hemiparesis. Emergent CTA showed patent left carotid stent with good flow in the terminal left ICA as well as middle cerebral artery in the M1 segment. There appeared to be decreased flow in one of the M2 branches by neurology. SLP was consulted due to pt failing the [facility] Screen, but a failed swallow screen has not been documented in EMR. EEG 12/22: No seizures      SLP Plan  Continue with current plan of care       Recommendations  Diet recommendations: NPO;Other(comment) (puree bites) Medication Administration: Crushed with puree                Oral Care Recommendations: Oral care QID Follow up Recommendations: Skilled Nursing facility SLP Visit Diagnosis: Dysphagia, oropharyngeal phase (R13.12);Aphasia (R47.01);Apraxia (R48.2) Plan: Continue with current plan of care                       Royce Macadamia 05/03/2020, 9:55 AM Breck Coons Lonell Face.Ed Nurse, children's [phone] Office [phone]

## 2020-05-03 NOTE — Progress Notes (Signed)
STROKE TEAM PROGRESS NOTE   INTERVAL HISTORY Daughter and wife are at the bedside. Pt lying in bed, still lethargic, able to open eyes on voice but keeps eyes closed without continuous stimulation. Able to follow simple commands but still has nonverbal, RUE flaccid, RLE paresis. Has gross hematuria, now INR 2.5, heparin IV discontinued. Hb from peak 16 down to today 10.4, pharmacy recommend to hold off coumadin for now. Discussed with family about coumadin vs. DOAC in the future, they want to stick with coumadin in the future.  Vitals:   05/02/20 2001 05/03/20 0012 05/03/20 0428 05/03/20 0817  BP: 125/69 129/79 122/63 135/80  Pulse: 86 97 98 90  Resp: 18 20 20 18   Temp: 97.6 F (36.4 C) 98.5 F (36.9 C) 98.4 F (36.9 C) 99.9 F (37.7 C)  TempSrc: Oral Oral Oral Oral  SpO2: 95% 96% 93% 95%  Weight:      Height:       CBC:  Recent Labs  Lab 05/02/20 0242 05/03/20 0430  WBC 8.4 9.1  HGB 11.5* 10.4*  HCT 34.7* 32.2*  MCV 92.0 92.8  PLT 240 0000000   Basic Metabolic Panel:  Recent Labs  Lab 04/27/20 0253 04/28/20 1600 04/29/20 0301 05/02/20 0741 05/03/20 0430  NA 141 139   < > 145 144  K 4.0 3.9   < > 3.4* 3.5  CL 105 107   < > 115* 117*  CO2 26 22   < > 22 21*  GLUCOSE 98 100*   < > 132* 136*  BUN 13 16   < > 29* 38*  CREATININE 0.91 0.77   < > 1.30* 1.40*  CALCIUM 8.7* 8.2*   < > 8.2* 7.8*  MG 2.1 1.9  --  2.1  --   PHOS 3.1  --   --   --   --    < > = values in this interval not displayed.   Lipid Panel:  Recent Labs  Lab 04/28/20 0135  CHOL 104  TRIG 66  HDL 30*  CHOLHDL 3.5  VLDL 13  LDLCALC 61   HgbA1c:  Recent Labs  Lab 04/28/20 0135  HGBA1C 5.1   Urine Drug Screen: No results for input(s): LABOPIA, COCAINSCRNUR, LABBENZ, AMPHETMU, THCU, LABBARB in the last 168 hours.  Alcohol Level No results for input(s): ETH in the last 168 hours.  IMAGING past 24 hours No results found.  PHYSICAL EXAM    Temp:  [97.6 F (36.4 C)-99.9 F (37.7 C)] 99.9 F  (37.7 C) (12/28 0817) Pulse Rate:  [82-98] 90 (12/28 0817) Resp:  [18-20] 18 (12/28 0817) BP: (107-135)/(63-84) 135/80 (12/28 0817) SpO2:  [93 %-96 %] 95 % (12/28 0817)  General - Well nourished, well developed, lethargic. Surgical wound from TCAR procedure appears to be healing well without any bleeding from the left neck.   Ophthalmologic - fundi not visualized due to noncooperation.  Cardiovascular - Regular rhythm and rate.  Neuro - lethargic, open eyes on voice but close if no constant voice or tactile stimulation. Following all simple commands, but nonverbal, not able to name or repeat. No gaze palsy, tracking bilaterally, PERRL, blinking to visual threat bilaterally. Right lower facial weakness. Tongue midline. Right hemi-plegia with right upper extremity 0/5 strength and on the right right lower extremity 2-/5 proximal and 4+/5 distal DF/PF. Good strength on the left. Sensation subjectively symmetrical. FTN slow on the left but intact. Gait not tested  ASSESSMENT/PLAN Mr. John Lambert is a 79  y.o. male with history of L hemispheric infarcts secondary to symptomatic 80% left proximal carotid stenosis with a soft plaque on long-term AC for blood clotting disorder who underwent L carotid TCAR 04/27/2020. In PACU Post op developed aphasia w/ L gaze and R hemiparesis. Taken for emergent cerebral angio after CT, CTA neg for occlusion.   Stroke:scattered LACA andMCA/ACAinfarcts likely severe L ICA stenosis  Stroke extension: followed by left TCAR with new embolic left MCA infarcts  MRI admission  Numerous scattered punctate L posterior frontal infarct w/ microembolic infarcts ACA/MCA territory and Punctate infarct corpus callosum. Old L parietal cortical and subcortical infarcts.  CTA head & neck ulcerated L ICA bifurcation w/ severe proximal 80% stenosis. Moderate intradural L VA stenosis. RUL w/ ground glass appearance.   MRI 12/23 (post TCAR) new scattered L basal ganglia, L  frontoparietal cortical and subcortical infarcts. Subacute L medial frontoparietal infarct.   Carotid Doppler  L ICA 80-99%  2D Echo w/ bubble EF 60-65%. No source of embolus   EEG neg sz  LDL 84  HgbA1c 5.3  VTE prophylaxis - warfarin   warfarin daily prior to admission, now on coumadin. Continue aspirin, plavix with warfarin x 1 mo -> aspirin + coumadin per Dr. Myra Gianotti  Therapy recommendations:  CIR->reassessment->SNF  Disposition:  pending   follow up with stroke clinic NP at [facility] 4 weeks following rehab discharge  Symptomatic L ICA Stenosis   CTA head & neck ulcerated L ICA bifurcation w/ severe proximal 80% stenosis.  Carotid Doppler  L ICA 80-99%  TCAR 12/22/2021with [facility]  Post op neuro worsening  CT, CTA, Cerebral angio all neg for occlusion. MRI shows new L brain scattered small infarcts  Now on ASA, plavix and anticoagulation triple therapy for one month.   Blood Clotting D/O Hx DVT, PE  Home anticoagulation:  warfarin daily, continued in the hospital  Admission INR 2.2  LE doppler partial non-obstructed age indeterminate DVT R common femoral and R popliteal vein, likely chronic   Post TCAR, on lovenox bridge->change to IV heparin d/t new strokes and new AF  INR 1.3->1.9->2.5  Long-term AC w/ warfarin - family declined DOAC at this time  Paroxysmal Atrial Fibrillation, new dx  Home anticoagulation:  warfarin daily for clotting d/o, hx PE, DVT  CHA2DS2-VASc Score = 5, ?2 oral anticoagulation recommended  Age in Years:  ?72   +2    Sex:  Male   0    Hypertension History:   +1     Diabetes Mellitus:  0  Congestive Heart Failure History:  0  Vascular Disease History:  0     Stroke/TIA/Thromboembolism History:  yes   +2 . On coumadin now. Continue aspirin, plavix with warfarin x 1 mo  -> aspirin + Warfarin. . INR 1.3->1.9->2.5   AKI Urinary retention UTI . Cre 0.76->1.3->1.4  . Na 137->145->144 . UA WBC 21-50, RBC > 50 . Renal US - large  retention with debris  . On IVF with 1/2NS @ 100 and TF @ 75  Gross hematuria Anemia due to blood loss  Urine bloody, UA RBC > 50  Hb 16.2->13.8->12.5->11.5->10.4 . Coumadin on hold for now . Management per primary team  Hypertension  Off Cleviprex gtt   On coreg bid  Long term goal normotensive  Hyperlipidemia  Home meds:  pravachol 20  Now on pravachol 40  LDL 84, goal < 70  Continue statin at discharge  Dysphagia . Secondary to stroke . NPO . cortrak placed 12/24 (  pt removed later that day) -> replaced 12/27 . On TF @ 64 . Speech on board   Other Stroke Risk Factors  Advanced Age >/= 65   Former Cigarette smoker  ETOH use, advised to drink no more than 2 drink(s) a day  Other Active Problems  Mild leukocytosis - WBC's 11.3->10.5->8.4->9.1 - resolved  Hypokalemia 3.4 -> 4.1,  Mag normal  Hospital day # 12  Neurology will sign off. Please call with questions. Pt will follow up with stroke clinic NP at [facility] in about 4 weeks. Thanks for the consult.    Rosalin Hawking, MD PhD Stroke Neurology 05/03/2020 11:06 AM   To contact Stroke Continuity provider, please refer to [email or web address] After hours, contact General Neurology

## 2020-05-03 NOTE — Progress Notes (Signed)
PROGRESS NOTE    John Lambert  [id number] DOB: 08-10-1940 DOA: 04/19/2020 PCP: Harlan Stains, MD  Brief Narrative:  The patient is an 79 y.o.malewith PMH significant for hypertension, melanoma, neurofibroma, clotting disorder, history of DVT and PE on chronic Coumadinwhopresented to the ED on 12/14 with c/o : bilateral lower extremity weakness and difficulty ambulating associated with pain in the left hip region.(has a history of neurofibroma tumor removal from his spine about 4 years ago and melanoma removal from right calf about 30 years ago.) MRI brain obtainednoted numerousscattered punctate acute infarctions in the left posterior frontal region consistent with micro embolic infarctions which could either be in the anterior or middle cerebral arteryterritory. Punctate acute infarction also affecting the splenium ofthe corpus callosum.Extensive chronic small-vessel ischemic changes elsewherethroughout the brain.Small old left parietal vertex cortical and subcorticalInfarctions.  MRI lumbar spine showedchronicarachnoiditis with adhesions in the thecal sac and distorted cauda equina nerve roots from L2-L3 to the sacrum, but no evidence ofrecurrent spinal tumor.Possible right L5/S1 radiculitis after noting L4/5 multifactorial mild to moderate spinal stenosis. Vascular surgery was consulted and took patient on 12/22 for a left transcarotid artery stenting. Patient found to have 80% carotid stenosis and then following procedure down to less than 10% with stent. However, following surgery patient noted to have difficulty moving right side expressive aphasia and left gaze deviation and code stroke immediately called. Patient taken straight to CT scan. No new acute infarction noted. Patient underwent emergent left carotid diagnostic cerebral catheter angiogram with left MCA embolism noted from previous, but no acute findings. No intervention planned. Patient sent to medical ICU in  the absence of beds in the neuro ICU.EEG noted cortical dysfunction left hemisphere, but no seizures or epileptiform discharges seen throughout recording.  He was transferred to the stroke service on 04/27/2020 and then transferred out to the [facility] service on 04/29/2020.  Currently he continues to remain weak and his right upper arm is flaccid.  Now having gross hematuria as he has been placed on aspirin Plavix as well as heparin and Coumadin.  Heparin drip has now been stopped given that his Coumadin level therapeutic.  Next Coumadin will be held given his gross hematuria.  Renal function continues to worsen slightly.  Will need to continue monitor continue IV fluid hydration.  Will discuss with nephrology if renal function continues to worsen.  Case was discussed with urology Dr. Alexis Frock who recommends obtaining baseline imaging with a CT of the abdomen pelvis with and without contrast for hematuria.  Dr. [name] does not recommend placing a Foley catheter at this point as it would likely worsen his hematuria.  Assessment & Plan:   Principal Problem:   Weakness Active Problems:   Anticoagulated on warfarin   Impaired ambulation   Fall at home, initial encounter   Essential hypertension   Blood clotting disorder (HCC)   Cerebral embolism with cerebral infarction   Stroke [facility])   Middle cerebral artery embolism, left  Acute CVA on presentation from left carotid artery stenosis status post left carotid stenting with complications with  new stroke.   -He underwent elective left carotid transcranial procedure with stenting complicated by postop stroke affecting his speech he is completely aphasic and now with dense right hemiplegia. -He had an emergent CT angiogram and cerebral catheter angiogram which did not reveal large vessel occlusion. -EEG did not show any epileptiform activities. -MRI of the brain showed patchy left basal ganglia as well as left frontoparietal cortical and subcortical  infarcts which are new in addition to the previous recent left MCA frontal infarct which was subacute. -Seen by speech therapy modified barium swallow consistent with silent aspiration. -Core track tube was placed which he pulled it out. -Core track tube reinserted 05/02/2020 by dietary.  Tube feeds restarted 05/02/2020. -PT OT recommended CIR but now he is too weak to participate and recommendation has been changed to SNF. -Hemoglobin A1c is 5.3, LDL is 84,  -Echo shows normal ejection fraction with no source of emboli. -Carotid Doppler with left ICA 80 to 99%. -He is currently on heparin and Coumadin till INR is therapeutic.  Front of his stop now that his INR is 2.5 and his Coumadin has been held given his gross hematuria -Patient is on aspirin Plavix heparin and Coumadin and vascular surgery has recommended triple therapy for 1 month and then going on Coumadin and aspirin alone  -Neurology recommends to continue aspirin and Plavix for 30 days for fresh carotid stent then aspirin alone.  However will need to continue Coumadin for A. fib.  -We will consult palliative for goals of care discussion given his worsening hematuria -He is now having gross hematuria so we will need to watch carefully and Urology recommending CT Abd/Pelvis w/wo Contrast  Left carotid artery stenosis vascular surgery status post carotid artery stenting  -Vascular was following and signed off  History of clotting disorder including PE DVT  -Was on Coumadin prior to admission.   -Doppler of the lower extremity shows chronic DVT.  Continued heparin and Coumadin.  INR now therapeutic. Pharmacy following INR and is now 2.5  Hyperlipidemia  -LDL was 84  -Continue Pravastatin 40 mg daily. -Continue the same dose upon discharge.  Paroxysmal A. fib  -CHA2DS2-VASc score above 5 continue Coumadin on discharge.   -INR slowly trending up to 2.5.  Pharmacy adjusting the dose.   Essential HTN -Currently on Carvedilol  twice a day.  Off of Cleviprex drip.  Dysphagia and nutrition  -Patient pulled out a Cortrak tube 04/2420 night.   -Patient had MBS done on 04/29/2020 with findings consistent with silent aspiration. -Cortrak tube was placed again on 05/02/2020. -Continue with nutrition  AKI on CKD stage II -His creatinine did trend up and initially they thought was prerenal but could be obstructive -He was started on half-normal saline 100 cc an hour for 2 day -Renal work-up showing that his urinalysis was just pure blood and that his urine had many bacteria, greater than 50 RBCs, 21-50 WBCs with no squamous epithelial cells.  Urine osmolality was 577, urine creatinine was 36.69, urine sodium level 45 -Urine cultures pending -Renal Ultrasound showed "Bladder is distended and contains debris. Bladder infection could present this fashion. Prevoid volume 951.9 CC. Mild prominence right renal pelvis noted. 2. Increased echogenicity both kidneys consistent with chronic medical renal disease." -I spoke with with Dr. Berneice Heinrich of Urology who recommended a CT of the abdomen and pelvis with and without contrast for baseline imaging given his hematuria and was concerned that his bladder prevoid volume was 951. -Currently continuing his aspirin and Plavix but holding his Coumadin and heparin drip -May need nephrology input and may need further evaluation in the morning and will follow up on CT abdomen pelvis and discussion with urology again in the morning   Gross Hematuria -See above -Spoke with urology who recommends holding off Foley catheter now and obtain a CT of the abdomen pelvis for further baseline imaging and then review  Hypokalemia -Improved -Patient potassium  now 4.1 -Continue monitor if it is necessary -Repeat CMP in a.m.  DVT prophylaxis: Was anticoagulated with heparin drip and placed on Coumadin which has now been held Code Status: FULL CODE Family Communication: Spoke to wife and Daughter at  bedside  Disposition Plan: Pending further clinical improvement and clearance by neurology; will need SNF level of care and she is medically stable  Status is: Inpatient  Remains inpatient appropriate because:Unsafe d/c plan, IV treatments appropriate due to intensity of illness or inability to take PO and Inpatient level of care appropriate due to severity of illness   Dispo: The patient is from: Home              Anticipated d/c is to: SNF              Anticipated d/c date is: 3 days              Patient currently is not medically stable to d/c.  Consultants:   Vascular Surgery  Neurology  Palliative Care Medicine   Procedures:  Status post left transcarotid artery stenting for symptomatic disease complicated by stroke  Antimicrobials:  Anti-infectives (From admission, onward)   Start     Dose/Rate Route Frequency Ordered Stop   04/27/20 0600  ceFAZolin (ANCEF) IVPB 2g/100 mL premix  Status:  Discontinued       Note to Pharmacy: Send with pt to OR   2 g 200 mL/hr over 30 Minutes Intravenous On call 04/26/20 0805 04/26/20 2018   04/27/20 0600  ceFAZolin (ANCEF) IVPB 2g/100 mL premix        2 g 200 mL/hr over 30 Minutes Intravenous 30 min pre-op 04/26/20 2013 04/27/20 0804   04/27/20 0000  ceFAZolin (ANCEF) IVPB 1 g/50 mL premix  Status:  Discontinued       Note to Pharmacy: Send with pt to OR   1 g 100 mL/hr over 30 Minutes Intravenous On call 04/26/20 0800 04/26/20 0805        Subjective: Seen and examined at bedside and the patient was not complaining of any pain but still not able to move his right upper extremity.  His canister at bedside shows dark brown urine and it was tested is all blood.  Renal function continues to climb a little bit.  We will further work this up.  I spoke with urology who recommends obtaining a CT of the abdomen pelvis for baseline.  No nausea or vomiting.  No other concerns or plans at this time.  His Coumadin and his heparin will be held  given his gross hematuria.  Objective: Vitals:   05/03/20 0012 05/03/20 0428 05/03/20 0817 05/03/20 1521  BP: 129/79 122/63 135/80 116/65  Pulse: 97 98 90 82  Resp: 20 20 18 18   Temp: 98.5 F (36.9 C) 98.4 F (36.9 C) 99.9 F (37.7 C) 98.4 F (36.9 C)  TempSrc: Oral Oral Oral Oral  SpO2: 96% 93% 95% 96%  Weight:      Height:        Intake/Output Summary (Last 24 hours) at 05/03/2020 [id number] Last data filed at 05/03/2020 1555 Gross per 24 hour  Intake 2689.84 ml  Output --  Net 2689.84 ml   Filed Weights   04/20/20 2034 04/30/20 0500 05/01/20 0453  Weight: 79.7 kg 87.2 kg 87.1 kg   Examination: Physical Exam:  Constitutional: Ill-appearing Caucasian male currently in NAD and appears uncomfortable slightly Eyes: Lids and conjunctivae normal, sclerae anicteric  ENMT: External Ears, Nose appear  normal. Grossly normal hearing.   Neck: Appears normal, supple, no cervical masses, normal ROM, no appreciable thyromegaly: No JVD diminished Respiratory: Clear to auscultation bilaterally, no wheezing, rales, rhonchi or crackles. Normal respiratory effort and patient is not tachypenic. No accessory muscle use.  Unlabored breathing  Cardiovascular: RRR, no murmurs / rubs / gallops. S1 and S2 auscultated.  Abdomen: Soft, mildly-tender, slightly-distended. Bowel sounds positive.  GU: Deferred.  Has a condom catheter on goes to the Foley canister on the wall with dark brown color urine Musculoskeletal: No clubbing / cyanosis of digits/nails. No joint deformity upper and lower extremities.  Skin: No rashes, lesions, ulcers on limited skin evaluation. No induration; Warm and dry.  Neurologic: CN 2-12 grossly intact with no focal deficits. Romberg sign and cerebellar reflexes not assessed.  Psychiatric: Normal judgment and insight. Alert and oriented x 3. Normal mood and appropriate affect.   Data Reviewed: I have personally reviewed following labs and imaging studies  CBC: Recent Labs   Lab 04/29/20 0301 04/30/20 0525 05/01/20 0102 05/02/20 0242 05/03/20 0430  WBC 11.0* 11.3* 10.5 8.4 9.1  HGB 13.8 12.8* 12.5* 11.5* 10.4*  HCT 40.9 39.5 37.4* 34.7* 32.2*  MCV 90.5 91.4 91.4 92.0 92.8  PLT 192 200 223 240 0000000   Basic Metabolic Panel: Recent Labs  Lab 04/27/20 0253 04/28/20 1600 04/29/20 0301 04/30/20 0525 05/02/20 0741 05/03/20 0430 05/03/20 1310  NA 141 139 137 137 145 144 143  K 4.0 3.9 4.0 3.7 3.4* 3.5 4.1  CL 105 107 106 108 115* 117* 115*  CO2 26 22 22 23 22  21* 20*  GLUCOSE 98 100* 121* 141* 132* 136* 154*  BUN 13 16 17 16  29* 38* 44*  CREATININE 0.91 0.77 0.77 0.76 1.30* 1.40* 1.50*  CALCIUM 8.7* 8.2* 8.3* 8.0* 8.2* 7.8* 7.9*  MG 2.1 1.9  --   --  2.1  --   --   PHOS 3.1  --   --   --   --   --   --    GFR: Estimated Creatinine Clearance: 44.9 mL/min (A) (by C-G formula based on SCr of 1.5 mg/dL (H)). Liver Function Tests: Recent Labs  Lab 04/26/20 2108 04/30/20 0525 05/02/20 0741 05/03/20 0430  AST 30 27 27  34  ALT 28 29 29  35  ALKPHOS 52 39 34* 30*  BILITOT 0.8 1.3* 1.1 0.6  PROT 6.3* 5.4* 4.9* 4.6*  ALBUMIN 3.5 2.8* 2.5* 2.4*   No results for input(s): LIPASE, AMYLASE in the last 168 hours. No results for input(s): AMMONIA in the last 168 hours. Coagulation Profile: Recent Labs  Lab 04/29/20 0301 04/30/20 0525 05/01/20 0102 05/02/20 0242 05/03/20 0430  INR 1.3* 1.2 1.3* 1.9* 2.5*   Cardiac Enzymes: Recent Labs  Lab 05/03/20 0430  CKTOTAL 124   BNP (last 3 results) No results for input(s): PROBNP in the last 8760 hours. HbA1C: No results for input(s): HGBA1C in the last 72 hours. CBG: Recent Labs  Lab 05/02/20 1957 05/03/20 0016 05/03/20 0424 05/03/20 0812 05/03/20 1516  GLUCAP 123* 109* 117* 128* 109*   Lipid Profile: No results for input(s): CHOL, HDL, LDLCALC, TRIG, CHOLHDL, LDLDIRECT in the last 72 hours. Thyroid Function Tests: No results for input(s): TSH, T4TOTAL, FREET4, T3FREE, THYROIDAB in the  last 72 hours. Anemia Panel: No results for input(s): VITAMINB12, FOLATE, FERRITIN, TIBC, IRON, RETICCTPCT in the last 72 hours. Sepsis Labs: No results for input(s): PROCALCITON, LATICACIDVEN in the last 168 hours.  Recent Results (from the  past 240 hour(s))  MRSA PCR Screening     Status: None   Collection Time: 04/27/20  3:39 PM   Specimen: Nasopharyngeal  Result Value Ref Range Status   MRSA by PCR NEGATIVE NEGATIVE Final    Comment:        The GeneXpert MRSA Assay (FDA approved for NASAL specimens only), is one component of a comprehensive MRSA colonization surveillance program. It is not intended to diagnose MRSA infection nor to guide or monitor treatment for MRSA infections. Performed at [facility], [place] [address], [place], [place] [address]     RN Pressure Injury Documentation:     Estimated body mass index is 24.65 kg/m as calculated from the following:   Height as of this encounter: 6\' 2"  (1.88 m).   Weight as of this encounter: 87.1 kg.  Malnutrition Type:  Nutrition Problem: Inadequate oral intake Etiology: dysphagia   Malnutrition Characteristics:  Signs/Symptoms: NPO status   Nutrition Interventions:  Interventions: Tube feeding   Radiology Studies: US RENAL  Result Date: 05/03/2020 CLINICAL DATA:  Acute renal injury. EXAM: RENAL / URINARY TRACT ULTRASOUND COMPLETE COMPARISON:  No recent. FINDINGS: Right Kidney: Renal measurements: 11.8 x 5.4 x 5.4 cm = volume: 178.8 mL. Increased echogenicity. No mass. Mild prominence of the right renal pelvis noted. Left Kidney: Renal measurements: 12.5 x 6.6 x 5.9 cm = volume: 252.8 mL. Increased echogenicity. No mass or hydronephrosis visualized. Bladder: Bladder is distended and contains debris. Bladder infection could present this fashion. Bilateral ureteral jets visualized. Prevoid volume 951.9 cc. Other: None. IMPRESSION: 1. Bladder is distended and contains debris. Bladder infection could present  this fashion. Prevoid volume 951.9 CC. Mild prominence right renal pelvis noted. 2. Increased echogenicity both kidneys consistent with chronic medical renal disease. Electronically Signed   By: [place]   On: 05/03/2020 12:30   Scheduled Meds: . aspirin  81 mg Oral Daily  . carvedilol  25 mg Oral BID WC  . Chlorhexidine Gluconate Cloth  6 each Topical Daily  . clopidogrel  75 mg Oral Daily  . feeding supplement (PROSource TF)  45 mL Per Tube Daily  . pravastatin  40 mg Oral Daily  . Warfarin - Pharmacist Dosing Inpatient   Does not apply q1600   Continuous Infusions: . sodium chloride 100 mL/hr at 05/03/20 1525  . feeding supplement (JEVITY 1.2 CAL) 1,000 mL (05/02/20 1108)    LOS: 12 days   Kerney Elbe, DO [facility] PAGER is on [place]  If 7PM-7AM, please contact night-coverage [email or web address]

## 2020-05-04 ENCOUNTER — Inpatient Hospital Stay (HOSPITAL_COMMUNITY): Payer: Medicare Other

## 2020-05-04 DIAGNOSIS — R531 Weakness: Secondary | ICD-10-CM | POA: Diagnosis not present

## 2020-05-04 DIAGNOSIS — Z7189 Other specified counseling: Secondary | ICD-10-CM | POA: Diagnosis not present

## 2020-05-04 DIAGNOSIS — D689 Coagulation defect, unspecified: Secondary | ICD-10-CM | POA: Diagnosis not present

## 2020-05-04 DIAGNOSIS — I10 Essential (primary) hypertension: Secondary | ICD-10-CM | POA: Diagnosis not present

## 2020-05-04 DIAGNOSIS — Z515 Encounter for palliative care: Secondary | ICD-10-CM | POA: Diagnosis not present

## 2020-05-04 DIAGNOSIS — Z7901 Long term (current) use of anticoagulants: Secondary | ICD-10-CM | POA: Diagnosis not present

## 2020-05-04 LAB — CBC WITH DIFFERENTIAL/PLATELET
Abs Immature Granulocytes: 0.06 10*3/uL (ref 0.00–0.07)
Abs Immature Granulocytes: 0.16 10*3/uL — ABNORMAL HIGH (ref 0.00–0.07)
Basophils Absolute: 0 10*3/uL (ref 0.0–0.1)
Basophils Absolute: 0 10*3/uL (ref 0.0–0.1)
Basophils Relative: 0 %
Basophils Relative: 0 %
Eosinophils Absolute: 0.1 10*3/uL (ref 0.0–0.5)
Eosinophils Absolute: 0 10*3/uL (ref 0.0–0.5)
Eosinophils Relative: 1 %
Eosinophils Relative: 0 %
HCT: 31.2 % — ABNORMAL LOW (ref 39.0–52.0)
HCT: 34.2 % — ABNORMAL LOW (ref 39.0–52.0)
Hemoglobin: 10.2 g/dL — ABNORMAL LOW (ref 13.0–17.0)
Hemoglobin: 10.8 g/dL — ABNORMAL LOW (ref 13.0–17.0)
Immature Granulocytes: 1 %
Immature Granulocytes: 1 %
Lymphocytes Relative: 5 %
Lymphocytes Relative: 4 %
Lymphs Abs: 0.6 10*3/uL — ABNORMAL LOW (ref 0.7–4.0)
Lymphs Abs: 0.6 10*3/uL — ABNORMAL LOW (ref 0.7–4.0)
MCH: 30.3 pg (ref 26.0–34.0)
MCH: 30 pg (ref 26.0–34.0)
MCHC: 32.7 g/dL (ref 30.0–36.0)
MCHC: 31.6 g/dL (ref 30.0–36.0)
MCV: 92.6 fL (ref 80.0–100.0)
MCV: 95 fL (ref 80.0–100.0)
Monocytes Absolute: 0.9 10*3/uL (ref 0.1–1.0)
Monocytes Absolute: 1.1 10*3/uL — ABNORMAL HIGH (ref 0.1–1.0)
Monocytes Relative: 8 %
Monocytes Relative: 7 %
Neutro Abs: 10.6 10*3/uL — ABNORMAL HIGH (ref 1.7–7.7)
Neutro Abs: 13.5 10*3/uL — ABNORMAL HIGH (ref 1.7–7.7)
Neutrophils Relative %: 85 %
Neutrophils Relative %: 88 %
Platelets: 256 10*3/uL (ref 150–400)
Platelets: 271 10*3/uL (ref 150–400)
RBC: 3.37 MIL/uL — ABNORMAL LOW (ref 4.22–5.81)
RBC: 3.6 MIL/uL — ABNORMAL LOW (ref 4.22–5.81)
RDW: 13.5 % (ref 11.5–15.5)
RDW: 13.7 % (ref 11.5–15.5)
WBC: 12.3 10*3/uL — ABNORMAL HIGH (ref 4.0–10.5)
WBC: 15.4 10*3/uL — ABNORMAL HIGH (ref 4.0–10.5)
nRBC: 0 % (ref 0.0–0.2)
nRBC: 0 % (ref 0.0–0.2)

## 2020-05-04 LAB — URINALYSIS, ROUTINE W REFLEX MICROSCOPIC
Bilirubin Urine: NEGATIVE
Glucose, UA: NEGATIVE mg/dL
Ketones, ur: NEGATIVE mg/dL
Nitrite: NEGATIVE
Protein, ur: 100 mg/dL — AB
Specific Gravity, Urine: 1.015 (ref 1.005–1.030)
pH: 5 (ref 5.0–8.0)

## 2020-05-04 LAB — COMPREHENSIVE METABOLIC PANEL
ALT: 32 U/L (ref 0–44)
ALT: 30 U/L (ref 0–44)
AST: 23 U/L (ref 15–41)
AST: 19 U/L (ref 15–41)
Albumin: 2.3 g/dL — ABNORMAL LOW (ref 3.5–5.0)
Albumin: 2.4 g/dL — ABNORMAL LOW (ref 3.5–5.0)
Alkaline Phosphatase: 30 U/L — ABNORMAL LOW (ref 38–126)
Alkaline Phosphatase: 29 U/L — ABNORMAL LOW (ref 38–126)
Anion gap: 7 (ref 5–15)
Anion gap: 9 (ref 5–15)
BUN: 53 mg/dL — ABNORMAL HIGH (ref 8–23)
BUN: 55 mg/dL — ABNORMAL HIGH (ref 8–23)
CO2: 20 mmol/L — ABNORMAL LOW (ref 22–32)
CO2: 21 mmol/L — ABNORMAL LOW (ref 22–32)
Calcium: 7.5 mg/dL — ABNORMAL LOW (ref 8.9–10.3)
Calcium: 8 mg/dL — ABNORMAL LOW (ref 8.9–10.3)
Chloride: 113 mmol/L — ABNORMAL HIGH (ref 98–111)
Chloride: 114 mmol/L — ABNORMAL HIGH (ref 98–111)
Creatinine, Ser: 1.79 mg/dL — ABNORMAL HIGH (ref 0.61–1.24)
Creatinine, Ser: 2.12 mg/dL — ABNORMAL HIGH (ref 0.61–1.24)
GFR, Estimated: 38 mL/min — ABNORMAL LOW (ref >60)
GFR, Estimated: 31 mL/min — ABNORMAL LOW (ref >60)
Glucose, Bld: 173 mg/dL — ABNORMAL HIGH (ref 70–99)
Glucose, Bld: 131 mg/dL — ABNORMAL HIGH (ref 70–99)
Potassium: 4.2 mmol/L (ref 3.5–5.1)
Potassium: 4.4 mmol/L (ref 3.5–5.1)
Sodium: 140 mmol/L (ref 135–145)
Sodium: 144 mmol/L (ref 135–145)
Total Bilirubin: 1 mg/dL (ref 0.3–1.2)
Total Bilirubin: 1.1 mg/dL (ref 0.3–1.2)
Total Protein: 4.8 g/dL — ABNORMAL LOW (ref 6.5–8.1)
Total Protein: 5.4 g/dL — ABNORMAL LOW (ref 6.5–8.1)

## 2020-05-04 LAB — GLUCOSE, CAPILLARY
Glucose-Capillary: 151 mg/dL — ABNORMAL HIGH (ref 70–99)
Glucose-Capillary: 132 mg/dL — ABNORMAL HIGH (ref 70–99)
Glucose-Capillary: 112 mg/dL — ABNORMAL HIGH (ref 70–99)
Glucose-Capillary: 131 mg/dL — ABNORMAL HIGH (ref 70–99)
Glucose-Capillary: 141 mg/dL — ABNORMAL HIGH (ref 70–99)
Glucose-Capillary: 146 mg/dL — ABNORMAL HIGH (ref 70–99)

## 2020-05-04 LAB — URINALYSIS, MICROSCOPIC (REFLEX): Squamous Epithelial / HPF: NONE SEEN (ref 0–5)

## 2020-05-04 LAB — PHOSPHORUS
Phosphorus: 2.9 mg/dL (ref 2.5–4.6)
Phosphorus: 3.8 mg/dL (ref 2.5–4.6)

## 2020-05-04 LAB — MAGNESIUM
Magnesium: 1.8 mg/dL (ref 1.7–2.4)
Magnesium: 1.9 mg/dL (ref 1.7–2.4)

## 2020-05-04 LAB — LIPASE, BLOOD: Lipase: 49 U/L (ref 11–51)

## 2020-05-04 LAB — PROTIME-INR
INR: 1.8 — ABNORMAL HIGH (ref 0.8–1.2)
Prothrombin Time: 20.6 seconds — ABNORMAL HIGH (ref 11.4–15.2)

## 2020-05-04 IMAGING — DX DG CHEST 1V PORT
1 series · 1 of 1 positions shown · non-contrast
Comparison: Chest x-ray dated [DATE]

CLINICAL DATA: Shortness of breath

EXAM:
PORTABLE CHEST 1 VIEW

[chest ap]
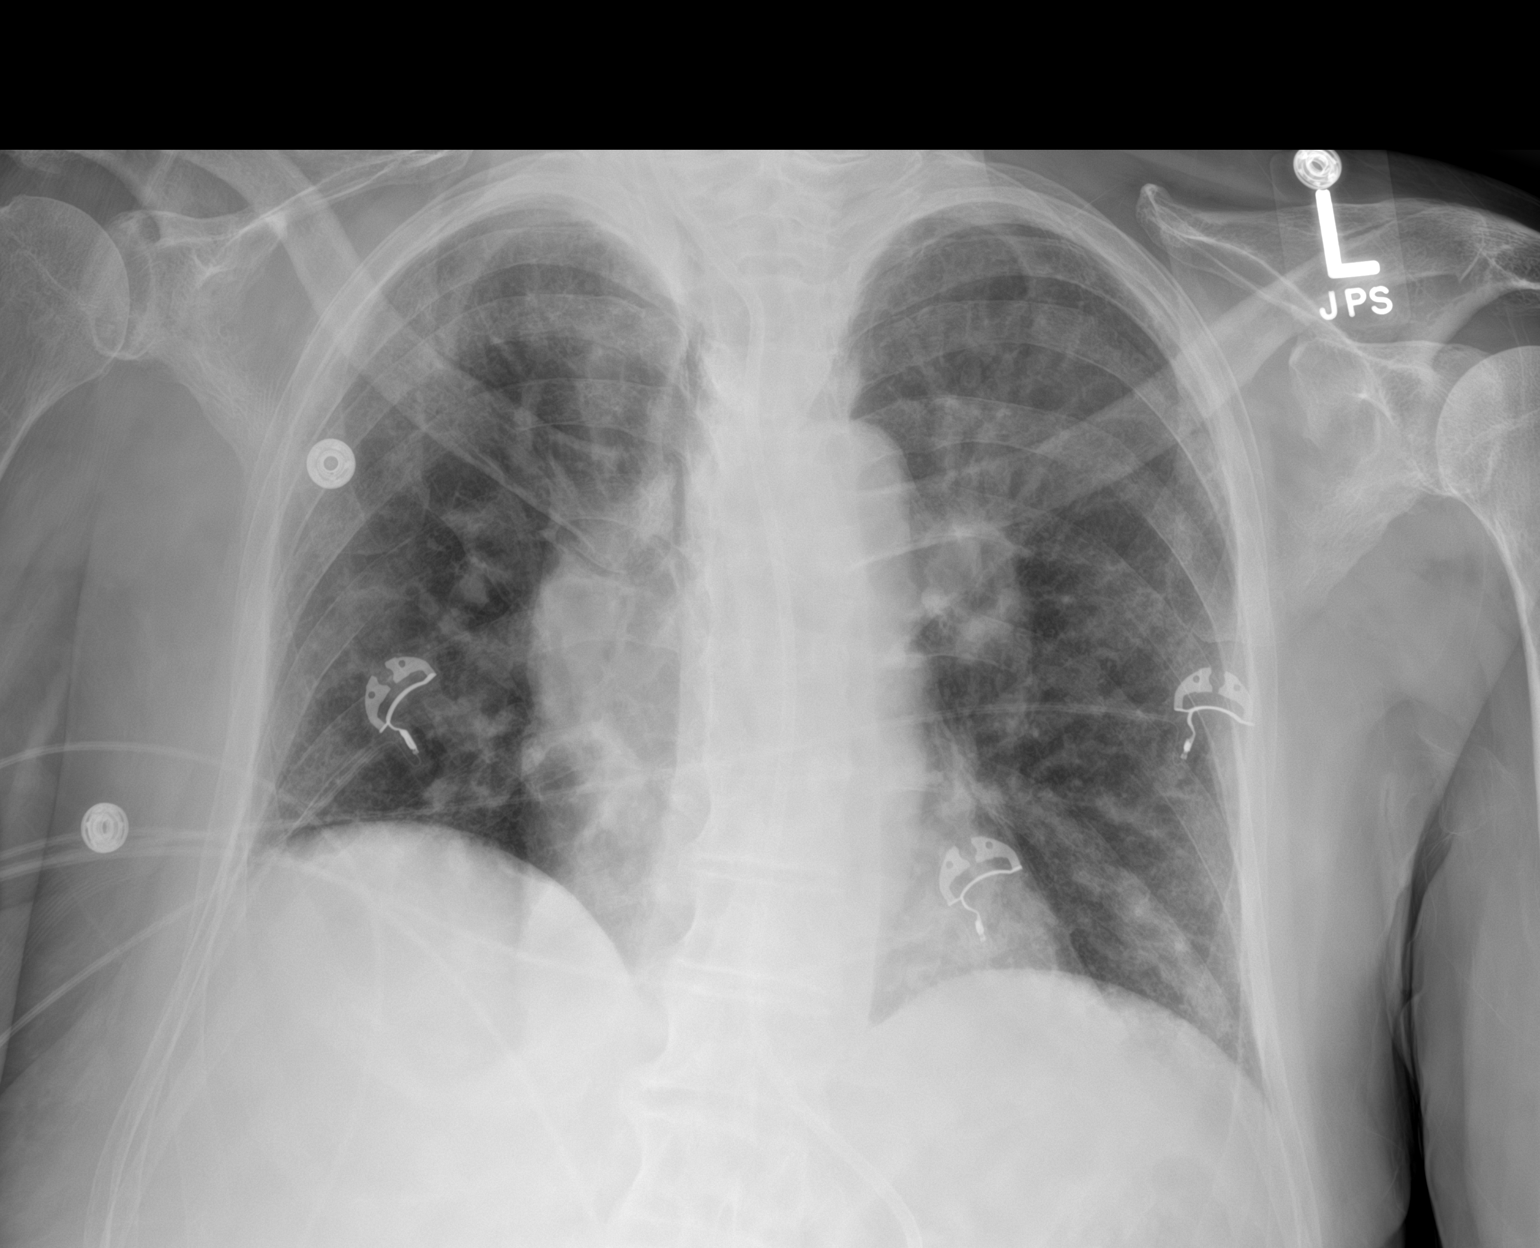

[1 of 1 positions shown; findings below may reference images not displayed]

FINDINGS: Heart size is normal. Masslike density at the RIGHT hilum. Diffuse
interstitial prominence, presumably edema. Additional bilateral
perihilar and basilar opacities. No pleural effusion or pneumothorax
is seen. Osseous structures about the chest are unremarkable.
Enteric tube passes below the diaphragm.
IMPRESSION: 1. Masslike density at the RIGHT hilum, suspicious for neoplastic
mass or lymphadenopathy, less likely perihilar pneumonia. Recommend
chest CT for further characterization.
2. Diffuse bilateral interstitial prominence, presumably some degree
of edema/volume overload.
3. Additional patchy opacities within the bilateral perihilar and
basilar regions, pneumonia versus pulmonary edema, alternatively
pleural plaques related to previous asbestos exposure.
4. Enteric tube passes below the diaphragm.

## 2020-05-04 IMAGING — CT CT CHEST W/O CM
2 of 4 series · 12 of 36 positions shown, 15 images · non-contrast
Comparison: None.

CLINICAL DATA: Shortness of breath. Evaluate for pneumonia.
Hematuria.

EXAM:
CT CHEST, ABDOMEN AND PELVIS WITHOUT CONTRAST
TECHNIQUE: Multidetector CT imaging of the chest, abdomen and pelvis was
performed following the standard protocol without IV contrast.

[Series 3: cap wo 5.0 i31f 2 · axial · 0.90mm/px · z∈[+991,+1546]mm · 9 of 135 slices shown, 12 images]
[im 12/135  mediastinal]
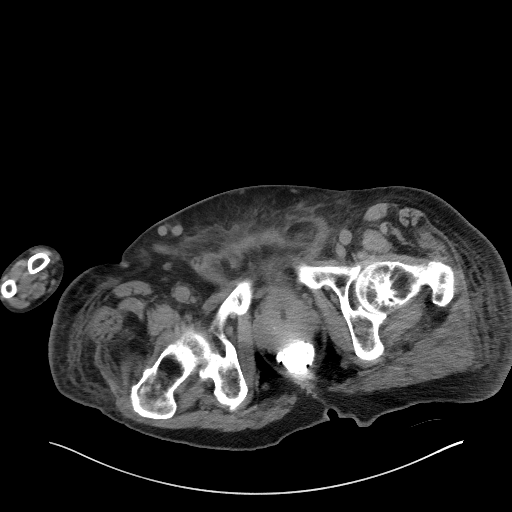
[im 12/135  lung]
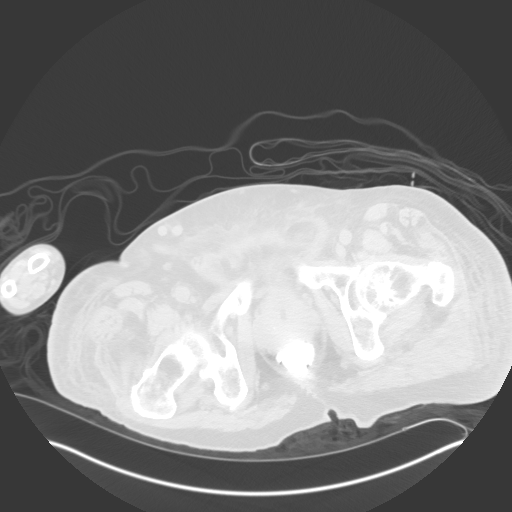
[im 23/135  lung]
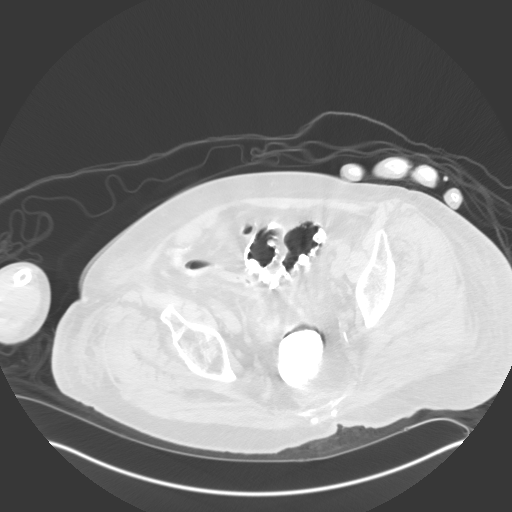
[im 45/135  lung]
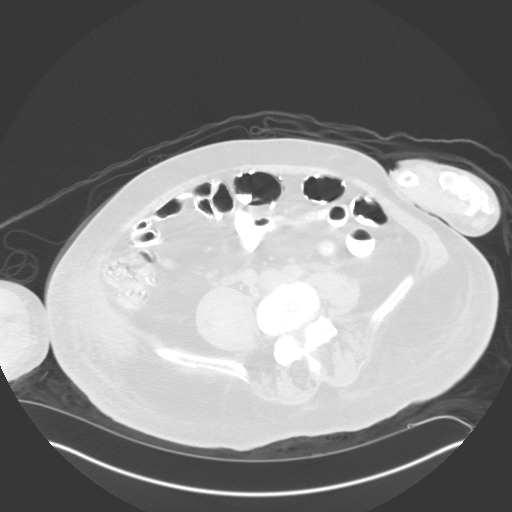
[im 56/135  lung]
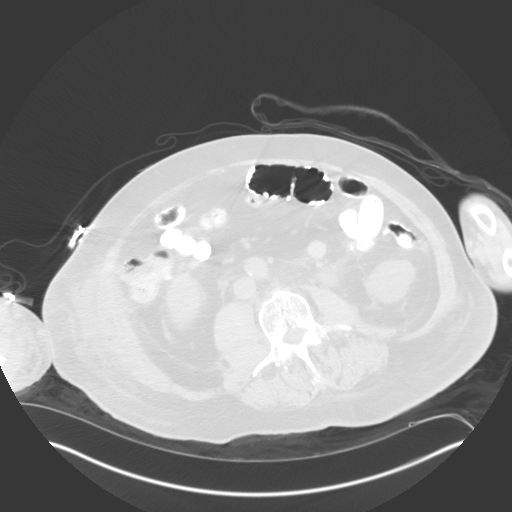
[im 68/135  mediastinal]
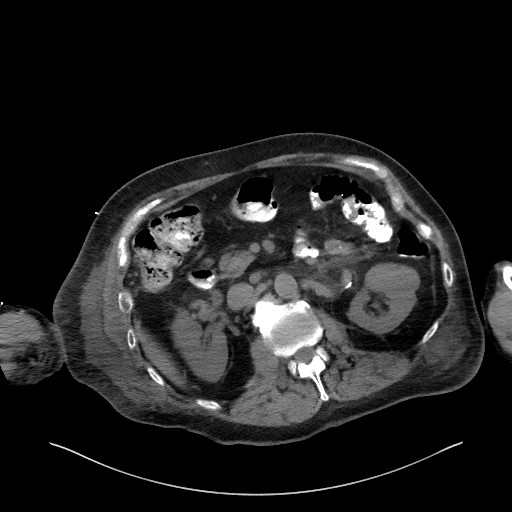
[im 68/135  lung]
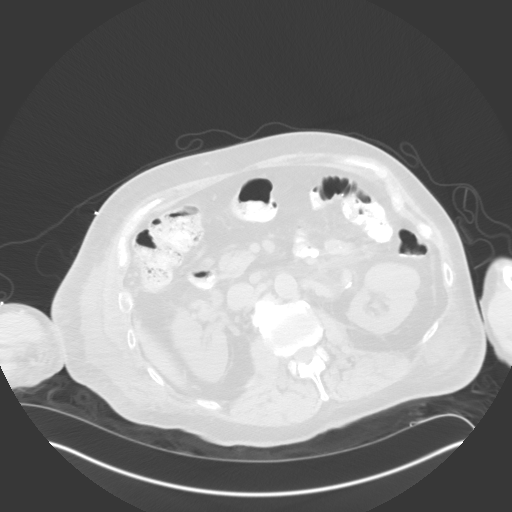
[im 79/135  lung]
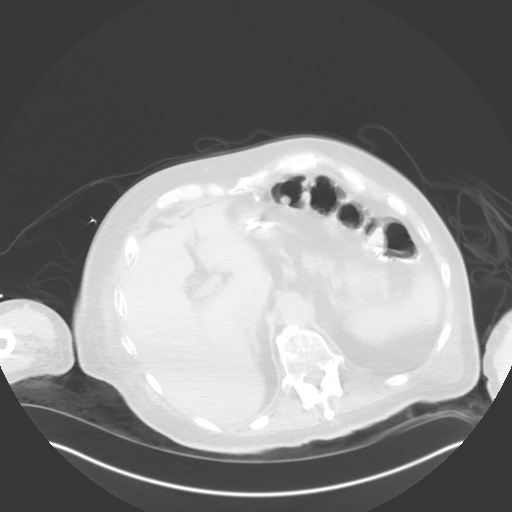
[im 90/135  lung]
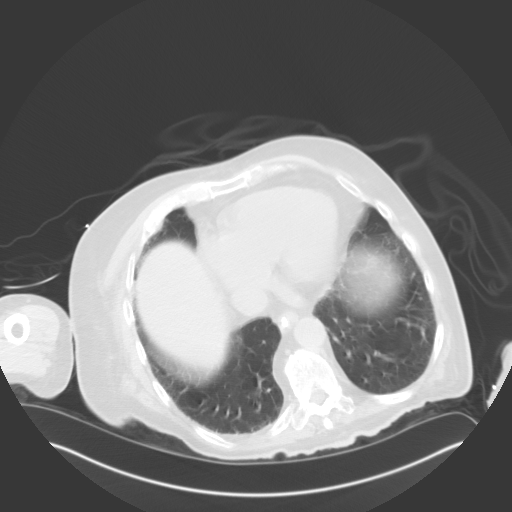
[im 112/135  lung]
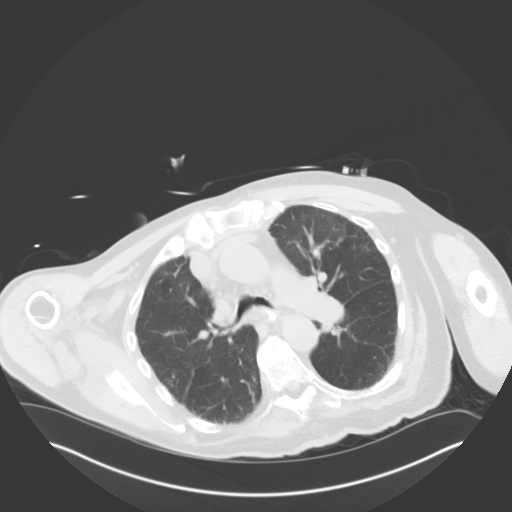
[im 123/135  mediastinal]
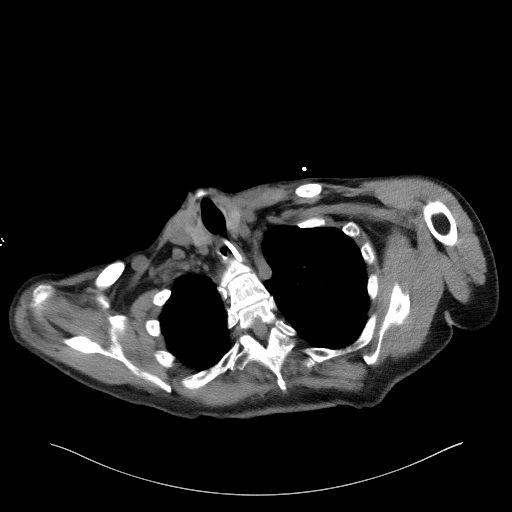
[im 123/135  lung]
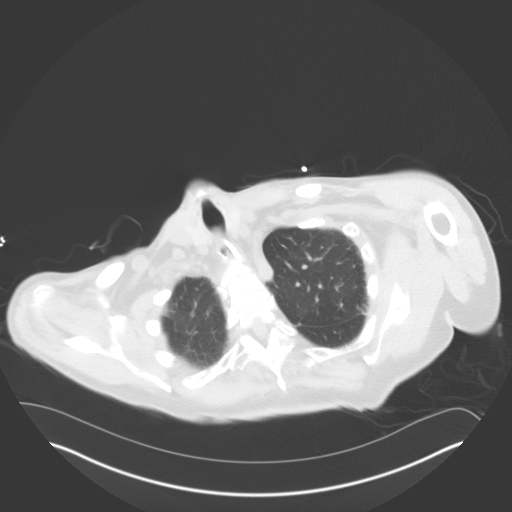

[Series 6: coronal · coronal · 0.76mm/px · 3 of 142 slices shown]
[im 29/142  lung]
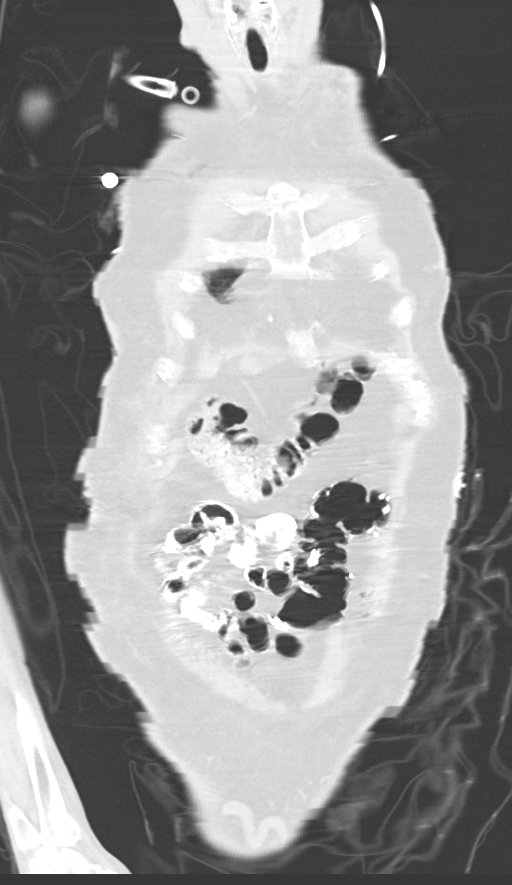
[im 57/142  lung]
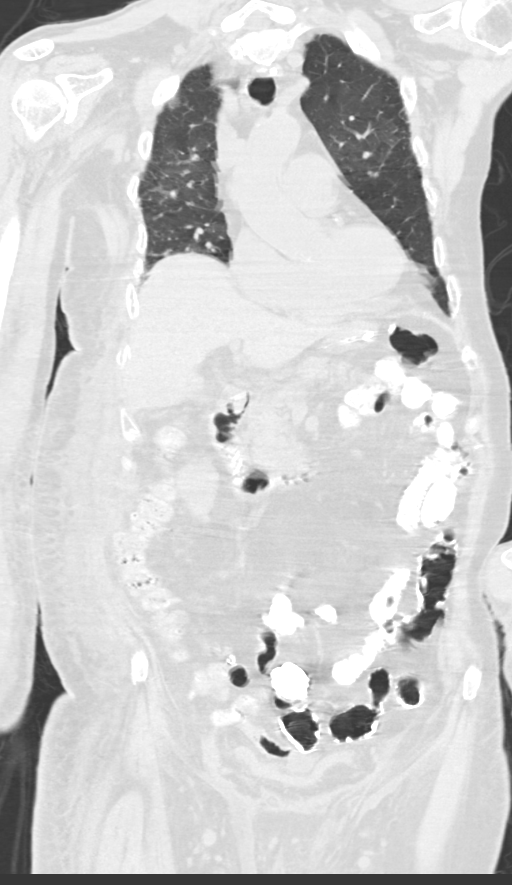
[im 85/142  lung]
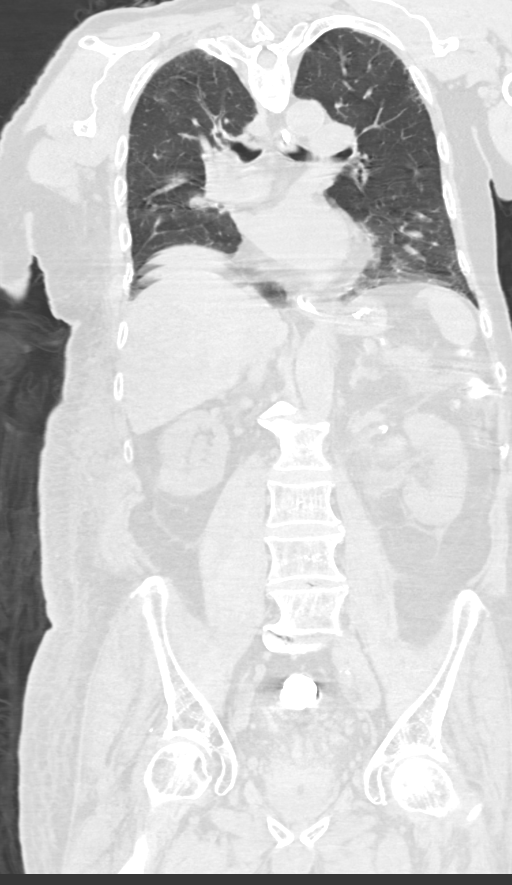

[12 of 36 positions shown; findings below may reference images not displayed]

FINDINGS: CT CHEST FINDINGS

Cardiovascular: No pericardial effusion. No thoracic aortic
aneurysm. Mild atherosclerosis of the thoracic aorta. Three-vessel
coronary artery calcifications.

Mediastinum/Nodes: No mass or enlarged lymph nodes are seen within
the mediastinum or perihilar regions. Enteric tube passes to the
stomach. Trachea is unremarkable.

Lungs/Pleura: Patchy mild scarring/fibrosis within each lung. No
consolidation, pleural effusion or pneumothorax.

Musculoskeletal: No acute or suspicious osseous finding.
Degenerative spondylosis of the kyphotic and slightly scoliotic
thoracic spine, mild to moderate in degree.

CT ABDOMEN PELVIS FINDINGS

Hepatobiliary: No focal liver abnormality is seen. Gallbladder is
unremarkable. No bile duct dilatation is seen.

Pancreas: Unremarkable. Fluid is seen adjacent to the pancreatic
tail, of uncertain origin.

Spleen: Normal in size without focal abnormality.

Adrenals/Urinary Tract: Adrenal glands are unremarkable.
Asymmetrically prominent fluid/inflammation about the LEFT kidney,
including thickening of the anterior perinephric fascia. No
hydronephrosis of the LEFT kidney. Perhaps mild pelviectasis of the
RIGHT kidney without frank hydronephrosis. Bilateral hydroureter,
mild to moderate in degree. No renal or ureteral calculi. Bladder is
decompressed by Foley catheter. No ureteral calculi seen.

Stomach/Bowel: No dilated large or small bowel loops. Appendix is
normal. Stomach is decompressed by a nasogastric tube. Scattered
diverticulosis of the sigmoid and descending colon.

Vascular/Lymphatic: Mild atherosclerosis of the normal caliber
abdominal aorta. Chronic varices within the subcutaneous soft
tissues of the lower pelvis. Incidental note of a retroaortic LEFT
renal vein. No enlarged lymph nodes are identified.

Reproductive: Prostate is unremarkable.

Other: Small amount of free fluid in the LEFT upper quadrant, most
prominently seen between the pancreatic tail and LEFT kidney. No
abscess-like collection is identified. No free intraperitoneal air.

Musculoskeletal: Degenerative spondylosis of the slightly scoliotic
lumbar spine, moderate in degree. No acute appearing osseous
abnormality.

Ill-defined fluid/edema within the subcutaneous soft tissues of the
abdomen and pelvis indicating anasarca.
IMPRESSION: 1. Ill-defined fluid/inflammation centered about the pancreatic tail
and just anterior to the LEFT kidney. This fluid/inflammation is of
uncertain origin. Differential considerations include acute
pancreatitis and pyelonephritis. Alternatively, this may represent
localized simple ascites. Recommend correlation with pancreatic lab
values and urinalysis.
2. No hydronephrosis of either kidney. No renal or ureteral calculi
identified.
3. No evidence of pneumonia. Mild scarring/fibrotic change within
each lung.
4. Three-vessel coronary artery calcifications.
5. Anasarca.

Aortic Atherosclerosis ([O4]-[O4]).

## 2020-05-04 MED ORDER — SODIUM CHLORIDE 0.9 % IV SOLN
500.0000 mg | INTRAVENOUS | Status: DC
  Administered 2020-05-04: 10:00:00 500 mg via INTRAVENOUS
  Filled 2020-05-04 (×2): qty 500

## 2020-05-04 MED ORDER — IPRATROPIUM BROMIDE 0.02 % IN SOLN
0.5000 mg | Freq: Four times a day (QID) | RESPIRATORY_TRACT | Status: DC
  Administered 2020-05-04 (×2): 0.5 mg via RESPIRATORY_TRACT
  Filled 2020-05-04 (×2): qty 2.5

## 2020-05-04 MED ORDER — IPRATROPIUM BROMIDE 0.02 % IN SOLN: 0.5000 mg | Freq: Four times a day (QID) | RESPIRATORY_TRACT | Status: DC | PRN

## 2020-05-04 MED ORDER — SODIUM CHLORIDE 0.9 % IV SOLN
1.0000 g | INTRAVENOUS | Status: DC
  Administered 2020-05-04 – 2020-05-10 (×7): 1 g via INTRAVENOUS
  Filled 2020-05-04 (×7): qty 10

## 2020-05-04 MED ORDER — CLOPIDOGREL BISULFATE 75 MG PO TABS
75.0000 mg | ORAL_TABLET | Freq: Every day | ORAL | Status: DC
  Administered 2020-05-04 – 2020-05-25 (×22): 75 mg
  Filled 2020-05-04 (×23): qty 1

## 2020-05-04 MED ORDER — INSULIN ASPART 100 UNIT/ML ~~LOC~~ SOLN
0.0000 [IU] | SUBCUTANEOUS | Status: DC
  Administered 2020-05-05 – 2020-05-17 (×31): 1 [IU] via SUBCUTANEOUS
  Administered 2020-05-17: 2 [IU] via SUBCUTANEOUS
  Administered 2020-05-18 (×5): 1 [IU] via SUBCUTANEOUS
  Administered 2020-05-19: 2 [IU] via SUBCUTANEOUS
  Administered 2020-05-19 – 2020-05-21 (×9): 1 [IU] via SUBCUTANEOUS

## 2020-05-04 MED ORDER — LEVALBUTEROL HCL 0.63 MG/3ML IN NEBU: 0.6300 mg | INHALATION_SOLUTION | Freq: Four times a day (QID) | RESPIRATORY_TRACT | Status: DC | PRN

## 2020-05-04 MED ORDER — ASPIRIN 81 MG PO CHEW
81.0000 mg | CHEWABLE_TABLET | Freq: Every day | ORAL | Status: DC
  Administered 2020-05-04 – 2020-05-25 (×22): 81 mg
  Filled 2020-05-04 (×23): qty 1

## 2020-05-04 MED ORDER — CARVEDILOL 12.5 MG PO TABS
25.0000 mg | ORAL_TABLET | Freq: Two times a day (BID) | ORAL | Status: DC
  Administered 2020-05-04 – 2020-05-10 (×11): 25 mg
  Filled 2020-05-04 (×13): qty 2

## 2020-05-04 MED ORDER — FUROSEMIDE 10 MG/ML IJ SOLN
40.0000 mg | Freq: Once | INTRAMUSCULAR | Status: AC
  Administered 2020-05-04: 13:00:00 40 mg via INTRAVENOUS
  Filled 2020-05-04: qty 4

## 2020-05-04 MED ORDER — PRAVASTATIN SODIUM 40 MG PO TABS
40.0000 mg | ORAL_TABLET | Freq: Every day | ORAL | Status: DC
  Administered 2020-05-04 – 2020-05-25 (×22): 40 mg
  Filled 2020-05-04 (×23): qty 1

## 2020-05-04 MED ORDER — CYCLOBENZAPRINE HCL 10 MG PO TABS
5.0000 mg | ORAL_TABLET | Freq: Every evening | ORAL | Status: DC | PRN
  Administered 2020-05-12 – 2020-05-24 (×4): 5 mg
  Filled 2020-05-04 (×4): qty 1

## 2020-05-04 MED ORDER — LEVALBUTEROL HCL 0.63 MG/3ML IN NEBU
0.6300 mg | INHALATION_SOLUTION | Freq: Four times a day (QID) | RESPIRATORY_TRACT | Status: DC
  Administered 2020-05-04 (×2): 0.63 mg via RESPIRATORY_TRACT
  Filled 2020-05-04 (×2): qty 3

## 2020-05-04 NOTE — Progress Notes (Signed)
Physical Therapy Treatment Patient Details Name: John Lambert MRN: [id number] DOB: Nov 29, 1940 Today's Date: 05/04/2020    History of Present Illness John Lambert is a 79 y.o. male admitted 12/14 with bil LE weakness and difficulty walking as well as left hip pain. Imaging notable for Numerous scattered punctate acute infarctions in the left  posterior frontal region consistent with micro embolic infarctions. Punctate acute infarction also affecting the splenium of  the corpus callosum.  Pt with L carotid artery stenosis s/p stent on 04/27/20. Post procedure pt with right hemiparesis, aphasia and left gaze with intraprocedural stroke. Code stroke called with CT head (-) for acute findings. Emergent L carotid diagnostic cerebral cath angiogram (-) for acute findings. MRI 12/23 (+) patchy left basal ganglia as well as left frontoparietal cortical and subcortical infarcts. 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    PT Comments    Treated pt in conjunction with OT this date to maximize pt tolerance to activity and safety along with quality of session. Pt fearful of falling, initially not desiring to sit EOB but eventually agreed to attempt it. Pt tends to push posteriorly and push with his L hand on the bed sitting statically EOB, resulting in him requiring up to mod-maxA to maintain his sitting balance. He displayed intermittent moments of only requiring min guard assist for up to ~2-3 sec before requiring further assistance, primarily when he was provided visual feedback via mirror or when leaning his elbows anteriorly onto his knees. Pt displayed poor R leg initiation this date when cued, resulting in him needing maxAx2 for all bed mob except minAx2 to roll to the R. He also required maxAx2 with bilat UEs around therapists' shoulders and bilat knees blocked to come to stand 1x for ~30 sec due to overall weakness and poor motor  planning/initiation. Will continue to follow acutely. Current recommendations remain appropriate.    Follow Up Recommendations  SNF;Supervision/Assistance - 24 hour     Equipment Recommendations  Wheelchair (measurements PT);Wheelchair cushion (measurements PT);Hospital bed (mechanical lift all if d/c home)    Recommendations for Other Services       Precautions / Restrictions Precautions Precautions: Fall Precaution Comments: cortrak, R hemi Restrictions Weight Bearing Restrictions: No    Mobility  Bed Mobility Overal bed mobility: Needs Assistance Bed Mobility: Rolling;Supine to Sit;Sit to Supine Rolling: Max assist;+2 for physical assistance   Supine to sit: HOB elevated;Max assist;+2 for physical assistance;+2 for safety/equipment Sit to supine: Max assist;+2 for physical assistance;+2 for safety/equipment;HOB elevated   General bed mobility comments: Multi-modal single step commands to bring legs laterally off R EOB 1 at a time, with min pt initiation thus maxAx2 to manage legs and trunk to come to sit EOB. MaxAx2 to rreturn to supine. Rolling to L with maxAx2, placing R foot on bed and assisting with R arm reach across body. Roll to R with minAx2 with pt pulling on therapist's hand with L hand when cued.  Transfers Overall transfer level: Needs assistance Equipment used: 2 person hand held assist Transfers: Sit to/from Stand Sit to Stand: Max assist;+2 physical assistance;+2 safety/equipment;From elevated surface         General transfer comment: Sit to stand 1x from elevated EOB with bilat knee block and bilat UEs around shoulders of therapists on either side of pt. Cued pt to push up to stand, with initiation noted with power up. MaxAx2 to maintain balance and power up  to stand.  Ambulation/Gait             General Gait Details: Deferred due to safety concerns.   Stairs             Wheelchair Mobility    Modified Rankin (Stroke Patients  Only) Modified Rankin (Stroke Patients Only) Pre-Morbid Rankin Score: No significant disability Modified Rankin: Severe disability     Balance Overall balance assessment: Needs assistance Sitting-balance support: Single extremity supported;Bilateral upper extremity supported;Feet supported Sitting balance-Leahy Scale: Poor Sitting balance - Comments: Pt pushes with L hand on bed and tends to push trunk posteriorly. Provided verbal, tactile, and visual cues through use of mirror anterior to pt to cue pt to find and maintain midline upright posture, with improved success when utilizing mirror. Majority of time pt requiring mod-maxA to maintain static sitting balance, cuing pt to transition hands to lap and lean elbows on knees to improve anterior lean with mod success, but intermittent minA and 2-3 sec of min guard assist only. Postural control: Posterior lean Standing balance support: Bilateral upper extremity supported Standing balance-Leahy Scale: Zero Standing balance comment: MaxAx2 with bilat UEs on therapists and bilat knees blocked to maintain static standing balance x1 bout of ~30 sec.                            Cognition Arousal/Alertness: Lethargic Behavior During Therapy: Flat affect Overall Cognitive Status: Impaired/Different from baseline Area of Impairment: Attention;Following commands;Problem solving;Safety/judgement;Memory;Awareness                   Current Attention Level: Alternating Memory: Decreased short-term memory Following Commands: Follows one step commands inconsistently;Follows one step commands with increased time Safety/Judgement: Decreased awareness of safety;Decreased awareness of deficits Awareness: Emergent Problem Solving: Slow processing;Decreased initiation;Difficulty sequencing;Requires verbal cues;Requires tactile cues General Comments: Pt lethargic, opening eyes only brifly when cued during session. Maintained eyes open more  sitting EOB. Pt unaware of deficits and his physical responses placing him at risk for falls. Pt felt as if he were falling anteriorly when he really was pushing posteriorly. Required repeated multi-modal single step commands, with inconsistent responses.      Exercises General Exercises - Lower Extremity Ankle Circles/Pumps: AROM;Both;5 reps;Supine Long Arc Quad: Both;Strengthening;Seated (x10 reps L; x5 reps AAROM R) Straight Leg Raises: Left;Strengthening;Other reps (comment) (3 then ceased; unable with R when cued)    General Comments        Pertinent Vitals/Pain Pain Assessment: Faces Faces Pain Scale: Hurts whole lot Pain Location: scrotum Pain Descriptors / Indicators: Discomfort;Grimacing;Guarding Pain Intervention(s): Limited activity within patient's tolerance;Monitored during session;Repositioned (notified RN)    Home Living                      Prior Function            PT Goals (current goals can now be found in the care plan section) Acute Rehab PT Goals Patient Stated Goal: did not state but pt's wife stated goal to sit up PT Goal Formulation: With patient/family Time For Goal Achievement: 05/13/20 Potential to Achieve Goals: Fair Progress towards PT goals: Progressing toward goals    Frequency    Min 3X/week      PT Plan Current plan remains appropriate    Co-evaluation PT/OT/SLP Co-Evaluation/Treatment: Yes Reason for Co-Treatment: Complexity of the patient's impairments (multi-system involvement);For patient/therapist safety;To address functional/ADL transfers PT goals addressed during session: Mobility/safety with mobility;Balance;Strengthening/ROM  AM-PAC PT "6 Clicks" Mobility   Outcome Measure  Help needed turning from your back to your side while in a flat bed without using bedrails?: A Lot Help needed moving from lying on your back to sitting on the side of a flat bed without using bedrails?: A Lot Help needed moving to  and from a bed to a chair (including a wheelchair)?: Total Help needed standing up from a chair using your arms (e.g., wheelchair or bedside chair)?: A Lot Help needed to walk in hospital room?: Total Help needed climbing 3-5 steps with a railing? : Total 6 Click Score: 9    End of Session Equipment Utilized During Treatment: Gait belt Activity Tolerance: Patient limited by lethargy;Patient limited by fatigue;Patient limited by pain Patient left: in bed;with call bell/phone within reach;with bed alarm set;with family/visitor present Nurse Communication: Mobility status;Other (comment) (scrotum swelling, pain, and oozing) PT Visit Diagnosis: Muscle weakness (generalized) (M62.81);History of falling (Z91.81);Unsteadiness on feet (R26.81);Other abnormalities of gait and mobility (R26.89);Difficulty in walking, not elsewhere classified (R26.2);Other symptoms and signs involving the nervous system (R29.898);Hemiplegia and hemiparesis Hemiplegia - Right/Left: Right Hemiplegia - caused by: Cerebral infarction     Time: 3785-8850 PT Time Calculation (min) (ACUTE ONLY): 50 min  Charges:  $Therapeutic Activity: 8-22 mins                     Raymond Gurney, PT, DPT Acute Rehabilitation Services  Pager: [phone] Office: [phone]    Jewel Baize 05/04/2020, 1:07 PM

## 2020-05-04 NOTE — Progress Notes (Signed)
Blabber scan patient >900 ML, new order from [facility] to insert foley cath. 1800 ml old bloody urine return post foley insertion.

## 2020-05-04 NOTE — Plan of Care (Signed)
  Problem: Acute Rehab OT Goals (only OT should resolve) Goal: Pt. Will Perform Grooming 05/04/2020 1353 by Howerton-Davis, Stiven Kaspar R, OT Outcome: Not Progressing Note: Patient with recent decline in function. Goals downgrade to reflect CLOF.  05/04/2020 1353 by Howerton-Davis, Roger Fasnacht R, OT Reactivated 05/04/2020 1353 by Howerton-Davis, Isahi Godwin R, OT Outcome: Not Applicable Goal: Pt. Will Transfer To Toilet 05/04/2020 1353 by Howerton-Davis, Leandrea Ackley R, OT Outcome: Not Progressing Note: Patient with recent decline in function. Goals downgrade to reflect CLOF.  05/04/2020 1353 by Howerton-Davis, Bunnie Rehberg R, OT Reactivated 05/04/2020 1353 by Howerton-Davis, Edenilson Austad R, OT Outcome: Not Applicable Goal: OT Additional ADL Goal #1 05/04/2020 1353 by Howerton-Davis, Kylinn Shropshire R, OT Outcome: Not Progressing Note: Patient with recent decline in function. Goals downgrade to reflect CLOF.  05/04/2020 1353 by Howerton-Davis, Kurtis Anastasia R, OT Reactivated 05/04/2020 1353 by Howerton-Davis, Nika Yazzie R, OT Outcome: Not Applicable

## 2020-05-04 NOTE — Progress Notes (Signed)
ANTICOAGULATION CONSULT NOTE  Pharmacy Consult for Warfarin Indication: on PTA warfarin for hx of DVT/PE, new acute stroke, new Afib  Patient Measurements: Height: 6\' 2"  (188 cm) Weight: 87.1 kg (192 lb 0.3 oz) IBW/kg (Calculated) : 82.2   Vital Signs: Temp: 98.1 F (36.7 C) (12/29 1527) Temp Source: Oral (12/29 1527) BP: 102/65 (12/29 1527) Pulse Rate: 105 (12/29 1527)  Labs: Recent Labs    05/02/20 0242 05/02/20 0741 05/03/20 0430 05/03/20 1310 05/04/20 0225 05/04/20 1326  HGB 11.5*  --  10.4*  --  10.2* 10.8*  HCT 34.7*  --  32.2*  --  31.2* 34.2*  PLT 240  --  233  --  256 271  LABPROT 21.1*  --  25.9*  --  20.6*  --   INR 1.9*  --  2.5*  --  1.8*  --   HEPARINUNFRC 0.34  --  0.31  --   --   --   CREATININE  --    < > 1.40* 1.50* 1.79* 2.12*  CKTOTAL  --   --  124  --   --   --    < > = values in this interval not displayed.    Estimated Creatinine Clearance: 31.8 mL/min (A) (by C-G formula based on SCr of 2.12 mg/dL (H)).  Assessment: 79 yo male with L symptomatic carotid stenosis.  S/p L carotid TCAR procedure w/ stenting that was complicated by postprocedure stroke.  MRI brain shows patchy L basal ganglia and L frontoparietal cortical and subcortical infarcts (new).  No hemorrhagic transformation. New stroke and new onset atrial fibrillation.    Pharmacy consulted to resume warfarin.  Heparin infusion discontinued on 05/03/20.   Today's INR is 1.8 subtherapeutic, down from INR of  2.5 on 05/03/20.  Hematuria noted this morning. RN reports it appeared to be dark old blood.  RN reports this evening that urine is clear, no bleeding currently.  I discussed anticoagulation plans with Dr. 05/05/20 this evening and confirmed heparin drip should remain discontinued and I was instructed to hold warfarin due the hematuria noted today and risk of further bleeding, as pharmacist earlier today discussed with Dr. Marland Mcalpine and Dr. Marland Mcalpine.   PTA warfarin dose: 2.5mg  daily except 1.25mg  on  Mondays  Hgb has downtrended from 16 to 10 over 1 week, remains in 10 range for 2 days now, 10.8 this morning/stable. Platelets are stable, normal in the 200s.   Goal of Therapy:   INR goal 2-3  Monitor platelets by anticoagulation protocol: Yes     Plan: Hold Warfarin today per Dr. 08-03-1973 and Dr. Marland Mcalpine Monitor daily INR, CBC, s/s bleeding Monitor hematuria   Thank you for allowing pharmacy to be part of this patients care team.  Roda Shutters, RPh Clinical Pharmacist Please check AMION for all [facility] Pharmacy phone numbers After 10:00 PM, call Main Pharmacy [phone] 05/04/2020 6:47 PM

## 2020-05-04 NOTE — Consult Note (Addendum)
Palliative Medicine Inpatient Consult Note  Reason for consult:  Goals of Care  HPI:  Per intake H&P --> The patient is an 79 y.o.malewith PMH significant for hypertension, melanoma, neurofibroma, clotting disorder, history of DVT and PE on chronic Coumadinwhopresented to the ED on 12/14 with c/o : bilateral lower extremity weakness and difficulty ambulating associated with pain in the left hip region. MRI brain obtainednoted numerousscattered punctate acute infarctions in the left posterior frontal region consistent with micro embolic infarctions which could either be in the anterior or middle cerebral arteryterritory. Punctate acute infarction also affecting the splenium ofthe corpus callosum.  Palliative care was asked to get involved to address goals of care.  Clinical Assessment/Goals of Care: I have reviewed medical records including EPIC notes, labs and imaging, received report from bedside RN, assessed the patient.    I met with John Lambert and John Lambert to further discuss diagnosis prognosis, GOC, EOL wishes, disposition and options.   I introduced Palliative Medicine as specialized medical care for people living with serious illness. It focuses on providing relief from the symptoms and stress of a serious illness. The goal is to improve quality of life for both the patient and the family.  [name] shares that she is very upset that no one informed she nor her mother that palliative care was getting involved.  I offered my apologies and tried to emphasize that palliative care is truly an additional layer of support for patients and families during times of serious illness.  John Lambert shares that John Lambert is from [place].  They have been married for over 79 years.  They met when she was 33 years of age.  They share 2 children a son and a daughter.  John Lambert used to work in [employer].  He moved to [place] after leaving his job in [place].  He is a man of  faith and practices within the [employer] denomination.  Prior to hospitalization John Lambert was able to mobilize with a Rollator.  Per his family he was fully functional.  A detailed discussion was had today regarding advanced directives, [name] does believe that these were filled out.  John Lambert confirms this and states that they are in their safe at home.  I requested that they bring in a copy of these advanced directives as it will help guide additional decisions moving forward.    Concepts specific to code status, artifical feeding and hydration, continued IV antibiotics and rehospitalization was had.    I shared with John Lambert what a cardiopulmonary resuscitation in a patient like John Lambert may look like.  We discussed the possible trauma and burden that could be endured in such instances.  Shares that as of right now John Lambert remains a full code.  I provided she and her daughter information to review to aid in additional decision-making.  The difference between a aggressive medical intervention path  and a palliative comfort care path for this patient at this time was had.  We discussed John Lambert multitude of infarctions and how this may impact his life moving forward.  We talked about the reality that he will likely have a level of dependency and perhaps will not be the same functional man that they wants new.  We discussed the likelihood of aspiration and its recurrence in the setting of dysphagia.  Patient's family is reasonably hoping for a comprehensive medical update.  Also hoping for a comprehensive neurological update.  Frustrations are significant in terms of what is  described as "poor communication".  I shared that we will offer ongoing support throughout [facility] hospitalization.  Discussed the importance of continued conversation with family and their  medical providers regarding overall plan of care and treatment options, ensuring decisions are within the context of the patients values and  GOCs.  Provided "Hard Choices for [employer]" booklet.   Decision Maker: John Lambert (spouse) [phone]  SUMMARY OF RECOMMENDATIONS   Full Code / Full Scope of care for the time being. Provided "Hard Choices for Loving People" booklet and a MOST form for review. Emphasized looking at the whole clinical picture  Plan for swallow evaluation at this point - this will help guide decisions moving forward   Plan for CT chest and abdomen in the setting of a mass-like density in the right hilum  PMT support will be ongoing  Code Status/Advance Care Planning: FULL CODE  Palliative Prophylaxis:   Oral Care, Mobility,   Additional Recommendations (Limitations, Scope, Preferences):  Continue current scope of care   Psycho-social/Spiritual:   Desire for further Chaplaincy support: Yes - John Lambert  Additional Recommendations:    Prognosis: Unclear - will depend upon neurological recovery though given significance of infarctions has a high 12 month mortality risk.  Discharge Planning: Discharge plan is unknown. Had been a plan to transition to skilled nursing.   Vitals:   05/03/20 2335 05/04/20 0317  BP: 111/63 114/64  Pulse: 94 97  Resp: 17 18  Temp: 98.3 F (36.8 C) 98.4 F (36.9 C)  SpO2: 95% 93%    Intake/Output Summary (Last 24 hours) at 05/04/2020 1740 Last data filed at 05/04/2020 8144 Gross per 24 hour  Intake 2689.84 ml  Output 650 ml  Net 2039.84 ml   Last Weight  Most recent update: 05/01/2020  5:06 AM   Weight  87.1 kg (192 lb 0.3 oz)           Gen:  NAD HEENT: Coretrack in place - dry  mucous membranes CV: Regular rate and rhythm  PULM: 2LPM [place], tachypnea ABD: soft/nontender  EXT: (+) LE edema  Neuro:Somnolent  PPS:   This conversation/these recommendations were discussed with patient primary care team, Dr. Alfredia Ferguson  Time In: 0700 Time Out: 0810 Total Time: 22 Greater than 50%  of this time was spent counseling and coordinating care  related to the above assessment and plan.  [place] Team Team Cell Phone: [phone] Please utilize secure chat with additional questions, if there is no response within 30 minutes please call the above phone number  Palliative Medicine Team providers are available by phone from 7am to 7pm daily and can be reached through the team cell phone.  Should this patient require assistance outside of these hours, please call the patient's attending physician.

## 2020-05-04 NOTE — Progress Notes (Signed)
This chaplain responded to PMT consult for spiritual care.  The chaplain checked in with the Pt. RN-Bryan before the visit.  The Pt. wife-Harriet and daughter Bridgette Habermann are bedside.  The chaplain introduced herself and offered a spiritual care presence.  The chaplain understands the Pt. recently finished PT and is exhausted.  The chaplain shared how to page a chaplain through the RN for F/U spiritual care for the Pt. and the Pt. Family.

## 2020-05-04 NOTE — Progress Notes (Signed)
  Speech Language Pathology Treatment: Dysphagia  Patient Details Name: John Lambert MRN: [id number] DOB: 11/10/1940 Today's Date: 05/04/2020 Time: 6237-6283 SLP Time Calculation (min) (ACUTE ONLY): 31 min  Assessment / Plan / Recommendation Clinical Impression  Pt was seen for dysphagia treatment. He was lethargic during the session but demonstrated adequate alertness for brief periods of time. Pt and his family were educated regarding the results of the modified barium swallow study, his risk of aspiration and, recommendations. Video recording of the study was used to facilitate education. Pt's wife and daughter had multiple questions regarding his progress, the continued need for nonoral alimentation and the fact that the Cortrak is a barrier to his being discharged to a SNF. Long-term nonoral alimentation (e.g., G-tube) was discussed and possible rehab trajectories with his being able to consume a p.o. diet in the future. All of their questions were answered their satisfaction. SLP will continue to follow pt.    HPI HPI: Pt is an 79 y.o. 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 He also has pain in left upper leg/hip region, and R foot drag; Imaging notable for Numerous scattered punctate acute infarctions in the left  posterior frontal region consistent with micro embolic infarctions  which could either be in the anterior or middle cerebral artery  territory. Punctate acute infarction also affecting the splenium of  the corpus callosum. Pt was seen by SLP on 12/17 for a BSE and SLE with no subsequent need for SLP services. Left carotid TCAR procedure with stent placement conducted on 12/22. Following the procedure he was found to be aphasic unable to speak with left gaze deviation and right hemiparesis. Emergent CTA showed patent  left carotid stent with good flow in the terminal left ICA as well as middle cerebral artery in the M1 segment. There appeared to be decreased flow in one of the M2 branches by neurology. SLP was consulted due to pt failing the [facility] Screen, but a failed swallow screen has not been documented in EMR. EEG 12/22: No seizures. MRI 12/23: New areas of acute infarction are present scattered within the  left basal ganglia and radiating white matter tracts and within the  left frontoparietal cortical and subcortical brain.      SLP Plan  Continue with current plan of care       Recommendations  Diet recommendations: NPO (limited p.o. trials of puree, 1/2 tsp boluses of nectar/honey thick liquids) Medication Administration: Via alternative means Compensations: Slow rate;Small sips/bites;Follow solids with liquid                Follow up Recommendations: Inpatient Rehab SLP Visit Diagnosis: Dysphagia, oropharyngeal phase (R13.12) Plan: Continue with current plan of care       Kanye Depree I. Vear Clock, MS, CCC-SLP Acute Rehabilitation Services Office number [phone] Pager [phone]                [name] 05/04/2020, 12:09 PM

## 2020-05-04 NOTE — Progress Notes (Signed)
PROGRESS NOTE    SOL John Lambert  [id number] DOB: 1940/08/12 DOA: 04/19/2020 PCP: Harlan Stains, MD  Brief Narrative:  The patient is an 79 y.o.malewith PMH significant for hypertension, melanoma, neurofibroma, clotting disorder, history of DVT and PE on chronic Coumadinwhopresented to the ED on 12/14 with c/o : bilateral lower extremity weakness and difficulty ambulating associated with pain in the left hip region.(has a history of neurofibroma tumor removal from his spine about 4 years ago and melanoma removal from right calf about 30 years ago.) MRI brain obtainednoted numerousscattered punctate acute infarctions in the left posterior frontal region consistent with micro embolic infarctions which could either be in the anterior or middle cerebral arteryterritory. Punctate acute infarction also affecting the splenium ofthe corpus callosum.Extensive chronic small-vessel ischemic changes elsewherethroughout the brain.Small old left parietal vertex cortical and subcorticalInfarctions.  MRI lumbar spine showedchronicarachnoiditis with adhesions in the thecal sac and distorted cauda equina nerve roots from L2-L3 to the sacrum, but no evidence ofrecurrent spinal tumor.Possible right L5/S1 radiculitis after noting L4/5 multifactorial mild to moderate spinal stenosis. Vascular surgery was consulted and took patient on 12/22 for a left transcarotid artery stenting. Patient found to have 80% carotid stenosis and then following procedure down to less than 10% with stent. However, following surgery patient noted to have difficulty moving right side expressive aphasia and left gaze deviation and code stroke immediately called. Patient taken straight to CT scan. No new acute infarction noted. Patient underwent emergent left carotid diagnostic cerebral catheter angiogram with left MCA embolism noted from previous, but no acute findings. No intervention planned. Patient sent to medical ICU in  the absence of beds in the neuro ICU.EEG noted cortical dysfunction left hemisphere, but no seizures or epileptiform discharges seen throughout recording.  He was transferred to the stroke service on 04/27/2020 and then transferred out to the [facility] service on 04/29/2020.  Currently he continues to remain weak and his right upper arm is flaccid.  Now having gross hematuria as he has been placed on aspirin Plavix as well as heparin and Coumadin.  Heparin drip has now been stopped given that his Coumadin level therapeutic.  Next Coumadin will be held given his gross hematuria.  Renal function continues to worsen slightly.  Will need to continue monitor continue IV fluid hydration.  Will discuss with nephrology if renal function continues to worsen.  Case was discussed with urology Dr. Alexis Frock who recommends obtaining baseline imaging with a CT of the abdomen pelvis with and without contrast for hematuria.  Dr. [name] does not recommend placing a Foley catheter at this point as it would likely worsen his hematuria.  Assessment & Plan:   Principal Problem:   Weakness Active Problems:   Anticoagulated on warfarin   Impaired ambulation   Fall at home, initial encounter   Essential hypertension   Blood clotting disorder (HCC)   Cerebral embolism with cerebral infarction   Stroke [facility])   Middle cerebral artery embolism, left  Acute CVA on presentation from left carotid artery stenosis status post left carotid stenting with complications with  new stroke.   -He underwent elective left carotid transcranial procedure with stenting complicated by postop stroke affecting his speech he is completely aphasic and now with dense right hemiplegia. -He had an emergent CT angiogram and cerebral catheter angiogram which did not reveal large vessel occlusion. -EEG did not show any epileptiform activities. -MRI of the brain showed patchy left basal ganglia as well as left frontoparietal cortical and subcortical  infarcts which are new in addition to the previous recent left MCA frontal infarct which was subacute. -Seen by speech therapy modified barium swallow consistent with silent aspiration. -Core track tube was placed which he pulled it out. -Core track tube reinserted 05/02/2020 by dietary.  Tube feeds restarted 05/02/2020. -PT OT recommended CIR but now he is too weak to participate and recommendation has been changed to SNF. -Hemoglobin A1c is 5.3, LDL is 84,  -Echo shows normal ejection fraction with no source of emboli. -Carotid Doppler with left ICA 80 to 99%. -He is currently on heparin and Coumadin till INR is therapeutic.  Front of his stop now that his INR is 2.5 and his Coumadin has been held given his gross hematuria -Patient is on aspirin Plavix heparin and Coumadin and vascular surgery has recommended triple therapy for 1 month and then going on Coumadin and aspirin alone  -Neurology recommends to continue aspirin and Plavix for 30 days for fresh carotid stent then aspirin alone.  However will need to continue Coumadin for A. fib but this has been held due to his hematuria for now and likely can be resumed tomorrow after discussion with neurology.  -We will consult palliative for goals of care discussion given his worsening hematuria -He is now having gross hematuria so we will need to watch carefully and Urology recommending CT Abd/Pelvis w/wo Contrast unfortunately was not done last night but because his renal function worsened with done with out contrast today  Dyspnea with increased labored breathing acute respiratory failure with hypoxia  -Chest x-ray done this morning and showed "Masslike density at the RIGHT hilum, suspicious for neoplastic mass or lymphadenopathy, less likely perihilar pneumonia. Recommend chest CT for further characterization. Diffuse bilateral interstitial prominence, presumably some degree of edema/volume overload. Additional patchy opacities within the bilateral  perihilar and basilar regions, pneumonia versus pulmonary edema, alternatively pleural plaques related to previous asbestos exposure. Enteric tube passes below the diaphragm"  -Fluid stopped and given IV Lasix 40 mg x 1 -CT scan showed no evidence of pneumonia but there was mild scarring/fibrotic change within his lung and there is no noted consolidation, pleural effusion or pneumothorax; CT did not mention any masslike consolidation -Started Xopenex/Atrovent breathing treatments -SpO2: 94 % O2 Flow Rate (L/min): 2 L/min -Continue supplemental oxygen via nasal cannula and wean O2 as follow -Continuous pulse oximetry maintain O2 saturation greater 90% -Also start empiric antibiotics in case this was a pneumonia and he is on IV ceftriaxone and IV azithromycin -Obtain procalcitonin level in the morning -Repeat chest x-ray  Left carotid artery stenosis vascular surgery status post carotid artery stenting  -Vascular was following and signed off  History of clotting disorder including PE DVT  -Was on Coumadin prior to admission.   -Doppler of the lower extremity shows chronic DVT.  Continued heparin and Coumadin but both of them and held.  INR now therapeutic. Pharmacy following INR and is now 1.8 -I spoke with Dr. Erlinda Hong who recommends holding his Coumadin today and resuming tomorrow if the urine looks clearer in the morning  Hyperlipidemia  -LDL was 84  -Continue Pravastatin 40 mg daily. -Continue the same dose upon discharge.  Paroxysmal A. fib  -CHA2DS2-VASc score above 5 continue Coumadin on discharge.   -INR slowly trending up to 2.5 today and today it is now 1.8.  Pharmacy adjusting the dose and per my discussion with Dr. Erlinda Hong he recommends to continue to hold warfarin today and resume tomorrow if his urine is clearer  Essential HTN -Currently on Carvedilol twice a day.  Off of Cleviprex drip.  Dysphagia and Nutrition  -Patient pulled out a Cortrak tube 04/2420 night.   -Patient  had MBS done on 04/29/2020 with findings consistent with silent aspiration. -Cortrak tube was placed again on 05/02/2020. -Continue with nutrition through tube feedings and they were held given his rhonchorous breath sounds -We will need to discuss PEG tube placement if the patient is not able to eat  AKI on CKD stage II, worsening Metabolic Acidosis -His creatinine did trend up and initially they thought was prerenal but could be obstructive and I feel strongly that this is obstruction -He was started on half-normal saline 100 cc an hour for 2 day has now stopped given his rhonchorous breath sounds and because he was 12 L positive -Renal work-up showing that his urinalysis was just pure blood and that his urine had many bacteria, greater than 50 RBCs, 21-50 WBCs with no squamous epithelial cells.  Urine osmolality was 577, urine creatinine was 36.69, urine sodium level 45 -Urine cultures showing Klebsiella Oxytocoa  -Renal Ultrasound showed "Bladder is distended and contains debris. Bladder infection could present this fashion. Prevoid volume 951.9 CC. Mild prominence right renal pelvis noted. 2. Increased echogenicity both kidneys consistent with chronic medical renal disease." -I spoke with with Dr. Tresa Moore of Urology who recommended a CT of the abdomen and pelvis with and without contrast for baseline imaging given his hematuria and was concerned that his bladder prevoid volume was 951. -Currently continuing his aspirin and Plavix but holding his Coumadin and heparin drip -I discussed with nephrology Dr. Carolin Sicks who feels that this is all obstruction and post renal and recommended Foley catheter placement which was done. -Patient's BUNs/creatinine is slowly trending up and went from 16/0.76 -> 29/1/30 -> 38/1.40 -> 44/1.50 -> 53/1.79 -> 55/2.12  -If not improving with Foley catheter drainage and antibiotic coverage definitely will consult nephrology for further evaluation formally  Acute  Urinary Retention -Bladder ultrasound showed greater than 960 today again and renal ultrasound as above  -Foley catheter was placed and he had immediate 1800 mL output -Continue with Foley catheter for now and drainage -May need to  add Tamsulosin -CT scan of the abdomen without pelvis done given his renal function worsening and showed some hydronephrosis of either kidney and no renal or ureteral calculi identified but there was fluid/inflammation of uncertain origin with a differential including pancreatitis and pyelonephritis but could have been also localizing simple ascites -See below  Klebsiella Urinary Tract Infection -Urine was very dark yesterday and tested and is very turbid and brown-colored but did show many bacteria, more than 50 RBCs per high-power field, no squamous epithelial cells, and then 21-50 WBCs -Culture showing greater than 100,000 colony-forming units of Klebsiella Oxytocoa -Pete urinalysis done today showed a cloudy appearance with yellow color with large hemoglobin, large leukocytes, negative nitrites, many bacteria, 21-50 RBCs per high-power field, 21-50 WBCs -Empirically started IV ceftriaxone but may need to escalate. -Patient's WBC is trending upward went from 9.1 -> 12.3 -> 15.4 -We will obtain blood cultures x2 as well -Could potentially have pyelonephritis given the fluid/inflammation of unknown etiology that is ill-defined and centered around the pancreatic tail and just anterior to the left kidney; I doubt that this is pancreatitis given that his lipase level was normal and this likely could be pyelonephritis and will have neurology weigh in further -Repeat CBC in a.m.  Gross Hematuria, improving after Foley catheter was placed -See  above -Spoke with urology who recommends holding off Foley catheter now and obtain a CT of the abdomen pelvis for further baseline imaging and then review -His Coumadin was held yesterday and will continue to hold for now and if  improves can likely resume tomorrow night -Discussed with urology Dr. Diona Fanti and he states that he will have Dr. Tresa Moore evaluate -Once urology comes by will have them evaluate the CT scan of the abdomen pelvis and weigh in on the fluid collection is ill-defined -I spoke with Dr. Erlinda Hong who recommends holding his Coumadin today and if his urine is clearer tomorrow we will resume his Coumadin in the evening  Hypokalemia -Improved -Patient potassium now 4.4 -Continue monitor if it is necessary -Repeat CMP in a.m.  DVT prophylaxis: Was anticoagulated with heparin drip and placed on Coumadin which has now been held Code Status: FULL CODE Family Communication: Spoke to wife and Daughter at bedside extensively Disposition Plan: Pending further clinical improvement and clearance by neurology; will need SNF level of care and she is medically stable  Status is: Inpatient  Remains inpatient appropriate because:Unsafe d/c plan, IV treatments appropriate due to intensity of illness or inability to take PO and Inpatient level of care appropriate due to severity of illness   Dispo: The patient is from: Home              Anticipated d/c is to: SNF              Anticipated d/c date is: 3 days              Patient currently is not medically stable to d/c.  Consultants:   Vascular Surgery  Neurology  Palliative Care Medicine  Discussed the case with nephrology Dr. Carolin Sicks  Discussed the case with urology Dr. Tresa Moore on May 03, 2020 and Dr. Diona Fanti on May 04, 2020   Procedures:  Status post left transcarotid artery stenting for symptomatic disease complicated by stroke  Antimicrobials:  Anti-infectives (From admission, onward)   Start     Dose/Rate Route Frequency Ordered Stop   05/04/20 0945  azithromycin (ZITHROMAX) 500 mg in sodium chloride 0.9 % 250 mL IVPB        500 mg 250 mL/hr over 60 Minutes Intravenous Every 24 hours 05/04/20 0859     05/04/20 0900  cefTRIAXone  (ROCEPHIN) 1 g in sodium chloride 0.9 % 100 mL IVPB        1 g 200 mL/hr over 30 Minutes Intravenous Every 24 hours 05/04/20 0803     04/27/20 0600  ceFAZolin (ANCEF) IVPB 2g/100 mL premix  Status:  Discontinued       Note to Pharmacy: Send with pt to OR   2 g 200 mL/hr over 30 Minutes Intravenous On call 04/26/20 0805 04/26/20 2018   04/27/20 0600  ceFAZolin (ANCEF) IVPB 2g/100 mL premix        2 g 200 mL/hr over 30 Minutes Intravenous 30 min pre-op 04/26/20 2013 04/27/20 0804   04/27/20 0000  ceFAZolin (ANCEF) IVPB 1 g/50 mL premix  Status:  Discontinued       Note to Pharmacy: Send with pt to OR   1 g 100 mL/hr over 30 Minutes Intravenous On call 04/26/20 0800 04/26/20 0805        Subjective: Seen and examined at bedside and is a little bit more lethargic and little bit more labored breathing today.  He wanted to rest and had just come back from his CT scan.  He denies any nausea vomiting or any pain.  Spoke to his wife and his daughter at bedside and all questions were answered to their satisfaction.  No other complaints at this time but he has been started on antibiotics for suspected urinary tract infection.  Will continue to monitor his respiratory status and after his Foley catheter is replaced he had extremely good urine output with 1800 mL.  Objective: Vitals:   05/04/20 0819 05/04/20 1250 05/04/20 1523 05/04/20 1527  BP: 140/87 120/66 97/61 102/65  Pulse: 93 89 (!) 105 (!) 105  Resp: 16 16 (!) 24 (!) 22  Temp: 98.3 F (36.8 C) 97.9 F (36.6 C) 98.1 F (36.7 C) 98.1 F (36.7 C)  TempSrc: Oral Oral Oral Oral  SpO2: 93% 95% 93% 94%  Weight:      Height:        Intake/Output Summary (Last 24 hours) at 05/04/2020 1741 Last data filed at 05/04/2020 1455 Gross per 24 hour  Intake --  Output 3800 ml  Net -3800 ml   Filed Weights   04/20/20 2034 04/30/20 0500 05/01/20 0453  Weight: 79.7 kg 87.2 kg 87.1 kg   Examination: Physical Exam:  Constitutional: The patient  is an ill-appearing Caucasian male currently in no acute distress and is slightly somnolent and drowsy wanting to rest Eyes: Lids and conjunctivae normal, sclerae anicteric  ENMT: External Ears, Nose appear normal. Grossly normal hearing.  Neck: Appears normal, supple, no cervical masses, normal ROM, no appreciable thyromegaly; no JVD Respiratory: Diminished to auscultation bilaterally with coarse breath sounds and some rhonchi but no appreciable rales or crackles.  Has a slightly increased respiratory effort is mildly tachypneic but not using any accessory muscles to breathe.  Now on supplemental oxygen via nasal cannula Cardiovascular: RRR, no murmurs / rubs / gallops. S1 and S2 auscultated.  Has minimal to 1+ lower extremity edema Abdomen: Soft, non-tender, non-distended. Bowel sounds positive.  GU: Deferred. Musculoskeletal: No clubbing / cyanosis of digits/nails.  Right upper arm is flaccid Skin: No rashes, lesions, ulcers on limited skin evaluation. No induration; Warm and dry.  Neurologic: He is somnolent and drowsy and does not really participate in neuro examination. Psychiatric: Impaired judgment and insight.  Somnolent and drowsy. Normal mood and appropriate affect.   Data Reviewed: I have personally reviewed following labs and imaging studies  CBC: Recent Labs  Lab 05/01/20 0102 05/02/20 0242 05/03/20 0430 05/04/20 0225 05/04/20 1326  WBC 10.5 8.4 9.1 12.3* 15.4*  NEUTROABS  --   --   --  10.6* 13.5*  HGB 12.5* 11.5* 10.4* 10.2* 10.8*  HCT 37.4* 34.7* 32.2* 31.2* 34.2*  MCV 91.4 92.0 92.8 92.6 95.0  PLT 223 240 233 256 271   Basic Metabolic Panel: Recent Labs  Lab 04/28/20 1600 04/29/20 0301 05/02/20 0741 05/03/20 0430 05/03/20 1310 05/04/20 0225 05/04/20 1326  NA 139   < > 145 144 143 140 144  K 3.9   < > 3.4* 3.5 4.1 4.2 4.4  CL 107   < > 115* 117* 115* 113* 114*  CO2 22   < > 22 21* 20* 20* 21*  GLUCOSE 100*   < > 132* 136* 154* 173* 131*  BUN 16   < > 29*  38* 44* 53* 55*  CREATININE 0.77   < > 1.30* 1.40* 1.50* 1.79* 2.12*  CALCIUM 8.2*   < > 8.2* 7.8* 7.9* 7.5* 8.0*  MG 1.9  --  2.1  --   --  1.8  1.9  PHOS  --   --   --   --   --  2.9 3.8   < > = values in this interval not displayed.   GFR: Estimated Creatinine Clearance: 31.8 mL/min (A) (by C-G formula based on SCr of 2.12 mg/dL (H)). Liver Function Tests: Recent Labs  Lab 04/30/20 0525 05/02/20 0741 05/03/20 0430 05/04/20 0225 05/04/20 1326  AST 27 27 34 23 19  ALT 29 29 35 32 30  ALKPHOS 39 34* 30* 30* 29*  BILITOT 1.3* 1.1 0.6 1.0 1.1  PROT 5.4* 4.9* 4.6* 4.8* 5.4*  ALBUMIN 2.8* 2.5* 2.4* 2.3* 2.4*   Recent Labs  Lab 05/04/20 1326  LIPASE 49   No results for input(s): AMMONIA in the last 168 hours. Coagulation Profile: Recent Labs  Lab 04/30/20 0525 05/01/20 0102 05/02/20 0242 05/03/20 0430 05/04/20 0225  INR 1.2 1.3* 1.9* 2.5* 1.8*   Cardiac Enzymes: Recent Labs  Lab 05/03/20 0430  CKTOTAL 124   BNP (last 3 results) No results for input(s): PROBNP in the last 8760 hours. HbA1C: No results for input(s): HGBA1C in the last 72 hours. CBG: Recent Labs  Lab 05/03/20 2341 05/04/20 0311 05/04/20 0822 05/04/20 1248 05/04/20 1651  GLUCAP 157* 151* 132* 112* 131*   Lipid Profile: No results for input(s): CHOL, HDL, LDLCALC, TRIG, CHOLHDL, LDLDIRECT in the last 72 hours. Thyroid Function Tests: No results for input(s): TSH, T4TOTAL, FREET4, T3FREE, THYROIDAB in the last 72 hours. Anemia Panel: No results for input(s): VITAMINB12, FOLATE, FERRITIN, TIBC, IRON, RETICCTPCT in the last 72 hours. Sepsis Labs: No results for input(s): PROCALCITON, LATICACIDVEN in the last 168 hours.  Recent Results (from the past 240 hour(s))  MRSA PCR Screening     Status: None   Collection Time: 04/27/20  3:39 PM   Specimen: Nasopharyngeal  Result Value Ref Range Status   MRSA by PCR NEGATIVE NEGATIVE Final    Comment:        The GeneXpert MRSA Assay (FDA approved  for NASAL specimens only), is one component of a comprehensive MRSA colonization surveillance program. It is not intended to diagnose MRSA infection nor to guide or monitor treatment for MRSA infections. Performed at [facility], [place] [address], [place], [place] [address]   Culture, Urine     Status: Abnormal (Preliminary result)   Collection Time: 05/03/20 10:42 AM   Specimen: Urine, Catheterized  Result Value Ref Range Status   Specimen Description URINE, RANDOM  Final   Special Requests NONE  Final   Culture (A)  Final    >=100,000 COLONIES/mL KLEBSIELLA OXYTOCA SUSCEPTIBILITIES TO FOLLOW Performed at [facility], [place] [address], [place], [place] [address]    Report Status PENDING  Incomplete    RN Pressure Injury Documentation:     Estimated body mass index is 24.65 kg/m as calculated from the following:   Height as of this encounter: 6\' 2"  (1.88 m).   Weight as of this encounter: 87.1 kg.  Malnutrition Type:  Nutrition Problem: Inadequate oral intake Etiology: dysphagia   Malnutrition Characteristics:  Signs/Symptoms: NPO status   Nutrition Interventions:  Interventions: Tube feeding   Radiology Studies: CT ABDOMEN PELVIS WO CONTRAST  Result Date: 05/04/2020 CLINICAL DATA:  Shortness of breath. Evaluate for pneumonia. Hematuria. EXAM: CT CHEST, ABDOMEN AND PELVIS WITHOUT CONTRAST TECHNIQUE: Multidetector CT imaging of the chest, abdomen and pelvis was performed following the standard protocol without IV contrast. COMPARISON:  None. FINDINGS: CT CHEST FINDINGS Cardiovascular: No  pericardial effusion. No thoracic aortic aneurysm. Mild atherosclerosis of the thoracic aorta. Three-vessel coronary artery calcifications. Mediastinum/Nodes: No mass or enlarged lymph nodes are seen within the mediastinum or perihilar regions. Enteric tube passes to the stomach. Trachea is unremarkable. Lungs/Pleura: Patchy mild scarring/fibrosis within each lung. No  consolidation, pleural effusion or pneumothorax. Musculoskeletal: No acute or suspicious osseous finding. Degenerative spondylosis of the kyphotic and slightly scoliotic thoracic spine, mild to moderate in degree. CT ABDOMEN PELVIS FINDINGS Hepatobiliary: No focal liver abnormality is seen. Gallbladder is unremarkable. No bile duct dilatation is seen. Pancreas: Unremarkable. Fluid is seen adjacent to the pancreatic tail, of uncertain origin. Spleen: Normal in size without focal abnormality. Adrenals/Urinary Tract: Adrenal glands are unremarkable. Asymmetrically prominent fluid/inflammation about the LEFT kidney, including thickening of the anterior perinephric fascia. No hydronephrosis of the LEFT kidney. Perhaps mild pelviectasis of the RIGHT kidney without frank hydronephrosis. Bilateral hydroureter, mild to moderate in degree. No renal or ureteral calculi. Bladder is decompressed by Foley catheter. No ureteral calculi seen. Stomach/Bowel: No dilated large or small bowel loops. Appendix is normal. Stomach is decompressed by a nasogastric tube. Scattered diverticulosis of the sigmoid and descending colon. Vascular/Lymphatic: Mild atherosclerosis of the normal caliber abdominal aorta. Chronic varices within the subcutaneous soft tissues of the lower pelvis. Incidental note of a retroaortic LEFT renal vein. No enlarged lymph nodes are identified. Reproductive: Prostate is unremarkable. Other: Small amount of free fluid in the LEFT upper quadrant, most prominently seen between the pancreatic tail and LEFT kidney. No abscess-like collection is identified. No free intraperitoneal air. Musculoskeletal: Degenerative spondylosis of the slightly scoliotic lumbar spine, moderate in degree. No acute appearing osseous abnormality. Ill-defined fluid/edema within the subcutaneous soft tissues of the abdomen and pelvis indicating anasarca. IMPRESSION: 1. Ill-defined fluid/inflammation centered about the pancreatic tail and just  anterior to the LEFT kidney. This fluid/inflammation is of uncertain origin. Differential considerations include acute pancreatitis and pyelonephritis. Alternatively, this may represent localized simple ascites. Recommend correlation with pancreatic lab values and urinalysis. 2. No hydronephrosis of either kidney. No renal or ureteral calculi identified. 3. No evidence of pneumonia. Mild scarring/fibrotic change within each lung. 4. Three-vessel coronary artery calcifications. 5. Anasarca. Aortic Atherosclerosis (ICD10-I70.0). Electronically Signed   By: Franki Cabot M.D.   On: 05/04/2020 11:34   CT CHEST WO CONTRAST  Result Date: 05/04/2020 CLINICAL DATA:  Shortness of breath. Evaluate for pneumonia. Hematuria. EXAM: CT CHEST, ABDOMEN AND PELVIS WITHOUT CONTRAST TECHNIQUE: Multidetector CT imaging of the chest, abdomen and pelvis was performed following the standard protocol without IV contrast. COMPARISON:  None. FINDINGS: CT CHEST FINDINGS Cardiovascular: No pericardial effusion. No thoracic aortic aneurysm. Mild atherosclerosis of the thoracic aorta. Three-vessel coronary artery calcifications. Mediastinum/Nodes: No mass or enlarged lymph nodes are seen within the mediastinum or perihilar regions. Enteric tube passes to the stomach. Trachea is unremarkable. Lungs/Pleura: Patchy mild scarring/fibrosis within each lung. No consolidation, pleural effusion or pneumothorax. Musculoskeletal: No acute or suspicious osseous finding. Degenerative spondylosis of the kyphotic and slightly scoliotic thoracic spine, mild to moderate in degree. CT ABDOMEN PELVIS FINDINGS Hepatobiliary: No focal liver abnormality is seen. Gallbladder is unremarkable. No bile duct dilatation is seen. Pancreas: Unremarkable. Fluid is seen adjacent to the pancreatic tail, of uncertain origin. Spleen: Normal in size without focal abnormality. Adrenals/Urinary Tract: Adrenal glands are unremarkable. Asymmetrically prominent fluid/inflammation  about the LEFT kidney, including thickening of the anterior perinephric fascia. No hydronephrosis of the LEFT kidney. Perhaps mild pelviectasis of the RIGHT kidney without frank hydronephrosis. Bilateral  hydroureter, mild to moderate in degree. No renal or ureteral calculi. Bladder is decompressed by Foley catheter. No ureteral calculi seen. Stomach/Bowel: No dilated large or small bowel loops. Appendix is normal. Stomach is decompressed by a nasogastric tube. Scattered diverticulosis of the sigmoid and descending colon. Vascular/Lymphatic: Mild atherosclerosis of the normal caliber abdominal aorta. Chronic varices within the subcutaneous soft tissues of the lower pelvis. Incidental note of a retroaortic LEFT renal vein. No enlarged lymph nodes are identified. Reproductive: Prostate is unremarkable. Other: Small amount of free fluid in the LEFT upper quadrant, most prominently seen between the pancreatic tail and LEFT kidney. No abscess-like collection is identified. No free intraperitoneal air. Musculoskeletal: Degenerative spondylosis of the slightly scoliotic lumbar spine, moderate in degree. No acute appearing osseous abnormality. Ill-defined fluid/edema within the subcutaneous soft tissues of the abdomen and pelvis indicating anasarca. IMPRESSION: 1. Ill-defined fluid/inflammation centered about the pancreatic tail and just anterior to the LEFT kidney. This fluid/inflammation is of uncertain origin. Differential considerations include acute pancreatitis and pyelonephritis. Alternatively, this may represent localized simple ascites. Recommend correlation with pancreatic lab values and urinalysis. 2. No hydronephrosis of either kidney. No renal or ureteral calculi identified. 3. No evidence of pneumonia. Mild scarring/fibrotic change within each lung. 4. Three-vessel coronary artery calcifications. 5. Anasarca. Aortic Atherosclerosis (ICD10-I70.0). Electronically Signed   By: Franki Cabot M.D.   On: 05/04/2020  11:34   US RENAL  Result Date: 05/03/2020 CLINICAL DATA:  Acute renal injury. EXAM: RENAL / URINARY TRACT ULTRASOUND COMPLETE COMPARISON:  No recent. FINDINGS: Right Kidney: Renal measurements: 11.8 x 5.4 x 5.4 cm = volume: 178.8 mL. Increased echogenicity. No mass. Mild prominence of the right renal pelvis noted. Left Kidney: Renal measurements: 12.5 x 6.6 x 5.9 cm = volume: 252.8 mL. Increased echogenicity. No mass or hydronephrosis visualized. Bladder: Bladder is distended and contains debris. Bladder infection could present this fashion. Bilateral ureteral jets visualized. Prevoid volume 951.9 cc. Other: None. IMPRESSION: 1. Bladder is distended and contains debris. Bladder infection could present this fashion. Prevoid volume 951.9 CC. Mild prominence right renal pelvis noted. 2. Increased echogenicity both kidneys consistent with chronic medical renal disease. Electronically Signed   By: Marcello Moores  Register   On: 05/03/2020 12:30   DG CHEST PORT 1 VIEW  Result Date: 05/04/2020 CLINICAL DATA:  Shortness of breath EXAM: PORTABLE CHEST 1 VIEW COMPARISON:  Chest x-ray dated 01/10/2006 FINDINGS: Heart size is normal. Masslike density at the RIGHT hilum. Diffuse interstitial prominence, presumably edema. Additional bilateral perihilar and basilar opacities. No pleural effusion or pneumothorax is seen. Osseous structures about the chest are unremarkable. Enteric tube passes below the diaphragm. IMPRESSION: 1. Masslike density at the RIGHT hilum, suspicious for neoplastic mass or lymphadenopathy, less likely perihilar pneumonia. Recommend chest CT for further characterization. 2. Diffuse bilateral interstitial prominence, presumably some degree of edema/volume overload. 3. Additional patchy opacities within the bilateral perihilar and basilar regions, pneumonia versus pulmonary edema, alternatively pleural plaques related to previous asbestos exposure. 4. Enteric tube passes below the diaphragm. Electronically  Signed   By: Franki Cabot M.D.   On: 05/04/2020 08:22   DG Swallowing Func-Speech Pathology  Result Date: 05/04/2020 Objective Swallowing Evaluation: Type of Study: MBS-Modified Barium Swallow Study  Patient Details Name: John Lambert MRN: [id number] Date of Birth: April 13, 1941 Today's Date: 05/04/2020 Time: SLP Start Time (ACUTE ONLY): [id number] -SLP Stop Time (ACUTE ONLY): 0853 SLP Time Calculation (min) (ACUTE ONLY): 18 min Past Medical History: Past Medical History: Diagnosis Date . Blood clotting  disorder ([place])  . Blood dyscrasia  . Cancer ([place])   melanoma removed from right calf . Hypertension  . Neuromuscular disorder ([place])   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 Past Surgical History: Past Surgical History: Procedure Laterality Date . IR ANGIO INTRA EXTRACRAN SEL COM CAROTID INNOMINATE UNI L MOD SED  04/27/2020 . RADIOLOGY WITH ANESTHESIA N/A 04/27/2020  Procedure: IR WITH ANESTHESIA;  Surgeon: Radiologist, Medication, MD;  Location: [facility];  Service: Radiology;  Laterality: N/A; . SKIN BIOPSY   . TRANSCAROTID ARTERY REVASCULARIZATION Left 04/27/2020  Procedure: LEFT TRANSCAROTID ARTERY REVASCULARIZATION;  Surgeon: Serafina Mitchell, MD;  Location: [facility];  Service: Vascular;  Laterality: Left; . ULTRASOUND GUIDANCE FOR VASCULAR ACCESS Left 04/27/2020  Procedure: ULTRASOUND GUIDANCE FOR VASCULAR ACCESS;  Surgeon: Serafina Mitchell, MD;  Location: [facility] OR;  Service: Vascular;  Laterality: Left; HPI: Pt is an 79 y.o. 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 He also has pain in left upper leg/hip region, and R foot drag; Imaging notable for Numerous scattered punctate acute infarctions in the left  posterior frontal region consistent with micro embolic infarctions  which could either be in the anterior or middle cerebral artery  territory. Punctate acute  infarction also affecting the splenium of  the corpus callosum. Pt was seen by SLP on 12/17 for a BSE and SLE with no subsequent need for SLP services. Left carotid TCAR procedure with stent placement conducted on 12/22. Following the procedure he was found to be aphasic unable to speak with left gaze deviation and right hemiparesis. Emergent CTA showed patent left carotid stent with good flow in the terminal left ICA as well as middle cerebral artery in the M1 segment. There appeared to be decreased flow in one of the M2 branches by neurology. SLP was consulted due to pt failing the [place], but a failed swallow screen has not been documented in EMR. EEG 12/22: No seizures  Subjective: alert, not very verbally communicative but responding with gesture Assessment / Plan / Recommendation CHL IP CLINICAL IMPRESSIONS 05/04/2020 Clinical Impression Pt's presentation appears similar to that noted during the last MBS on 12/24, but with improved laryngeal sensation. He presented with oorpharyngeal dysphagia characterized by reduced posterior bolus propulsion, lingual retraction, and reduced anterior laryngeal moment. He exhibited vallecular residue, pyriform sinus residue, incomplete epiglottic inversion, reduced laryngeal vestibule closure, and an inconsistent pharyngeal delay. Aspiration (PAS 7) was inconsistently noted with full-tsp boluses nectar thick liquids and more consistently with full-tsp boluses of honey thick liquids. Aspiration was secondary to impaired timing of the swallow or aspiration of pyriform sinus residue during secondary swallows. All episodes of aspiration resulted in coughing, but cough was weak and ineffective. Amount of residue was improved with a left head turn posture and frequency of aspiration reduced. However, pt exhibited difficulty demonstrating with full lateralization to the left and penetration (PAS 3) was noted when his head was close to midline. It is recommended that  nonoral alimentation be continued. Use of multiple precautions would still be necessary to ensure safety of p.o. intake including use of 1/2 tsp boluses of liquids and a left head turn. SLP anticipates that pt will have difficulty consistently using all of these and his risk of aspiration is moderate to high without their observace. SLP Visit Diagnosis Dysphagia, oropharyngeal phase (R13.12) Attention and concentration deficit following --  Frontal lobe and executive function deficit following -- Impact on safety and function Moderate aspiration risk;Risk for inadequate nutrition/hydration   CHL IP TREATMENT RECOMMENDATION 05/04/2020 Treatment Recommendations Therapy as outlined in treatment plan below;F/U MBS in --- days (Comment)   Prognosis 05/04/2020 Prognosis for Safe Diet Advancement Good Barriers to Reach Goals Severity of deficits Barriers/Prognosis Comment -- CHL IP DIET RECOMMENDATION 05/04/2020 SLP Diet Recommendations Other (Comment);Alternative means - long-term Liquid Administration via -- Medication Administration Via alternative means Compensations Slow rate;Small sips/bites;Follow solids with liquid Postural Changes --   CHL IP OTHER RECOMMENDATIONS 05/04/2020 Recommended Consults -- Oral Care Recommendations Oral care QID Other Recommendations --   CHL IP FOLLOW UP RECOMMENDATIONS 05/04/2020 Follow up Recommendations Inpatient Rehab   CHL IP FREQUENCY AND DURATION 05/04/2020 Speech Therapy Frequency (ACUTE ONLY) min 2x/week Treatment Duration 2 weeks      CHL IP ORAL PHASE 05/04/2020 Oral Phase Impaired Oral - Pudding Teaspoon -- Oral - Pudding Cup -- Oral - Honey Teaspoon Reduced posterior propulsion Oral - Honey Cup Reduced posterior propulsion Oral - Nectar Teaspoon Reduced posterior propulsion Oral - Nectar Cup Reduced posterior propulsion Oral - Nectar Straw Reduced posterior propulsion Oral - Thin Teaspoon -- Oral - Thin Cup -- Oral - Thin Straw Reduced posterior propulsion;Lingual pumping  Oral - Puree Reduced posterior propulsion;Lingual pumping Oral - Mech Soft -- Oral - Regular -- Oral - Multi-Consistency -- Oral - Pill -- Oral Phase - Comment --  CHL IP PHARYNGEAL PHASE 05/04/2020 Pharyngeal Phase Impaired Pharyngeal- Pudding Teaspoon -- Pharyngeal -- Pharyngeal- Pudding Cup -- Pharyngeal -- Pharyngeal- Honey Teaspoon Penetration/Aspiration before swallow;Penetration/Aspiration during swallow;Pharyngeal residue - valleculae;Pharyngeal residue - pyriform;Reduced airway/laryngeal closure;Reduced tongue base retraction;Reduced anterior laryngeal mobility Pharyngeal Material enters airway, passes BELOW cords and not ejected out despite cough attempt by patient;Material enters airway, remains ABOVE vocal cords and not ejected out Pharyngeal- Honey Cup Penetration/Aspiration before swallow;Penetration/Aspiration during swallow;Pharyngeal residue - valleculae;Pharyngeal residue - pyriform;Reduced airway/laryngeal closure;Reduced tongue base retraction;Reduced anterior laryngeal mobility Pharyngeal Material enters airway, passes BELOW cords and not ejected out despite cough attempt by patient Pharyngeal- Nectar Teaspoon Penetration/Aspiration before swallow;Penetration/Aspiration during swallow;Pharyngeal residue - valleculae;Pharyngeal residue - pyriform;Reduced airway/laryngeal closure;Reduced tongue base retraction;Reduced anterior laryngeal mobility Pharyngeal Material enters airway, passes BELOW cords and not ejected out despite cough attempt by patient Pharyngeal- Nectar Cup Penetration/Aspiration before swallow;Penetration/Aspiration during swallow;Pharyngeal residue - valleculae;Pharyngeal residue - pyriform;Reduced airway/laryngeal closure;Reduced tongue base retraction;Reduced anterior laryngeal mobility Pharyngeal Material enters airway, passes BELOW cords and not ejected out despite cough attempt by patient Pharyngeal- Nectar Straw -- Pharyngeal -- Pharyngeal- Thin Teaspoon -- Pharyngeal --  Pharyngeal- Thin Cup -- Pharyngeal -- Pharyngeal- Thin Straw NT Pharyngeal -- Pharyngeal- Puree Pharyngeal residue - valleculae;Pharyngeal residue - pyriform;Reduced airway/laryngeal closure;Reduced anterior laryngeal mobility;Reduced tongue base retraction Pharyngeal -- Pharyngeal- Mechanical Soft -- Pharyngeal -- Pharyngeal- Regular -- Pharyngeal -- Pharyngeal- Multi-consistency -- Pharyngeal -- Pharyngeal- Pill -- Pharyngeal -- Pharyngeal Comment --  CHL IP CERVICAL ESOPHAGEAL PHASE 05/04/2020 Cervical Esophageal Phase WFL Pudding Teaspoon -- Pudding Cup -- Honey Teaspoon -- Honey Cup -- Nectar Teaspoon -- Nectar Cup -- Nectar Straw -- Thin Teaspoon -- Thin Cup -- Thin Straw -- Puree -- Mechanical Soft -- Regular -- Multi-consistency -- Pill -- Cervical Esophageal Comment -- Shanika I. Hardin Negus, [place], [place] Office number [phone] Pager [place] 05/04/2020, 10:23 AM              Scheduled Meds: . aspirin  81 mg Per Tube Daily  .  carvedilol  25 mg Per Tube BID WC  . Chlorhexidine Gluconate Cloth  6 each Topical Daily  . clopidogrel  75 mg Per Tube Daily  . feeding supplement (PROSource TF)  45 mL Per Tube Daily  . ipratropium  0.5 mg Nebulization Q6H  . levalbuterol  0.63 mg Nebulization Q6H  . pravastatin  40 mg Per Tube Daily  . Warfarin - Pharmacist Dosing Inpatient   Does not apply q1600   Continuous Infusions: . azithromycin 500 mg (05/04/20 1025)  . cefTRIAXone (ROCEPHIN)  IV 1 g (05/04/20 1022)  . feeding supplement (JEVITY 1.2 CAL) 1,000 mL (05/02/20 1108)    LOS: 13 days   Kerney Elbe, DO [facility] PAGER is on [place]  If 7PM-7AM, please contact night-coverage [email or web address]

## 2020-05-04 NOTE — Progress Notes (Signed)
Modified Barium Swallow Progress Note  Patient Details  Name: John Lambert MRN: [id number] Date of Birth: January 23, 1941  Today's Date: 05/04/2020  Modified Barium Swallow completed.  Full report located under Chart Review in the Imaging Section.  Brief recommendations include the following:  Clinical Impression  Pt's presentation appears similar to that noted during the last MBS on 12/24, but with improved laryngeal sensation. He presented with oorpharyngeal dysphagia characterized by reduced posterior bolus propulsion, lingual retraction, and reduced anterior laryngeal moment. He exhibited vallecular residue, pyriform sinus residue, incomplete epiglottic inversion, reduced laryngeal vestibule closure, and an inconsistent pharyngeal delay. Aspiration (PAS 7) was inconsistently noted with full-tsp boluses nectar thick liquids and more consistently with full-tsp boluses of honey thick liquids. Aspiration was secondary to impaired timing of the swallow or aspiration of pyriform sinus residue during secondary swallows. All episodes of aspiration resulted in coughing, but cough was weak and ineffective. Amount of residue was improved with a left head turn posture and frequency of aspiration reduced. However, pt exhibited difficulty demonstrating with full lateralization to the left and penetration (PAS 3) was noted when his head was close to midline. It is recommended that nonoral alimentation be continued. Use of multiple precautions would still be necessary to ensure safety of p.o. intake including use of 1/2 tsp boluses of liquids and a left head turn. SLP anticipates that pt will have difficulty consistently using all of these and his risk of aspiration is moderate to high without their observace.   Swallow Evaluation Recommendations       SLP Diet Recommendations: Other (Comment);Alternative means - long-term (small snacks of purees and pre-thickened nectar thick liquids from the floor stock via 1/2  tsp)       Medication Administration: Via alternative means (could give crushed in puree if alternative access not available)   Supervision: Staff to assist with self feeding;Full supervision/cueing for compensatory strategies   Compensations: Slow rate;Small sips/bites;Follow solids with liquid       Oral Care Recommendations: Oral care QID      [name] I. Vear Clock, MS, CCC-SLP Acute Rehabilitation Services Office number [phone] Pager [phone]   [name] 05/04/2020,10:21 AM

## 2020-05-04 NOTE — Consult Note (Signed)
Urology Consult  Consulting MD: Gypsy Lore  CC: Gross hematuria  HPI: This is a 79year old male with gross hematuria.  He is status post carotid artery procedure with postprocedural stroke, now is lethargic and minimally responsive.  The patient, at one point, was on Coumadin/heparin, aspirin, Plavix.  Since the gross hematuria presented, his heparin and Coumadin have been stopped.  The patient did have a distended bladder on renal ultrasound performed yesterday.  At that point, bladder volume was approximately 950 mL.  Urologic consultation was initiated last night and the on-call physician ordered a CT scan which, as of this morning, was not done.  The patient had been managed with a condom catheter until this morning when I was consulted.  Since the catheter has been placed and the Coumadin/heparin have been stopped, the urine has cleared appropriately.  The daughter is in the room with the patient.  He is unresponsive.  She answers questions well.  That she knows of, he has no prior urologic history.  They live in [facility].  He has not had radiation to the pelvis.  He has not had kidney stones.  CT abdomen and pelvis was performed this morning revealed no renal or ureteral abnormalities, no renal calculi.  Bladder was decompressed with a Foley catheter.  I have not been able to view the images as they are not available in the PACS system.  PMH: Past Medical History:  Diagnosis Date  . Blood clotting disorder ([facility])   . Blood dyscrasia   . Cancer ([facility])    melanoma removed from right calf  . Hypertension   . Neuromuscular disorder ([facility])    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    PSH: Past Surgical History:  Procedure Laterality Date  . IR ANGIO INTRA EXTRACRAN SEL COM CAROTID INNOMINATE UNI L MOD SED  04/27/2020  . RADIOLOGY WITH ANESTHESIA N/A 04/27/2020   Procedure: IR WITH ANESTHESIA;  Surgeon: Radiologist, Medication, MD;  Location: [facility] OR;  Service: Radiology;  Laterality: N/A;   . SKIN BIOPSY    . TRANSCAROTID ARTERY REVASCULARIZATION Left 04/27/2020   Procedure: LEFT TRANSCAROTID ARTERY REVASCULARIZATION;  Surgeon: Nada Libman, MD;  Location: [facility] OR;  Service: Vascular;  Laterality: Left;  . ULTRASOUND GUIDANCE FOR VASCULAR ACCESS Left 04/27/2020   Procedure: ULTRASOUND GUIDANCE FOR VASCULAR ACCESS;  Surgeon: Nada Libman, MD;  Location: [facility] OR;  Service: Vascular;  Laterality: Left;    Allergies: Allergies  Allergen Reactions  . Hctz [Hydrochlorothiazide] Other (See Comments)    dizzy  . Hydrocodone Itching  . Vicodin [Hydrocodone-Acetaminophen] Itching    Medications: Medications Prior to Admission  Medication Sig Dispense Refill Last Dose  . acetaminophen (TYLENOL) 500 MG tablet Take 500 mg by mouth every 6 (six) hours as needed for mild pain.   04/18/2020 at Unknown time  . carvedilol (COREG) 25 MG tablet Take 25 mg by mouth 2 (two) times daily with a meal.   04/18/2020 at 6pm  . lisinopril (ZESTRIL) 20 MG tablet Take 20 mg by mouth daily.   04/18/2020  . pravastatin (PRAVACHOL) 20 MG tablet Take 20 mg by mouth daily.   04/18/2020 at Unknown time  . warfarin (COUMADIN) 2.5 MG tablet Take 1.25-2.5 mg by mouth See admin instructions. Take 1 tablet (2.5 MG) daily except on Mondays take 1/2 tablet (1.25 MG)   04/18/2020 at 6pm     Social History: Social History   Socioeconomic History  . Marital status: Married    Spouse name: Not  on file  . Number of children: Not on file  . Years of education: Not on file  . Highest education level: Not on file  Occupational History  . Not on file  Tobacco Use  . Smoking status: Former Research scientist (life sciences)  . Smokeless tobacco: Never Used  Vaping Use  . Vaping Use: Never used  Substance and Sexual Activity  . Alcohol use: Yes    Alcohol/week: 2.0 standard drinks    Types: 2 Glasses of wine per week  . Drug use: Never  . Sexual activity: Not on file  Other Topics Concern  . Not on file  Social History  Narrative  . Not on file   Social Determinants of Health   Financial Resource Strain: Not on file  Food Insecurity: Not on file  Transportation Needs: Not on file  Physical Activity: Not on file  Stress: Not on file  Social Connections: Not on file  Intimate Partner Violence: Not on file    Family History: History reviewed. No pertinent family history.  Review of Systems: Patient not able to answer questions Positive: Gross hematuria, urinary retention Negative: **  A further 10 point review of systems was negative except what is listed in the HPI.  Physical Exam: @VITALS2 @ General: Lethargic, minimally responsive Head:  Normocephalic.  Atraumatic. ENT:  EOMI.  Mucous membranes d CV:  Regular rate. Pulmonary: Equal effort bilaterally.   Skin:  Normal turgor.  No visible rash. Extremity: No gross deformity of extremities.    Studies:  Recent Labs    05/04/20 0225 05/04/20 1326  HGB 10.2* 10.8*  WBC 12.3* 15.4*  PLT 256 271    Recent Labs    05/04/20 0225 05/04/20 1326  NA 140 144  K 4.2 4.4  CL 113* 114*  CO2 20* 21*  BUN 53* 55*  CREATININE 1.79* 2.12*  CALCIUM 7.5* 8.0*  GFRNONAA 38* 31*     Recent Labs    05/03/20 0430 05/04/20 0225  INR 2.5* 1.8*     Invalid input(s): ABG  I have reviewed the patient's CT transcription.  I have reviewed the patient's renal ultrasound images.  I have reviewed the patient's laboratories and prior notes   Assessment: 1.  Gross hematuria.  Etiology unknown at this time, but may well be prostate or bladder source.  This has cleared with him having antiplatelet therapy decreased somewhat.  He has normal-appearing kidneys and bladder on CT scan  2.  Urinary retention, inadequate bladder emptying.  His urinary residual at the time of catheter placement was over 1.5 L.  Plan: 1.  I do not think further evaluation needs to be done at this point regarding his hematuria as it is clearing  2.  I would recommend  leaving catheter in for some time, as he was not properly emptying before catheter placed.  This may be necessary long-term  3.  At this point, I do not think further urologic investigation/follow-up needed.  Reconsult if you need [place] during his hospitalization    [phone]

## 2020-05-04 NOTE — Progress Notes (Signed)
Occupational Therapy Treatment Patient Details Name: John Lambert MRN: [id number] DOB: 03-08-1941 Today's Date: 05/04/2020    History of present illness John Lambert is a 79 y.o. male admitted 12/14 with bil LE weakness and difficulty walking as well as left hip pain. Imaging notable for Numerous scattered punctate acute infarctions in the left  posterior frontal region consistent with micro embolic infarctions. Punctate acute infarction also affecting the splenium of  the corpus callosum.  Pt with L carotid artery stenosis s/p stent on 04/27/20. Post procedure pt with right hemiparesis, aphasia and left gaze with intraprocedural stroke. Code stroke called with CT head (-) for acute findings. Emergent L carotid diagnostic cerebral cath angiogram (-) for acute findings. MRI 12/23 (+) patchy left basal ganglia as well as left frontoparietal cortical and subcortical infarcts. 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   OT comments  Patient met lying supine in bed with daughter and wife at bedside. Given patient recent decline in function, co-treat utilized to maximize participation as patient would likely be unable to tolerate 2 therapies this date. OT treatment session with focus on self-care re-education, RUE NMR, and EOB activity tolerance with patient requiring increased coaxing/encouragement from family to participate. Increased lethargy despite modifications to the environment including increasing lighting. +2 assist for bed mobility with maximal multimodal cues. Once seated EOB, patient required external assist with Mod A initially, progressing to short periods of Min guard to Min A with visual feedback from mirror and cues for orientation to midline. Patient needing additional assist with fatigue. Sit to stand from EOB utilizing 3-Musketeer technique and bilateral knee block with patient requiring Max A +2. Patient would benefit from  continued acute OT services in prep for safe d/c to next level of care with recommendation for SNF rehab.    Follow Up Recommendations  SNF    Equipment Recommendations  Other (comment) (TBD)    Recommendations for Other Services      Precautions / Restrictions Precautions Precautions: Fall Precaution Comments: cortrak, R hemi, foley Restrictions Weight Bearing Restrictions: No       Mobility Bed Mobility Overal bed mobility: Needs Assistance Bed Mobility: Rolling;Supine to Sit;Sit to Supine Rolling: Max assist;+2 for physical assistance   Supine to sit: HOB elevated;Max assist;+2 for physical assistance;+2 for safety/equipment Sit to supine: Max assist;+2 for physical assistance;+2 for safety/equipment;HOB elevated   General bed mobility comments: Multi-modal single step commands to bring legs laterally off R EOB 1 at a time, with min pt initiation thus maxAx2 to manage legs and trunk to come to sit EOB. MaxAx2 to rreturn to supine. Rolling to L with maxAx2, placing R foot on bed and assisting with R arm reach across body. Roll to R with minAx2 with pt pulling on therapist's hand with L hand when cued.  Transfers Overall transfer level: Needs assistance Equipment used: 2 person hand held assist Transfers: Sit to/from Stand Sit to Stand: Max assist;+2 physical assistance;+2 safety/equipment;From elevated surface         General transfer comment: Sit to stand 1x from elevated EOB with bilat knee block and 3-Musketeer technique with MaxAx2 and maximal cues for sequencing.    Balance Overall balance assessment: Needs assistance Sitting-balance support: Single extremity supported;Bilateral upper extremity supported;Feet supported Sitting balance-Leahy Scale: Poor Sitting balance - Comments: Pt pushes with L hand on bed and tends to push trunk posteriorly. Provided verbal, tactile, and visual cues through use of mirror  anterior to pt to cue pt to find and maintain midline  upright posture, with improved success when utilizing mirror. Majority of time pt requiring mod-maxA to maintain static sitting balance, cuing pt to transition hands to lap and lean elbows on knees to improve anterior lean with mod success, but intermittent minA and 2-3 sec of min guard assist only. Postural control: Posterior lean Standing balance support: Bilateral upper extremity supported Standing balance-Leahy Scale: Zero Standing balance comment: MaxAx2 with bilat UEs on therapists and bilat knees blocked to maintain static standing balance x1 bout of ~30 sec with 3-Musketeer technique.                           ADL either performed or assessed with clinical judgement   ADL Overall ADL's : Needs assistance/impaired     Grooming: Maximal assistance;Bed level Grooming Details (indicate cue type and reason): Max A for oral hygiene with suction toothbrush and maximal cueing for sequencing.                                     Vision       Perception     Praxis      Cognition Arousal/Alertness: Lethargic Behavior During Therapy: Flat affect Overall Cognitive Status: Impaired/Different from baseline Area of Impairment: Attention;Following commands;Problem solving;Safety/judgement;Memory;Awareness                   Current Attention Level: Alternating Memory: Decreased short-term memory Following Commands: Follows one step commands inconsistently;Follows one step commands with increased time Safety/Judgement: Decreased awareness of safety;Decreased awareness of deficits Awareness: Emergent Problem Solving: Slow processing;Decreased initiation;Difficulty sequencing;Requires verbal cues;Requires tactile cues General Comments: Pt lethargic, opening eyes only brifly when cued during session. Maintained eyes open more sitting EOB. Pt unaware of deficits and his physical responses placing him at risk for falls. Pt felt as if he were falling anteriorly  when he really was pushing posteriorly. Required repeated multi-modal single step commands, with inconsistent responses.        Exercises Exercises: Other exercises General Exercises - Lower Extremity Ankle Circles/Pumps: AROM;Both;5 reps;Supine Long Arc Quad:  (x10 reps L; x5 reps AAROM R) Straight Leg Raises:  (3 then ceased; unable with R when cued) Other Exercises Other Exercises: Passive RUE NMR HEP   Shoulder Instructions       General Comments Patient with thick coating on tongue. Oral hygiene completed.    Pertinent Vitals/ Pain       Pain Assessment: Faces Faces Pain Scale: Hurts whole lot Pain Location: scrotum Pain Descriptors / Indicators: Discomfort;Grimacing;Guarding Pain Intervention(s): Limited activity within patient's tolerance;Monitored during session;Repositioned;Other (comment) (RN notified)  Home Living                                          Prior Functioning/Environment              Frequency  Min 2X/week        Progress Toward Goals  OT Goals(current goals can now be found in the care plan section)     Acute Rehab OT Goals Patient Stated Goal: Per patient's wife and daughter, goals to get stronger. OT Goal Formulation: With patient Time For Goal Achievement: 05/18/20 Potential to Achieve Goals: Fair ADL Goals Pt Will Perform  Grooming: with min assist;bed level Additional ADL Goal #1: Patient will tolerate static unsupported sitting at EOB for >5 minute with Min A. Additional ADL Goal #2: Patient will demonstrate functional transfers with use of LRAD and Mod A.  Plan Discharge plan needs to be updated;Frequency needs to be updated    Co-evaluation    PT/OT/SLP Co-Evaluation/Treatment: Yes Reason for Co-Treatment: Complexity of the patient's impairments (multi-system involvement) PT goals addressed during session: Mobility/safety with mobility;Balance;Strengthening/ROM OT goals addressed during session: ADL's and  self-care;Strengthening/ROM      AM-PAC OT "6 Clicks" Daily Activity     Outcome Measure   Help from another person eating meals?: Total (NPO) Help from another person taking care of personal grooming?: Total Help from another person toileting, which includes using toliet, bedpan, or urinal?: Total Help from another person bathing (including washing, rinsing, drying)?: Total Help from another person to put on and taking off regular upper body clothing?: Total Help from another person to put on and taking off regular lower body clothing?: Total 6 Click Score: 6    End of Session Equipment Utilized During Treatment: Gait belt  OT Visit Diagnosis: Unsteadiness on feet (R26.81);Other abnormalities of gait and mobility (R26.89);Muscle weakness (generalized) (M62.81);Pain Pain - Right/Left: Right Pain - part of body: Shoulder;Arm;Hand (Scrotum)   Activity Tolerance Patient limited by lethargy   Patient Left in bed;with call bell/phone within reach;with bed alarm set;with nursing/sitter in room   Nurse Communication Mobility status;Other (comment) (Scrotal swelling with discharge.)        Time: 0929-5747 OT Time Calculation (min): 48 min  Charges: OT General Charges $OT Visit: 1 Visit OT Treatments $Self Care/Home Management : 8-22 mins $Neuromuscular Re-education: 8-22 mins  Kurk Corniel H. OTR/L Supplemental OT, Department of rehab services [phone]   Pearly Bartosik R H. 05/04/2020, 1:57 PM

## 2020-05-05 ENCOUNTER — Inpatient Hospital Stay (HOSPITAL_COMMUNITY): Payer: Medicare Other

## 2020-05-05 DIAGNOSIS — D689 Coagulation defect, unspecified: Secondary | ICD-10-CM | POA: Diagnosis not present

## 2020-05-05 DIAGNOSIS — R531 Weakness: Secondary | ICD-10-CM | POA: Diagnosis not present

## 2020-05-05 DIAGNOSIS — Z7901 Long term (current) use of anticoagulants: Secondary | ICD-10-CM | POA: Diagnosis not present

## 2020-05-05 DIAGNOSIS — I63132 Cerebral infarction due to embolism of left carotid artery: Secondary | ICD-10-CM | POA: Diagnosis not present

## 2020-05-05 DIAGNOSIS — I10 Essential (primary) hypertension: Secondary | ICD-10-CM | POA: Diagnosis not present

## 2020-05-05 DIAGNOSIS — N5089 Other specified disorders of the male genital organs: Secondary | ICD-10-CM

## 2020-05-05 LAB — COMPREHENSIVE METABOLIC PANEL
ALT: 35 U/L (ref 0–44)
AST: 26 U/L (ref 15–41)
Albumin: 2.5 g/dL — ABNORMAL LOW (ref 3.5–5.0)
Alkaline Phosphatase: 31 U/L — ABNORMAL LOW (ref 38–126)
Anion gap: 10 (ref 5–15)
BUN: 55 mg/dL — ABNORMAL HIGH (ref 8–23)
CO2: 23 mmol/L (ref 22–32)
Calcium: 8.2 mg/dL — ABNORMAL LOW (ref 8.9–10.3)
Chloride: 112 mmol/L — ABNORMAL HIGH (ref 98–111)
Creatinine, Ser: 1.81 mg/dL — ABNORMAL HIGH (ref 0.61–1.24)
GFR, Estimated: 37 mL/min — ABNORMAL LOW (ref >60)
Glucose, Bld: 144 mg/dL — ABNORMAL HIGH (ref 70–99)
Potassium: 3.9 mmol/L (ref 3.5–5.1)
Sodium: 145 mmol/L (ref 135–145)
Total Bilirubin: 0.6 mg/dL (ref 0.3–1.2)
Total Protein: 5.4 g/dL — ABNORMAL LOW (ref 6.5–8.1)

## 2020-05-05 LAB — CBC WITH DIFFERENTIAL/PLATELET
Abs Immature Granulocytes: 0.1 10*3/uL — ABNORMAL HIGH (ref 0.00–0.07)
Basophils Absolute: 0 10*3/uL (ref 0.0–0.1)
Basophils Relative: 0 %
Eosinophils Absolute: 0.2 10*3/uL (ref 0.0–0.5)
Eosinophils Relative: 1 %
HCT: 33.6 % — ABNORMAL LOW (ref 39.0–52.0)
Hemoglobin: 10.9 g/dL — ABNORMAL LOW (ref 13.0–17.0)
Immature Granulocytes: 1 %
Lymphocytes Relative: 8 %
Lymphs Abs: 1.1 10*3/uL (ref 0.7–4.0)
MCH: 29.9 pg (ref 26.0–34.0)
MCHC: 32.4 g/dL (ref 30.0–36.0)
MCV: 92.1 fL (ref 80.0–100.0)
Monocytes Absolute: 1.2 10*3/uL — ABNORMAL HIGH (ref 0.1–1.0)
Monocytes Relative: 9 %
Neutro Abs: 10.7 10*3/uL — ABNORMAL HIGH (ref 1.7–7.7)
Neutrophils Relative %: 81 %
Platelets: 294 10*3/uL (ref 150–400)
RBC: 3.65 MIL/uL — ABNORMAL LOW (ref 4.22–5.81)
RDW: 13.7 % (ref 11.5–15.5)
WBC: 13.2 10*3/uL — ABNORMAL HIGH (ref 4.0–10.5)
nRBC: 0 % (ref 0.0–0.2)

## 2020-05-05 LAB — URINE CULTURE: Culture: >=100000 — AB

## 2020-05-05 LAB — GLUCOSE, CAPILLARY
Glucose-Capillary: 110 mg/dL — ABNORMAL HIGH (ref 70–99)
Glucose-Capillary: 123 mg/dL — ABNORMAL HIGH (ref 70–99)
Glucose-Capillary: 131 mg/dL — ABNORMAL HIGH (ref 70–99)
Glucose-Capillary: 136 mg/dL — ABNORMAL HIGH (ref 70–99)
Glucose-Capillary: 137 mg/dL — ABNORMAL HIGH (ref 70–99)
Glucose-Capillary: 150 mg/dL — ABNORMAL HIGH (ref 70–99)

## 2020-05-05 LAB — PROTIME-INR
INR: 1.6 — ABNORMAL HIGH (ref 0.8–1.2)
Prothrombin Time: 18.4 seconds — ABNORMAL HIGH (ref 11.4–15.2)

## 2020-05-05 LAB — PHOSPHORUS: Phosphorus: 3.9 mg/dL (ref 2.5–4.6)

## 2020-05-05 LAB — MAGNESIUM: Magnesium: 1.9 mg/dL (ref 1.7–2.4)

## 2020-05-05 IMAGING — DX DG CHEST 1V PORT
1 series · 1 of 1 positions shown · non-contrast
Comparison: CT chest, abdomen and pelvis and single view of the
chest [DATE].

CLINICAL DATA: Shortness of breath.

EXAM:
PORTABLE CHEST 1 VIEW

[chest]
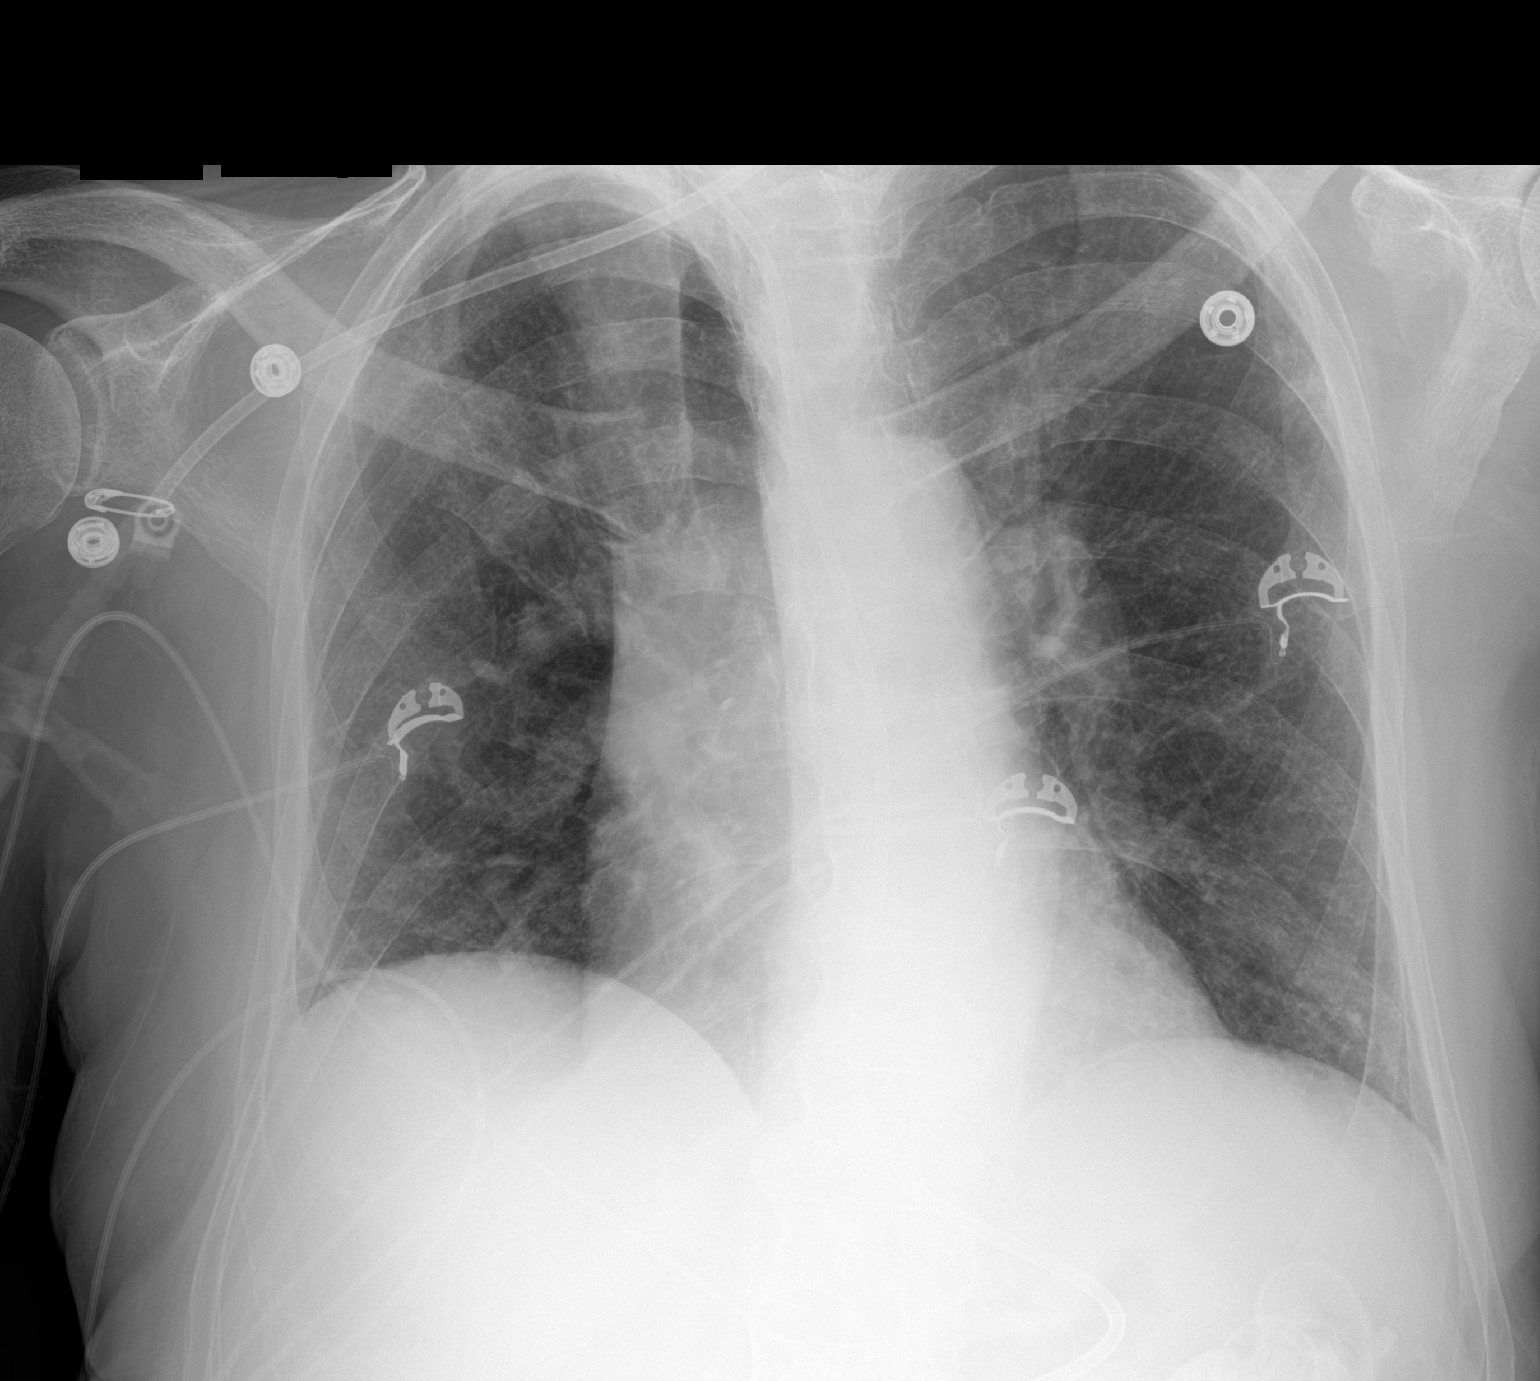

[1 of 1 positions shown; findings below may reference images not displayed]

FINDINGS: Lungs are clear. Heart size is normal. No pneumothorax or pleural
effusion.
IMPRESSION: No acute disease.

## 2020-05-05 IMAGING — US US SCROTUM
1 series · 13 of 25 positions shown · non-contrast
Comparison: None.

CLINICAL DATA: 79-year-old with scrotal swelling.

EXAM:
ULTRASOUND OF SCROTUM
TECHNIQUE: Complete ultrasound examination of the testicles, epididymis, and
other scrotal structures was performed.

[Series 1: us scrotum · 13 of 91 slices shown]
[im 1/91]
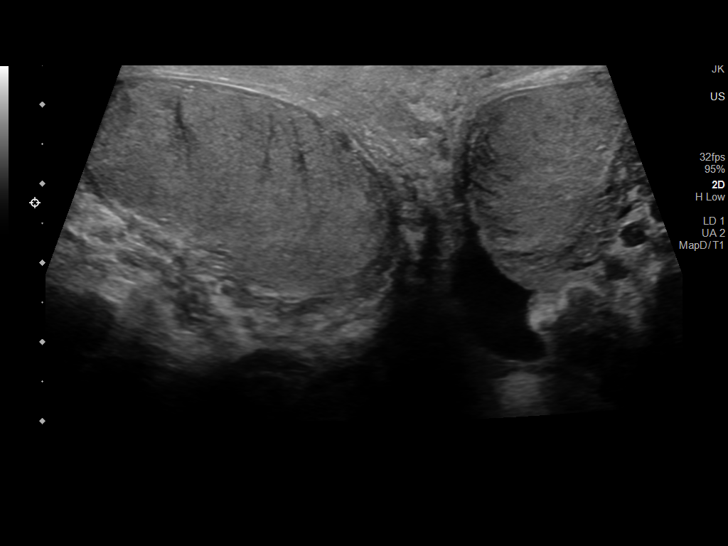
[im 8/91]
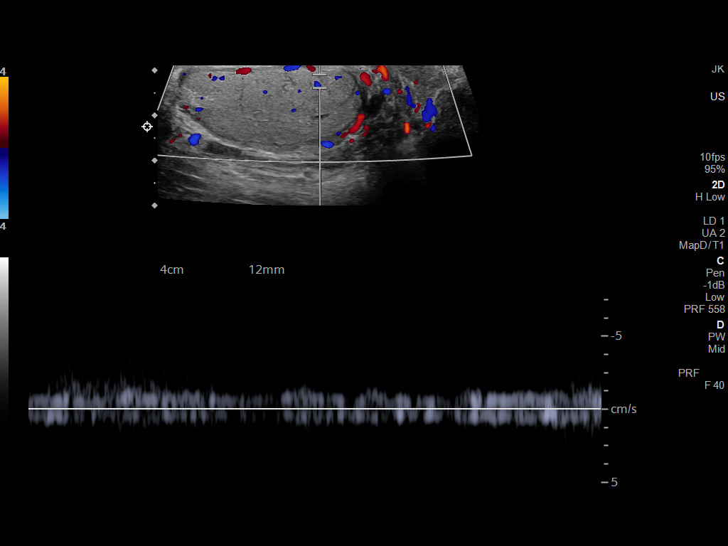
[im 16/91]
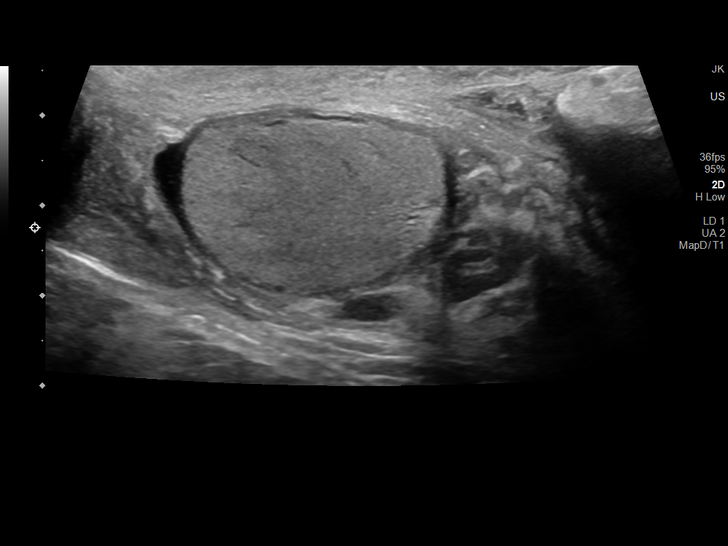
[im 23/91]
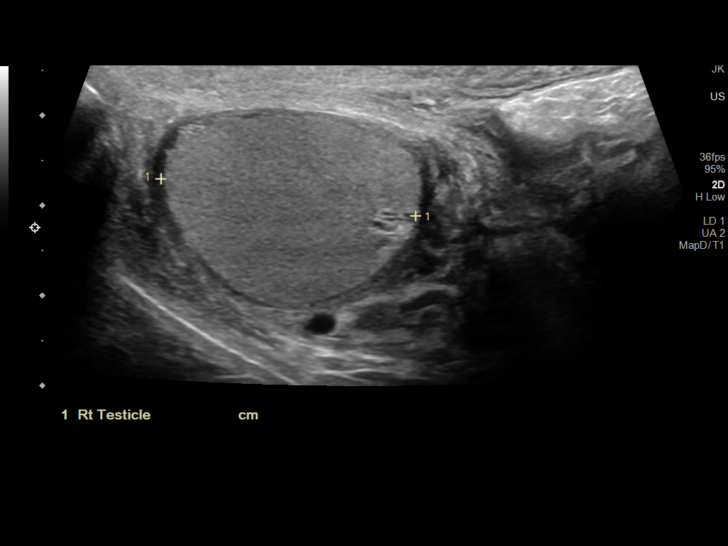
[im 31/91]
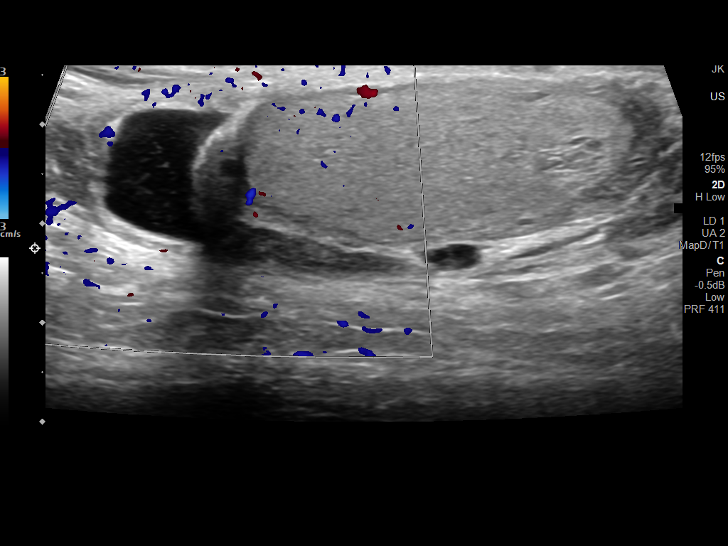
[im 38/91]
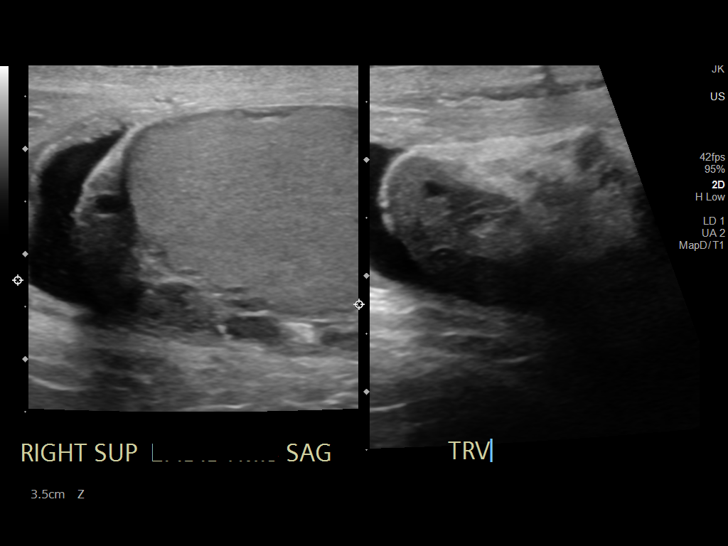
[im 46/91]
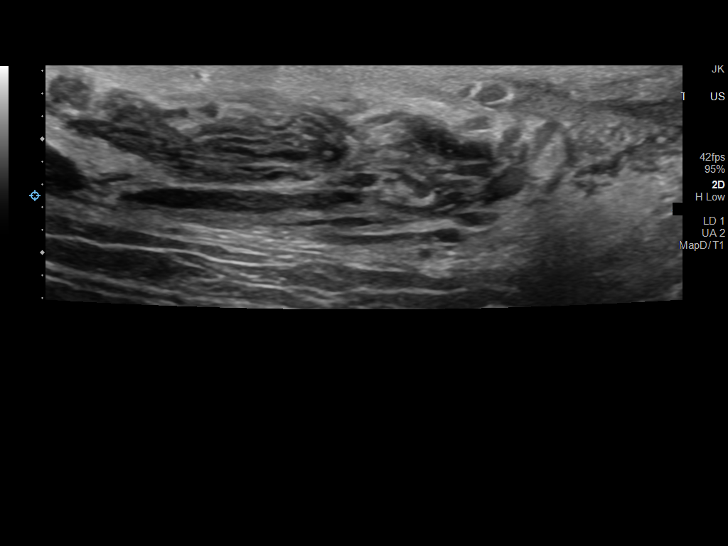
[im 53/91]
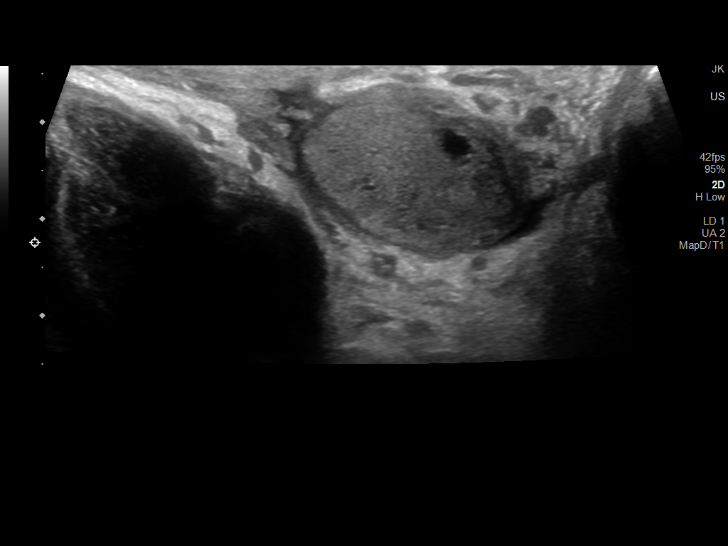
[im 61/91]
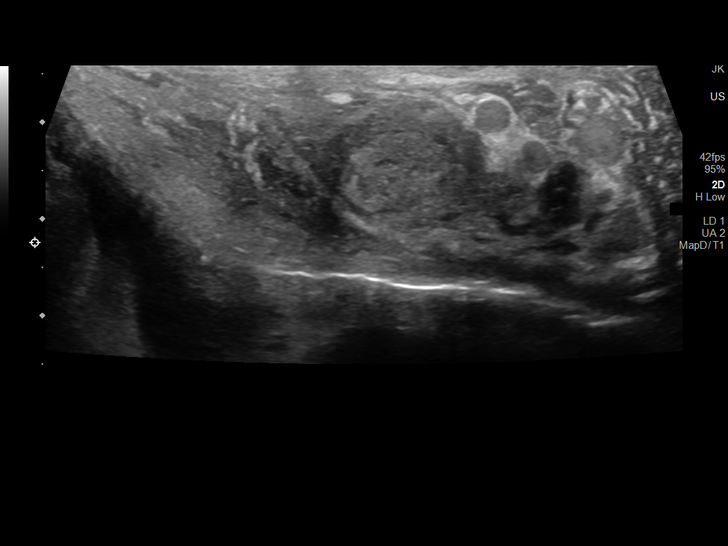
[im 68/91]
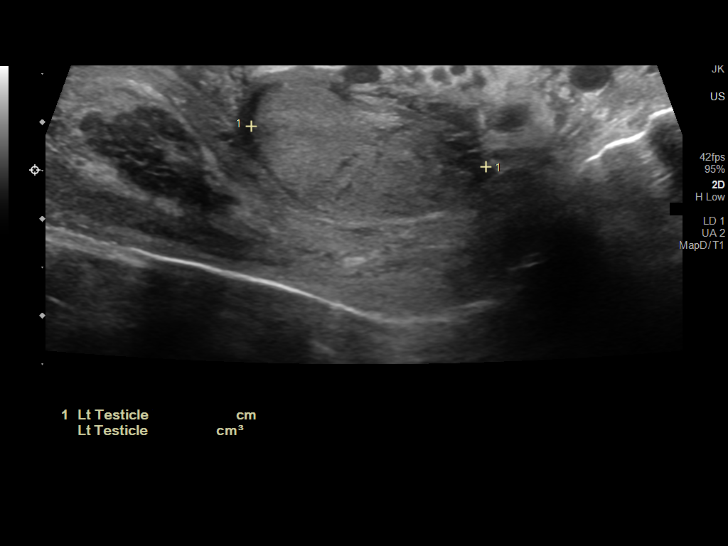
[im 76/91]
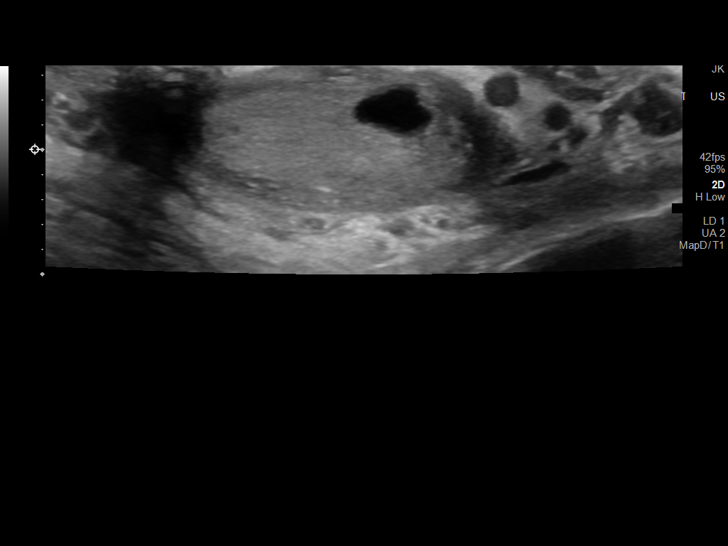
[im 83/91]
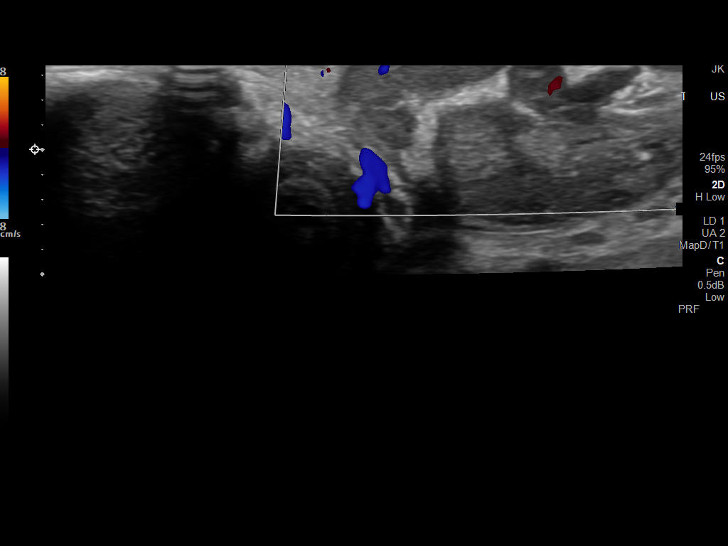
[im 91/91]
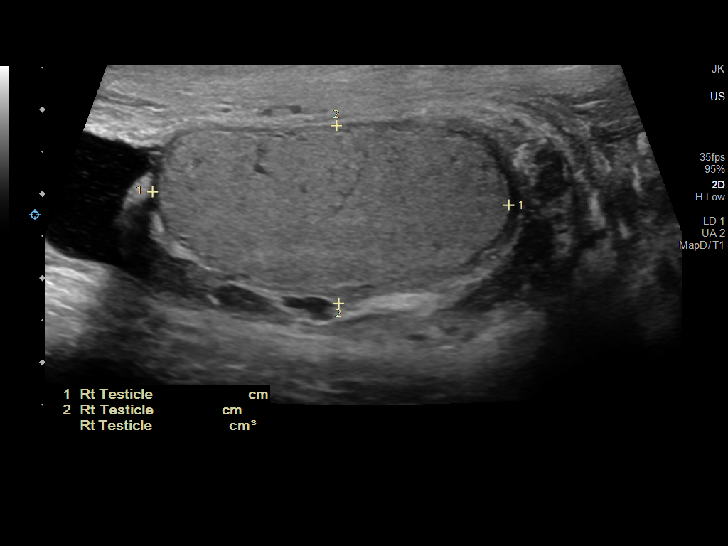

[13 of 25 positions shown; findings below may reference images not displayed]

FINDINGS: Right testicle

Measurements: 4.2 x 2.1 x 2.9 cm. Rete tubular ectasia with small
cysts at the mediastinum testis. No solid intra testicular mass.
Normal blood flow.

Left testicle

Measurements: 3.3 x 1.7 x 2.5 cm. Testicular echotexture is
heterogeneous. Ill-defined area of heterogeneously decreased
echogenicity in the lateral testis without well-defined borders
measures approximately 1.2 x 0.8 x 1.3 cm. Similar internal
vascularity to the adjacent testicular parenchyma. There is an
intratesticular cyst laterally measuring 6 mm. Normal parenchymal
blood flow.

Right epididymis: Tiny epididymal head cyst. There is also a tiny
cyst in the mid epididymal body. No solid lesion or hyperemia.

Left epididymis:  Epididymal head cyst measuring 8 mm.

Hydrocele:  Small bilaterally, right greater than left.

Varicocele:  Present bilaterally.

Other: Bilateral scrotal skin thickening. No subcutaneous
collection.
IMPRESSION: 1. Bilateral scrotal skin thickening.
2. Right rete tubular ectasia.
3. There is an intra testicular ill-defined heterogeneously
hypoechoic area in the left lateral testicle measuring approximately
1.3 cm with some internal blood flow. This has nonspecific imaging
features. Differential considerations include unusual appearance of
rete testis versus intra testicular mass. Presence of internal blood
flow favors against testicular hematoma or segmental infarct.
Recommend urologic consultation and potentially MRI for more
detailed evaluation. There is also a subcentimeter left intra
testicular cyst.
4. Small bilateral hydroceles.  Bilateral varicoceles.
5. Bilateral epididymal head cysts.

## 2020-05-05 IMAGING — MR MR HEAD W/O CM
8 of 16 series · 26 of 48 positions shown · non-contrast
Comparison: [DATE] and prior.

CLINICAL DATA: Stroke, follow up

EXAM:
MRI HEAD WITHOUT CONTRAST
TECHNIQUE: Multiplanar, multiecho pulse sequences of the brain and surrounding
structures were obtained without intravenous contrast.

[Series 9: DWI · axial · 3.0mm · 0.88mm/px · z∈[-20,+120]mm · 5 of 95 slices shown (1 of 4)]
[im 1/95]
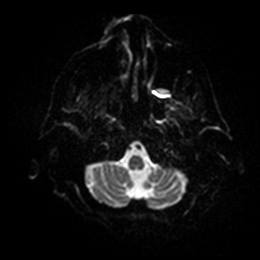
[im 24/95]
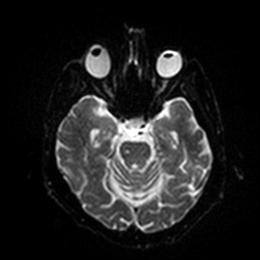
[im 48/95]
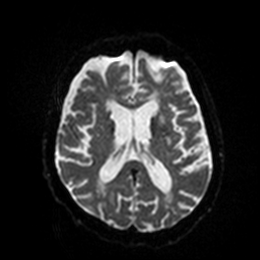
[im 71/95]
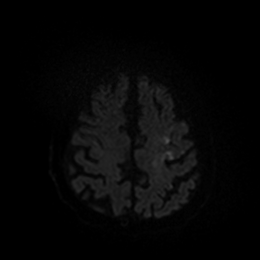
[im 95/95]
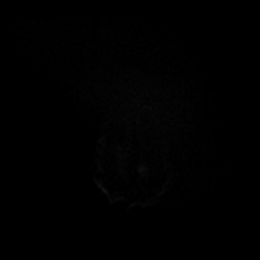

[Series 10: DWI · axial · 3.0mm · 0.88mm/px · z∈[-20,+120]mm · 3 of 48 slices shown (2 of 4)]
[im 1/48]
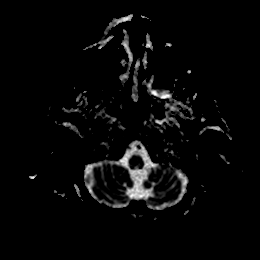
[im 24/48]
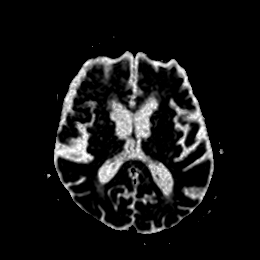
[im 48/48]
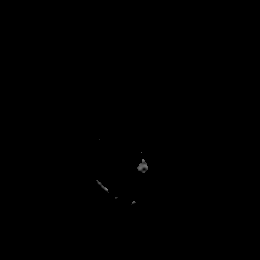

[Series 11: DWI · coronal · 4.0mm · 0.88mm/px · 4 of 64 slices shown (3 of 4)]
[im 1/64]
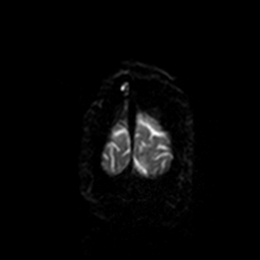
[im 22/64]
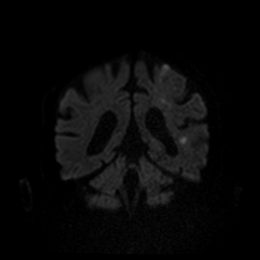
[im 43/64]
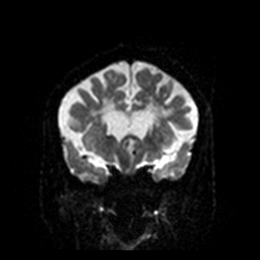
[im 64/64]
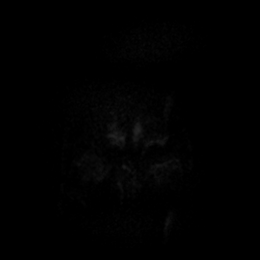

[Series 12: DWI · coronal · 4.0mm · 0.88mm/px · 2 of 32 slices shown (4 of 4)]
[im 1/32]
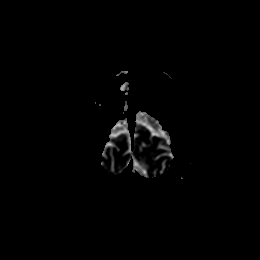
[im 32/32]
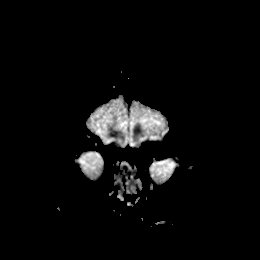

[Series 13: mag_images · axial · 3.0mm · 0.90mm/px · z∈[-35,+118]mm · 3 of 52 slices shown]
[im 1/52]
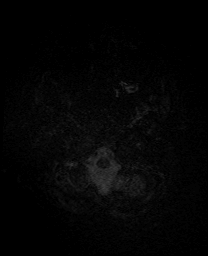
[im 26/52]
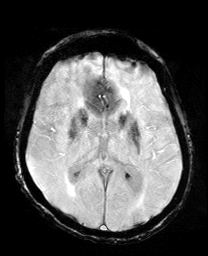
[im 52/52]
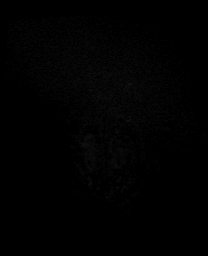

[Series 14: pha_images · axial · 3.0mm · 0.90mm/px · z∈[-35,+118]mm · 3 of 52 slices shown]
[im 1/52]
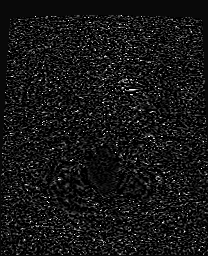
[im 26/52]
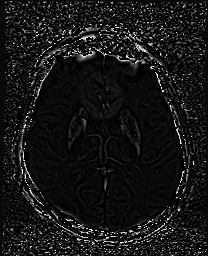
[im 52/52]
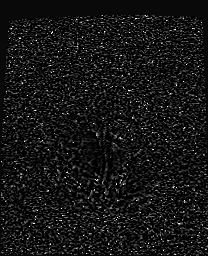

[Series 15: swi_images · axial · 3.0mm · 0.90mm/px · z∈[-35,+118]mm · 3 of 52 slices shown]
[im 1/52]
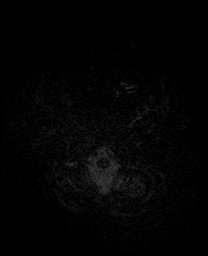
[im 26/52]
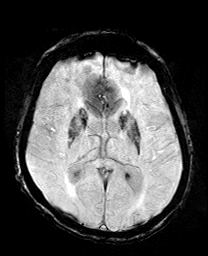
[im 52/52]
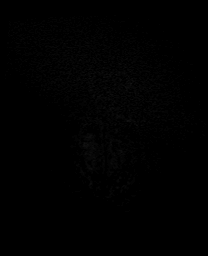

[Series 16: mip_images(sw) · axial · 24.0mm · 0.90mm/px · z∈[-24,+107]mm · 3 of 45 slices shown]
[im 1/45]
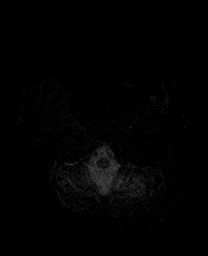
[im 23/45]
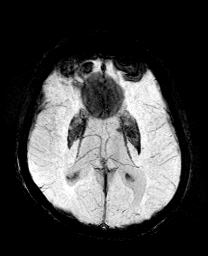
[im 45/45]
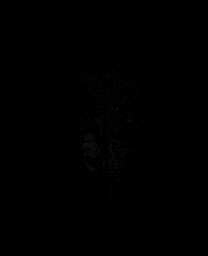

[26 of 48 positions shown; findings below may reference images not displayed]

FINDINGS: Please note that limited sequence acquisition limits evaluation.

Brain: Mild cerebral atrophy with ex vacuo dilatation. No midline
shift, ventriculomegaly or extra-axial fluid collection. No
intracranial hemorrhage.

Redemonstration of multifocal subacute infarcts involving the left
cerebrum, unchanged. Tiny DWI hyperintensity involving the right
caudate head on prior exam is not demonstrated. No new
acute/subacute infarct. Multifocal remote insults are better
demonstrated on prior exam.

Vascular: Not well evaluated.

Skull and upper cervical spine: No focal abnormality.

Sinuses/Orbits: Not well evaluated.

Other: None.
IMPRESSION: Multifocal subacute left cerebral infarcts, unchanged.

No new infarct or intracranial hemorrhage.

Prior punctate right caudate head DWI hyperintensity is not
demonstrated.

## 2020-05-05 MED ORDER — FREE WATER
175.0000 mL | Status: DC
  Administered 2020-05-05 – 2020-05-06 (×6): 175 mL

## 2020-05-05 MED ORDER — JEVITY 1.5 CAL/FIBER PO LIQD
1000.0000 mL | ORAL | Status: DC
  Administered 2020-05-06 – 2020-05-25 (×13): 1000 mL
  Filled 2020-05-05 (×31): qty 1000

## 2020-05-05 MED ORDER — IPRATROPIUM BROMIDE 0.02 % IN SOLN: 0.5000 mg | Freq: Four times a day (QID) | RESPIRATORY_TRACT | Status: DC | PRN

## 2020-05-05 MED ORDER — PROSOURCE TF PO LIQD
45.0000 mL | Freq: Two times a day (BID) | ORAL | Status: DC
  Administered 2020-05-05 – 2020-05-25 (×40): 45 mL
  Filled 2020-05-05 (×41): qty 45

## 2020-05-05 MED ORDER — WARFARIN SODIUM 2.5 MG PO TABS
2.5000 mg | ORAL_TABLET | Freq: Once | ORAL | Status: AC
  Administered 2020-05-05: 20:00:00 2.5 mg
  Filled 2020-05-05: qty 1

## 2020-05-05 MED ORDER — WARFARIN SODIUM 2.5 MG PO TABS
2.5000 mg | ORAL_TABLET | Freq: Once | ORAL | Status: DC
  Filled 2020-05-05: qty 1

## 2020-05-05 NOTE — Progress Notes (Signed)
Physical Therapy Treatment Patient Details Name: John Lambert MRN: [id number] DOB: 1940-08-12 Today's Date: 05/05/2020    History of Present Illness John Lambert is a 79 y.o. male admitted 12/14 with bil LE weakness and difficulty walking as well as left hip pain. Imaging notable for Numerous scattered punctate acute infarctions in the left  posterior frontal region consistent with micro embolic infarctions. Punctate acute infarction also affecting the splenium of  the corpus callosum.  Pt with L carotid artery stenosis s/p stent on 04/27/20. Post procedure pt with right hemiparesis, aphasia and left gaze with intraprocedural stroke. Code stroke called with CT head (-) for acute findings. Emergent L carotid diagnostic cerebral cath angiogram (-) for acute findings. MRI 12/23 (+) patchy left basal ganglia as well as left frontoparietal cortical and subcortical infarcts. 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    PT Comments    Pt with improved sitting balance this date, progressing to increased periods of only minA and short bouts of min guard assist. However, as pt fatigued he required mod-maxA to maintain his balance. He displayed decreased posterior lean with him actually leaning anteriorly this date. Pt continues to display pushing with L hand towards R resulting in a R lateral lean and LOB. Thus, focused on decreasing pushing through removing feet support sitting EOB and progressing from sidelying on L elbow > L hand placed distal to body sitting up > L hand more proximal to body, with success noted. Continued to utilize mirror for visual feedback sitting EOB. Pt continues to require maxAx2 for all functional mobility with min-no activation noted in R limbs. Pt rolled to L with pillows under R trunk at end of session to provide pressure relief. Will continue to follow acutely. Current recommendations remain appropriate.  Follow Up  Recommendations  SNF;Supervision/Assistance - 24 hour     Equipment Recommendations  Wheelchair (measurements PT);Wheelchair cushion (measurements PT);Hospital bed (mechanical lift all if d/c home)    Recommendations for Other Services       Precautions / Restrictions Precautions Precautions: Fall Precaution Comments: cortrak, R hemi Restrictions Weight Bearing Restrictions: No    Mobility  Bed Mobility Overal bed mobility: Needs Assistance Bed Mobility: Rolling;Supine to Sit;Sit to Supine Rolling: Max assist;+2 for physical assistance   Supine to sit: HOB elevated;Max assist;+2 for physical assistance;+2 for safety/equipment Sit to supine: Max assist;+2 for physical assistance;+2 for safety/equipment;HOB elevated   General bed mobility comments: Multi-modal single step commands for all bed mob with min assistance/initiation from pt thus maxAx2 to manage legs and trunk to come to sit EOB. MaxAx2 to rreturn to supine. Rolling to L with maxAx2, placing R foot on bed and assisting with R arm reach across body. Roll to R with minAx2 with pt pulling on therapist's hand with L hand and pushing off L foot when cued.  Transfers                 General transfer comment: Deferred this date.  Ambulation/Gait             General Gait Details: Deferred due to safety concerns.   Stairs             Wheelchair Mobility    Modified Rankin (Stroke Patients Only) Modified Rankin (Stroke Patients Only) Pre-Morbid Rankin Score: No significant disability Modified Rankin: Severe disability     Balance Overall balance assessment: Needs assistance Sitting-balance support: Single extremity supported;Bilateral upper extremity  supported;Feet supported;Feet unsupported Sitting balance-Leahy Scale: Poor Sitting balance - Comments: Pt pushes with L hand towards his R resulting in R lateral lean. Pt unable to detect which direction he felt he was falling. Provided visual  feedback through use of mirror to encourage posture correction and midline orientation, with mod success. To decrease pushing, placed pt in L sidelying on L elbow with feet off floor sitting EOB and minA. Progressed to L hand distal to body sitting EOB feet unsupported > supported then hand more proximal to body. Decreased pushing noted, thus success. Pt required minA with short bouts of min guard assist as pushing decreased but then required modA and int maxA as pt fatigued to sit EOB. Postural control: Posterior lean;Right lateral lean     Standing balance comment: Deferred this date.                            Cognition Arousal/Alertness: Lethargic Behavior During Therapy: Flat affect Overall Cognitive Status: Impaired/Different from baseline Area of Impairment: Attention;Following commands;Problem solving;Safety/judgement;Memory;Awareness                   Current Attention Level: Alternating Memory: Decreased short-term memory Following Commands: Follows one step commands inconsistently;Follows one step commands with increased time Safety/Judgement: Decreased awareness of safety;Decreased awareness of deficits Awareness: Emergent Problem Solving: Slow processing;Decreased initiation;Difficulty sequencing;Requires verbal cues;Requires tactile cues General Comments: Pt lethargic, opening eyes only brifly when cued during session. Maintained eyes open more sitting EOB. Pt unaware of deficits and his physical responses placing him at risk for falls. Pt felt as if he were falling but unable to detect which direction. Required repeated multi-modal single step commands, with inconsistent responses.      Exercises      General Comments General comments (skin integrity, edema, etc.): Redness noted at bilat inferior buttocks/superior posterior thighs      Pertinent Vitals/Pain Pain Assessment: Faces Faces Pain Scale: Hurts whole lot Pain Location: scrotum; head Pain  Descriptors / Indicators: Discomfort;Grimacing;Guarding Pain Intervention(s): Limited activity within patient's tolerance;Monitored during session;Repositioned    Home Living                      Prior Function            PT Goals (current goals can now be found in the care plan section) Acute Rehab PT Goals Patient Stated Goal: did not state but pt's wife stated goal to improve PT Goal Formulation: With patient/family Time For Goal Achievement: 05/13/20 Potential to Achieve Goals: Fair Progress towards PT goals: Progressing toward goals    Frequency    Min 3X/week      PT Plan Current plan remains appropriate    Co-evaluation              AM-PAC PT "6 Clicks" Mobility   Outcome Measure  Help needed turning from your back to your side while in a flat bed without using bedrails?: A Lot Help needed moving from lying on your back to sitting on the side of a flat bed without using bedrails?: A Lot Help needed moving to and from a bed to a chair (including a wheelchair)?: Total Help needed standing up from a chair using your arms (e.g., wheelchair or bedside chair)?: A Lot Help needed to walk in hospital room?: Total Help needed climbing 3-5 steps with a railing? : Total 6 Click Score: 9    End of Session  Activity Tolerance: Patient limited by lethargy;Patient limited by fatigue;Patient limited by pain Patient left: in bed;with call bell/phone within reach;with bed alarm set;with family/visitor present Nurse Communication: Mobility status;Other (comment) (scrotum swelling and redness at buttocks) PT Visit Diagnosis: Muscle weakness (generalized) (M62.81);History of falling (Z91.81);Unsteadiness on feet (R26.81);Other abnormalities of gait and mobility (R26.89);Difficulty in walking, not elsewhere classified (R26.2);Other symptoms and signs involving the nervous system (R29.898);Hemiplegia and hemiparesis Hemiplegia - Right/Left: Right Hemiplegia - caused by:  Cerebral infarction     Time: [id number] PT Time Calculation (min) (ACUTE ONLY): 36 min  Charges:  $Therapeutic Activity: 8-22 mins $Neuromuscular Re-education: 8-22 mins                     Moishe Spice, PT, DPT Acute Rehabilitation Services  Pager: [phone] Office: [phone]    Orvan Falconer 05/05/2020, 12:04 PM

## 2020-05-05 NOTE — Progress Notes (Signed)
Nutrition Follow-up  DOCUMENTATION CODES:   Not applicable  INTERVENTION:  Continue TF via Cortrak: -Transition to Jevity 1.5 @ 26ml/hr (1367ml/d) -38ml Prosource TF BID -122ml free water Q4H   Provides 2060 kcal, 106 gm protein, 1003 ml free water daily (2034ml total free water with flushes).  NUTRITION DIAGNOSIS:   Inadequate oral intake related to dysphagia as evidenced by NPO status.  ongoing  GOAL:   Patient will meet greater than or equal to 90% of their needs  Met with TF  MONITOR:   TF tolerance,Labs,Diet advancement  REASON FOR ASSESSMENT:   Rounds    ASSESSMENT:   79 yo male admitted with BLE weakness and difficulty walking; left carotid artery stenosis. 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  12/22 s/p L TCAR 12/24 Cortrak placed (gastric) 12/27 Cortrak replaced (gastric)  Pt continues to receive TF via Cortrak. Yesterday, RN paged RD stating that pt was coughing a lot but cough was mostly resolved when RN held TF. CT scan performed, negative for aspiration PNA and showed Cortrak in stomach. RN reported no other signs of intolerance to TF and stated that previous RNs had not noted any intolerance. Suspect coughing is 2/2 irritation from the tube itself as there have been no other reports of pt exhibiting issues with tolerance. RN today confirms that TF has been resumed with minimal coughing noted. RN states pt tolerating TF well. Will add free water flushes and adjust orders to provide less volume from TF.  Current TF regimen: Jevity 1.2 @ 72ml/hr, 14ml Prosource TF daily  Note pt may need PEG if unable to pass swallow eval and if aligned with GOC.   UOP: 1244ml documented today  Labs: Cr 1.81 (H, down from yesterday), CBGs 110-150 Medications: ss novolog Q4H, coumadin  Diet Order:   Diet Order    None      EDUCATION NEEDS:   Not  appropriate for education at this time  Skin:  Skin Assessment: Skin Integrity Issues: Skin Integrity Issues:: Incisions Incisions: groin and neck  Last BM:  12/28 type 6  Height:   Ht Readings from Last 1 Encounters:  04/20/20 $RemoveB'6\' 2"'GFjhvyih$  (1.88 m)    Weight:   Wt Readings from Last 1 Encounters:  05/05/20 85.5 kg    Ideal Body Weight:  86.4 kg  BMI:  Body mass index is 24.2 kg/m.  Estimated Nutritional Needs:   Kcal:  2000-2200  Protein:  105-125 gm  Fluid:  >/= 2 L    Larkin Ina, MS, RD, LDN RD pager number and weekend/on-call pager number located in [place].

## 2020-05-05 NOTE — TOC Progression Note (Signed)
Transition of Care [facility]) - Progression Note    Patient Details  Name: John Lambert MRN: [id number] Date of Birth: [birth year]  [facility] CM/SW Contact  Mearl Latin, LCSW Phone Number: 05/05/2020, 9:03 AM  Clinical Narrative:    CSW continuing to follow for SNF once medically appropriate.    Expected Discharge Plan: Skilled Nursing Facility Barriers to Discharge: Insurance Authorization,Continued Medical Work up,SNF Pending bed offer  Expected Discharge Plan and Services Expected Discharge Plan: Skilled Nursing Facility In-house Referral: Clinical Social Work Discharge Planning Services: CM Consult Post Acute Care Choice: Skilled Nursing Facility Living arrangements for the past 2 months: Single Family Home                                       Social Determinants of Health (SDOH) Interventions    Readmission Risk Interventions No flowsheet data found.

## 2020-05-05 NOTE — Progress Notes (Signed)
EEG complete - results pending 

## 2020-05-05 NOTE — Progress Notes (Addendum)
Speech Language Pathology Treatment: Dysphagia  Patient Details Name: John Lambert MRN: [id number] DOB: 07/09/1940 Today's Date: 05/05/2020 Time: 8527-7824 SLP Time Calculation (min) (ACUTE ONLY): 14 min  Assessment / Plan / Recommendation Clinical Impression  Pt was seen for dysphagia treatment with his wife and son present. Pt was adequately alert for participation but his level of alertness waned as the session progressed and the session was ultimately discontinued due to lethargy. Pt responded to simple yes/no questions with 80% accuracy with self-correction and additional processing time. He requested that expressive language tasks be deferred due to his fatigue. Pt demonstrated throat clearing and coughing with 1/2 tsp boluses of puree solids and honey thick liquids. Three to four secondary swallows were noted despite reduced bolus sizes, suggesting pharyngeal residue. Frequency of signs of aspiration were reduced, but not eliminated with use of a left head turn posture, indicating reduced effectiveness of compensatory strategies. Pt's wife reported that the pt had worked with PT earlier and was fatigued from that and the EEG. The impact of this on his performance is considered; however, his tolerance of modified consistencies appears worse than during the modified barium swallow study of 12/29. Pt's breathing appeared effortful and pt's wife reported that this is how he has been today. P.o. snacks of puree solids and nectar thick liquids via tsp are not recommended at this time. However, pt may have limited (i.e., 1-3) ice chips following thorough oral care. Considering the severity of his impairments, SLP anticipates that long-term nonoral alimentation (e.g., G-tube) will be necessitated; Dr. Marland Mcalpine was advised of this and indicated that he has discussed this with the family as well. SLP will continue to follow pt.    HPI HPI: Pt is an 79 y.o. male with medical history significant for HTN,  HLD, blood clotting disorder with hx of PE and DVT, on chronic anticoagulation with coumadin who presents with complaint of weakness in legs and difficulty walking. He also has pain in left upper leg/hip region, and R foot drag; Imaging notable for Numerous scattered punctate acute infarctions in the left  posterior frontal region consistent with micro embolic infarctions  which could either be in the anterior or middle cerebral artery  territory. Punctate acute infarction also affecting the splenium of  the corpus callosum. Pt was seen by SLP on 12/17 for a BSE and SLE with no subsequent need for SLP services. Left carotid TCAR procedure with stent placement conducted on 12/22. Following the procedure he was found to be aphasic unable to speak with left gaze deviation and right hemiparesis. Emergent CTA showed patent left carotid stent with good flow in the terminal left ICA as well as middle cerebral artery in the M1 segment. There appeared to be decreased flow in one of the M2 branches by neurology. SLP was consulted due to pt failing the [facility] Screen, but a failed swallow screen has not been documented in EMR. EEG 12/22: No seizures. MRI 12/23: New areas of acute infarction are present scattered within the  left basal ganglia and radiating white matter tracts and within the  left frontoparietal cortical and subcortical brain.EEG 12/30: cortical dysfunction in left hemisphere, maximal left fronto-temporal region likely secondary to underlying stroke/structural abnormality. No seizures      SLP Plan  Continue with current plan of care       Recommendations  Diet recommendations: NPO (discontinue allowance of p.o. snacks; pt may have limited (1-3) individual ice chips following thorough oral care) Medication Administration: Via alternative  means                Oral Care Recommendations: Oral care QID Follow up Recommendations: Inpatient Rehab SLP Visit Diagnosis: Dysphagia, oropharyngeal  phase (R13.12) Plan: Continue with current plan of care       Adrien Dietzman I. Hardin Negus, [place], [place] Office number [phone] Pager [place] 05/05/2020, 2:37 PM

## 2020-05-05 NOTE — Progress Notes (Signed)
ANTICOAGULATION CONSULT NOTE  Pharmacy Consult for Warfarin Indication: on PTA warfarin for hx of DVT/PE, new acute stroke, new Afib  Patient Measurements: Height: 6\' 2"  (188 cm) Weight: 85.5 kg (188 lb 7.9 oz) IBW/kg (Calculated) : 82.2   Vital Signs: Temp: 98.9 F (37.2 C) (12/30 0744) Temp Source: Oral (12/30 0744) BP: 116/68 (12/30 0744) Pulse Rate: 97 (12/30 0744)  Labs: Recent Labs    05/03/20 0430 05/03/20 1310 05/04/20 0225 05/04/20 1326 05/05/20 0302  HGB 10.4*  --  10.2* 10.8* 10.9*  HCT 32.2*  --  31.2* 34.2* 33.6*  PLT 233  --  256 271 294  LABPROT 25.9*  --  20.6*  --  18.4*  INR 2.5*  --  1.8*  --  1.6*  HEPARINUNFRC 0.31  --   --   --   --   CREATININE 1.40*   < > 1.79* 2.12* 1.81*  CKTOTAL 124  --   --   --   --    < > = values in this interval not displayed.    Estimated Creatinine Clearance: 37.2 mL/min (A) (by C-G formula based on SCr of 1.81 mg/dL (H)).  Assessment: 79 yo male with L symptomatic carotid stenosis.  S/p L carotid TCAR procedure w/ stenting that was complicated by postprocedure stroke.  MRI brain shows patchy L basal ganglia and L frontoparietal cortical and subcortical infarcts (new).  No hemorrhagic transformation. New stroke and new onset atrial fibrillation.    Pharmacy consulted to resume warfarin.  Heparin infusion discontinued on 05/03/20.   Today's INR is 1.6 subtherapeutic, down from INR of  2.5 on 05/03/20.  Hematuria noted yesterday morning. RN reported it appeared to be dark old blood.  RN reports that urine is now clear, no bleeding currently.  I discussed anticoagulation plans with Dr. 05/05/20 this morning and I was instructed to continue warfarin.   PTA warfarin dose: 2.5mg  daily except 1.25mg  on Mondays  Hgb has downtrended from 16 to 10 over 1 week, 10.9 this morning/stable. Platelets are stable, normal in the 200s.   Goal of Therapy:   INR goal 2-3  Monitor platelets by anticoagulation protocol: Yes      Plan: Warfarin 2.5mg  PO x 1 today Monitor daily INR, CBC, s/s bleeding Monitor hematuria   Ravon Mortellaro A. 08-03-1975, PharmD, BCPS, FNKF Clinical Pharmacist [place] Please utilize Amion for appropriate phone number to reach the unit pharmacist [facility])   05/05/2020 10:30 AM

## 2020-05-05 NOTE — Procedures (Addendum)
Patient Name: John Lambert  MRN: [id number]  Epilepsy Attending: Charlsie Quest  Referring Physician/Provider: Dr Marvel Plan Date: 05/05/2020 Duration: 24.53 mins  Patient history: 79yo M with left side stroke. EEG to evaluate for seizure  Level of alertness: Awake, asleep   AEDs during EEG study: None  Technical aspects: This EEG study was done with scalp electrodes positioned according to the 10-20 International system of electrode placement. Electrical activity was acquired at a sampling rate of 500Hz  and reviewed with a high frequency filter of 70Hz  and a low frequency filter of 1Hz . EEG data were recorded continuously and digitally stored.   Description: The posterior dominant rhythm consists of 8 Hz activity of moderate voltage (25-35 uV) seen predominantly in posterior head regions, symmetric and reactive to eye opening and eye closing.Sleep was characterized by vertex waves, maximal frontocentral region EEG showed intermittent 3-5hz  theta-delta slowing in left hemisphere, maximal left frontotemporal region. Hyperventilation and photic stimulation were not performed.   ABNORMALITY -Intermiitent slow, left hemisphere, maximal left frontotemporal region  IMPRESSION: This study is suggestive of cortical dysfunction in left hemisphere, maximal left fronto-temporal region likely secondary to underlying stroke/structural abnormality. No seizures or epileptiform discharges were seen throughout the recording.  EEG appears to be improving to previous study.   Diya Gervasi 

## 2020-05-05 NOTE — Progress Notes (Signed)
PROGRESS NOTE    John Lambert  MGQ:[id number] DOB: 1941-01-02 DOA: 04/19/2020 PCP: Laurann Montana, MD  Brief Narrative:  The patient is an 79 y.o.malewith PMH significant for hypertension, melanoma, neurofibroma, clotting disorder, history of DVT and PE on chronic Coumadinwhopresented to the ED on 12/14 with c/o : bilateral lower extremity weakness and difficulty ambulating associated with pain in the left hip region.(has a history of neurofibroma tumor removal from his spine about 4 years ago and melanoma removal from right calf about 30 years ago.) MRI brain obtainednoted numerousscattered punctate acute infarctions in the left posterior frontal region consistent with micro embolic infarctions which could either be in the anterior or middle cerebral arteryterritory. Punctate acute infarction also affecting the splenium ofthe corpus callosum.Extensive chronic small-vessel ischemic changes elsewherethroughout the brain.Small old left parietal vertex cortical and subcorticalInfarctions.  MRI lumbar spine showedchronicarachnoiditis with adhesions in the thecal sac and distorted cauda equina nerve roots from L2-L3 to the sacrum, but no evidence ofrecurrent spinal tumor.Possible right L5/S1 radiculitis after noting L4/5 multifactorial mild to moderate spinal stenosis. Vascular surgery was consulted and took patient on 12/22 for a left transcarotid artery stenting. Patient found to have 80% carotid stenosis and then following procedure down to less than 10% with stent. However, following surgery patient noted to have difficulty moving right side expressive aphasia and left gaze deviation and code stroke immediately called. Patient taken straight to CT scan. No new acute infarction noted. Patient underwent emergent left carotid diagnostic cerebral catheter angiogram with left MCA embolism noted from previous, but no acute findings. No intervention planned. Patient sent to medical ICU in  the absence of beds in the neuro ICU.EEG noted cortical dysfunction left hemisphere, but no seizures or epileptiform discharges seen throughout recording.  He was transferred to the stroke service on 04/27/2020 and then transferred out to the [facility] service on 04/29/2020.  Currently he continues to remain weak and his right upper arm is flaccid.  Now having gross hematuria as he has been placed on aspirin Plavix as well as heparin and Coumadin.  Heparin drip has now been stopped given that his Coumadin level therapeutic.  Next Coumadin will be held given his gross hematuria.  Renal function continues to worsen slightly.  Will need to continue monitor continue IV fluid hydration.  Will discuss with nephrology if renal function continues to worsen.  Case was discussed with urology Dr. Sebastian Ache who recommends obtaining baseline imaging with a CT of the abdomen pelvis with and without contrast for hematuria.  Unfortunately imaging was not able to be obtained given his worsened renal function so this was done without contrast.  This showed possible pyelonephritis and pancreatitis was essentially ruled out given his normal lipase level.  Foley catheter was inserted yesterday and urine was sent off and grew out Klebsiella and we have placed him on antibiotics and his WBC is improving.  Renal function is improving now with Foley catheter drainage and he is diuresing fairly well.  Patient WBC is trending down as well as his renal function.  We will resume his anticoagulation on 05/05/2020.  Patient underwent CT scan of his chest abdomen pelvis yesterday.  He is little bit more somnolent today still case was discussed with neurology who ordered a repeat MRI which was unchanged and the EEG which still showed encephalopathy which was improved.  We will continue to monitor patient's clinical response closely.  Family today was concerned with some scrotal edema and erythema so his scrotum was elevated  and will obtain  scrotal ultrasound.  Likely this is in the setting of his anasarca and extreme volume overload  Assessment & Plan:   Principal Problem:   Weakness Active Problems:   Anticoagulated on warfarin   Impaired ambulation   Fall at home, initial encounter   Essential hypertension   Blood clotting disorder ([place])   Cerebral embolism with cerebral infarction   Stroke [facility])   Middle cerebral artery embolism, left  Acute CVA on presentation from left carotid artery stenosis status post left carotid stenting with complications with  new stroke.   -He underwent elective left carotid transcranial procedure with stenting complicated by postop stroke affecting his speech he is completely aphasic and now with dense right hemiplegia. -He had an emergent CT angiogram and cerebral catheter angiogram which did not reveal large vessel occlusion. -EEG did not show any epileptiform activities. -MRI of the brain showed patchy left basal ganglia as well as left frontoparietal cortical and subcortical infarcts which are new in addition to the previous recent left MCA frontal infarct which was subacute. -Seen by speech therapy modified barium swallow consistent with silent aspiration. -Core track tube was placed which he pulled it out. -Core track tube reinserted 05/02/2020 by dietary.  Tube feeds restarted 05/02/2020. -PT OT recommended CIR but now he is too weak to participate and recommendation has been changed to SNF. -Hemoglobin A1c is 5.3, LDL is 84,  -Echo shows normal ejection fraction with no source of emboli. -Carotid Doppler with left ICA 80 to 99%. -He is currently on heparin and Coumadin till INR is therapeutic.  Front of his stop now that his INR is 2.5 and his Coumadin has been held given his gross hematuria -Patient is on aspirin Plavix heparin and Coumadin and vascular surgery has recommended triple therapy for 1 month and then going on Coumadin and aspirin alone  -Neurology recommends to continue  aspirin and Plavix for 30 days for fresh carotid stent then aspirin alone.  However will need to continue Coumadin for A. fib but this has been held due to his hematuria for now and likely can be resumed tomorrow after discussion with neurology.  -We will consult palliative for goals of care discussion given his worsening hematuria -He is now having gross hematuria so we will need to watch carefully and Urology recommending CT Abd/Pelvis w/wo Contrast unfortunately was not done last night but because his renal function worsened with done with out contrast today -CT of the abdomen pelvis done and showed no hydronephrosis of either kidney so we will resume his anticoagulation given that his urinary cleared and improved  Dyspnea with increased labored breathing acute respiratory failure with hypoxia  -Chest x-ray done this morning and showed "Masslike density at the RIGHT hilum, suspicious for neoplastic mass or lymphadenopathy, less likely perihilar pneumonia. Recommend chest CT for further characterization. Diffuse bilateral interstitial prominence, presumably some degree of edema/volume overload. Additional patchy opacities within the bilateral perihilar and basilar regions, pneumonia versus pulmonary edema, alternatively pleural plaques related to previous asbestos exposure. Enteric tube passes below the diaphragm"  -Fluid stopped and given IV Lasix 40 mg x 1 -CT scan showed no evidence of pneumonia but there was mild scarring/fibrotic change within his lung and there is no noted consolidation, pleural effusion or pneumothorax; CT did not mention any masslike consolidation -Started Xopenex/Atrovent breathing treatments -SpO2: 95 % O2 Flow Rate (L/min): 2 L/min -Continue supplemental oxygen via nasal cannula and wean O2 as follow -Continuous pulse oximetry maintain O2  saturation greater 90% -Also start empiric antibiotics in case this was a pneumonia and he is on IV ceftriaxone and IV  azithromycin -Obtain procalcitonin level in the morning -Repeat chest x-ray today showed that the lungs were clear and the heart size was normal and no pneumothorax or pleural effusions noted  Left carotid artery stenosis vascular surgery status post carotid artery stenting  -Vascular was following and signed off the need aspirin and Plavix for at least a month  History of clotting disorder including PE DVT  -Was on Coumadin prior to admission.   -Doppler of the lower extremity shows chronic DVT.  Continued heparin and Coumadin but both of them and held.  INR now therapeutic. Pharmacy following INR and is now 1.6 -We will resume his Coumadin today given that his urine is fairly clear  Hyperlipidemia  -LDL was 84  -Continue Pravastatin 40 mg daily. -Continue the same dose upon discharge.  Paroxysmal A. fib  -CHA2DS2-VASc score above 5 continue Coumadin on discharge.   -INR slowly trending up to 2.5 today and today it is now 1.6.  Pharmacy adjusting the dose and per my discussion with Dr. Erlinda Hong yesterday we held the Coumadin but will resume today  Essential HTN -Currently on Carvedilol twice a day.  Off of Cleviprex drip.  Dysphagia and Nutrition  -Patient pulled out a Cortrak tube 04/2420 night.   -Patient had MBS done on 04/29/2020 with findings consistent with silent aspiration. -Cortrak tube was placed again on 05/02/2020. -Continue with nutrition through tube feedings and they were held given his rhonchorous breath sounds -We will need to discuss PEG tube placement if the patient is not able to eat; -Speech therapy states that the patient failed miserably and was worse today than he did yesterday we will need to initiate discussions about PEG tube placement given that the Panda tube is not a permanent fix.  Patient's family is likely going to lean towards PEG tube placement  AKI on CKD stage II, worsening Metabolic Acidosis -His creatinine did trend up and initially they  thought was prerenal but could be obstructive and I feel strongly that this is obstruction -He was started on half-normal saline 100 cc an hour for 2 day has now stopped given his rhonchorous breath sounds and because he was 12 L positive -Renal work-up showing that his urinalysis was just pure blood and that his urine had many bacteria, greater than 50 RBCs, 21-50 WBCs with no squamous epithelial cells.  Urine osmolality was 577, urine creatinine was 36.69, urine sodium level 45 -Urine cultures showing Klebsiella Oxytocoa  -Renal Ultrasound showed "Bladder is distended and contains debris. Bladder infection could present this fashion. Prevoid volume 951.9 CC. Mild prominence right renal pelvis noted. 2. Increased echogenicity both kidneys consistent with chronic medical renal disease." -I spoke with with Dr. Tresa Moore of Urology who recommended a CT of the abdomen and pelvis with and without contrast for baseline imaging given his hematuria and was concerned that his bladder prevoid volume was 951. -Currently continuing his aspirin and Plavix but holding his Coumadin and heparin drip -I discussed with nephrology Dr. Carolin Sicks who feels that this is all obstruction and post renal and recommended Foley catheter placement which was done. -Patient's BUNs/creatinine is slowly trending up and went from 16/0.76 -> 29/1/30 -> 38/1.40 -> 44/1.50 -> 53/1.79 -> 55/2.12 today is improved and is 55/1.81 and patient has diuresed almost 8 L yesterday since yesterday -If not improving with Foley catheter drainage and antibiotic coverage definitely  will consult nephrology for further evaluation formally but he is now improving slowly  Acute Urinary Retention -Bladder ultrasound showed greater than 960 today again and renal ultrasound as above  -Foley catheter was placed and he had immediate 1800 mL output -Continue with Foley catheter for now and drainage -May need to  add Tamsulosin -CT scan of the abdomen without  pelvis done given his renal function worsening and showed some hydronephrosis of either kidney and no renal or ureteral calculi identified but there was fluid/inflammation of uncertain origin with a differential including pancreatitis and pyelonephritis but could have been also localizing simple ascites -See below; will likely leave Foley catheter in place for a while  Normocytic Anemia -Patient's hemoglobin did drop slightly but is now trending back up  -Hemoglobin/hematocrit is stable at 10.9/33.6  -urine is cleare -We will resume his anticoagulation with Coumadin today and continue to monitor for signs and symptoms of bleeding; currently no more overt bleeding noted -Repeat CBC in a.m.  Klebsiella Urinary Tract Infection -Urine was very dark yesterday and tested and is very turbid and brown-colored but did show many bacteria, more than 50 RBCs per high-power field, no squamous epithelial cells, and then 21-50 WBCs -Culture showing greater than 100,000 colony-forming units of Klebsiella Oxytocoa -Pete urinalysis done today showed a cloudy appearance with yellow color with large hemoglobin, large leukocytes, negative nitrites, many bacteria, 21-50 RBCs per high-power field, 21-50 WBCs -Empirically started IV ceftriaxone but may need to escalate. -Patient's WBC is trending upward went from 9.1 -> 12.3 -> 15.4 and today it is 13.2 -Blood cultures x2 showed no growth to date less than 12 hours of -Could potentially have pyelonephritis given the fluid/inflammation of unknown etiology that is ill-defined and centered around the pancreatic tail and just anterior to the left kidney; I doubt that this is pancreatitis given that his lipase level was normal and this likely could be pyelonephritis and will have neurology weigh in further -Repeat CBC in a.m.  Gross Hematuria, improving after Foley catheter was placed -See above -Spoke with urology who recommends holding off Foley catheter now and obtain a  CT of the abdomen pelvis for further baseline imaging and then review -His Coumadin was held yesterday and will continue to hold for now and if improves can likely resume tomorrow night -Discussed with urology Dr. Diona Fanti and he states that he will have Dr. Tresa Moore evaluate -Once urology comes by will have them evaluate the CT scan of the abdomen pelvis and weigh in on the fluid collection is ill-defined -We will resume his Coumadin today given that his urine is clearer  Hypokalemia -Improved -Patient potassium now 4.4 -Continue monitor if it is necessary -Repeat CMP in a.m.  Scrotal edema  -elevation and scrotal ultrasound next-likely in setting of his anasarca -Continue with antibiotics  DVT prophylaxis: We will resume Coumadin today Code Status: FULL CODE Family Communication: Spoke to wife and son at bedside extensively Disposition Plan: Pending further clinical improvement and clearance by neurology; will need SNF level of care and she is medically stable  Status is: Inpatient  Remains inpatient appropriate because:Unsafe d/c plan, IV treatments appropriate due to intensity of illness or inability to take PO and Inpatient level of care appropriate due to severity of illness   Dispo: The patient is from: Home              Anticipated d/c is to: SNF              Anticipated  d/c date is: 3 days              Patient currently is not medically stable to d/c.  Consultants:   Vascular Surgery  Neurology  Palliative Care Medicine  Discussed the case with nephrology Dr. Carolin Sicks  Urology Dr. Diona Fanti   Procedures:  Status post left transcarotid artery stenting for symptomatic disease complicated by stroke  Antimicrobials:  Anti-infectives (From admission, onward)   Start     Dose/Rate Route Frequency Ordered Stop   05/04/20 0945  azithromycin (ZITHROMAX) 500 mg in sodium chloride 0.9 % 250 mL IVPB  Status:  Discontinued        500 mg 250 mL/hr over 60 Minutes  Intravenous Every 24 hours 05/04/20 0859 05/05/20 0956   05/04/20 0900  cefTRIAXone (ROCEPHIN) 1 g in sodium chloride 0.9 % 100 mL IVPB        1 g 200 mL/hr over 30 Minutes Intravenous Every 24 hours 05/04/20 0803     04/27/20 0600  ceFAZolin (ANCEF) IVPB 2g/100 mL premix  Status:  Discontinued       Note to Pharmacy: Send with pt to OR   2 g 200 mL/hr over 30 Minutes Intravenous On call 04/26/20 0805 04/26/20 2018   04/27/20 0600  ceFAZolin (ANCEF) IVPB 2g/100 mL premix        2 g 200 mL/hr over 30 Minutes Intravenous 30 min pre-op 04/26/20 2013 04/27/20 0804   04/27/20 0000  ceFAZolin (ANCEF) IVPB 1 g/50 mL premix  Status:  Discontinued       Note to Pharmacy: Send with pt to OR   1 g 100 mL/hr over 30 Minutes Intravenous On call 04/26/20 0800 04/26/20 0805        Subjective: Seen and examined at bedside he remains somnolent and drowsy and patient's family states that he has been resting since 7 AM this morning.  Because he is little bit more somnolent today neurology related medicine today MRI and EEG.  MRI was unchanged and EEG showed slight improvement but still short encephalopathy.  Will need to start treatment feeding tube discussion given that the patient failed his speech therapy evaluation miserably today.  Gust with the family and the patient his numbers are improving and they were happy about this.  They are concerned about his scrotal edema so will obtain scrotal Dopplers and ultrasound.  Objective: Vitals:   05/05/20 0500 05/05/20 0744 05/05/20 1259 05/05/20 1533  BP:  116/68 107/65 105/67  Pulse:  97 (!) 113   Resp:  18  18  Temp:  98.9 F (37.2 C) 98.5 F (36.9 C) 98.8 F (37.1 C)  TempSrc:  Oral Oral Oral  SpO2:  94% 94% 95%  Weight: 85.5 kg     Height:        Intake/Output Summary (Last 24 hours) at 05/05/2020 1834 Last data filed at 05/05/2020 1100 Gross per 24 hour  Intake --  Output 4500 ml  Net -4500 ml   Filed Weights   04/30/20 0500 05/01/20 0453  05/05/20 0500  Weight: 87.2 kg 87.1 kg 85.5 kg   Examination: Physical Exam:  Constitutional: Patient is an ill-appearing Caucasian male currently in no acute distress and he is resting and somnolent and drowsy today Eyes: Lids are normal. ENMT: External Ears, Nose appear normal.  Cortrak in place Neck: Appears normal, supple, no cervical masses, normal ROM, no appreciable thyromegaly: No JVD Respiratory: Diminished to auscultation bilaterally with coarse breath sounds, no wheezing, rales, rhonchi or  crackles.  Mildly labored breathing and slight tachypnea but no accessory muscle use. Cardiovascular: RRR, no murmurs / rubs / gallops. S1 and S2 auscultated.  Has mild lower extremity edema Abdomen: Soft, non-tender, non-distended.  Bowel sounds positive.  GU: Deferred. Musculoskeletal: No clubbing / cyanosis of digits/nails.  + Right upper arm hemiplegia Skin: No rashes, lesions, ulcers on limited skin evaluation. No induration; Warm and dry.  Neurologic: Patient was not awake enough to participate in examination Psychiatric: Impaired judgment and insight.  Somnolent and drowsy. Normal mood and appropriate affect.   Data Reviewed: I have personally reviewed following labs and imaging studies  CBC: Recent Labs  Lab 05/02/20 0242 05/03/20 0430 05/04/20 0225 05/04/20 1326 05/05/20 0302  WBC 8.4 9.1 12.3* 15.4* 13.2*  NEUTROABS  --   --  10.6* 13.5* 10.7*  HGB 11.5* 10.4* 10.2* 10.8* 10.9*  HCT 34.7* 32.2* 31.2* 34.2* 33.6*  MCV 92.0 92.8 92.6 95.0 92.1  PLT 240 233 256 271 [id number]   Basic Metabolic Panel: Recent Labs  Lab 05/02/20 0741 05/03/20 0430 05/03/20 1310 05/04/20 0225 05/04/20 1326 05/05/20 0302  NA 145 144 143 140 144 145  K 3.4* 3.5 4.1 4.2 4.4 3.9  CL 115* 117* 115* 113* 114* 112*  CO2 22 21* 20* 20* 21* 23  GLUCOSE 132* 136* 154* 173* 131* 144*  BUN 29* 38* 44* 53* 55* 55*  CREATININE 1.30* 1.40* 1.50* 1.79* 2.12* 1.81*  CALCIUM 8.2* 7.8* 7.9* 7.5* 8.0* 8.2*   MG 2.1  --   --  1.8 1.9 1.9  PHOS  --   --   --  2.9 3.8 3.9   GFR: Estimated Creatinine Clearance: 37.2 mL/min (A) (by C-G formula based on SCr of 1.81 mg/dL (H)). Liver Function Tests: Recent Labs  Lab 05/02/20 0741 05/03/20 0430 05/04/20 0225 05/04/20 1326 05/05/20 0302  AST 27 34 23 19 26   ALT 29 35 32 30 35  ALKPHOS 34* 30* 30* 29* 31*  BILITOT 1.1 0.6 1.0 1.1 0.6  PROT 4.9* 4.6* 4.8* 5.4* 5.4*  ALBUMIN 2.5* 2.4* 2.3* 2.4* 2.5*   Recent Labs  Lab 05/04/20 1326  LIPASE 49   No results for input(s): AMMONIA in the last 168 hours. Coagulation Profile: Recent Labs  Lab 05/01/20 0102 05/02/20 0242 05/03/20 0430 05/04/20 0225 05/05/20 0302  INR 1.3* 1.9* 2.5* 1.8* 1.6*   Cardiac Enzymes: Recent Labs  Lab 05/03/20 0430  CKTOTAL 124   BNP (last 3 results) No results for input(s): PROBNP in the last 8760 hours. HbA1C: No results for input(s): HGBA1C in the last 72 hours. CBG: Recent Labs  Lab 05/04/20 2315 05/05/20 0342 05/05/20 0740 05/05/20 1207 05/05/20 1612  GLUCAP 146* 136* 150* 110* 137*   Lipid Profile: No results for input(s): CHOL, HDL, LDLCALC, TRIG, CHOLHDL, LDLDIRECT in the last 72 hours. Thyroid Function Tests: No results for input(s): TSH, T4TOTAL, FREET4, T3FREE, THYROIDAB in the last 72 hours. Anemia Panel: No results for input(s): VITAMINB12, FOLATE, FERRITIN, TIBC, IRON, RETICCTPCT in the last 72 hours. Sepsis Labs: No results for input(s): PROCALCITON, LATICACIDVEN in the last 168 hours.  Recent Results (from the past 240 hour(s))  MRSA PCR Screening     Status: None   Collection Time: 04/27/20  3:39 PM   Specimen: Nasopharyngeal  Result Value Ref Range Status   MRSA by PCR NEGATIVE NEGATIVE Final    Comment:        The GeneXpert MRSA Assay (FDA approved for NASAL specimens only), is one  component of a comprehensive MRSA colonization surveillance program. It is not intended to diagnose MRSA infection nor to guide  or monitor treatment for MRSA infections. Performed at [facility], [place] [address], [place], [place] [address]   Culture, Urine     Status: Abnormal   Collection Time: 05/03/20 10:42 AM   Specimen: Urine, Random  Result Value Ref Range Status   Specimen Description URINE, RANDOM  Final   Special Requests   Final    NONE Performed at [facility], [place] [address], [place], [place] [address]    Culture >=100,000 COLONIES/mL KLEBSIELLA OXYTOCA (A)  Final   Report Status 05/05/2020 FINAL  Final   Organism ID, Bacteria KLEBSIELLA OXYTOCA (A)  Final      Susceptibility   Klebsiella oxytoca - MIC*    AMPICILLIN RESISTANT Resistant     CEFAZOLIN 8 SENSITIVE Sensitive     CEFEPIME <=0.12 SENSITIVE Sensitive     CEFTRIAXONE <=0.25 SENSITIVE Sensitive     CIPROFLOXACIN <=0.25 SENSITIVE Sensitive     GENTAMICIN <=1 SENSITIVE Sensitive     IMIPENEM <=0.25 SENSITIVE Sensitive     NITROFURANTOIN <=16 SENSITIVE Sensitive     TRIMETH/SULFA <=20 SENSITIVE Sensitive     AMPICILLIN/SULBACTAM 4 SENSITIVE Sensitive     PIP/TAZO <=4 SENSITIVE Sensitive     * >=100,000 COLONIES/mL KLEBSIELLA OXYTOCA  Culture, blood (routine x 2)     Status: None (Preliminary result)   Collection Time: 05/04/20  4:52 PM   Specimen: BLOOD LEFT HAND  Result Value Ref Range Status   Specimen Description BLOOD LEFT HAND  Final   Special Requests   Final    BOTTLES DRAWN AEROBIC ONLY Blood Culture adequate volume   Culture   Final    NO GROWTH < 12 HOURS Performed at [facility], [address], [place], [place] [address]    Report Status PENDING  Incomplete    RN Pressure Injury Documentation:     Estimated body mass index is 24.2 kg/m as calculated from the following:   Height as of this encounter: 6\' 2"  (1.88 m).   Weight as of this encounter: 85.5 kg.  Malnutrition Type:  Nutrition Problem: Inadequate oral intake Etiology: dysphagia   Malnutrition Characteristics:  Signs/Symptoms:  NPO status   Nutrition Interventions:  Interventions: Tube feeding   Radiology Studies: CT ABDOMEN PELVIS WO CONTRAST  Result Date: 05/04/2020 CLINICAL DATA:  Shortness of breath. Evaluate for pneumonia. Hematuria. EXAM: CT CHEST, ABDOMEN AND PELVIS WITHOUT CONTRAST TECHNIQUE: Multidetector CT imaging of the chest, abdomen and pelvis was performed following the standard protocol without IV contrast. COMPARISON:  None. FINDINGS: CT CHEST FINDINGS Cardiovascular: No pericardial effusion. No thoracic aortic aneurysm. Mild atherosclerosis of the thoracic aorta. Three-vessel coronary artery calcifications. Mediastinum/Nodes: No mass or enlarged lymph nodes are seen within the mediastinum or perihilar regions. Enteric tube passes to the stomach. Trachea is unremarkable. Lungs/Pleura: Patchy mild scarring/fibrosis within each lung. No consolidation, pleural effusion or pneumothorax. Musculoskeletal: No acute or suspicious osseous finding. Degenerative spondylosis of the kyphotic and slightly scoliotic thoracic spine, mild to moderate in degree. CT ABDOMEN PELVIS FINDINGS Hepatobiliary: No focal liver abnormality is seen. Gallbladder is unremarkable. No bile duct dilatation is seen. Pancreas: Unremarkable. Fluid is seen adjacent to the pancreatic tail, of uncertain origin. Spleen: Normal in size without focal abnormality. Adrenals/Urinary Tract: Adrenal glands are unremarkable. Asymmetrically prominent fluid/inflammation about the LEFT kidney, including thickening of the anterior perinephric fascia. No hydronephrosis of the  LEFT kidney. Perhaps mild pelviectasis of the RIGHT kidney without frank hydronephrosis. Bilateral hydroureter, mild to moderate in degree. No renal or ureteral calculi. Bladder is decompressed by Foley catheter. No ureteral calculi seen. Stomach/Bowel: No dilated large or small bowel loops. Appendix is normal. Stomach is decompressed by a nasogastric tube. Scattered diverticulosis of the  sigmoid and descending colon. Vascular/Lymphatic: Mild atherosclerosis of the normal caliber abdominal aorta. Chronic varices within the subcutaneous soft tissues of the lower pelvis. Incidental note of a retroaortic LEFT renal vein. No enlarged lymph nodes are identified. Reproductive: Prostate is unremarkable. Other: Small amount of free fluid in the LEFT upper quadrant, most prominently seen between the pancreatic tail and LEFT kidney. No abscess-like collection is identified. No free intraperitoneal air. Musculoskeletal: Degenerative spondylosis of the slightly scoliotic lumbar spine, moderate in degree. No acute appearing osseous abnormality. Ill-defined fluid/edema within the subcutaneous soft tissues of the abdomen and pelvis indicating anasarca. IMPRESSION: 1. Ill-defined fluid/inflammation centered about the pancreatic tail and just anterior to the LEFT kidney. This fluid/inflammation is of uncertain origin. Differential considerations include acute pancreatitis and pyelonephritis. Alternatively, this may represent localized simple ascites. Recommend correlation with pancreatic lab values and urinalysis. 2. No hydronephrosis of either kidney. No renal or ureteral calculi identified. 3. No evidence of pneumonia. Mild scarring/fibrotic change within each lung. 4. Three-vessel coronary artery calcifications. 5. Anasarca. Aortic Atherosclerosis (ICD10-I70.0). Electronically Signed   By: Franki Cabot M.D.   On: 05/04/2020 11:34   CT CHEST WO CONTRAST  Result Date: 05/04/2020 CLINICAL DATA:  Shortness of breath. Evaluate for pneumonia. Hematuria. EXAM: CT CHEST, ABDOMEN AND PELVIS WITHOUT CONTRAST TECHNIQUE: Multidetector CT imaging of the chest, abdomen and pelvis was performed following the standard protocol without IV contrast. COMPARISON:  None. FINDINGS: CT CHEST FINDINGS Cardiovascular: No pericardial effusion. No thoracic aortic aneurysm. Mild atherosclerosis of the thoracic aorta. Three-vessel  coronary artery calcifications. Mediastinum/Nodes: No mass or enlarged lymph nodes are seen within the mediastinum or perihilar regions. Enteric tube passes to the stomach. Trachea is unremarkable. Lungs/Pleura: Patchy mild scarring/fibrosis within each lung. No consolidation, pleural effusion or pneumothorax. Musculoskeletal: No acute or suspicious osseous finding. Degenerative spondylosis of the kyphotic and slightly scoliotic thoracic spine, mild to moderate in degree. CT ABDOMEN PELVIS FINDINGS Hepatobiliary: No focal liver abnormality is seen. Gallbladder is unremarkable. No bile duct dilatation is seen. Pancreas: Unremarkable. Fluid is seen adjacent to the pancreatic tail, of uncertain origin. Spleen: Normal in size without focal abnormality. Adrenals/Urinary Tract: Adrenal glands are unremarkable. Asymmetrically prominent fluid/inflammation about the LEFT kidney, including thickening of the anterior perinephric fascia. No hydronephrosis of the LEFT kidney. Perhaps mild pelviectasis of the RIGHT kidney without frank hydronephrosis. Bilateral hydroureter, mild to moderate in degree. No renal or ureteral calculi. Bladder is decompressed by Foley catheter. No ureteral calculi seen. Stomach/Bowel: No dilated large or small bowel loops. Appendix is normal. Stomach is decompressed by a nasogastric tube. Scattered diverticulosis of the sigmoid and descending colon. Vascular/Lymphatic: Mild atherosclerosis of the normal caliber abdominal aorta. Chronic varices within the subcutaneous soft tissues of the lower pelvis. Incidental note of a retroaortic LEFT renal vein. No enlarged lymph nodes are identified. Reproductive: Prostate is unremarkable. Other: Small amount of free fluid in the LEFT upper quadrant, most prominently seen between the pancreatic tail and LEFT kidney. No abscess-like collection is identified. No free intraperitoneal air. Musculoskeletal: Degenerative spondylosis of the slightly scoliotic lumbar  spine, moderate in degree. No acute appearing osseous abnormality. Ill-defined fluid/edema within the subcutaneous soft tissues  of the abdomen and pelvis indicating anasarca. IMPRESSION: 1. Ill-defined fluid/inflammation centered about the pancreatic tail and just anterior to the LEFT kidney. This fluid/inflammation is of uncertain origin. Differential considerations include acute pancreatitis and pyelonephritis. Alternatively, this may represent localized simple ascites. Recommend correlation with pancreatic lab values and urinalysis. 2. No hydronephrosis of either kidney. No renal or ureteral calculi identified. 3. No evidence of pneumonia. Mild scarring/fibrotic change within each lung. 4. Three-vessel coronary artery calcifications. 5. Anasarca. Aortic Atherosclerosis (ICD10-I70.0). Electronically Signed   By: Franki Cabot M.D.   On: 05/04/2020 11:34   MR BRAIN WO CONTRAST  Result Date: 05/05/2020 CLINICAL DATA:  Stroke, follow up EXAM: MRI HEAD WITHOUT CONTRAST TECHNIQUE: Multiplanar, multiecho pulse sequences of the brain and surrounding structures were obtained without intravenous contrast. COMPARISON:  04/28/2020 and prior. FINDINGS: Please note that limited sequence acquisition limits evaluation. Brain: Mild cerebral atrophy with ex vacuo dilatation. No midline shift, ventriculomegaly or extra-axial fluid collection. No intracranial hemorrhage. Redemonstration of multifocal subacute infarcts involving the left cerebrum, unchanged. Tiny DWI hyperintensity involving the right caudate head on prior exam is not demonstrated. No new acute/subacute infarct. Multifocal remote insults are better demonstrated on prior exam. Vascular: Not well evaluated. Skull and upper cervical spine: No focal abnormality. Sinuses/Orbits: Not well evaluated. Other: None. IMPRESSION: Multifocal subacute left cerebral infarcts, unchanged. No new infarct or intracranial hemorrhage. Prior punctate right caudate head DWI  hyperintensity is not demonstrated. Electronically Signed   By: Primitivo Gauze M.D.   On: 05/05/2020 15:25   DG CHEST PORT 1 VIEW  Result Date: 05/05/2020 CLINICAL DATA:  Shortness of breath. EXAM: PORTABLE CHEST 1 VIEW COMPARISON:  CT chest, abdomen and pelvis and single view of the chest 05/04/2020. FINDINGS: Lungs are clear. Heart size is normal. No pneumothorax or pleural effusion. IMPRESSION: No acute disease. Electronically Signed   By: Inge Rise M.D.   On: 05/05/2020 10:37   DG CHEST PORT 1 VIEW  Result Date: 05/04/2020 CLINICAL DATA:  Shortness of breath EXAM: PORTABLE CHEST 1 VIEW COMPARISON:  Chest x-ray dated 01/10/2006 FINDINGS: Heart size is normal. Masslike density at the RIGHT hilum. Diffuse interstitial prominence, presumably edema. Additional bilateral perihilar and basilar opacities. No pleural effusion or pneumothorax is seen. Osseous structures about the chest are unremarkable. Enteric tube passes below the diaphragm. IMPRESSION: 1. Masslike density at the RIGHT hilum, suspicious for neoplastic mass or lymphadenopathy, less likely perihilar pneumonia. Recommend chest CT for further characterization. 2. Diffuse bilateral interstitial prominence, presumably some degree of edema/volume overload. 3. Additional patchy opacities within the bilateral perihilar and basilar regions, pneumonia versus pulmonary edema, alternatively pleural plaques related to previous asbestos exposure. 4. Enteric tube passes below the diaphragm. Electronically Signed   By: Franki Cabot M.D.   On: 05/04/2020 08:22   DG Swallowing Func-Speech Pathology  Result Date: 05/04/2020 Objective Swallowing Evaluation: Type of Study: MBS-Modified Barium Swallow Study  Patient Details Name: John Lambert MRN: [id number] Date of Birth: Sep 27, 1940 Today's Date: 05/04/2020 Time: SLP Start Time (ACUTE ONLY): [id number] -SLP Stop Time (ACUTE ONLY): 0853 SLP Time Calculation (min) (ACUTE ONLY): 18 min Past Medical  History: Past Medical History: Diagnosis Date  Blood clotting disorder ([place])   Blood dyscrasia   Cancer ([place])   melanoma removed from right calf  Hypertension   Neuromuscular disorder ([place])   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 Past Surgical History: Past Surgical History: Procedure Laterality Date  IR ANGIO INTRA EXTRACRAN SEL COM CAROTID INNOMINATE UNI L MOD SED  04/27/2020  RADIOLOGY WITH ANESTHESIA N/A 04/27/2020  Procedure: IR WITH ANESTHESIA;  Surgeon: Radiologist, Medication, MD;  Location: [facility];  Service: Radiology;  Laterality: N/A;  SKIN BIOPSY    TRANSCAROTID ARTERY REVASCULARIZATION Left 04/27/2020  Procedure: LEFT TRANSCAROTID ARTERY REVASCULARIZATION;  Surgeon: Serafina Mitchell, MD;  Location: [facility] OR;  Service: Vascular;  Laterality: Left;  ULTRASOUND GUIDANCE FOR VASCULAR ACCESS Left 04/27/2020  Procedure: ULTRASOUND GUIDANCE FOR VASCULAR ACCESS;  Surgeon: Serafina Mitchell, MD;  Location: [facility] OR;  Service: Vascular;  Laterality: Left; HPI: Pt is an 79 y.o. 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 He also has pain in left upper leg/hip region, and R foot drag; Imaging notable for Numerous scattered punctate acute infarctions in the left  posterior frontal region consistent with micro embolic infarctions  which could either be in the anterior or middle cerebral artery  territory. Punctate acute infarction also affecting the splenium of  the corpus callosum. Pt was seen by SLP on 12/17 for a BSE and SLE with no subsequent need for SLP services. Left carotid TCAR procedure with stent placement conducted on 12/22. Following the procedure he was found to be aphasic unable to speak with left gaze deviation and right hemiparesis. Emergent CTA showed patent left carotid stent with good flow in the terminal left ICA as well as middle  cerebral artery in the M1 segment. There appeared to be decreased flow in one of the M2 branches by neurology. SLP was consulted due to pt failing the [place], but a failed swallow screen has not been documented in EMR. EEG 12/22: No seizures  Subjective: alert, not very verbally communicative but responding with gesture Assessment / Plan / Recommendation CHL IP CLINICAL IMPRESSIONS 05/04/2020 Clinical Impression Pt's presentation appears similar to that noted during the last MBS on 12/24, but with improved laryngeal sensation. He presented with oorpharyngeal dysphagia characterized by reduced posterior bolus propulsion, lingual retraction, and reduced anterior laryngeal moment. He exhibited vallecular residue, pyriform sinus residue, incomplete epiglottic inversion, reduced laryngeal vestibule closure, and an inconsistent pharyngeal delay. Aspiration (PAS 7) was inconsistently noted with full-tsp boluses nectar thick liquids and more consistently with full-tsp boluses of honey thick liquids. Aspiration was secondary to impaired timing of the swallow or aspiration of pyriform sinus residue during secondary swallows. All episodes of aspiration resulted in coughing, but cough was weak and ineffective. Amount of residue was improved with a left head turn posture and frequency of aspiration reduced. However, pt exhibited difficulty demonstrating with full lateralization to the left and penetration (PAS 3) was noted when his head was close to midline. It is recommended that nonoral alimentation be continued. Use of multiple precautions would still be necessary to ensure safety of p.o. intake including use of 1/2 tsp boluses of liquids and a left head turn. SLP anticipates that pt will have difficulty consistently using all of these and his risk of aspiration is moderate to high without their observace. SLP Visit Diagnosis Dysphagia, oropharyngeal phase (R13.12) Attention and concentration deficit following --  Frontal lobe and executive function deficit following -- Impact on safety and function Moderate aspiration risk;Risk for inadequate nutrition/hydration   CHL IP TREATMENT RECOMMENDATION 05/04/2020 Treatment Recommendations Therapy as outlined in treatment plan below;F/U MBS in --- days (Comment)   Prognosis 05/04/2020 Prognosis for Safe Diet Advancement Good Barriers to Reach Goals Severity  of deficits Barriers/Prognosis Comment -- CHL IP DIET RECOMMENDATION 05/04/2020 SLP Diet Recommendations Other (Comment);Alternative means - long-term Liquid Administration via -- Medication Administration Via alternative means Compensations Slow rate;Small sips/bites;Follow solids with liquid Postural Changes --   CHL IP OTHER RECOMMENDATIONS 05/04/2020 Recommended Consults -- Oral Care Recommendations Oral care QID Other Recommendations --   CHL IP FOLLOW UP RECOMMENDATIONS 05/04/2020 Follow up Recommendations Inpatient Rehab   CHL IP FREQUENCY AND DURATION 05/04/2020 Speech Therapy Frequency (ACUTE ONLY) min 2x/week Treatment Duration 2 weeks      CHL IP ORAL PHASE 05/04/2020 Oral Phase Impaired Oral - Pudding Teaspoon -- Oral - Pudding Cup -- Oral - Honey Teaspoon Reduced posterior propulsion Oral - Honey Cup Reduced posterior propulsion Oral - Nectar Teaspoon Reduced posterior propulsion Oral - Nectar Cup Reduced posterior propulsion Oral - Nectar Straw Reduced posterior propulsion Oral - Thin Teaspoon -- Oral - Thin Cup -- Oral - Thin Straw Reduced posterior propulsion;Lingual pumping Oral - Puree Reduced posterior propulsion;Lingual pumping Oral - Mech Soft -- Oral - Regular -- Oral - Multi-Consistency -- Oral - Pill -- Oral Phase - Comment --  CHL IP PHARYNGEAL PHASE 05/04/2020 Pharyngeal Phase Impaired Pharyngeal- Pudding Teaspoon -- Pharyngeal -- Pharyngeal- Pudding Cup -- Pharyngeal -- Pharyngeal- Honey Teaspoon Penetration/Aspiration before swallow;Penetration/Aspiration during swallow;Pharyngeal residue -  valleculae;Pharyngeal residue - pyriform;Reduced airway/laryngeal closure;Reduced tongue base retraction;Reduced anterior laryngeal mobility Pharyngeal Material enters airway, passes BELOW cords and not ejected out despite cough attempt by patient;Material enters airway, remains ABOVE vocal cords and not ejected out Pharyngeal- Honey Cup Penetration/Aspiration before swallow;Penetration/Aspiration during swallow;Pharyngeal residue - valleculae;Pharyngeal residue - pyriform;Reduced airway/laryngeal closure;Reduced tongue base retraction;Reduced anterior laryngeal mobility Pharyngeal Material enters airway, passes BELOW cords and not ejected out despite cough attempt by patient Pharyngeal- Nectar Teaspoon Penetration/Aspiration before swallow;Penetration/Aspiration during swallow;Pharyngeal residue - valleculae;Pharyngeal residue - pyriform;Reduced airway/laryngeal closure;Reduced tongue base retraction;Reduced anterior laryngeal mobility Pharyngeal Material enters airway, passes BELOW cords and not ejected out despite cough attempt by patient Pharyngeal- Nectar Cup Penetration/Aspiration before swallow;Penetration/Aspiration during swallow;Pharyngeal residue - valleculae;Pharyngeal residue - pyriform;Reduced airway/laryngeal closure;Reduced tongue base retraction;Reduced anterior laryngeal mobility Pharyngeal Material enters airway, passes BELOW cords and not ejected out despite cough attempt by patient Pharyngeal- Nectar Straw -- Pharyngeal -- Pharyngeal- Thin Teaspoon -- Pharyngeal -- Pharyngeal- Thin Cup -- Pharyngeal -- Pharyngeal- Thin Straw NT Pharyngeal -- Pharyngeal- Puree Pharyngeal residue - valleculae;Pharyngeal residue - pyriform;Reduced airway/laryngeal closure;Reduced anterior laryngeal mobility;Reduced tongue base retraction Pharyngeal -- Pharyngeal- Mechanical Soft -- Pharyngeal -- Pharyngeal- Regular -- Pharyngeal -- Pharyngeal- Multi-consistency -- Pharyngeal -- Pharyngeal- Pill -- Pharyngeal --  Pharyngeal Comment --  CHL IP CERVICAL ESOPHAGEAL PHASE 05/04/2020 Cervical Esophageal Phase WFL Pudding Teaspoon -- Pudding Cup -- Honey Teaspoon -- Honey Cup -- Nectar Teaspoon -- Nectar Cup -- Nectar Straw -- Thin Teaspoon -- Thin Cup -- Thin Straw -- Puree -- Mechanical Soft -- Regular -- Multi-consistency -- Pill -- Cervical Esophageal Comment -- Shanika I. Hardin Negus, [place], [place] Office number [phone] Pager [place] 05/04/2020, 10:23 AM              EEG adult  Result Date: 05/05/2020 Lora Havens, MD     05/05/2020  1:47 PM Patient Name: John Lambert MRN: [id number] Epilepsy Attending: Lora Havens Referring Physician/Provider: Dr Rosalin Hawking Date: 05/05/2020 Duration: 24.53 mins Patient history: 79yo M with left side stroke. EEG to evaluate for seizure Level of alertness: Awake, asleep AEDs during EEG study: None Technical aspects: This EEG  study was done with scalp electrodes positioned according to the 10-20 International system of electrode placement. Electrical activity was acquired at a sampling rate of 500Hz  and reviewed with a high frequency filter of 70Hz  and a low frequency filter of 1Hz . EEG data were recorded continuously and digitally stored. Description: The posterior dominant rhythm consists of 8 Hz activity of moderate voltage (25-35 uV) seen predominantly in posterior head regions, symmetric and reactive to eye opening and eye closing.Sleep was characterized by vertex waves, maximal frontocentral region EEG showed intermittent 3-5hz  theta-delta slowing in left hemisphere, maximal left frontotemporal region. Hyperventilation and photic stimulation were not performed.  ABNORMALITY -Intermiitent slow, left hemisphere, maximal left frontotemporal region  IMPRESSION: This study is suggestive of cortical dysfunction in left hemisphere, maximal left fronto-temporal region likely secondary to underlying stroke/structural  abnormality. No seizures or epileptiform discharges were seen throughout the recording. EEG appears to be improving to previous study.  Priyanka Barbra Sarks   Scheduled Meds:  aspirin  81 mg Per Tube Daily   carvedilol  25 mg Per Tube BID WC   Chlorhexidine Gluconate Cloth  6 each Topical Daily   clopidogrel  75 mg Per Tube Daily   feeding supplement (PROSource TF)  45 mL Per Tube BID   free water  175 mL Per Tube Q4H   insulin aspart  0-9 Units Subcutaneous Q4H   pravastatin  40 mg Per Tube Daily   warfarin  2.5 mg Per Tube ONCE-1600   Warfarin - Pharmacist Dosing Inpatient   Does not apply q1600   Continuous Infusions:  cefTRIAXone (ROCEPHIN)  IV 1 g (05/05/20 0936)   feeding supplement (JEVITY 1.5 CAL/FIBER) 55 mL/hr at 05/05/20 1301    LOS: 14 days   Kerney Elbe, DO [facility] PAGER is on [place]  If 7PM-7AM, please contact night-coverage [email or web address]

## 2020-05-06 DIAGNOSIS — I10 Essential (primary) hypertension: Secondary | ICD-10-CM | POA: Diagnosis not present

## 2020-05-06 DIAGNOSIS — D689 Coagulation defect, unspecified: Secondary | ICD-10-CM | POA: Diagnosis not present

## 2020-05-06 DIAGNOSIS — Z7901 Long term (current) use of anticoagulants: Secondary | ICD-10-CM | POA: Diagnosis not present

## 2020-05-06 DIAGNOSIS — R531 Weakness: Secondary | ICD-10-CM | POA: Diagnosis not present

## 2020-05-06 LAB — COMPREHENSIVE METABOLIC PANEL
ALT: 60 U/L — ABNORMAL HIGH (ref 0–44)
AST: 58 U/L — ABNORMAL HIGH (ref 15–41)
Albumin: 2.3 g/dL — ABNORMAL LOW (ref 3.5–5.0)
Alkaline Phosphatase: 32 U/L — ABNORMAL LOW (ref 38–126)
Anion gap: 9 (ref 5–15)
BUN: 48 mg/dL — ABNORMAL HIGH (ref 8–23)
CO2: 26 mmol/L (ref 22–32)
Calcium: 8.4 mg/dL — ABNORMAL LOW (ref 8.9–10.3)
Chloride: 115 mmol/L — ABNORMAL HIGH (ref 98–111)
Creatinine, Ser: 1.24 mg/dL (ref 0.61–1.24)
GFR, Estimated: 58 mL/min — ABNORMAL LOW (ref >60)
Glucose, Bld: 132 mg/dL — ABNORMAL HIGH (ref 70–99)
Potassium: 4.1 mmol/L (ref 3.5–5.1)
Sodium: 150 mmol/L — ABNORMAL HIGH (ref 135–145)
Total Bilirubin: 0.8 mg/dL (ref 0.3–1.2)
Total Protein: 5.2 g/dL — ABNORMAL LOW (ref 6.5–8.1)

## 2020-05-06 LAB — BLOOD CULTURE ID PANEL (REFLEXED) - BCID2

## 2020-05-06 LAB — GLUCOSE, CAPILLARY
Glucose-Capillary: 117 mg/dL — ABNORMAL HIGH (ref 70–99)
Glucose-Capillary: 137 mg/dL — ABNORMAL HIGH (ref 70–99)
Glucose-Capillary: 126 mg/dL — ABNORMAL HIGH (ref 70–99)
Glucose-Capillary: 135 mg/dL — ABNORMAL HIGH (ref 70–99)
Glucose-Capillary: 131 mg/dL — ABNORMAL HIGH (ref 70–99)
Glucose-Capillary: 122 mg/dL — ABNORMAL HIGH (ref 70–99)

## 2020-05-06 LAB — MAGNESIUM
Magnesium: 1.8 mg/dL (ref 1.7–2.4)
Magnesium: 2.3 mg/dL (ref 1.7–2.4)

## 2020-05-06 LAB — PROTIME-INR
INR: 1.5 — ABNORMAL HIGH (ref 0.8–1.2)
Prothrombin Time: 17.2 seconds — ABNORMAL HIGH (ref 11.4–15.2)

## 2020-05-06 LAB — BASIC METABOLIC PANEL
Anion gap: 11 (ref 5–15)
BUN: 40 mg/dL — ABNORMAL HIGH (ref 8–23)
CO2: 24 mmol/L (ref 22–32)
Calcium: 8.5 mg/dL — ABNORMAL LOW (ref 8.9–10.3)
Chloride: 112 mmol/L — ABNORMAL HIGH (ref 98–111)
Creatinine, Ser: 1.11 mg/dL (ref 0.61–1.24)
GFR, Estimated: >60 mL/min (ref >60)
Glucose, Bld: 132 mg/dL — ABNORMAL HIGH (ref 70–99)
Potassium: 4.5 mmol/L (ref 3.5–5.1)
Sodium: 147 mmol/L — ABNORMAL HIGH (ref 135–145)

## 2020-05-06 LAB — CBC WITH DIFFERENTIAL/PLATELET
Abs Immature Granulocytes: 0.06 10*3/uL (ref 0.00–0.07)
Basophils Absolute: 0 10*3/uL (ref 0.0–0.1)
Basophils Relative: 1 %
Eosinophils Absolute: 0.3 10*3/uL (ref 0.0–0.5)
Eosinophils Relative: 4 %
HCT: 31.9 % — ABNORMAL LOW (ref 39.0–52.0)
Hemoglobin: 10.6 g/dL — ABNORMAL LOW (ref 13.0–17.0)
Immature Granulocytes: 1 %
Lymphocytes Relative: 15 %
Lymphs Abs: 1.3 10*3/uL (ref 0.7–4.0)
MCH: 30.5 pg (ref 26.0–34.0)
MCHC: 33.2 g/dL (ref 30.0–36.0)
MCV: 91.9 fL (ref 80.0–100.0)
Monocytes Absolute: 0.9 10*3/uL (ref 0.1–1.0)
Monocytes Relative: 10 %
Neutro Abs: 6.3 10*3/uL (ref 1.7–7.7)
Neutrophils Relative %: 69 %
Platelets: 299 10*3/uL (ref 150–400)
RBC: 3.47 MIL/uL — ABNORMAL LOW (ref 4.22–5.81)
RDW: 14 % (ref 11.5–15.5)
WBC: 8.8 10*3/uL (ref 4.0–10.5)
nRBC: 0 % (ref 0.0–0.2)

## 2020-05-06 LAB — PHOSPHORUS: Phosphorus: 4.6 mg/dL (ref 2.5–4.6)

## 2020-05-06 MED ORDER — WARFARIN SODIUM 5 MG PO TABS
5.0000 mg | ORAL_TABLET | Freq: Once | ORAL | Status: AC
  Administered 2020-05-06: 5 mg
  Filled 2020-05-06: qty 1

## 2020-05-06 MED ORDER — MAGNESIUM SULFATE 2 GM/50ML IV SOLN
2.0000 g | Freq: Once | INTRAVENOUS | Status: AC
  Administered 2020-05-06: 2 g via INTRAVENOUS
  Filled 2020-05-06: qty 50

## 2020-05-06 MED ORDER — WARFARIN SODIUM 5 MG PO TABS: 5.0000 mg | ORAL_TABLET | Freq: Once | ORAL | Status: DC

## 2020-05-06 MED ORDER — FINASTERIDE 5 MG PO TABS
5.0000 mg | ORAL_TABLET | Freq: Every day | ORAL | Status: DC
  Administered 2020-05-06 – 2020-05-25 (×20): 5 mg via ORAL
  Filled 2020-05-06 (×21): qty 1

## 2020-05-06 MED ORDER — TAMSULOSIN HCL 0.4 MG PO CAPS: 0.4000 mg | ORAL_CAPSULE | Freq: Every day | ORAL | Status: DC

## 2020-05-06 MED ORDER — FREE WATER
175.0000 mL | Status: DC
  Administered 2020-05-06 – 2020-05-09 (×37): 175 mL

## 2020-05-06 NOTE — Progress Notes (Signed)
ANTICOAGULATION CONSULT NOTE  Pharmacy Consult for Warfarin Indication: on PTA warfarin for hx of DVT/PE, new acute stroke, new Afib  Patient Measurements: Height: 6\' 2"  (188 cm) Weight: 88.3 kg (194 lb 10.7 oz) IBW/kg (Calculated) : 82.2   Vital Signs: Temp: 98.6 F (37 C) (12/31 0348) Temp Source: Oral (12/31 0348) BP: 109/68 (12/31 0348) Pulse Rate: 98 (12/31 0348)  Labs: Recent Labs    05/04/20 0225 05/04/20 1326 05/05/20 0302 05/06/20 0400  HGB 10.2* 10.8* 10.9* 10.6*  HCT 31.2* 34.2* 33.6* 31.9*  PLT 256 271 294 299  LABPROT 20.6*  --  18.4* 17.2*  INR 1.8*  --  1.6* 1.5*  CREATININE 1.79* 2.12* 1.81* 1.24    Estimated Creatinine Clearance: 54.3 mL/min (by C-G formula based on SCr of 1.24 mg/dL).  Assessment: 79 yo male with L symptomatic carotid stenosis.  S/p L carotid TCAR procedure w/ stenting that was complicated by postprocedure stroke.  MRI brain shows patchy L basal ganglia and L frontoparietal cortical and subcortical infarcts (new).  No hemorrhagic transformation. New stroke and new onset atrial fibrillation.    Pharmacy consulted to resume warfarin.  Heparin infusion discontinued on 05/03/20.   Today's INR is 1.5 subtherapeutic, down from INR of  2.5 on 05/03/20.  Hematuria appears to be resolved.  I discussed anticoagulation plans with Dr. 05/05/20  and I was instructed to continue warfarin.   PTA warfarin dose: 2.5mg  daily except 1.25mg  on Mondays  Hgb has downtrended from 16 to 10 over 1 week, 10.9 this morning/stable. Platelets are stable, normal in the 200s.   Goal of Therapy:   INR goal 2-3  Monitor platelets by anticoagulation protocol: Yes     Plan: Warfarin 5mg  PO x 1 today Monitor daily INR, CBC, s/s bleeding Monitor hematuria   Saveah Bahar A. 08-03-1975, PharmD, BCPS, FNKF Clinical Pharmacist [place] Please utilize Amion for appropriate phone number to reach the unit pharmacist [facility])   05/06/2020 7:45 AM

## 2020-05-06 NOTE — Progress Notes (Signed)
Pt's son questioned pt's NG tube placement stating, "tube looked more taunt earlier yesterday". Pt w/o respiratory distress, O2 sat 94%, and NG tube markings noted to be as noted on assessment documentation. Will conti to monitor.

## 2020-05-06 NOTE — Plan of Care (Signed)
  Problem: Health Behavior/Discharge Planning: Goal: Ability to manage health-related needs will improve Outcome: Progressing   Problem: Clinical Measurements: Goal: Will remain free from infection Outcome: Progressing Goal: Diagnostic test results will improve Outcome: Progressing Goal: Respiratory complications will improve Outcome: Progressing Goal: Cardiovascular complication will be avoided Outcome: Progressing   Problem: Activity: Goal: Risk for activity intolerance will decrease Outcome: Progressing   Problem: Nutrition: Goal: Adequate nutrition will be maintained Outcome: Progressing   Problem: Coping: Goal: Level of anxiety will decrease Outcome: Progressing   Problem: Elimination: Goal: Will not experience complications related to bowel motility Outcome: Progressing Goal: Will not experience complications related to urinary retention Outcome: Progressing   Problem: Pain Managment: Goal: General experience of comfort will improve Outcome: Progressing   Problem: Safety: Goal: Ability to remain free from injury will improve Outcome: Progressing   Problem: Skin Integrity: Goal: Risk for impaired skin integrity will decrease Outcome: Progressing   Problem: Education: Goal: Knowledge of disease or condition will improve Outcome: Progressing Goal: Knowledge of secondary prevention will improve Outcome: Progressing Goal: Knowledge of patient specific risk factors addressed and post discharge goals established will improve Outcome: Progressing Goal: Individualized Educational Video(s) Outcome: Progressing   Problem: Coping: Goal: Will verbalize positive feelings about self Outcome: Progressing Goal: Will identify appropriate support needs Outcome: Progressing   Problem: Health Behavior/Discharge Planning: Goal: Ability to manage health-related needs will improve Outcome: Progressing   Problem: Self-Care: Goal: Verbalization of feelings and concerns  over difficulty with self-care will improve Outcome: Progressing   Problem: Nutrition: Goal: Risk of aspiration will decrease Outcome: Progressing Goal: Dietary intake will improve Outcome: Progressing   Problem: Ischemic Stroke/TIA Tissue Perfusion: Goal: Complications of ischemic stroke/TIA will be minimized Outcome: Progressing   

## 2020-05-06 NOTE — Progress Notes (Signed)
Pt's family concerned about pt's soft BP 118/70 and Coreg 25 mg due. Dr. Marland Mcalpine paged to inform.

## 2020-05-06 NOTE — Progress Notes (Signed)
PROGRESS NOTE    John Lambert  [id number] DOB: 1940-11-08 DOA: 04/19/2020 PCP: Harlan Stains, MD  Brief Narrative:  The patient is an 79 y.o.malewith PMH significant for hypertension, melanoma, neurofibroma, clotting disorder, history of DVT and PE on chronic Coumadinwhopresented to the ED on 12/14 with c/o : bilateral lower extremity weakness and difficulty ambulating associated with pain in the left hip region.(has a history of neurofibroma tumor removal from his spine about 4 years ago and melanoma removal from right calf about 30 years ago.) MRI brain obtainednoted numerousscattered punctate acute infarctions in the left posterior frontal region consistent with micro embolic infarctions which could either be in the anterior or middle cerebral arteryterritory. Punctate acute infarction also affecting the splenium ofthe corpus callosum.Extensive chronic small-vessel ischemic changes elsewherethroughout the brain.Small old left parietal vertex cortical and subcorticalInfarctions.  MRI lumbar spine showedchronicarachnoiditis with adhesions in the thecal sac and distorted cauda equina nerve roots from L2-L3 to the sacrum, but no evidence ofrecurrent spinal tumor.Possible right L5/S1 radiculitis after noting L4/5 multifactorial mild to moderate spinal stenosis. Vascular surgery was consulted and took patient on 12/22 for a left transcarotid artery stenting. Patient found to have 80% carotid stenosis and then following procedure down to less than 10% with stent. However, following surgery patient noted to have difficulty moving right side expressive aphasia and left gaze deviation and code stroke immediately called. Patient taken straight to CT scan. No new acute infarction noted. Patient underwent emergent left carotid diagnostic cerebral catheter angiogram with left MCA embolism noted from previous, but no acute findings. No intervention planned. Patient sent to medical ICU in  the absence of beds in the neuro ICU.EEG noted cortical dysfunction left hemisphere, but no seizures or epileptiform discharges seen throughout recording.  He was transferred to the stroke service on 04/27/2020 and then transferred out to the [facility] service on 04/29/2020.  Currently he continues to remain weak and his right upper arm is flaccid.  Now having gross hematuria as he has been placed on aspirin Plavix as well as heparin and Coumadin.  Heparin drip has now been stopped given that his Coumadin level therapeutic.  Next Coumadin will be held given his gross hematuria.  Renal function continues to worsen slightly.  Will need to continue monitor continue IV fluid hydration.  Will discuss with nephrology if renal function continues to worsen.  Case was discussed with urology Dr. Alexis Frock who recommends obtaining baseline imaging with a CT of the abdomen pelvis with and without contrast for hematuria.  Unfortunately imaging was not able to be obtained given his worsened renal function so this was done without contrast.  This showed possible pyelonephritis and pancreatitis was essentially ruled out given his normal lipase level.  Foley catheter was inserted yesterday and urine was sent off and grew out Klebsiella and we have placed him on antibiotics and his WBC is improving.  Renal function is improving now with Foley catheter drainage and he is diuresing fairly well.  Patient WBC is trending down as well as his renal function.  We will resume his anticoagulation on 05/05/2020.  Patient underwent CT scan of his chest abdomen pelvis yesterday.  He is little bit more somnolent today still case was discussed with neurology who ordered a repeat MRI which was unchanged and the EEG which still showed encephalopathy which was improved.  We will continue to monitor patient's clinical response closely.  Family today was concerned with some scrotal edema and erythema so his scrotum was elevated  and will obtain  scrotal ultrasound.  Likely this is in the setting of his anasarca and extreme volume overload  05/06/2020 scrotal edema is still there and he does have some bruising on his scrotum and a scrotal ultrasound was done below skin thickening in the right red tubular ectasia and there is also an ill-defined intratesticular heterogeneously hypoechoic area in the left lateral testicle as well as small bilateral hydroceles and bilateral varicoceles.  Urology was consulted and felt the patient had low suspicion of critical significant malignancy.  Recommend outpatient ultrasound in several months to verify stability..  Because of the scrotal ecchymosis and edema and Dr. Tresa Moore he was that this is just a cutaneous bruising and that it would take a few weeks for the color to change and feels that there is no hematoma abdomen on examination.  Urology recommends and trying a trial of void prior to discharge for having outpatient trial void in office.  Patient failed his swallow screen yesterday but he was awake and alert today.  Family wants to wait off on PEG tube placement and want another reevaluation prior to a being placed.  If patient fails again will pursue PEG tube Monday.  His sodium was also elevated induced insensible losses so his free water flushes were increased to 175 mL every 2 hours.  We will bit more awake and alert today  Assessment & Plan:   Principal Problem:   Weakness Active Problems:   Anticoagulated on warfarin   Impaired ambulation   Fall at home, initial encounter   Essential hypertension   Blood clotting disorder ([place])   Cerebral embolism with cerebral infarction   Stroke [facility])   Middle cerebral artery embolism, left  Acute CVA on presentation from left carotid artery stenosis status post left carotid stenting with complications with  new stroke.   -He underwent elective left carotid transcranial procedure with stenting complicated by postop stroke affecting his speech he is  completely aphasic and now with dense right hemiplegia. -He had an emergent CT angiogram and cerebral catheter angiogram which did not reveal large vessel occlusion. -EEG did not show any epileptiform activities. -MRI of the brain showed patchy left basal ganglia as well as left frontoparietal cortical and subcortical infarcts which are new in addition to the previous recent left MCA frontal infarct which was subacute. -Seen by speech therapy modified barium swallow consistent with silent aspiration. -Core track tube was placed which he pulled it out. -Core track tube reinserted 05/02/2020 by dietary.  Tube feeds restarted 05/02/2020. -PT OT recommended CIR but now he is too weak to participate and recommendation has been changed to SNF. -Hemoglobin A1c is 5.3, LDL is 84,  -Echo shows normal ejection fraction with no source of emboli. -Carotid Doppler with left ICA 80 to 99%. -He is currently on heparin and Coumadin till INR is therapeutic.  Front of his stop now that his INR is 2.5 and his Coumadin has been held given his gross hematuria -Patient is on aspirin Plavix heparin and Coumadin and vascular surgery has recommended triple therapy for 1 month and then going on Coumadin and aspirin alone  -Neurology recommends to continue aspirin and Plavix for 30 days for fresh carotid stent then aspirin alone.  However will need to continue Coumadin for A. fib but this has been held due to his hematuria for now and likely can be resumed tomorrow after discussion with neurology.  -We will consult palliative for goals of care discussion given his worsening hematuria -  He is now having gross hematuria so we will need to watch carefully and Urology recommending CT Abd/Pelvis w/wo Contrast unfortunately was not done last night but because his renal function worsened with done with out contrast today -CT of the abdomen pelvis done and showed no hydronephrosis of either kidney so we will resume his anticoagulation  given that his urinary cleared and improved; patient seems to be tolerating his anticoagulation well and has no overt signs of bleeding  Dyspnea with increased labored breathing acute respiratory failure with hypoxia, improving -Chest x-ray done this morning and showed "Masslike density at the RIGHT hilum, suspicious for neoplastic mass or lymphadenopathy, less likely perihilar pneumonia. Recommend chest CT for further characterization. Diffuse bilateral interstitial prominence, presumably some degree of edema/volume overload. Additional patchy opacities within the bilateral perihilar and basilar regions, pneumonia versus pulmonary edema, alternatively pleural plaques related to previous asbestos exposure. Enteric tube passes below the diaphragm"  -Fluid stopped and given IV Lasix 40 mg x 1 -CT scan showed no evidence of pneumonia but there was mild scarring/fibrotic change within his lung and there is no noted consolidation, pleural effusion or pneumothorax; CT did not mention any masslike consolidation -Started Xopenex/Atrovent breathing treatments -SpO2: 95 % O2 Flow Rate (L/min): 2 L/min -Continue supplemental oxygen via nasal cannula and wean O2 as follow -Continuous pulse oximetry maintain O2 saturation greater 90% -Also start empiric antibiotics in case this was a pneumonia and he is on IV ceftriaxone and IV azithromycin -Obtain procalcitonin level in the morning -Repeat chest x-ray today showed that the lungs were clear and the heart size was normal and no pneumothorax or pleural effusions noted  Left carotid artery stenosis vascular surgery status post carotid artery stenting  -Vascular was following and signed off the need aspirin and Plavix for at least a month  History of clotting disorder including PE DVT  -Was on Coumadin prior to admission.   -Doppler of the lower extremity shows chronic DVT.  Continued heparin and Coumadin but both of them and held.  INR now therapeutic.  Pharmacy following INR and is now 1.6 -We will resume his Coumadin today given that his urine is fairly clear  Hyperlipidemia  -LDL was 84  -Continue Pravastatin 40 mg daily. -Continue the same dose upon discharge.  Paroxysmal A. fib  -CHA2DS2-VASc score above 5 continue Coumadin on discharge.   -INR slowly trending up to 2.5 today and today it is now 1.6.  Pharmacy adjusting the dose and per my discussion with Dr. Erlinda Hong yesterday we held the Coumadin but will resume today  Essential HTN -Currently on Carvedilol twice a day.  Off of Cleviprex drip.  Dysphagia and Nutrition  -Patient pulled out a Cortrak tube 04/2420 night.   -Patient had MBS done on 04/29/2020 with findings consistent with silent aspiration. -Cortrak tube was placed again on 05/02/2020.  Continue with nutrition through tube feedings and have increased free water flushes given his hyper per natremia -Continue with nutrition through tube feedings and they were held given his rhonchorous breath sounds -We will need to discuss PEG tube placement if the patient is not able to eat; -Speech therapy states that the patient failed miserably and was worse today than he did yesterday we will need to initiate discussions about PEG tube placement given that the Panda tube is not a permanent fix.  Patient's family is likely going to lean towards PEG tube placement but want to have a repeat SLP evaluation which I have ordered again.  If  patient fails again we will likely pursue PEG tube on Monday and will continue core track tube feeding for now -I specifically spoke with the family and discussed with him that having a PEG tube will not prevent the patient from aspirating.  AKI on CKD stage II improving Metabolic Acidosis -His creatinine did trend up and initially they thought was prerenal but could be obstructive and I feel strongly that this is obstruction -He was started on half-normal saline 100 cc an hour for 2 day has now stopped  given his rhonchorous breath sounds and because he was 12 L positive -Renal work-up showing that his urinalysis was just pure blood and that his urine had many bacteria, greater than 50 RBCs, 21-50 WBCs with no squamous epithelial cells.  Urine osmolality was 577, urine creatinine was 36.69, urine sodium level 45 -Urine cultures showing Klebsiella Oxytocoa  -Renal Ultrasound showed "Bladder is distended and contains debris. Bladder infection could present this fashion. Prevoid volume 951.9 CC. Mild prominence right renal pelvis noted. 2. Increased echogenicity both kidneys consistent with chronic medical renal disease." -I spoke with with Dr. Tresa Moore of Urology who recommended a CT of the abdomen and pelvis with and without contrast for baseline imaging given his hematuria and was concerned that his bladder prevoid volume was 951. -Currently continuing his aspirin and Plavix but holding his Coumadin and heparin drip -I discussed with nephrology Dr. Carolin Sicks who feels that this is all obstruction and post renal and recommended Foley catheter placement which was done. -Patient's BUNs/creatinine is now trending back down and BUN/creatinine is now 48/1.24 -If not improving with Foley catheter drainage and antibiotic coverage definitely will consult nephrology for further evaluation formally but he is now improving slowly  Acute Urinary Retention -Bladder ultrasound showed greater than 960 today again and renal ultrasound as above  -Foley catheter was placed and he had immediate 1800 mL output -Continue with Foley catheter for now and drainage -May need to  add Tamsulosin -CT scan of the abdomen without pelvis done given his renal function worsening and showed some hydronephrosis of either kidney and no renal or ureteral calculi identified but there was fluid/inflammation of uncertain origin with a differential including pancreatitis and pyelonephritis but could have been also localizing simple ascites -See  below; will likely leave Foley catheter in place for a while and urology recommending trial of void the day before or the day of discharge versus outpatient  -Dr. Tresa Moore has also started the patient on finasteride  Hypernatremia -Likely secondary to insensible losses as he has a Panda Tube -Edema level this morning was 150 and will increase his free water flushes with 175 mL every 4 hours every 2 hours and reevaluate. -Repeat BMP this afternoon is pending  Normocytic Anemia -Patient's hemoglobin did drop slightly but is now trending back up  -Hemoglobin/hematocrit is stable at 10.9/33.6 yesterday and today is 10.6/31.9 -urine is clearer -We will resume his anticoagulation with Coumadin today and continue to monitor for signs and symptoms of bleeding; currently no more overt bleeding noted -Repeat CBC in a.m.  Klebsiella Urinary Tract Infection -Urine was very dark yesterday and tested and is very turbid and brown-colored but did show many bacteria, more than 50 RBCs per high-power field, no squamous epithelial cells, and then 21-50 WBCs -Culture showing greater than 100,000 colony-forming units of Klebsiella Oxytocoa -Pete urinalysis done today showed a cloudy appearance with yellow color with large hemoglobin, large leukocytes, negative nitrites, many bacteria, 21-50 RBCs per high-power field, 21-50  WBCs -Empirically started IV ceftriaxone but may need to escalate. -Patient's WBC is trending upward went from 9.1 -> 12.3 -> 15.4 and today it is 13.2 -Blood cultures x2 showed no growth to date less than 12 hours of -Could potentially have pyelonephritis given the fluid/inflammation of unknown etiology that is ill-defined and centered around the pancreatic tail and just anterior to the left kidney; I doubt that this is pancreatitis given that his lipase level was normal and this likely could be pyelonephritis and will have neurology weigh in further -Repeat CBC in a.m.  Gross Hematuria,  improving after Foley catheter was placed -See above -Spoke with urology who recommends holding off Foley catheter now and obtain a CT of the abdomen pelvis for further baseline imaging and then review -His Coumadin was held yesterday and will continue to hold for now and if improves can likely resume tomorrow night -Discussed with urology Dr. Diona Fanti and he states that he will have Dr. Tresa Moore evaluate -Once urology comes by will have them evaluate the CT scan of the abdomen pelvis and weigh in on the fluid collection is ill-defined -Coumadin was resumed on 05/05/2020 given that his urine is clearer  Hypokalemia -Improved -Patient potassium now 4.1 -Continue monitor if it is necessary -Repeat CMP in a.m.  Scrotal edema and ecchymosis -Elevation and scrotal ultrasound next-likely in setting of his anasarca -Continue with antibiotics -Doppler showed "Bilateral scrotal skin thickening. Right rete tubular ectasia. There is an intra testicular ill-defined heterogeneously hypoechoic area in the left lateral testicle measuring approximately 1.3 cmwith some internal blood flow. This has nonspecific imaging features. Differential considerations include unusual appearance of rete testis versus intra testicular mass. Presence of internal blood flow favors against testicular hematoma or segmental infarct. Recommend urologic consultation and potentially MRI for more detailed evaluation. There is also a subcentimeter left intra testicular cyst. Small  bilateral hydroceles.  Bilateral varicoceles. Bilateral epididymal head cysts." -  DVT prophylaxis: Anticoagulated with Coumadin Code Status: FULL CODE Family Communication: Spoke to daughter and son at bedside extensively Disposition Plan: Pending further clinical improvement and clearance by neurology; will need SNF level of care and she is medically stable  Status is: Inpatient  Remains inpatient appropriate because:Unsafe d/c plan, IV treatments  appropriate due to intensity of illness or inability to take PO and Inpatient level of care appropriate due to severity of illness   Dispo: The patient is from: Home              Anticipated d/c is to: SNF              Anticipated d/c date is: 3 days              Patient currently is not medically stable to d/c.  Consultants:   Vascular Surgery  Neurology  Palliative Care Medicine  Discussed the case with nephrology Dr. Carolin Sicks  Urology Dr. Dahlstedt/Dr. Tresa Moore   Procedures:  Status post left transcarotid artery stenting for symptomatic disease complicated by stroke  Antimicrobials:  Anti-infectives (From admission, onward)   Start     Dose/Rate Route Frequency Ordered Stop   05/04/20 0945  azithromycin (ZITHROMAX) 500 mg in sodium chloride 0.9 % 250 mL IVPB  Status:  Discontinued        500 mg 250 mL/hr over 60 Minutes Intravenous Every 24 hours 05/04/20 0859 05/05/20 0956   05/04/20 0900  cefTRIAXone (ROCEPHIN) 1 g in sodium chloride 0.9 % 100 mL IVPB  1 g 200 mL/hr over 30 Minutes Intravenous Every 24 hours 05/04/20 0803     04/27/20 0600  ceFAZolin (ANCEF) IVPB 2g/100 mL premix  Status:  Discontinued       Note to Pharmacy: Send with pt to OR   2 g 200 mL/hr over 30 Minutes Intravenous On call 04/26/20 0805 04/26/20 2018   04/27/20 0600  ceFAZolin (ANCEF) IVPB 2g/100 mL premix        2 g 200 mL/hr over 30 Minutes Intravenous 30 min pre-op 04/26/20 2013 04/27/20 0804   04/27/20 0000  ceFAZolin (ANCEF) IVPB 1 g/50 mL premix  Status:  Discontinued       Note to Pharmacy: Send with pt to OR   1 g 100 mL/hr over 30 Minutes Intravenous On call 04/26/20 0800 04/26/20 0805        Subjective: Seen and examined at bedside and he is more awake and alert and interactive today.  Patient's daughter thinks that he is doing much better today than he was yesterday.  His renal function numbers are improving as well as a white blood cell count.  We will continue antibiotics.   Family wants to hold off on PEG tube placement and want SLP to reevaluate today and then if he fails we will proceed with that on Monday.  Urology reevaluating given his scrotal edema and erythema edema and bruising.  We will increase his free water flushes but patient more interactive and will continue monitor for signs and symptoms of volume overload.  Patient appeared comfortable and much more awake compared to yesterday.  Objective: Vitals:   05/06/20 0413 05/06/20 0757 05/06/20 1154 05/06/20 1620  BP:  118/70 109/73 128/75  Pulse:  82 (!) 109 (!) 102  Resp:  18 16 18   Temp:  99.1 F (37.3 C) 98.9 F (37.2 C) 98.6 F (37 C)  TempSrc:  Axillary Oral Oral  SpO2:  94% 95% 95%  Weight: 88.3 kg     Height:        Intake/Output Summary (Last 24 hours) at 05/06/2020 1642 Last data filed at 05/06/2020 1049 Gross per 24 hour  Intake 1151.44 ml  Output 2300 ml  Net -1148.56 ml   Filed Weights   05/01/20 0453 05/05/20 0500 05/06/20 0413  Weight: 87.1 kg 85.5 kg 88.3 kg   Examination: Physical Exam:  Constitutional: The patient is an ill-appearing Caucasian male currently in no acute distress and is much more awake today than he was yesterday and more interactive Eyes: Lids and conjunctivae normal, sclerae anicteric  ENMT: External Ears, Nose appear normal. Grossly normal hearing.  Neck: Appears normal, supple, no cervical masses, normal ROM, no appreciable thyromegaly: No JVD Respiratory: Diminished to auscultation bilaterally with coarse breath sounds, no wheezing, rales, rhonchi or crackles.  Slightly tachypneic and wearing supplemental oxygen via nasal cannula. Cardiovascular: RRR, no murmurs / rubs / gallops. S1 and S2 auscultated.  Has some mild lower extremity edema Abdomen: Soft, non-tender, non-distended.  Bowel sounds positive.  GU: Scrotum is swollen and he does have some erythema and bruising.  Foley catheter is in place Musculoskeletal: No clubbing / cyanosis of  digits/nails. Skin: No rashes, lesions, ulcers or limited skin evaluation but he has some bruising and ecchymosis on his scrotum. No induration; Warm and dry.  Neurologic: CN 2-12 grossly intact with no focal deficits but does have some hemiplegia and right upper arm flaccidity does have some hemiplegia  Psychiatric: Normal judgment and insight.  He is awake and  more alert today. Normal mood and appropriate affect.   Data Reviewed: I have personally reviewed following labs and imaging studies  CBC: Recent Labs  Lab 05/03/20 0430 05/04/20 0225 05/04/20 1326 05/05/20 0302 05/06/20 0400  WBC 9.1 12.3* 15.4* 13.2* 8.8  NEUTROABS  --  10.6* 13.5* 10.7* 6.3  HGB 10.4* 10.2* 10.8* 10.9* 10.6*  HCT 32.2* 31.2* 34.2* 33.6* 31.9*  MCV 92.8 92.6 95.0 92.1 91.9  PLT 233 256 271 294 [id number]   Basic Metabolic Panel: Recent Labs  Lab 05/02/20 0741 05/03/20 0430 05/03/20 1310 05/04/20 0225 05/04/20 1326 05/05/20 0302 05/06/20 0400  NA 145   < > 143 140 144 145 150*  K 3.4*   < > 4.1 4.2 4.4 3.9 4.1  CL 115*   < > 115* 113* 114* 112* 115*  CO2 22   < > 20* 20* 21* 23 26  GLUCOSE 132*   < > 154* 173* 131* 144* 132*  BUN 29*   < > 44* 53* 55* 55* 48*  CREATININE 1.30*   < > 1.50* 1.79* 2.12* 1.81* 1.24  CALCIUM 8.2*   < > 7.9* 7.5* 8.0* 8.2* 8.4*  MG 2.1  --   --  1.8 1.9 1.9 1.8  PHOS  --   --   --  2.9 3.8 3.9 4.6   < > = values in this interval not displayed.   GFR: Estimated Creatinine Clearance: 54.3 mL/min (by C-G formula based on SCr of 1.24 mg/dL). Liver Function Tests: Recent Labs  Lab 05/03/20 0430 05/04/20 0225 05/04/20 1326 05/05/20 0302 05/06/20 0400  AST 34 23 19 26  58*  ALT 35 32 30 35 60*  ALKPHOS 30* 30* 29* 31* 32*  BILITOT 0.6 1.0 1.1 0.6 0.8  PROT 4.6* 4.8* 5.4* 5.4* 5.2*  ALBUMIN 2.4* 2.3* 2.4* 2.5* 2.3*   Recent Labs  Lab 05/04/20 1326  LIPASE 49   No results for input(s): AMMONIA in the last 168 hours. Coagulation Profile: Recent Labs  Lab  05/02/20 0242 05/03/20 0430 05/04/20 0225 05/05/20 0302 05/06/20 0400  INR 1.9* 2.5* 1.8* 1.6* 1.5*   Cardiac Enzymes: Recent Labs  Lab 05/03/20 0430  CKTOTAL 124   BNP (last 3 results) No results for input(s): PROBNP in the last 8760 hours. HbA1C: No results for input(s): HGBA1C in the last 72 hours. CBG: Recent Labs  Lab 05/05/20 2353 05/06/20 0347 05/06/20 0801 05/06/20 1143 05/06/20 1614  GLUCAP 123* 117* 137* 126* 135*   Lipid Profile: No results for input(s): CHOL, HDL, LDLCALC, TRIG, CHOLHDL, LDLDIRECT in the last 72 hours. Thyroid Function Tests: No results for input(s): TSH, T4TOTAL, FREET4, T3FREE, THYROIDAB in the last 72 hours. Anemia Panel: No results for input(s): VITAMINB12, FOLATE, FERRITIN, TIBC, IRON, RETICCTPCT in the last 72 hours. Sepsis Labs: No results for input(s): PROCALCITON, LATICACIDVEN in the last 168 hours.  Recent Results (from the past 240 hour(s))  MRSA PCR Screening     Status: None   Collection Time: 04/27/20  3:39 PM   Specimen: Nasopharyngeal  Result Value Ref Range Status   MRSA by PCR NEGATIVE NEGATIVE Final    Comment:        The GeneXpert MRSA Assay (FDA approved for NASAL specimens only), is one component of a comprehensive MRSA colonization surveillance program. It is not intended to diagnose MRSA infection nor to guide or monitor treatment for MRSA infections. Performed at [facility], [place] [address], [place], [place] [address]   Culture, Urine  Status: Abnormal   Collection Time: 05/03/20 10:42 AM   Specimen: Urine, Random  Result Value Ref Range Status   Specimen Description URINE, RANDOM  Final   Special Requests   Final    NONE Performed at [facility], [address], [place], [place] [address]    Culture >=100,000 COLONIES/mL KLEBSIELLA OXYTOCA (A)  Final   Report Status 05/05/2020 FINAL  Final   Organism ID, Bacteria KLEBSIELLA OXYTOCA (A)  Final      Susceptibility   Klebsiella  oxytoca - MIC*    AMPICILLIN RESISTANT Resistant     CEFAZOLIN 8 SENSITIVE Sensitive     CEFEPIME <=0.12 SENSITIVE Sensitive     CEFTRIAXONE <=0.25 SENSITIVE Sensitive     CIPROFLOXACIN <=0.25 SENSITIVE Sensitive     GENTAMICIN <=1 SENSITIVE Sensitive     IMIPENEM <=0.25 SENSITIVE Sensitive     NITROFURANTOIN <=16 SENSITIVE Sensitive     TRIMETH/SULFA <=20 SENSITIVE Sensitive     AMPICILLIN/SULBACTAM 4 SENSITIVE Sensitive     PIP/TAZO <=4 SENSITIVE Sensitive     * >=100,000 COLONIES/mL KLEBSIELLA OXYTOCA  Culture, blood (routine x 2)     Status: None (Preliminary result)   Collection Time: 05/04/20  4:46 PM   Specimen: BLOOD  Result Value Ref Range Status   Specimen Description BLOOD BLOOD LEFT FOREARM  Final   Special Requests   Final    BOTTLES DRAWN AEROBIC ONLY Blood Culture adequate volume   Culture  Setup Time   Final    AEROBIC BOTTLE ONLY GRAM POSITIVE COCCI Organism ID to follow CRITICAL RESULT CALLED TO, READ BACK BY AND VERIFIED WITHShela Commons [facility] 05/06/20 0615 JDW Performed at [facility], [address], [place], [place] [address]    Culture PENDING  Incomplete   Report Status PENDING  Incomplete  Blood Culture ID Panel (Reflexed)     Status: Abnormal   Collection Time: 05/04/20  4:46 PM  Result Value Ref Range Status   Enterococcus faecalis NOT DETECTED NOT DETECTED Final   Enterococcus Faecium NOT DETECTED NOT DETECTED Final   Listeria monocytogenes NOT DETECTED NOT DETECTED Final   Staphylococcus species DETECTED (A) NOT DETECTED Final    Comment: CRITICAL RESULT CALLED TO, READ BACK BY AND VERIFIED WITH: J LEDFORD PHARMD 05/06/20 0615 JDW    Staphylococcus aureus (BCID) NOT DETECTED NOT DETECTED Final   Staphylococcus epidermidis DETECTED (A) NOT DETECTED Final    Comment: Methicillin (oxacillin) resistant coagulase negative staphylococcus. Possible blood culture contaminant (unless isolated from more than one blood culture draw or clinical case  suggests pathogenicity). No antibiotic treatment is indicated for blood  culture contaminants. CRITICAL RESULT CALLED TO, READ BACK BY AND VERIFIED WITH: J LEDFORD [facility] 05/06/20 0615 JDW    Staphylococcus lugdunensis NOT DETECTED NOT DETECTED Final   Streptococcus species NOT DETECTED NOT DETECTED Final   Streptococcus agalactiae NOT DETECTED NOT DETECTED Final   Streptococcus pneumoniae NOT DETECTED NOT DETECTED Final   Streptococcus pyogenes NOT DETECTED NOT DETECTED Final   A.calcoaceticus-baumannii NOT DETECTED NOT DETECTED Final   Bacteroides fragilis NOT DETECTED NOT DETECTED Final   Enterobacterales NOT DETECTED NOT DETECTED Final   Enterobacter cloacae complex NOT DETECTED NOT DETECTED Final   Escherichia coli NOT DETECTED NOT DETECTED Final   Klebsiella aerogenes NOT DETECTED NOT DETECTED Final   Klebsiella oxytoca NOT DETECTED NOT DETECTED Final   Klebsiella pneumoniae NOT DETECTED NOT DETECTED Final   Proteus species NOT DETECTED NOT DETECTED Final   Salmonella  species NOT DETECTED NOT DETECTED Final   Serratia marcescens NOT DETECTED NOT DETECTED Final   Haemophilus influenzae NOT DETECTED NOT DETECTED Final   Neisseria meningitidis NOT DETECTED NOT DETECTED Final   Pseudomonas aeruginosa NOT DETECTED NOT DETECTED Final   Stenotrophomonas maltophilia NOT DETECTED NOT DETECTED Final   Candida albicans NOT DETECTED NOT DETECTED Final   Candida auris NOT DETECTED NOT DETECTED Final   Candida glabrata NOT DETECTED NOT DETECTED Final   Candida krusei NOT DETECTED NOT DETECTED Final   Candida parapsilosis NOT DETECTED NOT DETECTED Final   Candida tropicalis NOT DETECTED NOT DETECTED Final   Cryptococcus neoformans/gattii NOT DETECTED NOT DETECTED Final   Methicillin resistance mecA/C DETECTED (A) NOT DETECTED Final    Comment: CRITICAL RESULT CALLED TO, READ BACK BY AND VERIFIED WITHLenna Sciara [facility] 05/06/20 0615 JDW Performed at [facility], [address]  [place], [place] [address]   Culture, blood (routine x 2)     Status: None (Preliminary result)   Collection Time: 05/04/20  4:52 PM   Specimen: BLOOD LEFT HAND  Result Value Ref Range Status   Specimen Description BLOOD LEFT HAND  Final   Special Requests   Final    BOTTLES DRAWN AEROBIC ONLY Blood Culture adequate volume   Culture   Final    NO GROWTH 2 DAYS Performed at [facility], [place] [address], [place],  [address]    Report Status PENDING  Incomplete    RN Pressure Injury Documentation:     Estimated body mass index is 24.99 kg/m as calculated from the following:   Height as of this encounter: 6\' 2"  (1.88 m).   Weight as of this encounter: 88.3 kg.  Malnutrition Type:  Nutrition Problem: Inadequate oral intake Etiology: dysphagia   Malnutrition Characteristics:  Signs/Symptoms: NPO status   Nutrition Interventions:  Interventions: Tube feeding   Radiology Studies: MR BRAIN WO CONTRAST  Result Date: 05/05/2020 CLINICAL DATA:  Stroke, follow up EXAM: MRI HEAD WITHOUT CONTRAST TECHNIQUE: Multiplanar, multiecho pulse sequences of the brain and surrounding structures were obtained without intravenous contrast. COMPARISON:  04/28/2020 and prior. FINDINGS: Please note that limited sequence acquisition limits evaluation. Brain: Mild cerebral atrophy with ex vacuo dilatation. No midline shift, ventriculomegaly or extra-axial fluid collection. No intracranial hemorrhage. Redemonstration of multifocal subacute infarcts involving the left cerebrum, unchanged. Tiny DWI hyperintensity involving the right caudate head on prior exam is not demonstrated. No new acute/subacute infarct. Multifocal remote insults are better demonstrated on prior exam. Vascular: Not well evaluated. Skull and upper cervical spine: No focal abnormality. Sinuses/Orbits: Not well evaluated. Other: None. IMPRESSION: Multifocal subacute left cerebral infarcts, unchanged. No new infarct or intracranial  hemorrhage. Prior punctate right caudate head DWI hyperintensity is not demonstrated. Electronically Signed   By: Primitivo Gauze M.D.   On: 05/05/2020 15:25   US SCROTUM  Result Date: 05/05/2020 CLINICAL DATA:  79 year old with scrotal swelling. EXAM: ULTRASOUND OF SCROTUM TECHNIQUE: Complete ultrasound examination of the testicles, epididymis, and other scrotal structures was performed. COMPARISON:  None. FINDINGS: Right testicle Measurements: 4.2 x 2.1 x 2.9 cm. Rete tubular ectasia with small cysts at the mediastinum testis. No solid intra testicular mass. Normal blood flow. Left testicle Measurements: 3.3 x 1.7 x 2.5 cm. Testicular echotexture is heterogeneous. Ill-defined area of heterogeneously decreased echogenicity in the lateral testis without well-defined borders measures approximately 1.2 x 0.8 x 1.3 cm. Similar internal vascularity to the adjacent testicular parenchyma. There is an intratesticular cyst  laterally measuring 6 mm. Normal parenchymal blood flow. Right epididymis: Tiny epididymal head cyst. There is also a tiny cyst in the mid epididymal body. No solid lesion or hyperemia. Left epididymis:  Epididymal head cyst measuring 8 mm. Hydrocele:  Small bilaterally, right greater than left. Varicocele:  Present bilaterally. Other: Bilateral scrotal skin thickening. No subcutaneous collection. IMPRESSION: 1. Bilateral scrotal skin thickening. 2. Right rete tubular ectasia. 3. There is an intra testicular ill-defined heterogeneously hypoechoic area in the left lateral testicle measuring approximately 1.3 cm with some internal blood flow. This has nonspecific imaging features. Differential considerations include unusual appearance of rete testis versus intra testicular mass. Presence of internal blood flow favors against testicular hematoma or segmental infarct. Recommend urologic consultation and potentially MRI for more detailed evaluation. There is also a subcentimeter left intra testicular  cyst. 4. Small bilateral hydroceles.  Bilateral varicoceles. 5. Bilateral epididymal head cysts. Electronically Signed   By: Keith Rake M.D.   On: 05/05/2020 19:08   DG CHEST PORT 1 VIEW  Result Date: 05/05/2020 CLINICAL DATA:  Shortness of breath. EXAM: PORTABLE CHEST 1 VIEW COMPARISON:  CT chest, abdomen and pelvis and single view of the chest 05/04/2020. FINDINGS: Lungs are clear. Heart size is normal. No pneumothorax or pleural effusion. IMPRESSION: No acute disease. Electronically Signed   By: Inge Rise M.D.   On: 05/05/2020 10:37   EEG adult  Result Date: 05/05/2020 Lora Havens, MD     05/05/2020  1:47 PM Patient Name: ALEXUS KACZANOWSKI MRN: [id number] Epilepsy Attending: Lora Havens Referring Physician/Provider: Dr Rosalin Hawking Date: 05/05/2020 Duration: 24.53 mins Patient history: 79yo M with left side stroke. EEG to evaluate for seizure Level of alertness: Awake, asleep AEDs during EEG study: None Technical aspects: This EEG study was done with scalp electrodes positioned according to the 10-20 International system of electrode placement. Electrical activity was acquired at a sampling rate of 500Hz  and reviewed with a high frequency filter of 70Hz  and a low frequency filter of 1Hz . EEG data were recorded continuously and digitally stored. Description: The posterior dominant rhythm consists of 8 Hz activity of moderate voltage (25-35 uV) seen predominantly in posterior head regions, symmetric and reactive to eye opening and eye closing.Sleep was characterized by vertex waves, maximal frontocentral region EEG showed intermittent 3-5hz  theta-delta slowing in left hemisphere, maximal left frontotemporal region. Hyperventilation and photic stimulation were not performed.  ABNORMALITY -Intermiitent slow, left hemisphere, maximal left frontotemporal region  IMPRESSION: This study is suggestive of cortical dysfunction in left hemisphere, maximal left fronto-temporal region likely  secondary to underlying stroke/structural abnormality. No seizures or epileptiform discharges were seen throughout the recording. EEG appears to be improving to previous study.  Priyanka Barbra Sarks   Scheduled Meds: . aspirin  81 mg Per Tube Daily  . carvedilol  25 mg Per Tube BID WC  . Chlorhexidine Gluconate Cloth  6 each Topical Daily  . clopidogrel  75 mg Per Tube Daily  . feeding supplement (PROSource TF)  45 mL Per Tube BID  . finasteride  5 mg Oral Daily  . free water  175 mL Per Tube Q2H  . insulin aspart  0-9 Units Subcutaneous Q4H  . pravastatin  40 mg Per Tube Daily  . Warfarin - Pharmacist Dosing Inpatient   Does not apply q1600   Continuous Infusions: . cefTRIAXone (ROCEPHIN)  IV 1 g (05/06/20 1038)  . feeding supplement (JEVITY 1.5 CAL/FIBER) 55 mL/hr at 05/05/20 1301    LOS: 15  days   Kerney Elbe, DO [facility] PAGER is on AMION  If 7PM-7AM, please contact night-coverage [email or web address]

## 2020-05-06 NOTE — Progress Notes (Signed)
9 Days Post-Op   Subjective/Chief Complaint:  1 - Gross Hematuria- new blood in urine with few small clots after being on multiple anti-platelet agents during admission for CVA. CT 12/29 w/o stones, hydro, upper tract masses. ON coumadin gt baseline.   2 - Urinary Retention / Acute Renal Failure - Cr rise to low 2's from normal baseline. Distended bladder on [place] and catheter placed with Cr improvement. Prostate 60gm (age normal). Placed on finasteride + tamsulosin this admission.  3 - Scrotal Ecchymoses / Edema - pt with some variable scrotal edema during complex admission for CVA. Some purple discoloration noted 12/30 and exam c/w skin ecchymoses only. NO hematomas / or cutaneous infectious signs.   4 - Rule Out Left Testis Neoplasm - hypoechoic area in left testis incidetnal on scrotal US 05/05/20. No retroperitoneal adenopathy on CT. No palpable masses on exam. He has very poor prognosis from large CVA.  Today "John Lambert" is seen in f/u above and opinoin on incidetnal left testicular abnormality and some scrotal discoloration.    Objective: Vital signs in last 24 hours: Temp:  [98.5 F (36.9 C)-99.2 F (37.3 C)] 99.1 F (37.3 C) (12/31 0757) Pulse Rate:  [78-113] 82 (12/31 0757) Resp:  [17-19] 18 (12/31 0757) BP: (95-118)/(61-70) 118/70 (12/31 0757) SpO2:  [90 %-95 %] 94 % (12/31 0757) Weight:  [88.3 kg] 88.3 kg (12/31 0413) Last BM Date: 05/03/20  Intake/Output from previous day: 12/30 0701 - 12/31 0700 In: 1151.4 [NG/GT:878; IV Piggyback:273.4] Out: 2800 [Urine:2800] Intake/Output this shift: Total I/O In: -  Out: 700 [Urine:700]  General appearance: Very frail and ill appearing. Family at bedside who are very helpful with history.  Eyes: negative Nose: DHT in place.  Throat: Multiple missing teeth and poor dentition.  Back: symmetric, no curvature. ROM normal. No CVA tenderness. Resp: Non-labored Cardio: Nl rate at present.  GI: soft, non-tender; bowel sounds normal; no  masses,  no organomegaly Male genitalia: Foley in place wtih medium yellow urine that is non-foul. NO clots or gross blood. Soft scrotal ecchymoses w/o tense edema / fluctuence / crepitus / drainage / blanching / infectious signs.  Extremities: some wasting of all extremities, some LE edema that is not severe.  Lymph nodes: Cervical, supraclavicular, and axillary nodes normal.  Lab Results:  Recent Labs    05/05/20 0302 05/06/20 0400  WBC 13.2* 8.8  HGB 10.9* 10.6*  HCT 33.6* 31.9*  PLT 294 299   BMET Recent Labs    05/05/20 0302 05/06/20 0400  NA 145 150*  K 3.9 4.1  CL 112* 115*  CO2 23 26  GLUCOSE 144* 132*  BUN 55* 48*  CREATININE 1.81* 1.24  CALCIUM 8.2* 8.4*   PT/INR Recent Labs    05/05/20 0302 05/06/20 0400  LABPROT 18.4* 17.2*  INR 1.6* 1.5*   ABG No results for input(s): PHART, HCO3 in the last 72 hours.  Invalid input(s): PCO2, PO2  Studies/Results: MR BRAIN WO CONTRAST  Result Date: 05/05/2020 CLINICAL DATA:  Stroke, follow up EXAM: MRI HEAD WITHOUT CONTRAST TECHNIQUE: Multiplanar, multiecho pulse sequences of the brain and surrounding structures were obtained without intravenous contrast. COMPARISON:  04/28/2020 and prior. FINDINGS: Please note that limited sequence acquisition limits evaluation. Brain: Mild cerebral atrophy with ex vacuo dilatation. No midline shift, ventriculomegaly or extra-axial fluid collection. No intracranial hemorrhage. Redemonstration of multifocal subacute infarcts involving the left cerebrum, unchanged. Tiny DWI hyperintensity involving the right caudate head on prior exam is not demonstrated. No new acute/subacute infarct. Multifocal remote  insults are better demonstrated on prior exam. Vascular: Not well evaluated. Skull and upper cervical spine: No focal abnormality. Sinuses/Orbits: Not well evaluated. Other: None. IMPRESSION: Multifocal subacute left cerebral infarcts, unchanged. No new infarct or intracranial hemorrhage.  Prior punctate right caudate head DWI hyperintensity is not demonstrated. Electronically Signed   By: Primitivo Gauze M.D.   On: 05/05/2020 15:25   US SCROTUM  Result Date: 05/05/2020 CLINICAL DATA:  79 year old with scrotal swelling. EXAM: ULTRASOUND OF SCROTUM TECHNIQUE: Complete ultrasound examination of the testicles, epididymis, and other scrotal structures was performed. COMPARISON:  None. FINDINGS: Right testicle Measurements: 4.2 x 2.1 x 2.9 cm. Rete tubular ectasia with small cysts at the mediastinum testis. No solid intra testicular mass. Normal blood flow. Left testicle Measurements: 3.3 x 1.7 x 2.5 cm. Testicular echotexture is heterogeneous. Ill-defined area of heterogeneously decreased echogenicity in the lateral testis without well-defined borders measures approximately 1.2 x 0.8 x 1.3 cm. Similar internal vascularity to the adjacent testicular parenchyma. There is an intratesticular cyst laterally measuring 6 mm. Normal parenchymal blood flow. Right epididymis: Tiny epididymal head cyst. There is also a tiny cyst in the mid epididymal body. No solid lesion or hyperemia. Left epididymis:  Epididymal head cyst measuring 8 mm. Hydrocele:  Small bilaterally, right greater than left. Varicocele:  Present bilaterally. Other: Bilateral scrotal skin thickening. No subcutaneous collection. IMPRESSION: 1. Bilateral scrotal skin thickening. 2. Right rete tubular ectasia. 3. There is an intra testicular ill-defined heterogeneously hypoechoic area in the left lateral testicle measuring approximately 1.3 cm with some internal blood flow. This has nonspecific imaging features. Differential considerations include unusual appearance of rete testis versus intra testicular mass. Presence of internal blood flow favors against testicular hematoma or segmental infarct. Recommend urologic consultation and potentially MRI for more detailed evaluation. There is also a subcentimeter left intra testicular cyst. 4.  Small bilateral hydroceles.  Bilateral varicoceles. 5. Bilateral epididymal head cysts. Electronically Signed   By: Keith Rake M.D.   On: 05/05/2020 19:08   DG CHEST PORT 1 VIEW  Result Date: 05/05/2020 CLINICAL DATA:  Shortness of breath. EXAM: PORTABLE CHEST 1 VIEW COMPARISON:  CT chest, abdomen and pelvis and single view of the chest 05/04/2020. FINDINGS: Lungs are clear. Heart size is normal. No pneumothorax or pleural effusion. IMPRESSION: No acute disease. Electronically Signed   By: Inge Rise M.D.   On: 05/05/2020 10:37   EEG adult  Result Date: 05/05/2020 Lora Havens, MD     05/05/2020  1:47 PM Patient Name: John Lambert MRN: [id number] Epilepsy Attending: Lora Havens Referring Physician/Provider: Dr Rosalin Hawking Date: 05/05/2020 Duration: 24.53 mins Patient history: 79yo M with left side stroke. EEG to evaluate for seizure Level of alertness: Awake, asleep AEDs during EEG study: None Technical aspects: This EEG study was done with scalp electrodes positioned according to the 10-20 International system of electrode placement. Electrical activity was acquired at a sampling rate of 500Hz  and reviewed with a high frequency filter of 70Hz  and a low frequency filter of 1Hz . EEG data were recorded continuously and digitally stored. Description: The posterior dominant rhythm consists of 8 Hz activity of moderate voltage (25-35 uV) seen predominantly in posterior head regions, symmetric and reactive to eye opening and eye closing.Sleep was characterized by vertex waves, maximal frontocentral region EEG showed intermittent 3-5hz  theta-delta slowing in left hemisphere, maximal left frontotemporal region. Hyperventilation and photic stimulation were not performed.  ABNORMALITY -Intermiitent slow, left hemisphere, maximal left frontotemporal region  IMPRESSION: This  study is suggestive of cortical dysfunction in left hemisphere, maximal left fronto-temporal region likely secondary to  underlying stroke/structural abnormality. No seizures or epileptiform discharges were seen throughout the recording. EEG appears to be improving to previous study.  Priyanka Barbra Sarks    Anti-infectives: Anti-infectives (From admission, onward)   Start     Dose/Rate Route Frequency Ordered Stop   05/04/20 0945  azithromycin (ZITHROMAX) 500 mg in sodium chloride 0.9 % 250 mL IVPB  Status:  Discontinued        500 mg 250 mL/hr over 60 Minutes Intravenous Every 24 hours 05/04/20 0859 05/05/20 0956   05/04/20 0900  cefTRIAXone (ROCEPHIN) 1 g in sodium chloride 0.9 % 100 mL IVPB        1 g 200 mL/hr over 30 Minutes Intravenous Every 24 hours 05/04/20 0803     04/27/20 0600  ceFAZolin (ANCEF) IVPB 2g/100 mL premix  Status:  Discontinued       Note to Pharmacy: Send with pt to OR   2 g 200 mL/hr over 30 Minutes Intravenous On call 04/26/20 0805 04/26/20 2018   04/27/20 0600  ceFAZolin (ANCEF) IVPB 2g/100 mL premix        2 g 200 mL/hr over 30 Minutes Intravenous 30 min pre-op 04/26/20 2013 04/27/20 0804   04/27/20 0000  ceFAZolin (ANCEF) IVPB 1 g/50 mL premix  Status:  Discontinued       Note to Pharmacy: Send with pt to OR   1 g 100 mL/hr over 30 Minutes Intravenous On call 04/26/20 0800 04/26/20 0805      Assessment/Plan:  1 - Gross Hematuria- most likely prostate source bleeding in setting of necessary anticoagulation / antiplatlent therapy.   2 - Urinary Retention / Acute Renal Failure - likely from small clots / prostate source bleeding. The hematuria has resolved. Rec begin and continue finasteride to help reduce liklihood of protate source bleeding or ongong retention. I feel OK to try foley removal day or two before discharge as urine now completely clear v. OK to leave at DC and then outpatient trial of void at our office if that is more convenient.   3 - Scrotal Ecchymoses / Edema- although unsightly, exam and eval reassuring. This is just simply cutaneous bruising. This will take  likely few weeks for color change to resolve and like any bruise may turn yellowish or slightly green as the heme breaks down. Fortunately no infectious or hematoma signs on exam.   4 - Rule Out Left Testis Neoplasm - Low suspicion of clinically significant malignancy. Given his h/o infarction / embolization, I feel that is much more likely DDX. No firm mass on exam. Only consdier outpatent [place] in several months when at new functional baseline to verify stability.   Alexis Frock 05/06/2020

## 2020-05-06 NOTE — Progress Notes (Signed)
PHARMACY - PHYSICIAN COMMUNICATION CRITICAL VALUE ALERT - BLOOD CULTURE IDENTIFICATION (BCID)  John Lambert is an 79 y.o. male who presented to [facility] on 04/19/2020 with a chief complaint of weakness  Assessment:  WBC WNL, afebrile  Name of physician (or Provider) Contacted: Dr. Antionette Char  Current antibiotics: Ceftriaxone for UTI  Changes to prescribed antibiotics recommended:  None, likely contaminant   Results for orders placed or performed during the hospital encounter of 04/19/20  Blood Culture ID Panel (Reflexed) (Collected: 05/04/2020  4:46 PM)  Result Value Ref Range   Enterococcus faecalis NOT DETECTED NOT DETECTED   Enterococcus Faecium NOT DETECTED NOT DETECTED   Listeria monocytogenes NOT DETECTED NOT DETECTED   Staphylococcus species DETECTED (A) NOT DETECTED   Staphylococcus aureus (BCID) NOT DETECTED NOT DETECTED   Staphylococcus epidermidis DETECTED (A) NOT DETECTED   Staphylococcus lugdunensis NOT DETECTED NOT DETECTED   Streptococcus species NOT DETECTED NOT DETECTED   Streptococcus agalactiae NOT DETECTED NOT DETECTED   Streptococcus pneumoniae NOT DETECTED NOT DETECTED   Streptococcus pyogenes NOT DETECTED NOT DETECTED   A.calcoaceticus-baumannii NOT DETECTED NOT DETECTED   Bacteroides fragilis NOT DETECTED NOT DETECTED   Enterobacterales NOT DETECTED NOT DETECTED   Enterobacter cloacae complex NOT DETECTED NOT DETECTED   Escherichia coli NOT DETECTED NOT DETECTED   Klebsiella aerogenes NOT DETECTED NOT DETECTED   Klebsiella oxytoca NOT DETECTED NOT DETECTED   Klebsiella pneumoniae NOT DETECTED NOT DETECTED   Proteus species NOT DETECTED NOT DETECTED   Salmonella species NOT DETECTED NOT DETECTED   Serratia marcescens NOT DETECTED NOT DETECTED   Haemophilus influenzae NOT DETECTED NOT DETECTED   Neisseria meningitidis NOT DETECTED NOT DETECTED   Pseudomonas aeruginosa NOT DETECTED NOT DETECTED   Stenotrophomonas maltophilia NOT DETECTED NOT DETECTED    Candida albicans NOT DETECTED NOT DETECTED   Candida auris NOT DETECTED NOT DETECTED   Candida glabrata NOT DETECTED NOT DETECTED   Candida krusei NOT DETECTED NOT DETECTED   Candida parapsilosis NOT DETECTED NOT DETECTED   Candida tropicalis NOT DETECTED NOT DETECTED   Cryptococcus neoformans/gattii NOT DETECTED NOT DETECTED   Methicillin resistance mecA/C DETECTED (A) NOT DETECTED    Abran Duke 05/06/2020  6:25 AM

## 2020-05-07 ENCOUNTER — Inpatient Hospital Stay (HOSPITAL_COMMUNITY): Payer: Medicare Other

## 2020-05-07 DIAGNOSIS — R531 Weakness: Secondary | ICD-10-CM | POA: Diagnosis not present

## 2020-05-07 DIAGNOSIS — D689 Coagulation defect, unspecified: Secondary | ICD-10-CM | POA: Diagnosis not present

## 2020-05-07 DIAGNOSIS — I10 Essential (primary) hypertension: Secondary | ICD-10-CM | POA: Diagnosis not present

## 2020-05-07 DIAGNOSIS — Z7901 Long term (current) use of anticoagulants: Secondary | ICD-10-CM | POA: Diagnosis not present

## 2020-05-07 DIAGNOSIS — Z515 Encounter for palliative care: Secondary | ICD-10-CM | POA: Diagnosis not present

## 2020-05-07 DIAGNOSIS — Z7189 Other specified counseling: Secondary | ICD-10-CM | POA: Diagnosis not present

## 2020-05-07 LAB — PROTIME-INR
INR: 1.5 — ABNORMAL HIGH (ref 0.8–1.2)
Prothrombin Time: 17.9 seconds — ABNORMAL HIGH (ref 11.4–15.2)

## 2020-05-07 LAB — GLUCOSE, CAPILLARY
Glucose-Capillary: 106 mg/dL — ABNORMAL HIGH (ref 70–99)
Glucose-Capillary: 112 mg/dL — ABNORMAL HIGH (ref 70–99)
Glucose-Capillary: 123 mg/dL — ABNORMAL HIGH (ref 70–99)
Glucose-Capillary: 126 mg/dL — ABNORMAL HIGH (ref 70–99)
Glucose-Capillary: 142 mg/dL — ABNORMAL HIGH (ref 70–99)
Glucose-Capillary: 148 mg/dL — ABNORMAL HIGH (ref 70–99)

## 2020-05-07 LAB — CULTURE, BLOOD (ROUTINE X 2): Special Requests: ADEQUATE

## 2020-05-07 LAB — COMPREHENSIVE METABOLIC PANEL
ALT: 97 U/L — ABNORMAL HIGH (ref 0–44)
AST: 100 U/L — ABNORMAL HIGH (ref 15–41)
Albumin: 2.4 g/dL — ABNORMAL LOW (ref 3.5–5.0)
Alkaline Phosphatase: 37 U/L — ABNORMAL LOW (ref 38–126)
Anion gap: 10 (ref 5–15)
BUN: 38 mg/dL — ABNORMAL HIGH (ref 8–23)
CO2: 25 mmol/L (ref 22–32)
Calcium: 8.5 mg/dL — ABNORMAL LOW (ref 8.9–10.3)
Chloride: 111 mmol/L (ref 98–111)
Creatinine, Ser: 0.97 mg/dL (ref 0.61–1.24)
GFR, Estimated: >60 mL/min (ref >60)
Glucose, Bld: 114 mg/dL — ABNORMAL HIGH (ref 70–99)
Potassium: 4.7 mmol/L (ref 3.5–5.1)
Sodium: 146 mmol/L — ABNORMAL HIGH (ref 135–145)
Total Bilirubin: 1.3 mg/dL — ABNORMAL HIGH (ref 0.3–1.2)
Total Protein: 5.6 g/dL — ABNORMAL LOW (ref 6.5–8.1)

## 2020-05-07 LAB — CBC WITH DIFFERENTIAL/PLATELET
Abs Immature Granulocytes: 0.08 10*3/uL — ABNORMAL HIGH (ref 0.00–0.07)
Basophils Absolute: 0.1 10*3/uL (ref 0.0–0.1)
Basophils Relative: 1 %
Eosinophils Absolute: 0.4 10*3/uL (ref 0.0–0.5)
Eosinophils Relative: 3 %
HCT: 35.6 % — ABNORMAL LOW (ref 39.0–52.0)
Hemoglobin: 11.3 g/dL — ABNORMAL LOW (ref 13.0–17.0)
Immature Granulocytes: 1 %
Lymphocytes Relative: 12 %
Lymphs Abs: 1.3 10*3/uL (ref 0.7–4.0)
MCH: 29.6 pg (ref 26.0–34.0)
MCHC: 31.7 g/dL (ref 30.0–36.0)
MCV: 93.2 fL (ref 80.0–100.0)
Monocytes Absolute: 0.8 10*3/uL (ref 0.1–1.0)
Monocytes Relative: 8 %
Neutro Abs: 8.1 10*3/uL — ABNORMAL HIGH (ref 1.7–7.7)
Neutrophils Relative %: 75 %
Platelets: 392 10*3/uL (ref 150–400)
RBC: 3.82 MIL/uL — ABNORMAL LOW (ref 4.22–5.81)
RDW: 13.9 % (ref 11.5–15.5)
WBC: 10.7 10*3/uL — ABNORMAL HIGH (ref 4.0–10.5)
nRBC: 0 % (ref 0.0–0.2)

## 2020-05-07 LAB — MAGNESIUM: Magnesium: 2.1 mg/dL (ref 1.7–2.4)

## 2020-05-07 LAB — PHOSPHORUS: Phosphorus: 4.1 mg/dL (ref 2.5–4.6)

## 2020-05-07 IMAGING — US US ABDOMEN LIMITED
1 series · 14 of 25 positions shown · non-contrast
Comparison: [DATE].

CLINICAL DATA: Abnormal liver function tests.

EXAM:
ULTRASOUND ABDOMEN LIMITED RIGHT UPPER QUADRANT

[Series 1: us abdomen limited ruq (liver/gb) · 14 of 49 slices shown]
[im 1/49]
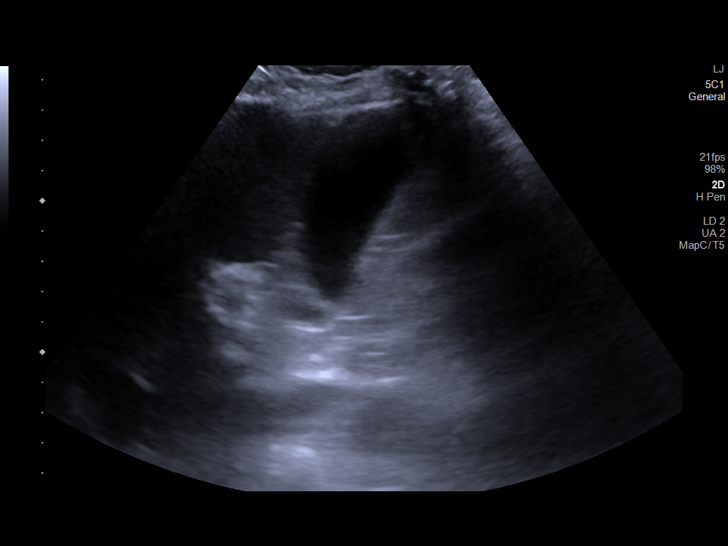
[im 5/49]
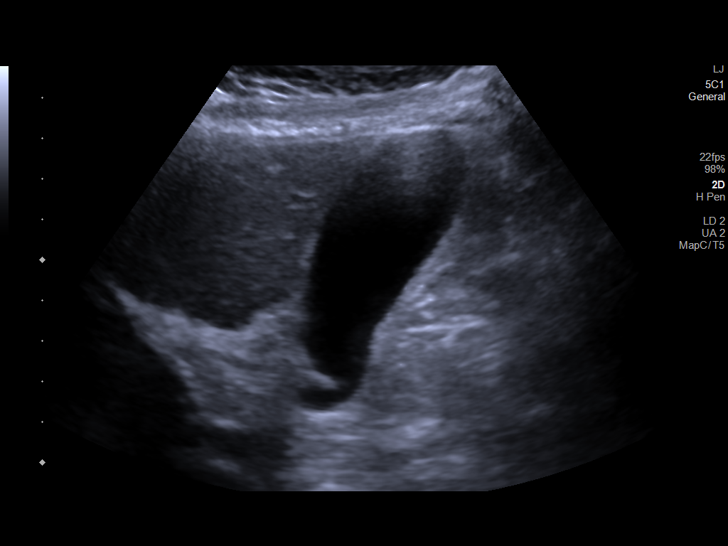
[im 9/49]
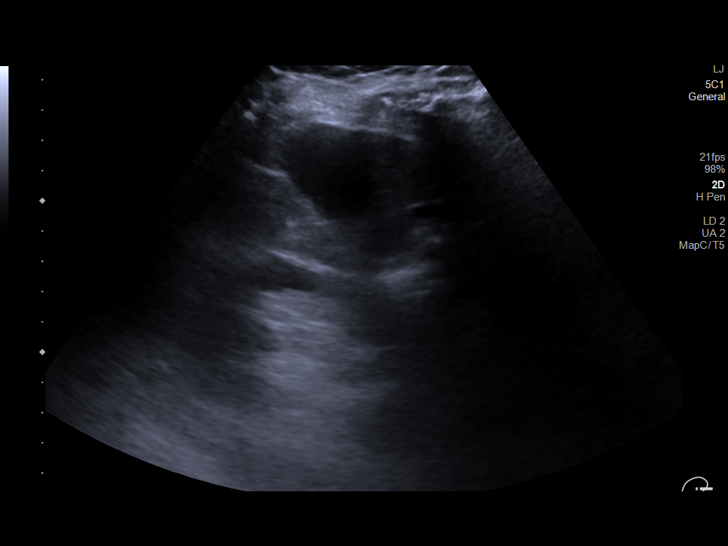
[im 13/49]
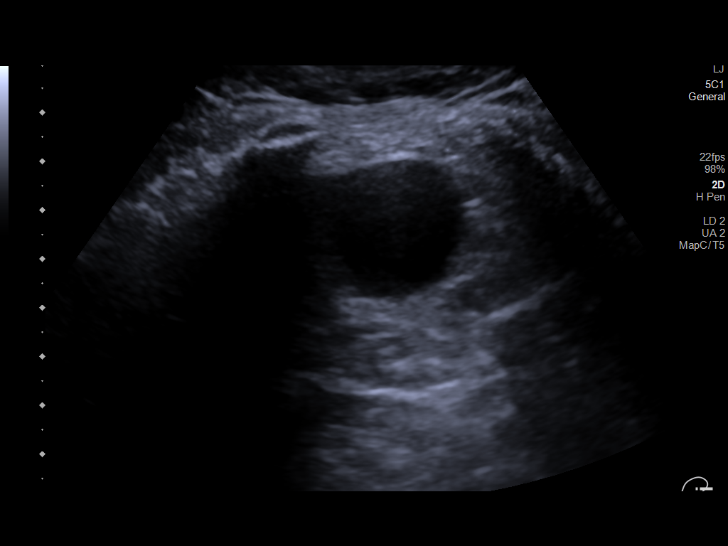
[im 17/49]
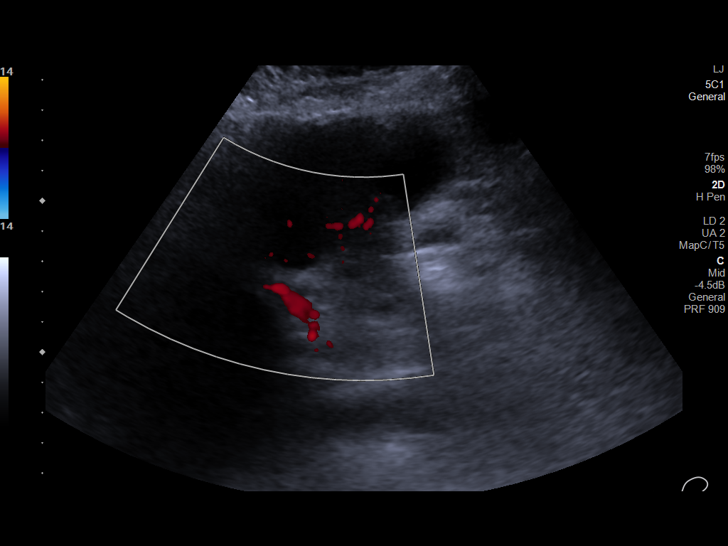
[im 19/49]
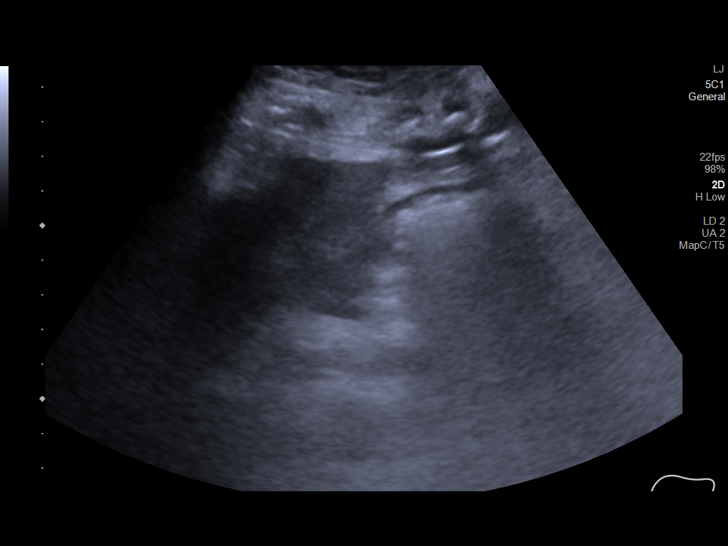
[im 23/49]
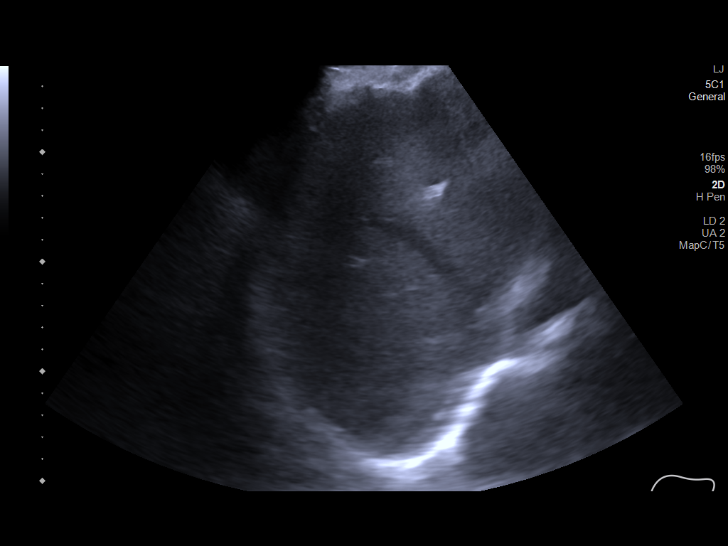
[im 27/49]
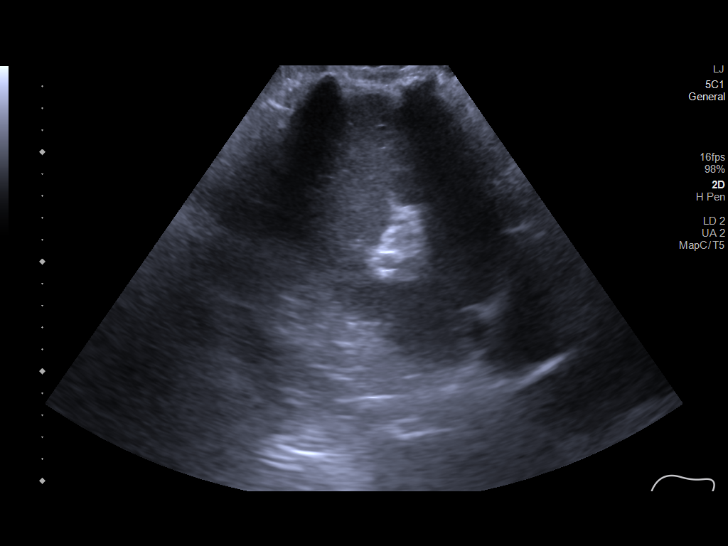
[im 31/49]
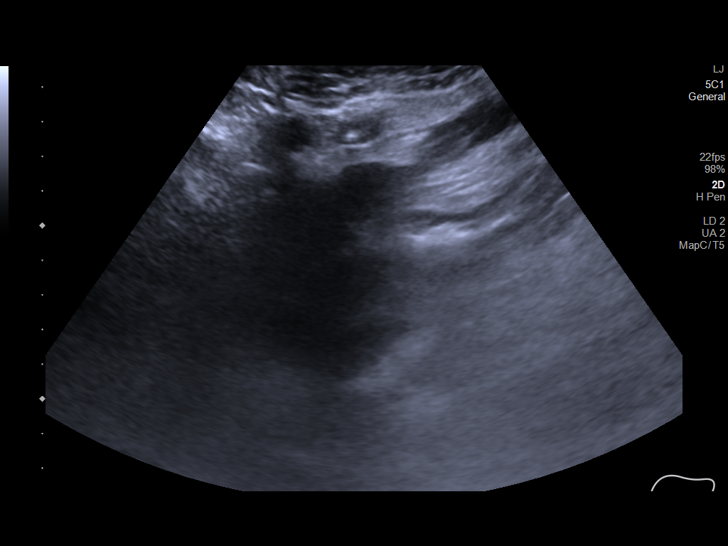
[im 33/49]
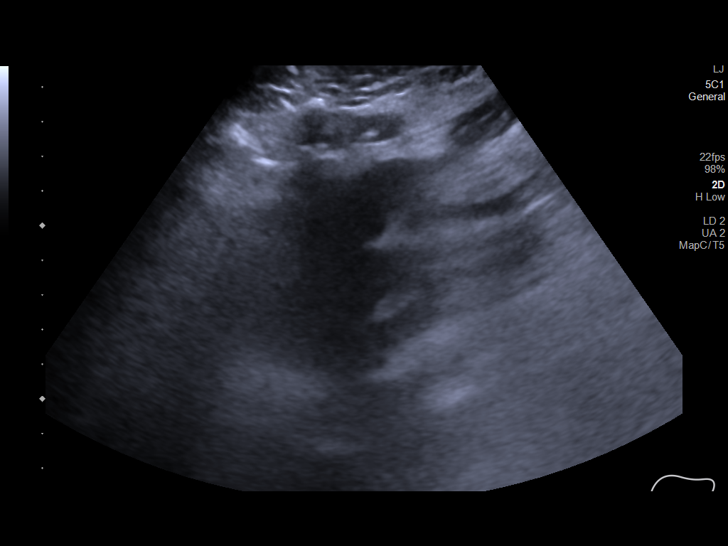
[im 37/49]
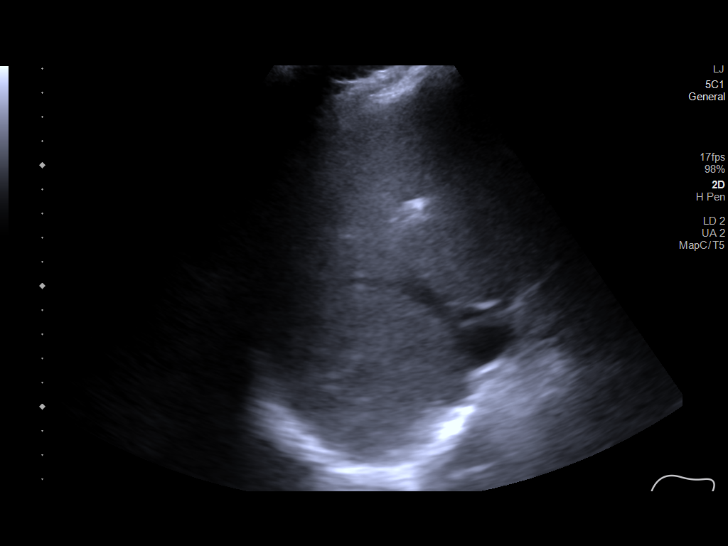
[im 41/49]
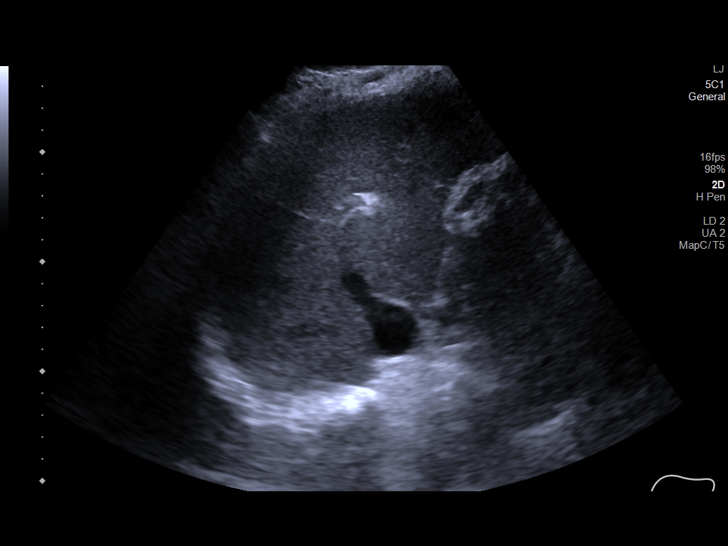
[im 45/49]
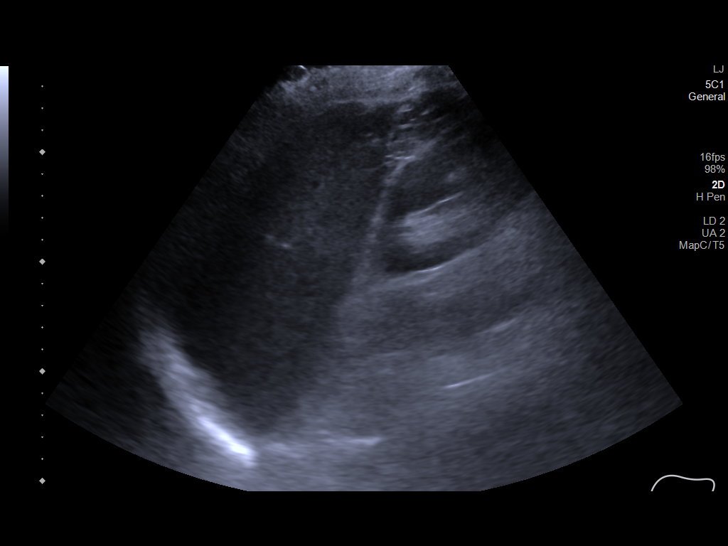
[im 49/49]
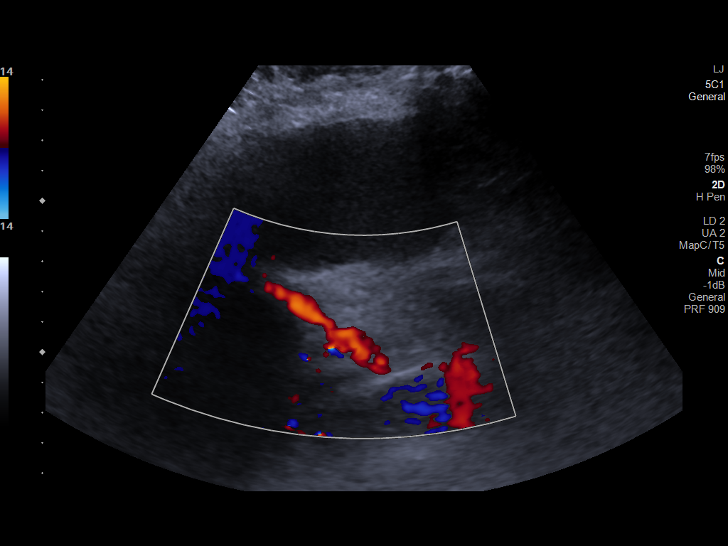

[14 of 25 positions shown; findings below may reference images not displayed]

FINDINGS: Gallbladder:

No gallstones or wall thickening visualized. No sonographic Murphy
sign noted by sonographer.

Common bile duct:

Diameter: 3 mm which is within normal limits.

Liver:

No focal lesion identified. Within normal limits in parenchymal
echogenicity. Portal vein is patent on color Doppler imaging with
normal direction of blood flow towards the liver.

Other: None.
IMPRESSION: No definite abnormality seen in the right upper quadrant of the
abdomen.

## 2020-05-07 MED ORDER — WARFARIN SODIUM 2.5 MG PO TABS
2.5000 mg | ORAL_TABLET | Freq: Once | ORAL | Status: AC
  Administered 2020-05-07: 2.5 mg
  Filled 2020-05-07: qty 1

## 2020-05-07 MED ORDER — ALBUMIN HUMAN 25 % IV SOLN
25.0000 g | Freq: Once | INTRAVENOUS | Status: AC
  Administered 2020-05-08: 25 g via INTRAVENOUS
  Filled 2020-05-07: qty 100

## 2020-05-07 MED ORDER — WARFARIN SODIUM 2.5 MG PO TABS: 2.5000 mg | ORAL_TABLET | Freq: Once | ORAL | Status: DC

## 2020-05-07 NOTE — Progress Notes (Signed)
ANTICOAGULATION CONSULT NOTE  Pharmacy Consult for Warfarin Indication: on PTA warfarin for hx of DVT/PE, new acute stroke, new Afib  Patient Measurements: Height: 6\' 2"  (188 cm) Weight: 86.8 kg (191 lb 5.8 oz) IBW/kg (Calculated) : 82.2   Vital Signs: Temp: 98.9 F (37.2 C) (01/01 0751) Temp Source: Axillary (01/01 0751) BP: 115/67 (01/01 0751) Pulse Rate: 96 (01/01 0751)  Labs: Recent Labs    05/05/20 0302 05/06/20 0400 05/06/20 1523 05/07/20 0207  HGB 10.9* 10.6*  --  11.3*  HCT 33.6* 31.9*  --  35.6*  PLT 294 299  --  392  LABPROT 18.4* 17.2*  --  17.9*  INR 1.6* 1.5*  --  1.5*  CREATININE 1.81* 1.24 1.11 0.97    Estimated Creatinine Clearance: 69.4 mL/min (by C-G formula based on SCr of 0.97 mg/dL).  Assessment: 80 yo male with L symptomatic carotid stenosis.  S/p L carotid TCAR procedure w/ stenting that was complicated by postprocedure stroke.  MRI brain shows patchy L basal ganglia and L frontoparietal cortical and subcortical infarcts (new).  No hemorrhagic transformation. New stroke and new onset atrial fibrillation.  Pharmacy consulted to resume warfarin.  Heparin infusion discontinued on 05/03/20. Warfarin held 12/28 and 12/29 due to hematuria and large jump in INR (1.9 >2.5).  Hematuria appears to be resolving and warfarin was restarted 12/30.  PTA warfarin dose: 2.5mg  daily except 1.25mg  on Mondays.  1/1: Today's INR is subtherapeutic at 1.5, down from INR of 2.5 on 05/03/20.  Per RN, hematuria is better and urine is not red today.  Hgb has downtrended from 16 to 10 over 1 week, 11.3 this morning/stable. Platelets are stable, normal in the 300s.  Given previous large jump in INR after receiving 2 doses of warfarin 5mg  tablets, will give 2.5mg  today.   Goal of Therapy:   INR goal 2-3  Monitor platelets by anticoagulation protocol: Yes    Plan: Warfarin 2.5mg  PO x 1 today Monitor daily INR, CBC, s/s bleeding Monitor hematuria  05/05/20,  PharmD PGY-1 Acute Care Pharmacy Resident Office: [phone] 05/07/2020 8:59 AM

## 2020-05-07 NOTE — Progress Notes (Signed)
  Speech Language Pathology Treatment: Dysphagia  Patient Details Name: John Lambert MRN: [id number] DOB: 1940/10/27 Today's Date: 05/07/2020 Time: 7124-5809 SLP Time Calculation (min) (ACUTE ONLY): 21 min  Assessment / Plan / Recommendation Clinical Impression  MD requested SLP visit today, pt more alert. Pt found to have thick dry oral mucosa with coated secretions. After oral care and ice chips most of this cleared and pt exhibited effortful swallows with ice with subsequent throat clearing. Pt somewhat reluctant to participate at times. SLP encouraged efforts at phonation, trying to shape throat clear to sustained phonation. Pt frequently shook his head no.   Advised family that John Lambert will likely needs weeks of speech and swallowing therapy even if he were to "pass" an MBS and be allowed a modified diet. He may have minimal intake due to dislike of foods and effort and strategies needed for tolerance. During his rehabilitation, he would benefit from consistent quality nutrition and a PEG would provide that. Recommend today that pt consume ice for the next 12-24 hours to rehydrate oral and pharyngeal tissues. MBS could be considered tomorrow if PEG decision is to be made Monday, though scheduling and availability of this test cannot be guaranteed.    HPI HPI: Pt is an 80 y.o. 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 He also has pain in left upper leg/hip region, and R foot drag; Imaging notable for Numerous scattered punctate acute infarctions in the left  posterior frontal region consistent with micro embolic infarctions  which could either be in the anterior or middle cerebral artery  territory. Punctate acute infarction also affecting the splenium of  the corpus callosum. Pt was seen by SLP on 12/17 for a BSE and SLE with no  subsequent need for SLP services. Left carotid TCAR procedure with stent placement conducted on 12/22. Following the procedure he was found to be aphasic unable to speak with left gaze deviation and right hemiparesis. Emergent CTA showed patent left carotid stent with good flow in the terminal left ICA as well as middle cerebral artery in the M1 segment. There appeared to be decreased flow in one of the M2 branches by neurology. SLP was consulted due to pt failing the [facility] Screen, but a failed swallow screen has not been documented in EMR. EEG 12/22: No seizures. MRI 12/23: New areas of acute infarction are present scattered within the  left basal ganglia and radiating white matter tracts and within the  left frontoparietal cortical and subcortical brain.EEG 12/30: cortical dysfunction in left hemisphere, maximal left fronto-temporal region likely secondary to underlying stroke/structural abnormality. No seizures      SLP Plan  Continue with current plan of care       Recommendations  Diet recommendations: NPO (except for ice chips) Medication Administration: Via alternative means Supervision: Trained caregiver to feed patient                Oral Care Recommendations: Oral care QID Follow up Recommendations: Inpatient Rehab SLP Visit Diagnosis: Dysphagia, oropharyngeal phase (R13.12) Plan: Continue with current plan of care       GO               Harlon Ditty, MA CCC-SLP  Acute Rehabilitation Services Pager [phone] Office [phone]  Claudine Mouton 05/07/2020, 10:48 AM

## 2020-05-07 NOTE — Plan of Care (Signed)
  Problem: Health Behavior/Discharge Planning: Goal: Ability to manage health-related needs will improve Outcome: Progressing   Problem: Clinical Measurements: Goal: Will remain free from infection Outcome: Progressing Goal: Diagnostic test results will improve Outcome: Progressing Goal: Respiratory complications will improve Outcome: Progressing Goal: Cardiovascular complication will be avoided Outcome: Progressing   Problem: Activity: Goal: Risk for activity intolerance will decrease Outcome: Progressing   Problem: Activity: Goal: Risk for activity intolerance will decrease Outcome: Progressing   Problem: Nutrition: Goal: Adequate nutrition will be maintained Outcome: Progressing   Problem: Coping: Goal: Level of anxiety will decrease Outcome: Progressing   Problem: Elimination: Goal: Will not experience complications related to bowel motility Outcome: Progressing Goal: Will not experience complications related to urinary retention Outcome: Progressing   Problem: Pain Managment: Goal: General experience of comfort will improve Outcome: Progressing

## 2020-05-07 NOTE — Progress Notes (Signed)
Per telemetry 18 beats VT at 2252, asymptomatic. BP now 91/54, HR 95. Dr. Antionette Char aware of Bps and now informed of asymptomatic VT.

## 2020-05-07 NOTE — Progress Notes (Signed)
PROGRESS NOTE    John Lambert  [id number] DOB: 02-Jun-1940 DOA: 04/19/2020 PCP: Harlan Stains, MD  Brief Narrative:  The patient is an 80 y.o.malewith PMH significant for hypertension, melanoma, neurofibroma, clotting disorder, history of DVT and PE on chronic Coumadinwhopresented to the ED on 12/14 with c/o : bilateral lower extremity weakness and difficulty ambulating associated with pain in the left hip region.(has a history of neurofibroma tumor removal from his spine about 4 years ago and melanoma removal from right calf about 30 years ago.) MRI brain obtainednoted numerousscattered punctate acute infarctions in the left posterior frontal region consistent with micro embolic infarctions which could either be in the anterior or middle cerebral arteryterritory. Punctate acute infarction also affecting the splenium ofthe corpus callosum.Extensive chronic small-vessel ischemic changes elsewherethroughout the brain.Small old left parietal vertex cortical and subcorticalInfarctions.  MRI lumbar spine showedchronicarachnoiditis with adhesions in the thecal sac and distorted cauda equina nerve roots from L2-L3 to the sacrum, but no evidence ofrecurrent spinal tumor.Possible right L5/S1 radiculitis after noting L4/5 multifactorial mild to moderate spinal stenosis. Vascular surgery was consulted and took patient on 12/22 for a left transcarotid artery stenting. Patient found to have 80% carotid stenosis and then following procedure down to less than 10% with stent. However, following surgery patient noted to have difficulty moving right side expressive aphasia and left gaze deviation and code stroke immediately called. Patient taken straight to CT scan. No new acute infarction noted. Patient underwent emergent left carotid diagnostic cerebral catheter angiogram with left MCA embolism noted from previous, but no acute findings. No intervention planned. Patient sent to medical ICU in  the absence of beds in the neuro ICU.EEG noted cortical dysfunction left hemisphere, but no seizures or epileptiform discharges seen throughout recording.  He was transferred to the stroke service on 04/27/2020 and then transferred out to the [facility] service on 04/29/2020.  Currently he continues to remain weak and his right upper arm is flaccid.  Now having gross hematuria as he has been placed on aspirin Plavix as well as heparin and Coumadin.  Heparin drip has now been stopped given that his Coumadin level therapeutic.  Next Coumadin will be held given his gross hematuria.  Renal function continues to worsen slightly.  Will need to continue monitor continue IV fluid hydration.  Will discuss with nephrology if renal function continues to worsen.  Case was discussed with urology Dr. Alexis Frock who recommends obtaining baseline imaging with a CT of the abdomen pelvis with and without contrast for hematuria.  Unfortunately imaging was not able to be obtained given his worsened renal function so this was done without contrast.  This showed possible pyelonephritis and pancreatitis was essentially ruled out given his normal lipase level.  Foley catheter was inserted yesterday and urine was sent off and grew out Klebsiella and we have placed him on antibiotics and his WBC is improving.  Renal function is improving now with Foley catheter drainage and he is diuresing fairly well.  Patient WBC is trending down as well as his renal function.  We will resume his anticoagulation on 05/05/2020.  Patient underwent CT scan of his chest abdomen pelvis yesterday.  He is little bit more somnolent today still case was discussed with neurology who ordered a repeat MRI which was unchanged and the EEG which still showed encephalopathy which was improved.  We will continue to monitor patient's clinical response closely.  Family today was concerned with some scrotal edema and erythema so his scrotum was elevated  and will obtain  scrotal ultrasound.  Likely this is in the setting of his anasarca and extreme volume overload  05/06/2020 scrotal edema is still there and he does have some bruising on his scrotum and a scrotal ultrasound was done below skin thickening in the right red tubular ectasia and there is also an ill-defined intratesticular heterogeneously hypoechoic area in the left lateral testicle as well as small bilateral hydroceles and bilateral varicoceles.  Urology was consulted and felt the patient had low suspicion of critical significant malignancy.  Recommend outpatient ultrasound in several months to verify stability..  Because of the scrotal ecchymosis and edema and Dr. Tresa Moore he was that this is just a cutaneous bruising and that it would take a few weeks for the color to change and feels that there is no hematoma abdomen on examination.  Urology recommends and trying a trial of void prior to discharge for having outpatient trial void in office.  Patient failed his swallow screen yesterday but he was awake and alert today.  Family wants to wait off on PEG tube placement and want another reevaluation prior to a being placed.  If patient fails again will pursue PEG tube Monday.  His sodium was also elevated induced insensible losses so his free water flushes were increased to 175 mL every 2 hours.  We will bit more awake and alert today  05/07/2020: Patient's renal function is improved but his hepatic function is slightly worsening so we will obtain a right upper quadrant ultrasound.  Patient underwent another swallow test and has failed so we will likely try to proceed with the PEG tube placement on Monday and have consulted interventional radiology.  WBC is slightly worse but he is on day 4 of antibiotics of IV ceftriaxone.  Overall hemoglobin remained stable and he is more awake and alert today than he was yesterday and his sodium is started to come down at 146.  Assessment & Plan:   Principal Problem:    Weakness Active Problems:   Anticoagulated on warfarin   Impaired ambulation   Fall at home, initial encounter   Essential hypertension   Blood clotting disorder (HCC)   Cerebral embolism with cerebral infarction   Stroke [facility])   Middle cerebral artery embolism, left  Acute CVA on presentation from left carotid artery stenosis status post left carotid stenting with complications with  new stroke.   -He underwent elective left carotid transcranial procedure with stenting complicated by postop stroke affecting his speech he is completely aphasic and now with dense right hemiplegia. -He had an emergent CT angiogram and cerebral catheter angiogram which did not reveal large vessel occlusion. -EEG did not show any epileptiform activities. -MRI of the brain showed patchy left basal ganglia as well as left frontoparietal cortical and subcortical infarcts which are new in addition to the previous recent left MCA frontal infarct which was subacute. -Seen by speech therapy modified barium swallow consistent with silent aspiration. -Core track tube was placed which he pulled it out. -Core track tube reinserted 05/02/2020 by dietary.  Tube feeds restarted 05/02/2020. -PT OT recommended CIR but now he is too weak to participate and recommendation has been changed to SNF. -Hemoglobin A1c is 5.3, LDL is 84,  -Echo shows normal ejection fraction with no source of emboli. -Carotid Doppler with left ICA 80 to 99%. -He is currently on heparin and Coumadin till INR is therapeutic.  Front of his stop now that his INR is 2.5 and his Coumadin has been  held given his gross hematuria -Patient is on aspirin Plavix heparin and Coumadin and vascular surgery has recommended triple therapy for 1 month and then going on Coumadin and aspirin alone  -Neurology recommends to continue aspirin and Plavix for 30 days for fresh carotid stent then aspirin alone.  However will need to continue Coumadin for A. fib but this has been  held due to his hematuria for now and likely can be resumed tomorrow after discussion with neurology.  -We will consult palliative for goals of care discussion given his worsening hematuria -He is now having gross hematuria so we will need to watch carefully and Urology recommending CT Abd/Pelvis w/wo Contrast unfortunately was not done last night but because his renal function worsened with done with out contrast today -CT of the abdomen pelvis done and showed no hydronephrosis of either kidney so we will resume his anticoagulation given that his urinary cleared and improved; patient seems to be tolerating his anticoagulation well and has no overt signs of bleeding  Dyspnea with increased labored breathing acute respiratory failure with hypoxia, improving -Chest x-ray done this morning and showed "Masslike density at the RIGHT hilum, suspicious for neoplastic mass or lymphadenopathy, less likely perihilar pneumonia. Recommend chest CT for further characterization. Diffuse bilateral interstitial prominence, presumably some degree of edema/volume overload. Additional patchy opacities within the bilateral perihilar and basilar regions, pneumonia versus pulmonary edema, alternatively pleural plaques related to previous asbestos exposure. Enteric tube passes below the diaphragm"  -Fluid stopped and given IV Lasix 40 mg x 1 a few days ago -CT scan showed no evidence of pneumonia but there was mild scarring/fibrotic change within his lung and there is no noted consolidation, pleural effusion or pneumothorax; CT did not mention any masslike consolidation -Started Xopenex/Atrovent breathing treatments -SpO2: 95 % O2 Flow Rate (L/min): 2 L/min -Continue supplemental oxygen via nasal cannula and wean O2 as follow -Continuous pulse oximetry maintain O2 saturation greater 90% -Also start empiric antibiotics in case this was a pneumonia and he is on IV ceftriaxone and IV azithromycin -Obtain procalcitonin level in  the morning -Repeat chest x-ray the day before yesterday showed that the lungs were clear and the heart size was normal and no pneumothorax or pleural effusions noted  Left carotid artery stenosis vascular surgery status post carotid artery stenting  -Vascular was following and signed off the need aspirin and Plavix for at least a month  History of clotting disorder including PE DVT  -Was on Coumadin prior to admission.   -Doppler of the lower extremity shows chronic DVT.  Continued heparin and Coumadin but both of them and held.  INR was therapeutic but now trended back down; Pharmacy following INR and is now 1.5 and PT -We will resume his Coumadin today given that his urine is fairly clear  Hyperlipidemia  -LDL was 84  -Continue Pravastatin 40 mg daily. -Continue the same dose upon discharge.  Paroxysmal A. fib  -CHA2DS2-VASc score above 5 continue Coumadin on discharge.   -INR slowly trending up to 2.5 today and today it is now 1.6.  Pharmacy adjusting the dose and per my discussion with Dr. Erlinda Hong yesterday we held the Coumadin but will resume today  Essential HTN -Currently on Carvedilol twice a day.  Off of Cleviprex drip.  Dysphagia and Nutrition  -Patient pulled out a Cortrak tube 04/2420 night.   -Patient had MBS done on 04/29/2020 with findings consistent with silent aspiration. -Cortrak tube was placed again on 05/02/2020.  Continue with nutrition  through tube feedings and have increased free water flushes given his hyper per natremia -Continue with nutrition through tube feedings and they were held given his rhonchorous breath sounds -We will need to discuss PEG tube placement if the patient is not able to eat; -Speech therapy states that the patient failed miserably and was worse today than he did yesterday we will need to initiate discussions about PEG tube placement given that the Panda tube is not a permanent fix.  Patient's family is likely going to lean towards PEG  tube placement but want to have a repeat SLP evaluation which I have ordered again.  If patient fails again we will likely pursue PEG tube on Monday and will continue core track tube feeding for now; SLP evaluated and recommending still n.p.o. so we will proceed with a PEG tube and have consulted interventional radiology for assistance with placement -I specifically spoke with the family and discussed with him that having a PEG tube will not prevent the patient from aspirating.  AKI on CKD stage II improving Metabolic Acidosis -His creatinine did trend up and initially they thought was prerenal but could be obstructive and I feel strongly that this is obstruction -He was started on half-normal saline 100 cc an hour for 2 day has now stopped given his rhonchorous breath sounds and because he was 12 L positive -Renal work-up showing that his urinalysis was just pure blood and that his urine had many bacteria, greater than 50 RBCs, 21-50 WBCs with no squamous epithelial cells.  Urine osmolality was 577, urine creatinine was 36.69, urine sodium level 45 -Urine cultures showing Klebsiella Oxytocoa  -Renal Ultrasound showed "Bladder is distended and contains debris. Bladder infection could present this fashion. Prevoid volume 951.9 CC. Mild prominence right renal pelvis noted. 2. Increased echogenicity both kidneys consistent with chronic medical renal disease." -I spoke with with Dr. Berneice Heinrich of Urology who recommended a CT of the abdomen and pelvis with and without contrast for baseline imaging given his hematuria and was concerned that his bladder prevoid volume was 951. -Currently continuing his aspirin and Plavix but holding his Coumadin and heparin drip -I discussed with nephrology Dr. Ronalee Belts who feels that this is all obstruction and post renal and recommended Foley catheter placement which was done. -Patient's BUNs/creatinine is now trending back down and BUN/creatinine is now normalized at  38/0.97 -If not improving with Foley catheter drainage and antibiotic coverage definitely will consult nephrology for further evaluation formally but he is now improving slowly  Acute Urinary Retention -Bladder ultrasound showed greater than 960 today again and renal ultrasound as above  -Foley catheter was placed and he had immediate 1800 mL output -Continue with Foley catheter for now and drainage -May need to  add Tamsulosin -CT scan of the abdomen without pelvis done given his renal function worsening and showed some hydronephrosis of either kidney and no renal or ureteral calculi identified but there was fluid/inflammation of uncertain origin with a differential including pancreatitis and pyelonephritis but could have been also localizing simple ascites -See below; will likely leave Foley catheter in place for a while and urology recommending trial of void the day before or the day of discharge versus outpatient  -Dr. Berneice Heinrich has also started the patient on finasteride and wanted to start the patient on tamsulosin but cannot because it cannot be put through his PEG tube  Hypernatremia -Likely secondary to insensible losses as he has a Panda Tube -Edema level this morning was 150 and will  increase his free water flushes with 175 mL every 4 hours every 2 hours and reevaluate. -Repeat BMP showing that the sodium is starting to trend down is 147 yesterday and is 146 this morning  Normocytic Anemia -Patient's hemoglobin did drop slightly but is now trending back up  -Hemoglobin/hematocrit is stable at 11.3/35.6 -Urine is clearer -We will resume his anticoagulation with Coumadin today and continue to monitor for signs and symptoms of bleeding; currently no more overt bleeding noted -Repeat CBC in a.m.  Klebsiella Urinary Tract Infection -Urine was very dark yesterday and tested and is very turbid and brown-colored but did show many bacteria, more than 50 RBCs per high-power field, no squamous  epithelial cells, and then 21-50 WBCs -Culture showing greater than 100,000 colony-forming units of Klebsiella Oxytocoa -Pete urinalysis done today showed a cloudy appearance with yellow color with large hemoglobin, large leukocytes, negative nitrites, many bacteria, 21-50 RBCs per high-power field, 21-50 WBCs -Empirically started IV ceftriaxone but may need to escalate. -Patient's WBC is trending upward went from 9.1 -> 12.3 -> 15.4 -> 13.2 -> 8.8 -> 10.7 -Blood cultures x2 1 out of 4 bottles with likely contaminant and repeat blood cultures done and showed no growth to date -Could potentially have pyelonephritis given the fluid/inflammation of unknown etiology that is ill-defined and centered around the pancreatic tail and just anterior to the left kidney; I doubt that this is pancreatitis given that his lipase level was normal and this likely could be pyelonephritis and will have neurology weigh in further -Repeat CBC in a.m.  Gross Hematuria, improving after Foley catheter was placed -See above -Spoke with urology who recommends holding off Foley catheter now and obtain a CT of the abdomen pelvis for further baseline imaging and then review -His Coumadin was held yesterday and will continue to hold for now and if improves can likely resume tomorrow night -Discussed with urology Dr. Diona Fanti and he states that he will have Dr. Tresa Moore evaluate -Once urology comes by will have them evaluate the CT scan of the abdomen pelvis and weigh in on the fluid collection is ill-defined -Coumadin was resumed on 05/05/2020 given that his urine is clearer  Hypokalemia -Improved -Patient potassium now 4.7 -Continue monitor if it is necessary -Repeat CMP in a.m.  Scrotal edema and ecchymosis -Elevation and scrotal ultrasound next-likely in setting of his anasarca -Continue with antibiotics -Doppler showed "Bilateral scrotal skin thickening. Right rete tubular ectasia. There is an intra testicular  ill-defined heterogeneously hypoechoic area in the left lateral testicle measuring approximately 1.3 cmwith some internal blood flow. This has nonspecific imaging features. Differential considerations include unusual appearance of rete testis versus intra testicular mass. Presence of internal blood flow favors against testicular hematoma or segmental infarct. Recommend urologic consultation and potentially MRI for more detailed evaluation. There is also a subcentimeter left intra testicular cyst. Small  bilateral hydroceles.  Bilateral varicoceles. Bilateral epididymal head cysts."  Abnormal LFTs Hyperbilirubinemia -Patient is AST is starting to trend up and went from 26 -> 58 -> 100 -Patient's ALT is trending up and went from 35 -> 60 -> 97 -Patient's T bili is also started trending up and went from 0.6 -> 0.8 -> 1.3 -We will obtain a right upper quadrant ultrasound and check an acute hepatitis panel in the a.m. -Continue to monitor and trend hepatic function carefully and repeat CMP in a.m.  DVT prophylaxis: Anticoagulated with Coumadin Code Status: FULL CODE Family Communication: Spoke to daughter and wife at bedside extensively Disposition  Plan: Pending further clinical improvement and clearance by neurology; will need SNF level of care and she is medically stable  Status is: Inpatient  Remains inpatient appropriate because:Unsafe d/c plan, IV treatments appropriate due to intensity of illness or inability to take PO and Inpatient level of care appropriate due to severity of illness   Dispo: The patient is from: Home              Anticipated d/c is to: SNF              Anticipated d/c date is: 3 days              Patient currently is not medically stable to d/c.  Consultants:   Vascular Surgery  Neurology  Palliative Care Medicine  Discussed the case with nephrology Dr. Carolin Sicks  Urology Dr. Dahlstedt/Dr. Tresa Moore   Procedures:  Status post left transcarotid artery stenting  for symptomatic disease complicated by stroke  Antimicrobials:  Anti-infectives (From admission, onward)   Start     Dose/Rate Route Frequency Ordered Stop   05/04/20 0945  azithromycin (ZITHROMAX) 500 mg in sodium chloride 0.9 % 250 mL IVPB  Status:  Discontinued        500 mg 250 mL/hr over 60 Minutes Intravenous Every 24 hours 05/04/20 0859 05/05/20 0956   05/04/20 0900  cefTRIAXone (ROCEPHIN) 1 g in sodium chloride 0.9 % 100 mL IVPB        1 g 200 mL/hr over 30 Minutes Intravenous Every 24 hours 05/04/20 0803     04/27/20 0600  ceFAZolin (ANCEF) IVPB 2g/100 mL premix  Status:  Discontinued       Note to Pharmacy: Send with pt to OR   2 g 200 mL/hr over 30 Minutes Intravenous On call 04/26/20 0805 04/26/20 2018   04/27/20 0600  ceFAZolin (ANCEF) IVPB 2g/100 mL premix        2 g 200 mL/hr over 30 Minutes Intravenous 30 min pre-op 04/26/20 2013 04/27/20 0804   04/27/20 0000  ceFAZolin (ANCEF) IVPB 1 g/50 mL premix  Status:  Discontinued       Note to Pharmacy: Send with pt to OR   1 g 100 mL/hr over 30 Minutes Intravenous On call 04/26/20 0800 04/26/20 0805        Subjective: Seen and examined at bedside and again he is more awake and alert and able to gesture with his hand and asked him how he is doing and he wiggled his hand "so-so."  His renal function is improved but his LFTs are slowly trending up.  SLP evaluated again and may continue to recommend strict n.p.o.  Will consult interventional radiology for PEG tube placement.  All questions were answered to the patient's family satisfaction.  No other concerns or complaints at this time.  Objective: Vitals:   05/07/20 0000 05/07/20 0411 05/07/20 0751 05/07/20 1210  BP: 109/65 111/62 115/67 (!) 103/59  Pulse: 95 (!) 103 96 92  Resp: (!) 24 18 20 18   Temp: 97.7 F (36.5 C) 98.1 F (36.7 C) 98.9 F (37.2 C) 98.8 F (37.1 C)  TempSrc: Oral Oral Axillary Oral  SpO2: 96% 95% 93% 95%  Weight:  86.8 kg    Height:         Intake/Output Summary (Last 24 hours) at 05/07/2020 1532 Last data filed at 05/07/2020 0411 Gross per 24 hour  Intake 2273 ml  Output 1000 ml  Net 1273 ml   Filed Weights   05/05/20 0500  05/06/20 0413 05/07/20 0411  Weight: 85.5 kg 88.3 kg 86.8 kg   Examination: Physical Exam:  Constitutional: The patient is a thin elderly ill-appearing Caucasian male currently in no acute distress today and appears calmer and continues to be more interactive and able to gesture with his hands today. Eyes: Lids and conjunctivae normal, sclerae anicteric  ENMT: External Ears, Nose appear normal. Grossly normal hearing.  Neck: Appears normal, supple, no cervical masses, normal ROM, no appreciable thyromegaly; no JVD Respiratory: Diminished to auscultation bilaterally with coarse breath sounds, no wheezing, rales, rhonchi or crackles.  Is slightly tachypneic and wearing 2 L of supplemental oxygen nasal cannula Cardiovascular: RRR, no murmurs / rubs / gallops. S1 and S2 auscultated.  Trace extremity edema Abdomen: Soft, non-tender, non-distended. Bowel sounds positive.  GU: Deferred.  Foley catheter is in place Musculoskeletal: No clubbing / cyanosis of digits/nails.  Has a right arm upper extremity contracture Skin: No rashes, lesions, ulcers on limited skin evaluation. No induration; Warm and dry.  Neurologic: He does have some hemiplegia and right arm flaccidity and weakness in his right lower extremity.  Unable to talk and is aphasic but is able to gesture with his hands Psychiatric: Normal judgment and insight.  He is awake and alert today.  Has a normal mood and affect.   Data Reviewed: I have personally reviewed following labs and imaging studies  CBC: Recent Labs  Lab 05/04/20 0225 05/04/20 1326 05/05/20 0302 05/06/20 0400 05/07/20 0207  WBC 12.3* 15.4* 13.2* 8.8 10.7*  NEUTROABS 10.6* 13.5* 10.7* 6.3 8.1*  HGB 10.2* 10.8* 10.9* 10.6* 11.3*  HCT 31.2* 34.2* 33.6* 31.9* 35.6*  MCV 92.6  95.0 92.1 91.9 93.2  PLT 256 271 294 299 0000000   Basic Metabolic Panel: Recent Labs  Lab 05/04/20 0225 05/04/20 1326 05/05/20 0302 05/06/20 0400 05/06/20 1523 05/07/20 0207  NA 140 144 145 150* 147* 146*  K 4.2 4.4 3.9 4.1 4.5 4.7  CL 113* 114* 112* 115* 112* 111  CO2 20* 21* 23 26 24 25   GLUCOSE 173* 131* 144* 132* 132* 114*  BUN 53* 55* 55* 48* 40* 38*  CREATININE 1.79* 2.12* 1.81* 1.24 1.11 0.97  CALCIUM 7.5* 8.0* 8.2* 8.4* 8.5* 8.5*  MG 1.8 1.9 1.9 1.8 2.3 2.1  PHOS 2.9 3.8 3.9 4.6  --  4.1   GFR: Estimated Creatinine Clearance: 69.4 mL/min (by C-G formula based on SCr of 0.97 mg/dL). Liver Function Tests: Recent Labs  Lab 05/04/20 0225 05/04/20 1326 05/05/20 0302 05/06/20 0400 05/07/20 0207  AST 23 19 26  58* 100*  ALT 32 30 35 60* 97*  ALKPHOS 30* 29* 31* 32* 37*  BILITOT 1.0 1.1 0.6 0.8 1.3*  PROT 4.8* 5.4* 5.4* 5.2* 5.6*  ALBUMIN 2.3* 2.4* 2.5* 2.3* 2.4*   Recent Labs  Lab 05/04/20 1326  LIPASE 49   No results for input(s): AMMONIA in the last 168 hours. Coagulation Profile: Recent Labs  Lab 05/03/20 0430 05/04/20 0225 05/05/20 0302 05/06/20 0400 05/07/20 0207  INR 2.5* 1.8* 1.6* 1.5* 1.5*   Cardiac Enzymes: Recent Labs  Lab 05/03/20 0430  CKTOTAL 124   BNP (last 3 results) No results for input(s): PROBNP in the last 8760 hours. HbA1C: No results for input(s): HGBA1C in the last 72 hours. CBG: Recent Labs  Lab 05/06/20 1927 05/06/20 2325 05/07/20 0407 05/07/20 0755 05/07/20 1213  GLUCAP 131* 122* 126* 142* 112*   Lipid Profile: No results for input(s): CHOL, HDL, LDLCALC, TRIG, CHOLHDL, LDLDIRECT in the last  72 hours. Thyroid Function Tests: No results for input(s): TSH, T4TOTAL, FREET4, T3FREE, THYROIDAB in the last 72 hours. Anemia Panel: No results for input(s): VITAMINB12, FOLATE, FERRITIN, TIBC, IRON, RETICCTPCT in the last 72 hours. Sepsis Labs: No results for input(s): PROCALCITON, LATICACIDVEN in the last 168  hours.  Recent Results (from the past 240 hour(s))  MRSA PCR Screening     Status: None   Collection Time: 04/27/20  3:39 PM   Specimen: Nasopharyngeal  Result Value Ref Range Status   MRSA by PCR NEGATIVE NEGATIVE Final    Comment:        The GeneXpert MRSA Assay (FDA approved for NASAL specimens only), is one component of a comprehensive MRSA colonization surveillance program. It is not intended to diagnose MRSA infection nor to guide or monitor treatment for MRSA infections. Performed at [facility], [address], [place], [place] [address]   Culture, Urine     Status: Abnormal   Collection Time: 05/03/20 10:42 AM   Specimen: Urine, Random  Result Value Ref Range Status   Specimen Description URINE, RANDOM  Final   Special Requests   Final    NONE Performed at [facility], [address] [place], [place] [address]    Culture >=100,000 COLONIES/mL KLEBSIELLA OXYTOCA (A)  Final   Report Status 05/05/2020 FINAL  Final   Organism ID, Bacteria KLEBSIELLA OXYTOCA (A)  Final      Susceptibility   Klebsiella oxytoca - MIC*    AMPICILLIN RESISTANT Resistant     CEFAZOLIN 8 SENSITIVE Sensitive     CEFEPIME <=0.12 SENSITIVE Sensitive     CEFTRIAXONE <=0.25 SENSITIVE Sensitive     CIPROFLOXACIN <=0.25 SENSITIVE Sensitive     GENTAMICIN <=1 SENSITIVE Sensitive     IMIPENEM <=0.25 SENSITIVE Sensitive     NITROFURANTOIN <=16 SENSITIVE Sensitive     TRIMETH/SULFA <=20 SENSITIVE Sensitive     AMPICILLIN/SULBACTAM 4 SENSITIVE Sensitive     PIP/TAZO <=4 SENSITIVE Sensitive     * >=100,000 COLONIES/mL KLEBSIELLA OXYTOCA  Culture, blood (routine x 2)     Status: Abnormal   Collection Time: 05/04/20  4:46 PM   Specimen: BLOOD  Result Value Ref Range Status   Specimen Description BLOOD BLOOD LEFT FOREARM  Final   Special Requests   Final    BOTTLES DRAWN AEROBIC ONLY Blood Culture adequate volume   Culture  Setup Time   Final    AEROBIC BOTTLE ONLY GRAM POSITIVE  COCCI Organism ID to follow CRITICAL RESULT CALLED TO, READ BACK BY AND VERIFIED WITH: J LEDFORD [facility] 05/06/20 0615 JDW    Culture (A)  Final    STAPHYLOCOCCUS EPIDERMIDIS THE SIGNIFICANCE OF ISOLATING THIS ORGANISM FROM A SINGLE SET OF BLOOD CULTURES WHEN MULTIPLE SETS ARE DRAWN IS UNCERTAIN. PLEASE NOTIFY THE MICROBIOLOGY DEPARTMENT WITHIN ONE WEEK IF SPECIATION AND SENSITIVITIES ARE REQUIRED. Performed at [facility], [address] [place], [place] [address]    Report Status 05/07/2020 FINAL  Final  Blood Culture ID Panel (Reflexed)     Status: Abnormal   Collection Time: 05/04/20  4:46 PM  Result Value Ref Range Status   Enterococcus faecalis NOT DETECTED NOT DETECTED Final   Enterococcus Faecium NOT DETECTED NOT DETECTED Final   Listeria monocytogenes NOT DETECTED NOT DETECTED Final   Staphylococcus species DETECTED (A) NOT DETECTED Final    Comment: CRITICAL RESULT CALLED TO, READ BACK BY AND VERIFIED WITH: J [facility] 05/06/20 0615 JDW  Staphylococcus aureus (BCID) NOT DETECTED NOT DETECTED Final   Staphylococcus epidermidis DETECTED (A) NOT DETECTED Final    Comment: Methicillin (oxacillin) resistant coagulase negative staphylococcus. Possible blood culture contaminant (unless isolated from more than one blood culture draw or clinical case suggests pathogenicity). No antibiotic treatment is indicated for blood  culture contaminants. CRITICAL RESULT CALLED TO, READ BACK BY AND VERIFIED WITH: J LEDFORD [facility] 05/06/20 0615 JDW    Staphylococcus lugdunensis NOT DETECTED NOT DETECTED Final   Streptococcus species NOT DETECTED NOT DETECTED Final   Streptococcus agalactiae NOT DETECTED NOT DETECTED Final   Streptococcus pneumoniae NOT DETECTED NOT DETECTED Final   Streptococcus pyogenes NOT DETECTED NOT DETECTED Final   A.calcoaceticus-baumannii NOT DETECTED NOT DETECTED Final   Bacteroides fragilis NOT DETECTED NOT DETECTED Final   Enterobacterales NOT DETECTED  NOT DETECTED Final   Enterobacter cloacae complex NOT DETECTED NOT DETECTED Final   Escherichia coli NOT DETECTED NOT DETECTED Final   Klebsiella aerogenes NOT DETECTED NOT DETECTED Final   Klebsiella oxytoca NOT DETECTED NOT DETECTED Final   Klebsiella pneumoniae NOT DETECTED NOT DETECTED Final   Proteus species NOT DETECTED NOT DETECTED Final   Salmonella species NOT DETECTED NOT DETECTED Final   Serratia marcescens NOT DETECTED NOT DETECTED Final   Haemophilus influenzae NOT DETECTED NOT DETECTED Final   Neisseria meningitidis NOT DETECTED NOT DETECTED Final   Pseudomonas aeruginosa NOT DETECTED NOT DETECTED Final   Stenotrophomonas maltophilia NOT DETECTED NOT DETECTED Final   Candida albicans NOT DETECTED NOT DETECTED Final   Candida auris NOT DETECTED NOT DETECTED Final   Candida glabrata NOT DETECTED NOT DETECTED Final   Candida krusei NOT DETECTED NOT DETECTED Final   Candida parapsilosis NOT DETECTED NOT DETECTED Final   Candida tropicalis NOT DETECTED NOT DETECTED Final   Cryptococcus neoformans/gattii NOT DETECTED NOT DETECTED Final   Methicillin resistance mecA/C DETECTED (A) NOT DETECTED Final    Comment: CRITICAL RESULT CALLED TO, READ BACK BY AND VERIFIED WITHLenna Sciara [facility] 05/06/20 0615 JDW Performed at [facility], [address] [place], [place] [address]   Culture, blood (routine x 2)     Status: None (Preliminary result)   Collection Time: 05/04/20  4:52 PM   Specimen: BLOOD LEFT HAND  Result Value Ref Range Status   Specimen Description BLOOD LEFT HAND  Final   Special Requests   Final    BOTTLES DRAWN AEROBIC ONLY Blood Culture adequate volume   Culture   Final    NO GROWTH 3 DAYS Performed at [facility], [place] [address], [place], [place] [address]    Report Status PENDING  Incomplete  Culture, blood (routine x 2)     Status: None (Preliminary result)   Collection Time: 05/06/20  9:00 AM   Specimen: BLOOD  Result Value Ref Range Status    Specimen Description BLOOD RIGHT ANTECUBITAL  Final   Special Requests   Final    BOTTLES DRAWN AEROBIC AND ANAEROBIC Blood Culture adequate volume   Culture   Final    NO GROWTH < 24 HOURS Performed at [facility], [place] [address] [place], [place] [address]    Report Status PENDING  Incomplete  Culture, blood (routine x 2)     Status: None (Preliminary result)   Collection Time: 05/06/20  9:03 AM   Specimen: BLOOD RIGHT ARM  Result Value Ref Range Status   Specimen Description BLOOD RIGHT ARM  Final   Special Requests   Final  BOTTLES DRAWN AEROBIC AND ANAEROBIC Blood Culture adequate volume   Culture   Final    NO GROWTH < 24 HOURS Performed at [facility], [place] [address], [place], [place] [address]    Report Status PENDING  Incomplete    RN Pressure Injury Documentation:     Estimated body mass index is 24.57 kg/m as calculated from the following:   Height as of this encounter: 6\' 2"  (1.88 m).   Weight as of this encounter: 86.8 kg.  Malnutrition Type:  Nutrition Problem: Inadequate oral intake Etiology: dysphagia   Malnutrition Characteristics:  Signs/Symptoms: NPO status   Nutrition Interventions:  Interventions: Tube feeding   Radiology Studies: US SCROTUM  Result Date: 05/05/2020 CLINICAL DATA:  80 year old with scrotal swelling. EXAM: ULTRASOUND OF SCROTUM TECHNIQUE: Complete ultrasound examination of the testicles, epididymis, and other scrotal structures was performed. COMPARISON:  None. FINDINGS: Right testicle Measurements: 4.2 x 2.1 x 2.9 cm. Rete tubular ectasia with small cysts at the mediastinum testis. No solid intra testicular mass. Normal blood flow. Left testicle Measurements: 3.3 x 1.7 x 2.5 cm. Testicular echotexture is heterogeneous. Ill-defined area of heterogeneously decreased echogenicity in the lateral testis without well-defined borders measures approximately 1.2 x 0.8 x 1.3 cm. Similar internal vascularity to the adjacent  testicular parenchyma. There is an intratesticular cyst laterally measuring 6 mm. Normal parenchymal blood flow. Right epididymis: Tiny epididymal head cyst. There is also a tiny cyst in the mid epididymal body. No solid lesion or hyperemia. Left epididymis:  Epididymal head cyst measuring 8 mm. Hydrocele:  Small bilaterally, right greater than left. Varicocele:  Present bilaterally. Other: Bilateral scrotal skin thickening. No subcutaneous collection. IMPRESSION: 1. Bilateral scrotal skin thickening. 2. Right rete tubular ectasia. 3. There is an intra testicular ill-defined heterogeneously hypoechoic area in the left lateral testicle measuring approximately 1.3 cm with some internal blood flow. This has nonspecific imaging features. Differential considerations include unusual appearance of rete testis versus intra testicular mass. Presence of internal blood flow favors against testicular hematoma or segmental infarct. Recommend urologic consultation and potentially MRI for more detailed evaluation. There is also a subcentimeter left intra testicular cyst. 4. Small bilateral hydroceles.  Bilateral varicoceles. 5. Bilateral epididymal head cysts. Electronically Signed   By: Keith Rake M.D.   On: 05/05/2020 19:08   Scheduled Meds: . aspirin  81 mg Per Tube Daily  . carvedilol  25 mg Per Tube BID WC  . Chlorhexidine Gluconate Cloth  6 each Topical Daily  . clopidogrel  75 mg Per Tube Daily  . feeding supplement (PROSource TF)  45 mL Per Tube BID  . finasteride  5 mg Oral Daily  . free water  175 mL Per Tube Q2H  . insulin aspart  0-9 Units Subcutaneous Q4H  . pravastatin  40 mg Per Tube Daily  . warfarin  2.5 mg Per Tube ONCE-1600  . Warfarin - Pharmacist Dosing Inpatient   Does not apply q1600   Continuous Infusions: . cefTRIAXone (ROCEPHIN)  IV 1 g (05/07/20 1002)  . feeding supplement (JEVITY 1.5 CAL/FIBER) 1,000 mL (05/06/20 1749)    LOS: 16 days   Kerney Elbe, DO [facility] PAGER is on [place]  If 7PM-7AM, please contact night-coverage [email or web address]

## 2020-05-07 NOTE — Progress Notes (Addendum)
Palliative Medicine Inpatient Follow Up Note   Reason for consult:  Goals of Care  HPI:  Per intake H&P --> The patient is an79 y.o.malewith PMH significant for hypertension, melanoma, neurofibroma, clotting disorder, history of DVT and PE on chronic Coumadinwhopresented to the ED on 12/14 with c/o : bilateral lower extremity weakness and difficulty ambulating associated with pain in the left hip region. MRI brain obtainednoted numerousscattered punctate acute infarctions in the left posterior frontal region consistent with micro embolic infarctions which could either be in the anterior or middle cerebral arteryterritory. Punctate acute infarction also affecting the splenium ofthe corpus callosum.  Palliative care was asked to get involved to address goals of care.  Today's Discussion (05/07/2020): Chart reviewed.  Patients overall clinical state is improving. CT scan completed w/o e/o mass or PNA.   I met with patients wife, Reino Bellis at bedside. We called patients daughter, Durenda Guthrie on speaker phone. She asked me why Palliative care was still involved in her fathers care. I again emphasized that he is acutely ill and we offer additional support in terms of planning for the future, discussing the "what if's", and providing symptom relief. She was unclear on how we can be of help to her father at this point.   Again, introduced Palliative Medicine as specialized medical care for people living with serious illness. It focuses on providing relief from the symptoms and stress of a serious illness. The goal is to improve quality of life for both the patient and the family. Hope does not feel that our ongoing involvement is needed any longer. I provided my information is she has a change of thought and thinks we could be of help to her. I also recommended completing the MOST form as this will be a crucial measure to guide care moving forward.   Orman will likely require a g-tube placement which  patients family are in agreement with.  I was able to verify with the charge RN, Levada Dy that patients pastor from home can visit.   Discussed the importance of continued conversation with family and their  medical providers regarding overall plan of care and treatment options, ensuring decisions are within the context of the patients values and GOCs.  Questions and concerns addressed  __________________________________________________________________________________ Addendum:  Spoke to Hope this late evening. Established our role and involvement. She apologized for being short earlier. We discussed if anything to complete advance planning documents like a MOST form. I recommended she bring his will from home in so that we may scan it into our medical records.   Hope shares that she would appreciate now for [place] to incrementally check in. She states that she has been flustered and didn't understand our role in her fathers case.   Objective Assessment: Vital Signs Vitals:   05/07/20 0751 05/07/20 1210  BP: 115/67 (!) 103/59  Pulse: 96 92  Resp: 20 18  Temp: 98.9 F (37.2 C) 98.8 F (37.1 C)  SpO2: 93% 95%    Intake/Output Summary (Last 24 hours) at 05/07/2020 1420 Last data filed at 05/07/2020 0411 Gross per 24 hour  Intake 2273 ml  Output 1000 ml  Net 1273 ml   Last Weight  Most recent update: 05/07/2020  4:13 AM   Weight  86.8 kg (191 lb 5.8 oz)           Gen:  Elderly M in NAD HEENT: Coretrack in place - dry  mucous membranes CV: Regular rate and rhythm  PULM: On RA, even,  non-labored breathing ABD: soft/nontender  EXT: (+) LE edema  Neuro:Somnolent  SUMMARY OF RECOMMENDATIONS Full Code / Full Scope of care for the time being. Provided "Hard Choices for Loving People" booklet and a MOST form for review. Emphasized looking at the whole clinical picture in terms of advance care planning. Brought up again, the importance of completing a MOST form.   PMT support will be  incremental at this point we will continue to shadow the chart and become re-involved if the family desires  Time Spent: 25 Greater than 50% of the time was spent in counseling and coordination of care ______________________________________________________________________________________ [facility] Team Team Cell Phone: [phone] Please utilize secure chat with additional questions, if there is no response within 30 minutes please call the above phone number  Palliative Medicine Team providers are available by phone from 7am to 7pm daily and can be reached through the team cell phone.  Should this patient require assistance outside of these hours, please call the patient's attending physician.

## 2020-05-08 ENCOUNTER — Inpatient Hospital Stay (HOSPITAL_COMMUNITY): Payer: Medicare Other

## 2020-05-08 DIAGNOSIS — R531 Weakness: Secondary | ICD-10-CM | POA: Diagnosis not present

## 2020-05-08 DIAGNOSIS — D689 Coagulation defect, unspecified: Secondary | ICD-10-CM | POA: Diagnosis not present

## 2020-05-08 DIAGNOSIS — Z515 Encounter for palliative care: Secondary | ICD-10-CM | POA: Diagnosis not present

## 2020-05-08 DIAGNOSIS — Z7901 Long term (current) use of anticoagulants: Secondary | ICD-10-CM | POA: Diagnosis not present

## 2020-05-08 DIAGNOSIS — I10 Essential (primary) hypertension: Secondary | ICD-10-CM | POA: Diagnosis not present

## 2020-05-08 LAB — CBC WITH DIFFERENTIAL/PLATELET
Abs Immature Granulocytes: 0.11 10*3/uL — ABNORMAL HIGH (ref 0.00–0.07)
Basophils Absolute: 0.1 10*3/uL (ref 0.0–0.1)
Basophils Relative: 1 %
Eosinophils Absolute: 0.3 10*3/uL (ref 0.0–0.5)
Eosinophils Relative: 3 %
HCT: 33.8 % — ABNORMAL LOW (ref 39.0–52.0)
Hemoglobin: 11 g/dL — ABNORMAL LOW (ref 13.0–17.0)
Immature Granulocytes: 1 %
Lymphocytes Relative: 13 %
Lymphs Abs: 1.3 10*3/uL (ref 0.7–4.0)
MCH: 30.5 pg (ref 26.0–34.0)
MCHC: 32.5 g/dL (ref 30.0–36.0)
MCV: 93.6 fL (ref 80.0–100.0)
Monocytes Absolute: 0.8 10*3/uL (ref 0.1–1.0)
Monocytes Relative: 8 %
Neutro Abs: 7.4 10*3/uL (ref 1.7–7.7)
Neutrophils Relative %: 74 %
Platelets: 359 10*3/uL (ref 150–400)
RBC: 3.61 MIL/uL — ABNORMAL LOW (ref 4.22–5.81)
RDW: 13.4 % (ref 11.5–15.5)
WBC: 9.9 10*3/uL (ref 4.0–10.5)
nRBC: 0 % (ref 0.0–0.2)

## 2020-05-08 LAB — COMPREHENSIVE METABOLIC PANEL
ALT: 90 U/L — ABNORMAL HIGH (ref 0–44)
AST: 64 U/L — ABNORMAL HIGH (ref 15–41)
Albumin: 2.8 g/dL — ABNORMAL LOW (ref 3.5–5.0)
Alkaline Phosphatase: 37 U/L — ABNORMAL LOW (ref 38–126)
Anion gap: 10 (ref 5–15)
BUN: 36 mg/dL — ABNORMAL HIGH (ref 8–23)
CO2: 26 mmol/L (ref 22–32)
Calcium: 8.5 mg/dL — ABNORMAL LOW (ref 8.9–10.3)
Chloride: 108 mmol/L (ref 98–111)
Creatinine, Ser: 0.99 mg/dL (ref 0.61–1.24)
GFR, Estimated: >60 mL/min (ref >60)
Glucose, Bld: 132 mg/dL — ABNORMAL HIGH (ref 70–99)
Potassium: 4.5 mmol/L (ref 3.5–5.1)
Sodium: 144 mmol/L (ref 135–145)
Total Bilirubin: 1.1 mg/dL (ref 0.3–1.2)
Total Protein: 5.8 g/dL — ABNORMAL LOW (ref 6.5–8.1)

## 2020-05-08 LAB — GLUCOSE, CAPILLARY
Glucose-Capillary: 122 mg/dL — ABNORMAL HIGH (ref 70–99)
Glucose-Capillary: 119 mg/dL — ABNORMAL HIGH (ref 70–99)
Glucose-Capillary: 128 mg/dL — ABNORMAL HIGH (ref 70–99)
Glucose-Capillary: 125 mg/dL — ABNORMAL HIGH (ref 70–99)
Glucose-Capillary: 146 mg/dL — ABNORMAL HIGH (ref 70–99)
Glucose-Capillary: 120 mg/dL — ABNORMAL HIGH (ref 70–99)

## 2020-05-08 LAB — PHOSPHORUS: Phosphorus: 3.6 mg/dL (ref 2.5–4.6)

## 2020-05-08 LAB — HEPARIN LEVEL (UNFRACTIONATED): Heparin Unfractionated: <0.1 IU/mL — ABNORMAL LOW (ref 0.30–0.70)

## 2020-05-08 LAB — PROTIME-INR
INR: 1.4 — ABNORMAL HIGH (ref 0.8–1.2)
Prothrombin Time: 16.9 seconds — ABNORMAL HIGH (ref 11.4–15.2)

## 2020-05-08 LAB — MAGNESIUM: Magnesium: 2 mg/dL (ref 1.7–2.4)

## 2020-05-08 IMAGING — DX DG CHEST 1V PORT
1 series · 1 of 1 positions shown · non-contrast
Comparison: [DATE].

CLINICAL DATA: Cough.

EXAM:
PORTABLE CHEST 1 VIEW

[chest]
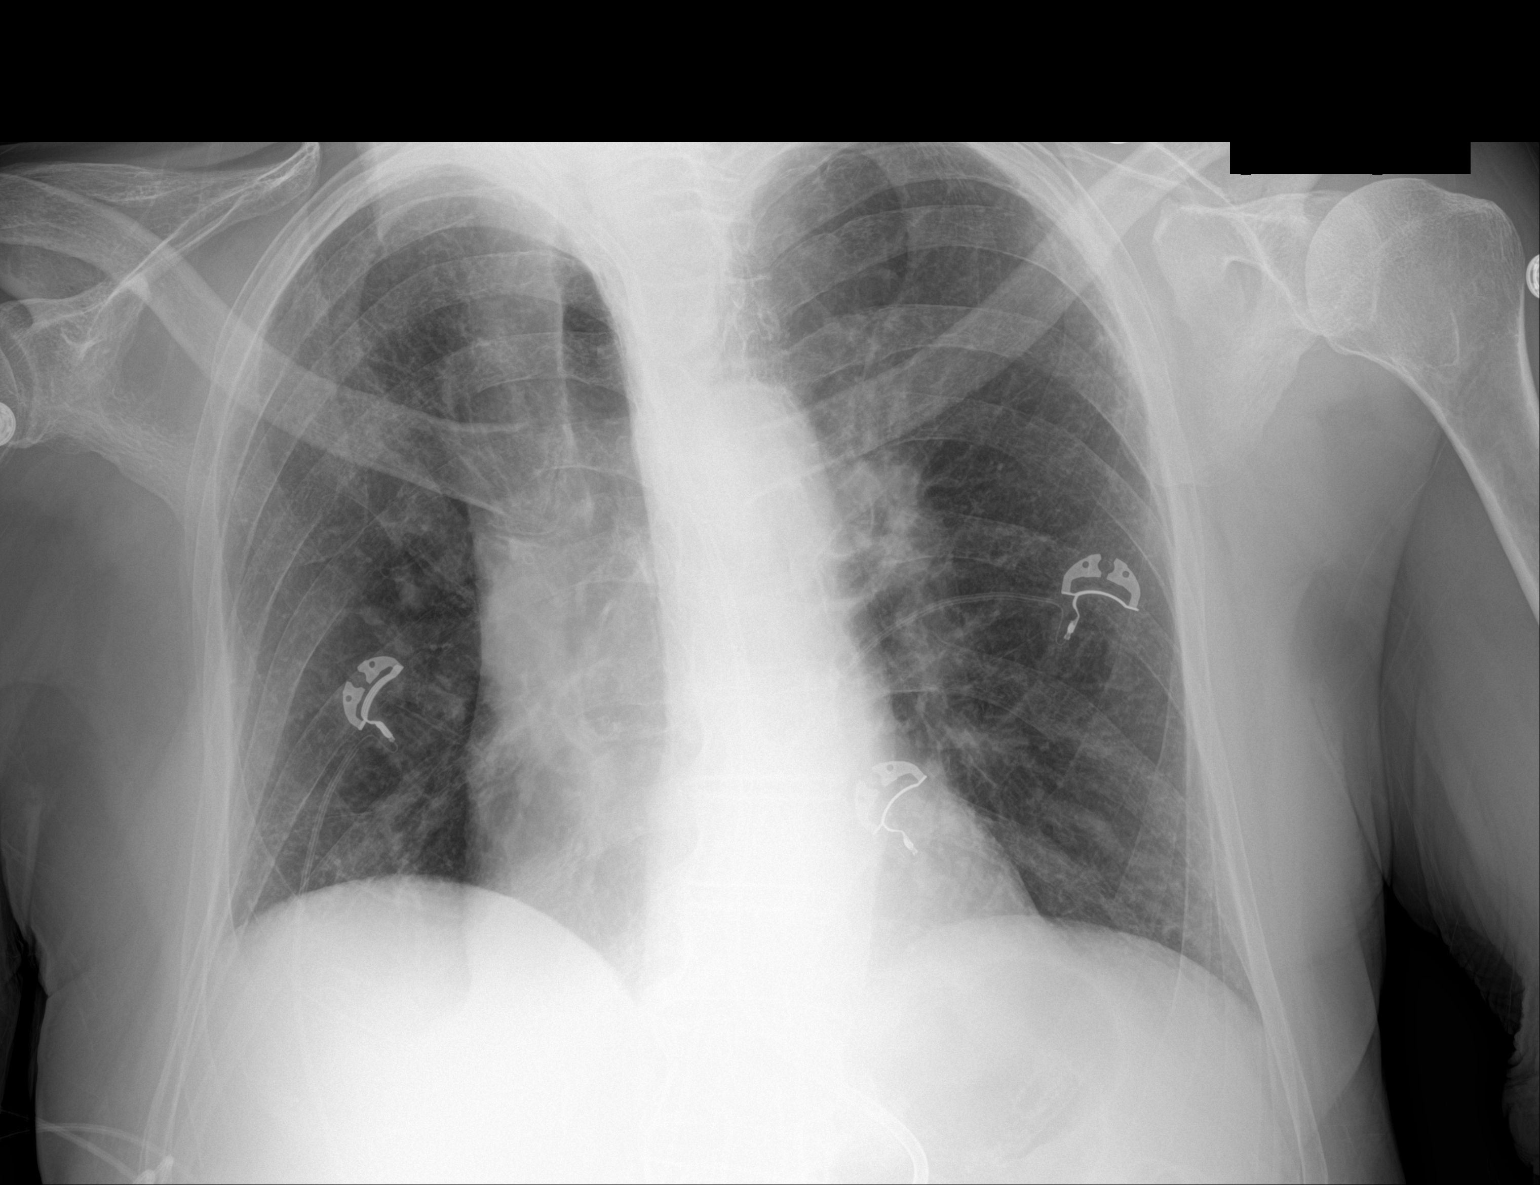

[1 of 1 positions shown; findings below may reference images not displayed]

FINDINGS: The heart size and mediastinal contours are within normal limits.
Both lungs are clear. The visualized skeletal structures are
unremarkable.
IMPRESSION: No active disease.

## 2020-05-08 MED ORDER — HEPARIN (PORCINE) 25000 UT/250ML-% IV SOLN
1300.0000 [IU]/h | INTRAVENOUS | Status: DC
  Administered 2020-05-08: 1000 [IU]/h via INTRAVENOUS
  Administered 2020-05-09: 10:00:00 1250 [IU]/h via INTRAVENOUS
  Administered 2020-05-10: 1550 [IU]/h via INTRAVENOUS
  Administered 2020-05-10: 03:00:00 1400 [IU]/h via INTRAVENOUS
  Administered 2020-05-11: 11:00:00 1650 [IU]/h via INTRAVENOUS
  Administered 2020-05-12: 1600 [IU]/h via INTRAVENOUS
  Filled 2020-05-08 (×8): qty 250

## 2020-05-08 NOTE — Plan of Care (Signed)
  Problem: Health Behavior/Discharge Planning: Goal: Ability to manage health-related needs will improve Outcome: Progressing   Problem: Clinical Measurements: Goal: Will remain free from infection Outcome: Progressing   

## 2020-05-08 NOTE — Progress Notes (Signed)
IR consulted by Dr. Marland Mcalpine for possible image-guided percutaneous gastrostomy tube placement.  Case/images have been reviewed by Dr. Archer Asa who approves anatomy for procedure (does request barium via NGT the day prior, IR to place order for this closer to planned procedure day). Chart check revealed patient is currently on Plavix, Coumadin, Heparin, and Aspirin- per Dr. Archer Asa Coumadin MUST be held 5 days prior to procedure per IR protocol- he states he is ok proceeding with other 3 blood thinners. Plan for image-guided percutaneous gastrostomy tube placement in IR tentatively for Thursday 05/12/2020 pending IR scheduling and appropriate Coumadin hold. Patient will be seen/consent obtained by family early next week prior to procedure. Dr. Marland Mcalpine made aware of above- states he will plan on holding Coumadin starting today (LD 05/07/2020).  Please call IR with questions/concerns.   Waylan Boga Araiyah Cumpton, PA-C 05/08/2020, 9:00 AM

## 2020-05-08 NOTE — Progress Notes (Incomplete)
ANTICOAGULATION CONSULT NOTE  Pharmacy Consult for Warfarin Indication: on PTA warfarin for hx of DVT/PE, new acute stroke, new Afib  Patient Measurements: Height: 6\' 2"  (188 cm) Weight: 83.1 kg (183 lb 3.2 oz) IBW/kg (Calculated) : 82.2   Vital Signs: Temp: 98 F (36.7 C) (01/02 0736) Temp Source: Oral (01/02 0736) BP: 103/61 (01/02 0736) Pulse Rate: 97 (01/02 0736)  Labs: Recent Labs    05/06/20 0400 05/06/20 1523 05/07/20 0207 05/08/20 0350  HGB 10.6*  --  11.3* 11.0*  HCT 31.9*  --  35.6* 33.8*  PLT 299  --  392 359  LABPROT 17.2*  --  17.9* 16.9*  INR 1.5*  --  1.5* 1.4*  CREATININE 1.24 1.11 0.97 0.99    Estimated Creatinine Clearance: 68 mL/min (by C-G formula based on SCr of 0.99 mg/dL).  Assessment: 80 yo male with L symptomatic carotid stenosis.  S/p L carotid TCAR procedure w/ stenting that was complicated by postprocedure stroke.  MRI brain shows patchy L basal ganglia and L frontoparietal cortical and subcortical infarcts (new).  No hemorrhagic transformation. New stroke and new onset atrial fibrillation.  Pharmacy consulted to resume warfarin.  Heparin infusion discontinued on 05/03/20. Warfarin held 12/28 and 12/29 due to hematuria and large jump in INR (1.9 >2.5).  Hematuria appears to be resolving and warfarin was restarted 12/30.  PTA warfarin dose: 2.5mg  daily except 1.25mg  on Mondays.  1/2: Today's INR is subtherapeutic at 1.4. Hgb has downtrended from 16 to 10 over 1 week, 11 this morning/stable. Platelets are stable, normal in the 300s. IR consulted today for PEG tube placement.  Warfarin will be held for 5 days- procedure tentatively planned for Thursday, 1/6.  Goal of Therapy:   INR goal 2-3  Monitor platelets by anticoagulation protocol: Yes    Plan: Hold warfarin for PEG tube placement  Friday, PharmD PGY-1 Acute Care Pharmacy Resident Office: [phone] 05/08/2020 7:45 AM

## 2020-05-08 NOTE — Progress Notes (Addendum)
ANTICOAGULATION CONSULT NOTE  Pharmacy Consult for Warfarin >> heparin Indication: on PTA warfarin for hx of DVT/PE, new acute stroke, new Afib  Patient Measurements: Height: 6\' 2"  (188 cm) Weight: 83.1 kg (183 lb 3.2 oz) IBW/kg (Calculated) : 82.2  Heparin weight: 83.1  Vital Signs: Temp: 97.3 F (36.3 C) (01/02 1147) Temp Source: Oral (01/02 1147) BP: 103/57 (01/02 1147) Pulse Rate: 85 (01/02 1147)  Labs: Recent Labs    05/06/20 0400 05/06/20 1523 05/07/20 0207 05/08/20 0350  HGB 10.6*  --  11.3* 11.0*  HCT 31.9*  --  35.6* 33.8*  PLT 299  --  392 359  LABPROT 17.2*  --  17.9* 16.9*  INR 1.5*  --  1.5* 1.4*  CREATININE 1.24 1.11 0.97 0.99    Estimated Creatinine Clearance: 68 mL/min (by C-G formula based on SCr of 0.99 mg/dL).  Assessment: 80 yo male with L symptomatic carotid stenosis.  S/p L carotid TCAR procedure w/ stenting that was complicated by postprocedure stroke.  MRI brain shows patchy L basal ganglia and L frontoparietal cortical and subcortical infarcts (new).  No hemorrhagic transformation. New stroke and new onset atrial fibrillation. Previous reported hematuria but appears to have resolved.  PTA warfarin dose: 2.5mg  daily except 1.25mg  on Mondays.  1/2: IR consulted for PEG tube placement due to multiple failed swallow study tests.  Procedure planned for Thursday, 1/6 to allow adequate wash-out from warfarin.  Warfarin will be HELD starting today. Last dose of warfarin 05/07/20.  Pharmacy consulted to initiate heparin drip while warfarin held.  Patient previously therapeutic on heparin 1250 units/hr, however patient's weight has decreased.  Will slightly reduce heparin to 1000 units/hr (~12units/kg/hr) and check heparin level in 8 hours.   Goal of Therapy:   Heparin level 0.3-0.5 units/ml (more narrow goal due to recent stroke) INR goal 2-3  Monitor platelets by anticoagulation protocol: Yes    Plan: Initiate heparin drip at 1000 units/hr (~12  units/kg/hr) -- no bolus No warfarin today Check 8 hour HL Monitor daily CBC, s/s bleeding Plan recheck INR on 1/5 to ensure has gone down prior to surgery Monitor hematuria  07/05/20, PharmD PGY-1 Acute Care Pharmacy Resident Office: [phone] 05/08/2020 12:13 PM

## 2020-05-08 NOTE — Progress Notes (Signed)
   Palliative Medicine Inpatient Follow Up Note   Reason for consult:  Goals of Care  HPI:  Per intake H&P --> The patient is an79 y.o.malewith PMH significant for hypertension, melanoma, neurofibroma, clotting disorder, history of DVT and PE on chronic Coumadinwhopresented to the ED on 12/14 with c/o : bilateral lower extremity weakness and difficulty ambulating associated with pain in the left hip region. MRI brain obtainednoted numerousscattered punctate acute infarctions in the left posterior frontal region consistent with micro embolic infarctions which could either be in the anterior or middle cerebral arteryterritory. Punctate acute infarction also affecting the splenium ofthe corpus callosum.  Palliative care was asked to get involved to address goals of care.  Today's Discussion (05/08/2020): Chart reviewed.  Patients overall clinical state is improving. I stopped by this afternoon to check in on Max's progress. Maria's daughter and wife are present at bedside. I asked Hope if she had brought the patients living will in which she shares that she has not. She states that they as a family are getting closer to being ready to have a conversation to complete the MOST form. She expresses that she will call the team phone when ready to complete this.  Called the front desk to enable patients home chaplains to come in for prayer.  Otherwise right now Diogo and his family require no additional support.  Objective Assessment: Vital Signs Vitals:   05/08/20 0736 05/08/20 1147  BP: 103/61 (!) 103/57  Pulse: 97 85  Resp: (!) 22 20  Temp: 98 F (36.7 C) (!) 97.3 F (36.3 C)  SpO2: 94% 96%    Intake/Output Summary (Last 24 hours) at 05/08/2020 1245 Last data filed at 05/08/2020 1105 Gross per 24 hour  Intake 1758 ml  Output 2300 ml  Net -542 ml   Last Weight  Most recent update: 05/08/2020  4:02 AM   Weight  83.1 kg (183 lb 3.2 oz)           Gen:  Elderly M in  NAD HEENT: Coretrack in place - dry  mucous membranes CV: Regular rate and rhythm  PULM: On RA, even, non-labored breathing ABD: soft/nontender  EXT: (+) LE edema  Neuro:Somnolent  SUMMARY OF RECOMMENDATIONS Full Code / Full Scope of care for the time being. Provided "Hard Choices for Loving People" booklet and a MOST form for review. Emphasized looking at the whole clinical picture in terms of advance care planning. Brought up again, the importance of completing a MOST form.   PMT support will be incremental at this point we will continue to shadow the chart and become re-involved if the family desires  Time Spent: 15 Greater than 50% of the time was spent in counseling and coordination of care ______________________________________________________________________________________ Lamarr Lulas [facility] Palliative Medicine Team Team Cell Phone: [phone] Please utilize secure chat with additional questions, if there is no response within 30 minutes please call the above phone number  Palliative Medicine Team providers are available by phone from 7am to 7pm daily and can be reached through the team cell phone.  Should this patient require assistance outside of these hours, please call the patient's attending physician.

## 2020-05-08 NOTE — Progress Notes (Signed)
ANTICOAGULATION CONSULT NOTE  Pharmacy Consult for Warfarin >> heparin Indication: on PTA warfarin for hx of DVT/PE, new acute stroke, new Afib  Patient Measurements: Height: 6\' 2"  (188 cm) Weight: 83.1 kg (183 lb 3.2 oz) IBW/kg (Calculated) : 82.2  Heparin weight: 83.1  Vital Signs: Temp: 97.6 F (36.4 C) (01/02 2041) Temp Source: Oral (01/02 2041) BP: 96/62 (01/02 2041) Pulse Rate: 95 (01/02 2041)  Labs: Recent Labs    05/06/20 0400 05/06/20 1523 05/07/20 0207 05/08/20 0350 05/08/20 2154  HGB 10.6*  --  11.3* 11.0*  --   HCT 31.9*  --  35.6* 33.8*  --   PLT 299  --  392 359  --   LABPROT 17.2*  --  17.9* 16.9*  --   INR 1.5*  --  1.5* 1.4*  --   HEPARINUNFRC  --   --   --   --  <0.10*  CREATININE 1.24 1.11 0.97 0.99  --     Estimated Creatinine Clearance: 68 mL/min (by C-G formula based on SCr of 0.99 mg/dL).  Assessment: 80 yo male with L symptomatic carotid stenosis.  S/p L carotid TCAR procedure w/ stenting that was complicated by postprocedure stroke.  MRI brain shows patchy L basal ganglia and L frontoparietal cortical and subcortical infarcts (new).  No hemorrhagic transformation. New stroke and new onset atrial fibrillation. Previous reported hematuria but appears to have resolved.  PTA warfarin dose: 2.5mg  daily except 1.25mg  on Mondays.  1/2: IR consulted for PEG tube placement due to multiple failed swallow study tests.  Procedure planned for Thursday, 1/6 to allow adequate wash-out from warfarin.  Warfarin will be HELD starting today. Last dose of warfarin 05/07/20.  Pharmacy consulted to initiate heparin drip while warfarin held.  -heparin level < 0.1 on 1000 units/hr  Goal of Therapy:   Heparin level 0.3-0.5 units/ml (more narrow goal due to recent stroke) INR goal 2-3  Monitor platelets by anticoagulation protocol: Yes    Plan: -Increase heparin to 1250 units/hr -Heparin level in 8 hours and daily wth CBC daily  07/05/20, PharmD Clinical  Pharmacist **Pharmacist phone directory can now be found on [email or web address] ([email or web address]).  Listed under [facility].

## 2020-05-08 NOTE — Progress Notes (Signed)
patient has been coughing for the last hour after family feed him ice chips, I have tried to suction him to see if hes actually coughing up anything. the daugher is concerned he has aspirated and needs an xray. lung sounds not changed no fever MD made aware awaiting reply  Chest xray ordered  Kleber Crean, Kae Heller, RN

## 2020-05-08 NOTE — Progress Notes (Signed)
PROGRESS NOTE    John Lambert  [id number] DOB: 1940-07-04 DOA: 04/19/2020 PCP: Harlan Stains, MD  Brief Narrative:  The patient is an 80 y.o.malewith PMH significant for hypertension, melanoma, neurofibroma, clotting disorder, history of DVT and PE on chronic Coumadinwhopresented to the ED on 12/14 with c/o : bilateral lower extremity weakness and difficulty ambulating associated with pain in the left hip region.(has a history of neurofibroma tumor removal from his spine about 4 years ago and melanoma removal from right calf about 30 years ago.) MRI brain obtainednoted numerousscattered punctate acute infarctions in the left posterior frontal region consistent with micro embolic infarctions which could either be in the anterior or middle cerebral arteryterritory. Punctate acute infarction also affecting the splenium ofthe corpus callosum.Extensive chronic small-vessel ischemic changes elsewherethroughout the brain.Small old left parietal vertex cortical and subcorticalInfarctions.  MRI lumbar spine showedchronicarachnoiditis with adhesions in the thecal sac and distorted cauda equina nerve roots from L2-L3 to the sacrum, but no evidence ofrecurrent spinal tumor.Possible right L5/S1 radiculitis after noting L4/5 multifactorial mild to moderate spinal stenosis. Vascular surgery was consulted and took patient on 12/22 for a left transcarotid artery stenting. Patient found to have 80% carotid stenosis and then following procedure down to less than 10% with stent. However, following surgery patient noted to have difficulty moving right side expressive aphasia and left gaze deviation and code stroke immediately called. Patient taken straight to CT scan. No new acute infarction noted. Patient underwent emergent left carotid diagnostic cerebral catheter angiogram with left MCA embolism noted from previous, but no acute findings. No intervention planned. Patient sent to medical ICU in  the absence of beds in the neuro ICU.EEG noted cortical dysfunction left hemisphere, but no seizures or epileptiform discharges seen throughout recording.  He was transferred to the stroke service on 04/27/2020 and then transferred out to the [facility] service on 04/29/2020.  Currently he continues to remain weak and his right upper arm is flaccid.  Now having gross hematuria as he has been placed on aspirin Plavix as well as heparin and Coumadin.  Heparin drip has now been stopped given that his Coumadin level therapeutic.  Next Coumadin will be held given his gross hematuria.  Renal function continues to worsen slightly.  Will need to continue monitor continue IV fluid hydration.  Will discuss with nephrology if renal function continues to worsen.  Case was discussed with urology Dr. Alexis Frock who recommends obtaining baseline imaging with a CT of the abdomen pelvis with and without contrast for hematuria.  Unfortunately imaging was not able to be obtained given his worsened renal function so this was done without contrast.  This showed possible pyelonephritis and pancreatitis was essentially ruled out given his normal lipase level.  Foley catheter was inserted yesterday and urine was sent off and grew out Klebsiella and we have placed him on antibiotics and his WBC is improving.  Renal function is improving now with Foley catheter drainage and he is diuresing fairly well.  Patient WBC is trending down as well as his renal function.  We will resume his anticoagulation on 05/05/2020.  Patient underwent CT scan of his chest abdomen pelvis yesterday.  He is little bit more somnolent today still case was discussed with neurology who ordered a repeat MRI which was unchanged and the EEG which still showed encephalopathy which was improved.  We will continue to monitor patient's clinical response closely.  Family today was concerned with some scrotal edema and erythema so his scrotum was elevated  and will obtain  scrotal ultrasound.  Likely this is in the setting of his anasarca and extreme volume overload  05/06/2020 scrotal edema is still there and he does have some bruising on his scrotum and a scrotal ultrasound was done below skin thickening in the right red tubular ectasia and there is also an ill-defined intratesticular heterogeneously hypoechoic area in the left lateral testicle as well as small bilateral hydroceles and bilateral varicoceles.  Urology was consulted and felt the patient had low suspicion of critical significant malignancy.  Recommend outpatient ultrasound in several months to verify stability..  Because of the scrotal ecchymosis and edema and Dr. Tresa Moore he was that this is just a cutaneous bruising and that it would take a few weeks for the color to change and feels that there is no hematoma abdomen on examination.  Urology recommends and trying a trial of void prior to discharge for having outpatient trial void in office.  Patient failed his swallow screen yesterday but he was awake and alert today.  Family wants to wait off on PEG tube placement and want another reevaluation prior to a being placed.  If patient fails again will pursue PEG tube Monday.  His sodium was also elevated induced insensible losses so his free water flushes were increased to 175 mL every 2 hours.  We will bit more awake and alert today  05/07/2020: Patient's renal function is improved but his hepatic function is slightly worsening so we will obtain a right upper quadrant ultrasound.  Patient underwent another swallow test and has failed so we will likely try to proceed with the PEG tube placement on Monday and have consulted interventional radiology.  WBC is slightly worse but he is on day 4 of antibiotics of IV ceftriaxone.  Overall hemoglobin remained stable and he is more awake and alert today than he was yesterday and his sodium is started to come down at 146.  05/08/2020 as labs are improving and hemoglobin is stable  as well as his renal function.  LFTs are slowly trending down now.  Interventional radiology was consulted for PEG tube placement and this cannot be done until Thursday, 05/13/2019.  They are recommending holding Coumadin until we will switch back to a heparin drip for now for anticoagulation and hold his Coumadin for 5 days as his last dose was 05/07/2020  Assessment & Plan:   Principal Problem:   Weakness Active Problems:   Anticoagulated on warfarin   Impaired ambulation   Fall at home, initial encounter   Essential hypertension   Blood clotting disorder ([place])   Cerebral embolism with cerebral infarction   Stroke [facility])   Middle cerebral artery embolism, left  Acute CVA on presentation from left carotid artery stenosis status post left carotid stenting with complications with  new stroke.   -He underwent elective left carotid transcranial procedure with stenting complicated by postop stroke affecting his speech he is completely aphasic and now with dense right hemiplegia. -He had an emergent CT angiogram and cerebral catheter angiogram which did not reveal large vessel occlusion. -EEG did not show any epileptiform activities. -MRI of the brain showed patchy left basal ganglia as well as left frontoparietal cortical and subcortical infarcts which are new in addition to the previous recent left MCA frontal infarct which was subacute. -Seen by speech therapy modified barium swallow consistent with silent aspiration. -Core track tube was placed which he pulled it out. -Core track tube reinserted 05/02/2020 by dietary.  Tube feeds restarted 05/02/2020. -  PT OT recommended CIR but now he is too weak to participate and recommendation has been changed to SNF. -Hemoglobin A1c is 5.3, LDL is 84,  -Echo shows normal ejection fraction with no source of emboli. -Carotid Doppler with left ICA 80 to 99%. -His INR became therapeutic and then dropped when his Coumadin was held; last INR was 1.4 today and  will hold his Coumadin again and place him on a heparin drip -Patient is on aspirin Plavix heparin and Coumadin and vascular surgery has recommended triple therapy for 1 month and then going on Coumadin and aspirin alone  -Neurology recommends to continue aspirin and Plavix for 30 days for fresh carotid stent then aspirin alone.  His Coumadin was held initially for his hematuria but was resumed and now being held again given that he will be getting a percutaneous gastrostomy tube and will place on a heparin drip in the interim -Palliative care consulted for goals of care discussion and a plan in place in case the patient did not improve and family resistant for further goals of care and hopeful for SNF.  Palliative care will now follow peripherally -He is now having gross hematuria so we will need to watch carefully and Urology recommending CT Abd/Pelvis w/wo Contrast unfortunately was not done last night but because his renal function worsened with done with out contrast today -CT of the abdomen pelvis done and showed no hydronephrosis of either kidney so we will resume his anticoagulation given that his urinary cleared and improved; patient seems to be tolerating his anticoagulation well and has no overt signs of bleeding  Dyspnea with increased labored breathing acute respiratory failure with hypoxia, improving -Chest x-ray done this morning and showed "Masslike density at the RIGHT hilum, suspicious for neoplastic mass or lymphadenopathy, less likely perihilar pneumonia. Recommend chest CT for further characterization. Diffuse bilateral interstitial prominence, presumably some degree of edema/volume overload. Additional patchy opacities within the bilateral perihilar and basilar regions, pneumonia versus pulmonary edema, alternatively pleural plaques related to previous asbestos exposure. Enteric tube passes below the diaphragm"  -Fluid stopped and given IV Lasix 40 mg x 1 a few days ago -CT scan  showed no evidence of pneumonia but there was mild scarring/fibrotic change within his lung and there is no noted consolidation, pleural effusion or pneumothorax; CT did not mention any masslike consolidation -Started Xopenex/Atrovent breathing treatments -SpO2: 96 % O2 Flow Rate (L/min): 2 L/min -Continue supplemental oxygen via nasal cannula and wean O2 as follow -Continuous pulse oximetry maintain O2 saturation greater 90% -Also start empiric antibiotics in case this was a pneumonia and he is on IV ceftriaxone and IV azithromycin -Obtain procalcitonin level in the morning -Repeat chest x-ray that was done on 05/05/2020 showed that the lungs were clear and the heart size was normal and no pneumothorax or pleural effusions noted  Left carotid artery stenosis vascular surgery status post carotid artery stenting  -Vascular was following and signed off the need aspirin and Plavix for at least a month  History of clotting disorder including PE DVT  -Was on Coumadin prior to admission.   -Doppler of the lower extremity shows chronic DVT.  Continued heparin and Coumadin but both of them and held.  INR was therapeutic but now trended back down; Pharmacy following INR and is now 1.4 -Coumadin was resumed 05/05/2020 but now will be held again today 05/08/2020 and will transition him to heparin drip for now given that he will be going for percutaneous gastrostomy tube  Hyperlipidemia  -LDL was 84  -Continue Pravastatin 40 mg daily. -Continue the same dose upon discharge.  Paroxysmal A. fib  -CHA2DS2-VASc score above 5 continue Coumadin on discharge.   -INR became therapeutic at 2.5 but then patient had some hematuria so this was held and his heparin drip was stopped.  Coumadin had been resumed on 05/05/2020. Pharmacy adjusting the dose and Coumadin has been resumed but will be held again on 05/08/2020 as his last dose was 05/07/2020 given that he will be going for a percutaneous gastrostomy tube on  05/13/2019.  In the interim we will place him on a heparin drip given his elevated chads vas score  Essential HTN -Currently on Carvedilol twice a day.  Off of Cleviprex drip.  Dysphagia and Nutrition  -Patient pulled out a Cortrak tube 04/2420 night.   -Patient had MBS done on 04/29/2020 with findings consistent with silent aspiration. -Cortrak tube was placed again on 05/02/2020.  Continue with nutrition through tube feedings and have increased free water flushes given his hyper per natremia -Continue with nutrition through tube feedings and they were held given his rhonchorous breath sounds -We will need to discuss PEG tube placement if the patient is not able to eat; -Speech therapy states that the patient failed miserably and was worse today than he did yesterday we will need to initiate discussions about PEG tube placement given that the Panda tube is not a permanent fix.  Patient's family is likely going to lean towards PEG tube placement but want to have a repeat SLP evaluation which I have ordered again.  If patient fails again we will likely pursue PEG tube on Monday and will continue core track tube feeding for now; SLP evaluated and recommending still n.p.o. so we will proceed with a PEG tube and have consulted interventional radiology for assistance with placement -I specifically spoke with the family and discussed with him that having a PEG tube will not prevent the patient from aspirating and they are in understanding and agreement  AKI on CKD stage II improving Metabolic Acidosis -His creatinine did trend up and initially they thought was prerenal but could be obstructive and I feel strongly that this is obstruction -IV fluids have now stopped -Renal work-up showing that his urinalysis was just pure blood and that his urine had many bacteria, greater than 50 RBCs, 21-50 WBCs with no squamous epithelial cells.  Urine osmolality was 577, urine creatinine was 36.69, urine sodium level  45 -Urine cultures showing Klebsiella Oxytocoa and he is placed on antibiotics -Renal Ultrasound showed "Bladder is distended and contains debris. Bladder infection could present this fashion. Prevoid volume 951.9 CC. Mild prominence right renal pelvis noted. 2. Increased echogenicity both kidneys consistent with chronic medical renal disease." -I spoke with with Dr. Tresa Moore of Urology who recommended a CT of the abdomen and pelvis with and without contrast for baseline imaging given his hematuria and was concerned that his bladder prevoid volume was 951. -I discussed with nephrology Dr. Carolin Sicks who feels that this is all obstruction and post renal and recommended Foley catheter placement which was done. -Patient's BUNs/creatinine is now trending back down and BUN/creatinine is now normalized at 38/0.97 yesterday and today it is 36/2.99 -If not improving with Foley catheter drainage and antibiotic coverage definitely will consult nephrology for further evaluation formally but he is now improving slowly  Acute Urinary Retention -Bladder ultrasound showed greater than 960 today again and renal ultrasound as above  -Foley catheter was placed and  he had immediate 1800 mL output -Continue with Foley catheter for now and drainage -May need to  add Tamsulosin unfortunately cannot be given through the NG tube -CT scan of the abdomen without pelvis done given his renal function worsening and showed some hydronephrosis of either kidney and no renal or ureteral calculi identified but there was fluid/inflammation of uncertain origin with a differential including pancreatitis and pyelonephritis but could have been also localizing simple ascites -See below; will likely leave Foley catheter in place for a while and urology recommending trial of void the day before or the day of discharge versus outpatient; will need to monitor closely -Dr. Tresa Moore has also started the patient on finasteride and wanted to start the  patient on tamsulosin but cannot because it cannot be put through his PEG tube  Hypernatremia, improved -Likely secondary to insensible losses as he has a Panda Tube -Edema level this morning was 150 and will increase his free water flushes with 175 mL every 4 hours every 2 hours and reevaluate.  We will continue 175 mL every every 2 for now because of his insensible losses -Repeat BMP showing that the sodium is starting to trend down and is normalized at 144  Normocytic Anemia -Patient's hemoglobin did drop slightly but is now trending back up  -Hemoglobin/hematocrit is stable at 11.3/35.6 yesterday and today is 11.0/33.8 -Urine is clearer -We will resume his anticoagulation with Coumadin today and continue to monitor for signs and symptoms of bleeding; currently no more overt bleeding noted -Repeat CBC in a.m.  Klebsiella Urinary Tract Infection -Urine was very dark yesterday and tested and is very turbid and brown-colored but did show many bacteria, more than 50 RBCs per high-power field, no squamous epithelial cells, and then 21-50 WBCs -Culture showing greater than 100,000 colony-forming units of Klebsiella Oxytocoa -Pete urinalysis done today showed a cloudy appearance with yellow color with large hemoglobin, large leukocytes, negative nitrites, many bacteria, 21-50 RBCs per high-power field, 21-50 WBCs -Empirically started IV ceftriaxone but may need to escalate. -Patient's WBC is trending upward went from 9.1 -> 12.3 -> 15.4 -> 13.2 -> 8.8 -> 10.7 today it is 9.9 -Blood cultures x2 1 out of 4 bottles with likely contaminant and repeat blood cultures done and showed no growth to date -Could potentially have pyelonephritis given the fluid/inflammation of unknown etiology that is ill-defined and centered around the pancreatic tail and just anterior to the left kidney; I doubt that this is pancreatitis given that his lipase level was normal and this likely could be pyelonephritis and will  have neurology weigh in further -Repeat CBC in a.m.  Gross Hematuria, improving after Foley catheter was placed -See above -Spoke with urology who recommends holding off Foley catheter now and obtain a CT of the abdomen pelvis for further baseline imaging and then review -His Coumadin was held yesterday and will continue to hold for now and if improves can likely resume tomorrow night -Discussed with urology Dr. Diona Fanti and he states that he will have Dr. Tresa Moore evaluate -Once urology comes by will have them evaluate the CT scan of the abdomen pelvis and weigh in on the fluid collection is ill-defined -Coumadin was resumed on 05/05/2020 given that his urine is clearer and will hold for now given that he will need to be off of oral anticoagulation for 5 days for his PEG tube placement.  We will resume a heparin drip and continue to monitor for signs and symptoms of hematuria  Hypokalemia -Improved -  Patient potassium now 4.5 -Continue monitor if it is necessary -Repeat CMP in a.m.  Scrotal edema and ecchymosis -Elevation and scrotal ultrasound next-likely in setting of his anasarca -Continue with antibiotics -Doppler showed "Bilateral scrotal skin thickening. Right rete tubular ectasia. There is an intra testicular ill-defined heterogeneously hypoechoic area in the left lateral testicle measuring approximately 1.3 cmwith some internal blood flow. This has nonspecific imaging features. Differential considerations include unusual appearance of rete testis versus intra testicular mass. Presence of internal blood flow favors against testicular hematoma or segmental infarct. Recommend urologic consultation and potentially MRI for more detailed evaluation. There is also a subcentimeter left intra testicular cyst. Small  bilateral hydroceles.  Bilateral varicoceles. Bilateral epididymal head cysts."  Abnormal LFTs Hyperbilirubinemia -Patient is AST is starting to trend up and went from 26 -> 58 ->  100 and today it is 64 -Patient's ALT is trending up and went from 35 -> 60 -> 97 and today it is 90 -Patient's T bili is also started trending up and went from 0.6 -> 0.8 -> 1.3 and today it is 1.1 -We will obtain a right upper quadrant ultrasound and check an acute hepatitis panel in the a.m. -Continue to monitor and trend hepatic function carefully and repeat CMP in a.m.  DVT prophylaxis: Anticoagulated with Coumadin but will hold this and start a heparin drip in the interim given his elevated CHA2DS2-VASc score as he needs to be off of anticoagulation for PEG tube placement will be taking place on 05/13/2019 Code Status: FULL CODE Family Communication: Spoke to daughter and wife at bedside extensively Disposition Plan: Pending further clinical improvement and clearance by neurology; will need SNF level of care and she is medically stable  Status is: Inpatient  Remains inpatient appropriate because:Unsafe d/c plan, IV treatments appropriate due to intensity of illness or inability to take PO and Inpatient level of care appropriate due to severity of illness   Dispo: The patient is from: Home              Anticipated d/c is to: SNF              Anticipated d/c date is: 3 days              Patient currently is not medically stable to d/c.  Consultants:   Vascular Surgery  Neurology  Palliative Care Medicine  Discussed the case with nephrology Dr. Ronalee Belts  Urology Dr. Dahlstedt/Dr. Berneice Heinrich   Procedures:  Status post left transcarotid artery stenting for symptomatic disease complicated by stroke  Antimicrobials:  Anti-infectives (From admission, onward)   Start     Dose/Rate Route Frequency Ordered Stop   05/04/20 0945  azithromycin (ZITHROMAX) 500 mg in sodium chloride 0.9 % 250 mL IVPB  Status:  Discontinued        500 mg 250 mL/hr over 60 Minutes Intravenous Every 24 hours 05/04/20 0859 05/05/20 0956   05/04/20 0900  cefTRIAXone (ROCEPHIN) 1 g in sodium chloride 0.9 % 100 mL  IVPB        1 g 200 mL/hr over 30 Minutes Intravenous Every 24 hours 05/04/20 0803     04/27/20 0600  ceFAZolin (ANCEF) IVPB 2g/100 mL premix  Status:  Discontinued       Note to Pharmacy: Send with pt to OR   2 g 200 mL/hr over 30 Minutes Intravenous On call 04/26/20 0805 04/26/20 2018   04/27/20 0600  ceFAZolin (ANCEF) IVPB 2g/100 mL premix  2 g 200 mL/hr over 30 Minutes Intravenous 30 min pre-op 04/26/20 2013 04/27/20 0804   04/27/20 0000  ceFAZolin (ANCEF) IVPB 1 g/50 mL premix  Status:  Discontinued       Note to Pharmacy: Send with pt to OR   1 g 100 mL/hr over 30 Minutes Intravenous On call 04/26/20 0800 04/26/20 0805        Subjective: Seen and examined at bedside and he was responsive to verbal stimuli but wanted to rest today.  No acute distress noted and his breathing has calmed down.  Wife and daughter at bedside and had several questions that were answered to their satisfaction.  He remains overall stable and his numbers are improving.  No appreciable complaints or concerns at this time per his wife he actually verbalized something today and has been working with them to try and work and move in the bed..  Objective: Vitals:   05/08/20 0358 05/08/20 0402 05/08/20 0736 05/08/20 1147  BP: 110/77  103/61 (!) 103/57  Pulse: 98  97 85  Resp: 18  (!) 22 20  Temp: 98.2 F (36.8 C)  98 F (36.7 C) (!) 97.3 F (36.3 C)  TempSrc: Oral  Oral Oral  SpO2: 97%  94% 96%  Weight:  83.1 kg    Height:        Intake/Output Summary (Last 24 hours) at 05/08/2020 1224 Last data filed at 05/08/2020 1105 Gross per 24 hour  Intake 1758 ml  Output 2300 ml  Net -542 ml   Filed Weights   05/06/20 0413 05/07/20 0411 05/08/20 0402  Weight: 88.3 kg 86.8 kg 83.1 kg   Examination: Physical Exam:  Constitutional: The patient is an ill-appearing Caucasian male currently no acute distress and arouses to verbal and physical stimuli but wants to rest.  Per wife he verbalized something  today and actually talked little Eyes: Lids and conjunctivae normal, sclerae anicteric  ENMT: External Ears, Nose appear normal. Grossly normal hearing.  Neck: Appears normal, supple, no cervical masses, normal ROM, no appreciable thyromegaly; no JVD Respiratory: Diminished to auscultation bilaterally with coarse breath sounds, no wheezing, rales, rhonchi or crackles. Normal respiratory effort and patient is not tachypenic. No accessory muscle use.  Unlabored breathing wearing supplemental oxygen via nasal cannula Cardiovascular: RRR, no murmurs / rubs / gallops. S1 and S2 auscultated.  No appreciable extremity edema Abdomen: Soft, non-tender, non-distended. Bowel sounds positive.  GU: Deferred.  Foley catheter is in place and has slightly dark amber urine but no appreciable blood Musculoskeletal: No clubbing / cyanosis of digits/nails.  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 No induration; Warm and dry.  Neurologic: He remains somewhat aphasic and does have some hemiplegia and right arm flaccidity and weakness in his right lower extremity.  Does gesture was in his hands but wife states that he actually stated a word today. Psychiatric: Normal judgment and insight.  He is slightly drowsy and somnolent but easily arouses. Normal mood and appropriate affect.   Data Reviewed: I have personally reviewed following labs and imaging studies  CBC: Recent Labs  Lab 05/04/20 1326 05/05/20 0302 05/06/20 0400 05/07/20 0207 05/08/20 0350  WBC 15.4* 13.2* 8.8 10.7* 9.9  NEUTROABS 13.5* 10.7* 6.3 8.1* 7.4  HGB 10.8* 10.9* 10.6* 11.3* 11.0*  HCT 34.2* 33.6* 31.9* 35.6* 33.8*  MCV 95.0 92.1 91.9 93.2 93.6  PLT 271 294 299 392 [id number]   Basic Metabolic Panel: Recent Labs  Lab 05/04/20 1326 05/05/20 0302 05/06/20 0400 05/06/20 1523 05/07/20 0207 05/08/20 0350  NA  144 145 150* 147* 146* 144  K 4.4 3.9 4.1 4.5 4.7 4.5  CL 114* 112* 115* 112* 111 108  CO2 21* 23 26 24 25 26   GLUCOSE 131* 144* 132* 132* 114* 132*  BUN 55* 55* 48* 40* 38* 36*  CREATININE 2.12* 1.81* 1.24 1.11 0.97 0.99  CALCIUM 8.0* 8.2* 8.4* 8.5* 8.5* 8.5*  MG 1.9 1.9 1.8 2.3 2.1 2.0  PHOS 3.8 3.9 4.6  --  4.1 3.6   GFR: Estimated Creatinine Clearance: 68 mL/min (by C-G formula based on SCr of 0.99 mg/dL). Liver Function Tests: Recent Labs  Lab 05/04/20 1326 05/05/20 0302 05/06/20 0400 05/07/20 0207 05/08/20 0350  AST 19 26 58* 100* 64*  ALT 30 35 60* 97* 90*  ALKPHOS 29* 31* 32* 37* 37*  BILITOT 1.1 0.6 0.8 1.3* 1.1  PROT 5.4* 5.4* 5.2* 5.6* 5.8*  ALBUMIN 2.4* 2.5* 2.3* 2.4* 2.8*   Recent Labs  Lab 05/04/20 1326  LIPASE 49   No results for input(s): AMMONIA in the last 168 hours. Coagulation Profile: Recent Labs  Lab 05/04/20 0225 05/05/20 0302 05/06/20 0400 05/07/20 0207 05/08/20 0350  INR 1.8* 1.6* 1.5* 1.5* 1.4*   Cardiac Enzymes: Recent Labs  Lab 05/03/20 0430  CKTOTAL 124   BNP (last 3 results) No results for input(s): PROBNP in the last 8760 hours. HbA1C: No results for input(s): HGBA1C in the last 72 hours. CBG: Recent Labs  Lab 05/07/20 1940 05/07/20 2357 05/08/20 0326 05/08/20 0737 05/08/20 1149  GLUCAP 148* 123* 122* 119* 128*   Lipid Profile: No results for input(s): CHOL, HDL, LDLCALC, TRIG, CHOLHDL, LDLDIRECT in the last 72 hours. Thyroid Function Tests: No results for input(s): TSH, T4TOTAL, FREET4, T3FREE, THYROIDAB in the last 72 hours. Anemia Panel: No results for input(s): VITAMINB12, FOLATE, FERRITIN, TIBC, IRON, RETICCTPCT in the last 72 hours. Sepsis Labs: No results for input(s): PROCALCITON, LATICACIDVEN in the last 168 hours.  Recent Results (from the past 240 hour(s))  Culture, Urine     Status: Abnormal   Collection Time: 05/03/20 10:42 AM   Specimen: Urine, Random  Result Value Ref Range Status   Specimen  Description URINE, RANDOM  Final   Special Requests   Final    NONE Performed at [facility], [address], [place], [place] [address]    Culture >=100,000 COLONIES/mL KLEBSIELLA OXYTOCA (A)  Final   Report Status 05/05/2020 FINAL  Final   Organism ID, Bacteria KLEBSIELLA OXYTOCA (A)  Final      Susceptibility   Klebsiella oxytoca - MIC*    AMPICILLIN RESISTANT Resistant     CEFAZOLIN 8 SENSITIVE Sensitive     CEFEPIME <=0.12 SENSITIVE Sensitive     CEFTRIAXONE <=0.25 SENSITIVE Sensitive     CIPROFLOXACIN <=0.25 SENSITIVE Sensitive     GENTAMICIN <=1 SENSITIVE Sensitive     IMIPENEM <=0.25 SENSITIVE Sensitive     NITROFURANTOIN <=16 SENSITIVE Sensitive     TRIMETH/SULFA <=20 SENSITIVE Sensitive     AMPICILLIN/SULBACTAM 4 SENSITIVE Sensitive     PIP/TAZO <=4 SENSITIVE Sensitive     * >=100,000 COLONIES/mL KLEBSIELLA OXYTOCA  Culture, blood (routine x 2)     Status: Abnormal   Collection Time: 05/04/20  4:46 PM   Specimen: BLOOD  Result Value Ref Range Status   Specimen Description BLOOD BLOOD  LEFT FOREARM  Final   Special Requests   Final    BOTTLES DRAWN AEROBIC ONLY Blood Culture adequate volume   Culture  Setup Time   Final    AEROBIC BOTTLE ONLY GRAM POSITIVE COCCI Organism ID to follow CRITICAL RESULT CALLED TO, READ BACK BY AND VERIFIED WITH: J LEDFORD [facility] 05/06/20 0615 JDW    Culture (A)  Final    STAPHYLOCOCCUS EPIDERMIDIS THE SIGNIFICANCE OF ISOLATING THIS ORGANISM FROM A SINGLE SET OF BLOOD CULTURES WHEN MULTIPLE SETS ARE DRAWN IS UNCERTAIN. PLEASE NOTIFY THE MICROBIOLOGY DEPARTMENT WITHIN ONE WEEK IF SPECIATION AND SENSITIVITIES ARE REQUIRED. Performed at [facility], [place] [address], [place], [place] [address]    Report Status 05/07/2020 FINAL  Final  Blood Culture ID Panel (Reflexed)     Status: Abnormal   Collection Time: 05/04/20  4:46 PM  Result Value Ref Range Status   Enterococcus faecalis NOT DETECTED NOT DETECTED Final   Enterococcus  Faecium NOT DETECTED NOT DETECTED Final   Listeria monocytogenes NOT DETECTED NOT DETECTED Final   Staphylococcus species DETECTED (A) NOT DETECTED Final    Comment: CRITICAL RESULT CALLED TO, READ BACK BY AND VERIFIED WITH: J LEDFORD [facility] 05/06/20 0615 JDW    Staphylococcus aureus (BCID) NOT DETECTED NOT DETECTED Final   Staphylococcus epidermidis DETECTED (A) NOT DETECTED Final    Comment: Methicillin (oxacillin) resistant coagulase negative staphylococcus. Possible blood culture contaminant (unless isolated from more than one blood culture draw or clinical case suggests pathogenicity). No antibiotic treatment is indicated for blood  culture contaminants. CRITICAL RESULT CALLED TO, READ BACK BY AND VERIFIED WITH: J LEDFORD [facility] 05/06/20 0615 JDW    Staphylococcus lugdunensis NOT DETECTED NOT DETECTED Final   Streptococcus species NOT DETECTED NOT DETECTED Final   Streptococcus agalactiae NOT DETECTED NOT DETECTED Final   Streptococcus pneumoniae NOT DETECTED NOT DETECTED Final   Streptococcus pyogenes NOT DETECTED NOT DETECTED Final   A.calcoaceticus-baumannii NOT DETECTED NOT DETECTED Final   Bacteroides fragilis NOT DETECTED NOT DETECTED Final   Enterobacterales NOT DETECTED NOT DETECTED Final   Enterobacter cloacae complex NOT DETECTED NOT DETECTED Final   Escherichia coli NOT DETECTED NOT DETECTED Final   Klebsiella aerogenes NOT DETECTED NOT DETECTED Final   Klebsiella oxytoca NOT DETECTED NOT DETECTED Final   Klebsiella pneumoniae NOT DETECTED NOT DETECTED Final   Proteus species NOT DETECTED NOT DETECTED Final   Salmonella species NOT DETECTED NOT DETECTED Final   Serratia marcescens NOT DETECTED NOT DETECTED Final   Haemophilus influenzae NOT DETECTED NOT DETECTED Final   Neisseria meningitidis NOT DETECTED NOT DETECTED Final   Pseudomonas aeruginosa NOT DETECTED NOT DETECTED Final   Stenotrophomonas maltophilia NOT DETECTED NOT DETECTED Final   Candida albicans NOT  DETECTED NOT DETECTED Final   Candida auris NOT DETECTED NOT DETECTED Final   Candida glabrata NOT DETECTED NOT DETECTED Final   Candida krusei NOT DETECTED NOT DETECTED Final   Candida parapsilosis NOT DETECTED NOT DETECTED Final   Candida tropicalis NOT DETECTED NOT DETECTED Final   Cryptococcus neoformans/gattii NOT DETECTED NOT DETECTED Final   Methicillin resistance mecA/C DETECTED (A) NOT DETECTED Final    Comment: CRITICAL RESULT CALLED TO, READ BACK BY AND VERIFIED WITHLenna Sciara [facility] 05/06/20 0615 JDW Performed at [facility], [address], [place], [place] [address]   Culture, blood (routine x 2)     Status: None (Preliminary result)   Collection Time: 05/04/20  4:52 PM   Specimen: BLOOD LEFT  HAND  Result Value Ref Range Status   Specimen Description BLOOD LEFT HAND  Final   Special Requests   Final    BOTTLES DRAWN AEROBIC ONLY Blood Culture adequate volume   Culture   Final    NO GROWTH 4 DAYS Performed at [facility], [address], [place], [place] [address]    Report Status PENDING  Incomplete  Culture, blood (routine x 2)     Status: None (Preliminary result)   Collection Time: 05/06/20  9:00 AM   Specimen: BLOOD  Result Value Ref Range Status   Specimen Description BLOOD RIGHT ANTECUBITAL  Final   Special Requests   Final    BOTTLES DRAWN AEROBIC AND ANAEROBIC Blood Culture adequate volume   Culture   Final    NO GROWTH 2 DAYS Performed at [facility], [place] [address], [place], [place] [address]    Report Status PENDING  Incomplete  Culture, blood (routine x 2)     Status: None (Preliminary result)   Collection Time: 05/06/20  9:03 AM   Specimen: BLOOD RIGHT ARM  Result Value Ref Range Status   Specimen Description BLOOD RIGHT ARM  Final   Special Requests   Final    BOTTLES DRAWN AEROBIC AND ANAEROBIC Blood Culture adequate volume   Culture   Final    NO GROWTH 2 DAYS Performed at [facility], [place] [address],  [place], [place] [address]    Report Status PENDING  Incomplete    RN Pressure Injury Documentation:     Estimated body mass index is 23.52 kg/m as calculated from the following:   Height as of this encounter: 6\' 2"  (1.88 m).   Weight as of this encounter: 83.1 kg.  Malnutrition Type:  Nutrition Problem: Inadequate oral intake Etiology: dysphagia   Malnutrition Characteristics:  Signs/Symptoms: NPO status   Nutrition Interventions:  Interventions: Tube feeding   Radiology Studies: US Abdomen Limited RUQ (LIVER/GB)  Result Date: 05/07/2020 CLINICAL DATA:  Abnormal liver function tests. EXAM: ULTRASOUND ABDOMEN LIMITED RIGHT UPPER QUADRANT COMPARISON:  May 04, 2020. FINDINGS: Gallbladder: No gallstones or wall thickening visualized. No sonographic Murphy sign noted by sonographer. Common bile duct: Diameter: 3 mm which is within normal limits. Liver: No focal lesion identified. Within normal limits in parenchymal echogenicity. Portal vein is patent on color Doppler imaging with normal direction of blood flow towards the liver. Other: None. IMPRESSION: No definite abnormality seen in the right upper quadrant of the abdomen. Electronically Signed   By: Marijo Conception M.D.   On: 05/07/2020 18:04   Scheduled Meds: . aspirin  81 mg Per Tube Daily  . carvedilol  25 mg Per Tube BID WC  . Chlorhexidine Gluconate Cloth  6 each Topical Daily  . clopidogrel  75 mg Per Tube Daily  . feeding supplement (PROSource TF)  45 mL Per Tube BID  . finasteride  5 mg Oral Daily  . free water  175 mL Per Tube Q2H  . insulin aspart  0-9 Units Subcutaneous Q4H  . pravastatin  40 mg Per Tube Daily   Continuous Infusions: . cefTRIAXone (ROCEPHIN)  IV 1 g (05/08/20 0842)  . feeding supplement (JEVITY 1.5 CAL/FIBER) 1,000 mL (05/07/20 2150)  . heparin      LOS: 17 days   Kerney Elbe, DO [facility] PAGER is on [place]  If 7PM-7AM, please contact night-coverage [email or web address]

## 2020-05-08 NOTE — Plan of Care (Signed)
  Problem: Health Behavior/Discharge Planning: Goal: Ability to manage health-related needs will improve Outcome: Progressing   Problem: Clinical Measurements: Goal: Will remain free from infection Outcome: Progressing Goal: Diagnostic test results will improve Outcome: Progressing Goal: Respiratory complications will improve Outcome: Progressing Goal: Cardiovascular complication will be avoided Outcome: Progressing   Problem: Activity: Goal: Risk for activity intolerance will decrease Outcome: Progressing   Problem: Nutrition: Goal: Adequate nutrition will be maintained Outcome: Progressing   Problem: Coping: Goal: Level of anxiety will decrease Outcome: Progressing   Problem: Elimination: Goal: Will not experience complications related to bowel motility Outcome: Progressing Goal: Will not experience complications related to urinary retention Outcome: Progressing   Problem: Pain Managment: Goal: General experience of comfort will improve Outcome: Progressing   Problem: Safety: Goal: Ability to remain free from injury will improve Outcome: Progressing   Problem: Skin Integrity: Goal: Risk for impaired skin integrity will decrease Outcome: Progressing   Problem: Education: Goal: Knowledge of disease or condition will improve Outcome: Progressing Goal: Knowledge of secondary prevention will improve Outcome: Progressing Goal: Knowledge of patient specific risk factors addressed and post discharge goals established will improve Outcome: Progressing Goal: Individualized Educational Video(s) Outcome: Progressing   Problem: Coping: Goal: Will verbalize positive feelings about self Outcome: Progressing Goal: Will identify appropriate support needs Outcome: Progressing   Problem: Health Behavior/Discharge Planning: Goal: Ability to manage health-related needs will improve Outcome: Progressing   Problem: Self-Care: Goal: Verbalization of feelings and concerns  over difficulty with self-care will improve Outcome: Progressing   Problem: Nutrition: Goal: Risk of aspiration will decrease Outcome: Progressing Goal: Dietary intake will improve Outcome: Progressing   Problem: Ischemic Stroke/TIA Tissue Perfusion: Goal: Complications of ischemic stroke/TIA will be minimized Outcome: Progressing   

## 2020-05-09 ENCOUNTER — Other Ambulatory Visit (HOSPITAL_COMMUNITY): Payer: Medicare Other

## 2020-05-09 ENCOUNTER — Inpatient Hospital Stay (HOSPITAL_COMMUNITY): Payer: Medicare Other

## 2020-05-09 DIAGNOSIS — D689 Coagulation defect, unspecified: Secondary | ICD-10-CM | POA: Diagnosis not present

## 2020-05-09 DIAGNOSIS — Z7901 Long term (current) use of anticoagulants: Secondary | ICD-10-CM | POA: Diagnosis not present

## 2020-05-09 DIAGNOSIS — I10 Essential (primary) hypertension: Secondary | ICD-10-CM | POA: Diagnosis not present

## 2020-05-09 DIAGNOSIS — R531 Weakness: Secondary | ICD-10-CM | POA: Diagnosis not present

## 2020-05-09 LAB — COMPREHENSIVE METABOLIC PANEL
ALT: 87 U/L — ABNORMAL HIGH (ref 0–44)
AST: 56 U/L — ABNORMAL HIGH (ref 15–41)
Albumin: 2.7 g/dL — ABNORMAL LOW (ref 3.5–5.0)
Alkaline Phosphatase: 40 U/L (ref 38–126)
Anion gap: 11 (ref 5–15)
BUN: 32 mg/dL — ABNORMAL HIGH (ref 8–23)
CO2: 23 mmol/L (ref 22–32)
Calcium: 8.3 mg/dL — ABNORMAL LOW (ref 8.9–10.3)
Chloride: 102 mmol/L (ref 98–111)
Creatinine, Ser: 0.77 mg/dL (ref 0.61–1.24)
GFR, Estimated: >60 mL/min (ref >60)
Glucose, Bld: 121 mg/dL — ABNORMAL HIGH (ref 70–99)
Potassium: 4.6 mmol/L (ref 3.5–5.1)
Sodium: 136 mmol/L (ref 135–145)
Total Bilirubin: 0.7 mg/dL (ref 0.3–1.2)
Total Protein: 5.8 g/dL — ABNORMAL LOW (ref 6.5–8.1)

## 2020-05-09 LAB — GLUCOSE, CAPILLARY
Glucose-Capillary: 106 mg/dL — ABNORMAL HIGH (ref 70–99)
Glucose-Capillary: 118 mg/dL — ABNORMAL HIGH (ref 70–99)
Glucose-Capillary: 119 mg/dL — ABNORMAL HIGH (ref 70–99)
Glucose-Capillary: 123 mg/dL — ABNORMAL HIGH (ref 70–99)
Glucose-Capillary: 127 mg/dL — ABNORMAL HIGH (ref 70–99)
Glucose-Capillary: 128 mg/dL — ABNORMAL HIGH (ref 70–99)

## 2020-05-09 LAB — CBC WITH DIFFERENTIAL/PLATELET
Abs Immature Granulocytes: 0.39 10*3/uL — ABNORMAL HIGH (ref 0.00–0.07)
Basophils Absolute: 0.1 10*3/uL (ref 0.0–0.1)
Basophils Relative: 1 %
Eosinophils Absolute: 0.4 10*3/uL (ref 0.0–0.5)
Eosinophils Relative: 3 %
HCT: 35.9 % — ABNORMAL LOW (ref 39.0–52.0)
Hemoglobin: 11.8 g/dL — ABNORMAL LOW (ref 13.0–17.0)
Immature Granulocytes: 3 %
Lymphocytes Relative: 10 %
Lymphs Abs: 1.1 10*3/uL (ref 0.7–4.0)
MCH: 30.4 pg (ref 26.0–34.0)
MCHC: 32.9 g/dL (ref 30.0–36.0)
MCV: 92.5 fL (ref 80.0–100.0)
Monocytes Absolute: 0.8 10*3/uL (ref 0.1–1.0)
Monocytes Relative: 7 %
Neutro Abs: 8.6 10*3/uL — ABNORMAL HIGH (ref 1.7–7.7)
Neutrophils Relative %: 76 %
Platelets: 392 10*3/uL (ref 150–400)
RBC: 3.88 MIL/uL — ABNORMAL LOW (ref 4.22–5.81)
RDW: 13.4 % (ref 11.5–15.5)
WBC: 11.4 10*3/uL — ABNORMAL HIGH (ref 4.0–10.5)
nRBC: 0 % (ref 0.0–0.2)

## 2020-05-09 LAB — HEPARIN LEVEL (UNFRACTIONATED)
Heparin Unfractionated: 0.18 IU/mL — ABNORMAL LOW (ref 0.30–0.70)
Heparin Unfractionated: 0.33 IU/mL (ref 0.30–0.70)

## 2020-05-09 LAB — CULTURE, BLOOD (ROUTINE X 2)
Culture: NO GROWTH
Special Requests: ADEQUATE

## 2020-05-09 LAB — PHOSPHORUS: Phosphorus: 3.3 mg/dL (ref 2.5–4.6)

## 2020-05-09 LAB — MAGNESIUM: Magnesium: 2 mg/dL (ref 1.7–2.4)

## 2020-05-09 IMAGING — DX DG CHEST 1V PORT
1 series · 1 of 1 positions shown · non-contrast
Comparison: Portable exam [Z6] hours compared to [DATE]

CLINICAL DATA: Shortness of breath, cough

EXAM:
PORTABLE CHEST 1 VIEW

[chest]
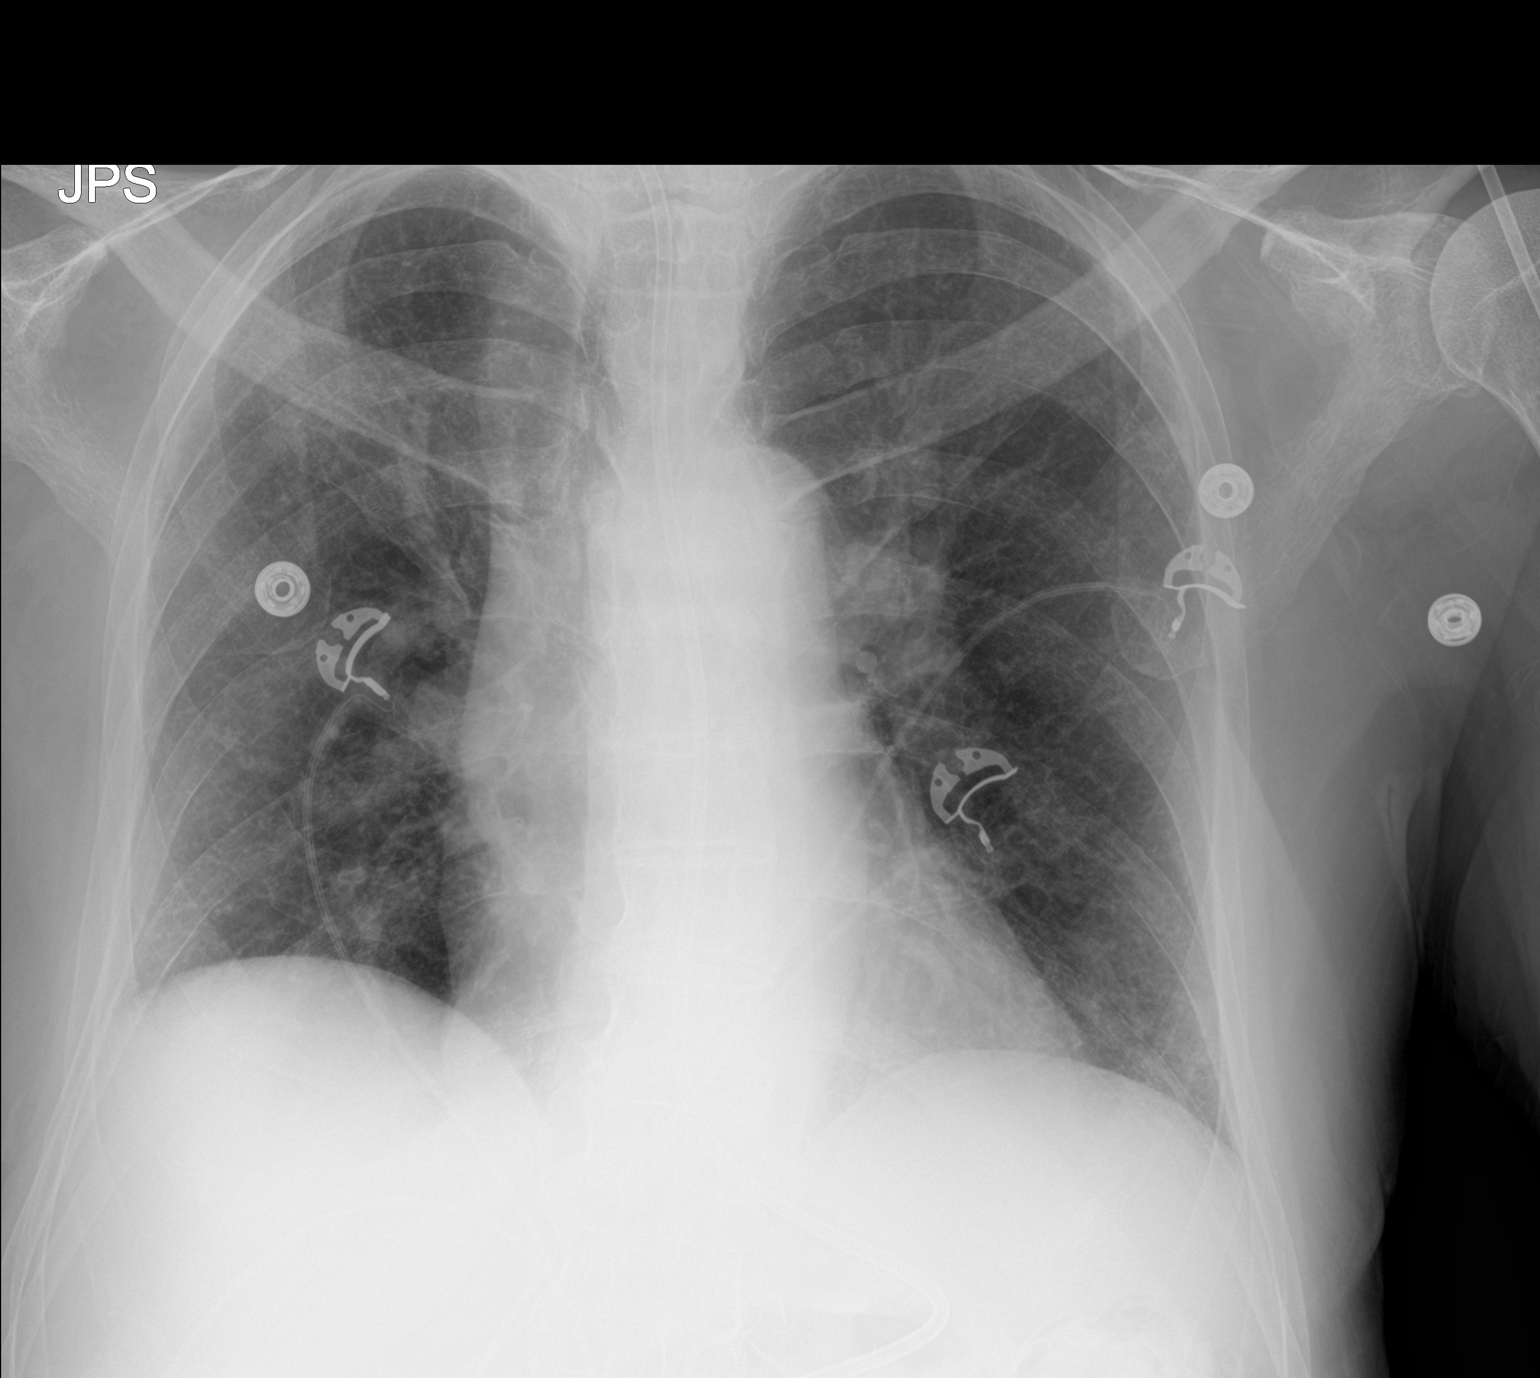

[1 of 1 positions shown; findings below may reference images not displayed]

FINDINGS: Feeding tube extends into stomach.

Normal heart size, mediastinal contours, and pulmonary vascularity.

No definite acute infiltrate, pleural effusion or pneumothorax.

Minimal bibasilar atelectasis noted.

Osseous structures unremarkable.
IMPRESSION: Minimal bibasilar atelectasis.

## 2020-05-09 MED ORDER — GUAIFENESIN-DM 100-10 MG/5ML PO SYRP
5.0000 mL | ORAL_SOLUTION | Freq: Four times a day (QID) | ORAL | Status: DC | PRN
  Administered 2020-05-09 – 2020-05-14 (×11): 5 mL
  Filled 2020-05-09 (×12): qty 5

## 2020-05-09 MED ORDER — HYDROCODONE-HOMATROPINE 5-1.5 MG/5ML PO SYRP: 5.0000 mL | ORAL_SOLUTION | Freq: Four times a day (QID) | ORAL | Status: DC | PRN

## 2020-05-09 MED ORDER — BENZONATATE 100 MG PO CAPS
200.0000 mg | ORAL_CAPSULE | Freq: Three times a day (TID) | ORAL | Status: DC | PRN
  Administered 2020-05-13 – 2020-05-23 (×3): 200 mg via ORAL
  Filled 2020-05-09 (×3): qty 2

## 2020-05-09 MED ORDER — FREE WATER
175.0000 mL | Status: DC
  Administered 2020-05-09 – 2020-05-10 (×6): 175 mL

## 2020-05-09 NOTE — Consult Note (Signed)
Chief Complaint: Patient was seen in consultation today for  Chief Complaint  Patient presents with   Leg Pain   Weakness    Referring Physician(s): Dr. Alfredia Ferguson   Supervising Physician: Dr. Vernard Gambles   Patient Status: [facility] - In-pt  History of Present Illness: John Lambert is a 80 y.o. male with a medical history significant for HTN, melanoma (removed from right calf approximately 30 years ago), neurofibroma, clotting disorder and history of DVT/PE on chronic coumadin. He presented to the ED 04/19/20 with bilateral lower extremity weakness, difficulty ambulating and left hip pain (history of neurofibroma tumor removal from his spine four years ago). MRI brain was positive for multiple infarctions and extensive chronic small-vessel ischemic changes. MRI lumbar spine showedchronicarachnoiditis with adhesions in the thecal sac and distorted cauda equina nerve roots from L2-L3 to the sacrum, but no evidence ofrecurrent spinal tumor.  Vascular surgery was consulted and took patient on 04/27/20 for a left transcarotid artery stenting. Patient found to have 80% carotid stenosis and then following procedure down to less than 10% with stent. Following the procedure, the patient was noted to have difficulty moving his right side, expressive aphasia and left gaze deviation; code stroke immediately called and he was taken to CT scan - no new acute infarction noted. Patient underwent emergent left carotid diagnostic cerebral catheter angiogram in IR with no evidence of intraluminal filling defects, occlusions, stenosis or dissections; no intervention performed.  Currently, the patient remains weak with flaccid right upper arm. He continues to fail swallow tests and is dependent on tube feeds for nutrition.   Interventional Radiology has been asked to evaluate this patient for an image-guided gastrostomy tube placement to facilitate his long-term nutritional needs. This case has been reviewed and  procedure approved by Dr. Laurence Ferrari.   Past Medical History:  Diagnosis Date   Blood clotting disorder ([place])    Blood dyscrasia    Cancer ([place])    melanoma removed from right calf   Hypertension    Neuromuscular disorder ([place])    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    Past Surgical History:  Procedure Laterality Date   IR ANGIO INTRA EXTRACRAN SEL COM CAROTID INNOMINATE UNI L MOD SED  04/27/2020   RADIOLOGY WITH ANESTHESIA N/A 04/27/2020   Procedure: IR WITH ANESTHESIA;  Surgeon: Radiologist, Medication, MD;  Location: [place];  Service: Radiology;  Laterality: N/A;   SKIN BIOPSY     TRANSCAROTID ARTERY REVASCULARIZATION Left 04/27/2020   Procedure: LEFT TRANSCAROTID ARTERY REVASCULARIZATION;  Surgeon: Serafina Mitchell, MD;  Location: [facility] OR;  Service: Vascular;  Laterality: Left;   ULTRASOUND GUIDANCE FOR VASCULAR ACCESS Left 04/27/2020   Procedure: ULTRASOUND GUIDANCE FOR VASCULAR ACCESS;  Surgeon: Serafina Mitchell, MD;  Location: [place];  Service: Vascular;  Laterality: Left;    Allergies: Hctz [hydrochlorothiazide], Hydrocodone, and Vicodin [hydrocodone-acetaminophen]  Medications: Prior to Admission medications   Medication Sig Start Date End Date Taking? Authorizing Provider  acetaminophen (TYLENOL) 500 MG tablet Take 500 mg by mouth every 6 (six) hours as needed for mild pain.   Yes [provider]  carvedilol (COREG) 25 MG tablet Take 25 mg by mouth 2 (two) times daily with a meal.   Yes [provider]  lisinopril (ZESTRIL) 20 MG tablet Take 20 mg by mouth daily.   Yes [provider]  pravastatin (PRAVACHOL) 20 MG tablet Take 20 mg by mouth daily.   Yes [provider]  warfarin (COUMADIN) 2.5 MG tablet Take 1.25-2.5 mg  by mouth See admin instructions. Take 1 tablet (2.5 MG) daily except on Mondays take 1/2 tablet (1.25 MG)   Yes [provider]     History reviewed. No pertinent family history.  Social History    Socioeconomic History   Marital status: Married    Spouse name: Not on file   Number of children: Not on file   Years of education: Not on file   Highest education level: Not on file  Occupational History   Not on file  Tobacco Use   Smoking status: Former Smoker   Smokeless tobacco: Never Used  Vaping Use   Vaping Use: Never used  Substance and Sexual Activity   Alcohol use: Yes    Alcohol/week: 2.0 standard drinks    Types: 2 Glasses of wine per week   Drug use: Never   Sexual activity: Not on file  Other Topics Concern   Not on file  Social History Narrative   Not on file   Social Determinants of Health   Financial Resource Strain: Not on file  Food Insecurity: Not on file  Transportation Needs: Not on file  Physical Activity: Not on file  Stress: Not on file  Social Connections: Not on file    Review of Systems: A 12 point ROS discussed and pertinent positives are indicated in the HPI above.  All other systems are negative.  Review of Systems  Unable to perform ROS: Mental status change    Vital Signs: BP 105/87 (BP Location: Right Arm)    Pulse (!) 107    Temp 98.4 F (36.9 C) (Oral)    Resp 18    Ht 6\' 2"  (1.88 m)    Wt 179 lb 10.8 oz (81.5 kg)    SpO2 95%    BMI 23.07 kg/m   Physical Exam Constitutional:      General: He is not in acute distress.    Comments: Eyes open intermittently during assessment. Appears comfortable.   HENT:     Nose:     Comments: Cortrak in place    Mouth/Throat:     Mouth: Mucous membranes are dry.     Pharynx: Oropharynx is clear.  Cardiovascular:     Rate and Rhythm: Normal rate and regular rhythm.     Pulses: Normal pulses.     Heart sounds: Normal heart sounds.  Pulmonary:     Effort: Pulmonary effort is normal.     Breath sounds: Normal breath sounds.  Abdominal:     General: Bowel sounds are normal.     Palpations: Abdomen is soft.  Skin:    General: Skin is warm and dry.  Neurological:      Mental Status: He is alert.     Comments: Unable to assess. Patient able to nod/shake head intermittently to questions.      Imaging: CT ABDOMEN PELVIS WO CONTRAST  Result Date: 05/04/2020 CLINICAL DATA:  Shortness of breath. Evaluate for pneumonia. Hematuria. EXAM: CT CHEST, ABDOMEN AND PELVIS WITHOUT CONTRAST TECHNIQUE: Multidetector CT imaging of the chest, abdomen and pelvis was performed following the standard protocol without IV contrast. COMPARISON:  None. FINDINGS: CT CHEST FINDINGS Cardiovascular: No pericardial effusion. No thoracic aortic aneurysm. Mild atherosclerosis of the thoracic aorta. Three-vessel coronary artery calcifications. Mediastinum/Nodes: No mass or enlarged lymph nodes are seen within the mediastinum or perihilar regions. Enteric tube passes to the stomach. Trachea is unremarkable. Lungs/Pleura: Patchy mild scarring/fibrosis within each lung. No consolidation, pleural effusion or pneumothorax. Musculoskeletal: No acute  or suspicious osseous finding. Degenerative spondylosis of the kyphotic and slightly scoliotic thoracic spine, mild to moderate in degree. CT ABDOMEN PELVIS FINDINGS Hepatobiliary: No focal liver abnormality is seen. Gallbladder is unremarkable. No bile duct dilatation is seen. Pancreas: Unremarkable. Fluid is seen adjacent to the pancreatic tail, of uncertain origin. Spleen: Normal in size without focal abnormality. Adrenals/Urinary Tract: Adrenal glands are unremarkable. Asymmetrically prominent fluid/inflammation about the LEFT kidney, including thickening of the anterior perinephric fascia. No hydronephrosis of the LEFT kidney. Perhaps mild pelviectasis of the RIGHT kidney without frank hydronephrosis. Bilateral hydroureter, mild to moderate in degree. No renal or ureteral calculi. Bladder is decompressed by Foley catheter. No ureteral calculi seen. Stomach/Bowel: No dilated large or small bowel loops. Appendix is normal. Stomach is decompressed by a  nasogastric tube. Scattered diverticulosis of the sigmoid and descending colon. Vascular/Lymphatic: Mild atherosclerosis of the normal caliber abdominal aorta. Chronic varices within the subcutaneous soft tissues of the lower pelvis. Incidental note of a retroaortic LEFT renal vein. No enlarged lymph nodes are identified. Reproductive: Prostate is unremarkable. Other: Small amount of free fluid in the LEFT upper quadrant, most prominently seen between the pancreatic tail and LEFT kidney. No abscess-like collection is identified. No free intraperitoneal air. Musculoskeletal: Degenerative spondylosis of the slightly scoliotic lumbar spine, moderate in degree. No acute appearing osseous abnormality. Ill-defined fluid/edema within the subcutaneous soft tissues of the abdomen and pelvis indicating anasarca. IMPRESSION: 1. Ill-defined fluid/inflammation centered about the pancreatic tail and just anterior to the LEFT kidney. This fluid/inflammation is of uncertain origin. Differential considerations include acute pancreatitis and pyelonephritis. Alternatively, this may represent localized simple ascites. Recommend correlation with pancreatic lab values and urinalysis. 2. No hydronephrosis of either kidney. No renal or ureteral calculi identified. 3. No evidence of pneumonia. Mild scarring/fibrotic change within each lung. 4. Three-vessel coronary artery calcifications. 5. Anasarca. Aortic Atherosclerosis (ICD10-I70.0). Electronically Signed   By: Franki Cabot M.D.   On: 05/04/2020 11:34   CT Code Stroke CTA Head W/WO contrast  Result Date: 04/27/2020 CLINICAL DATA:  Flaccid right-side EXAM: CT ANGIOGRAPHY HEAD AND NECK TECHNIQUE: Multidetector CT imaging of the head and neck was performed using the standard protocol during bolus administration of intravenous contrast. Multiplanar CT image reconstructions and MIPs were obtained to evaluate the vascular anatomy. Carotid stenosis measurements (when applicable) are  obtained utilizing NASCET criteria, using the distal internal carotid diameter as the denominator. CONTRAST:  62mL OMNIPAQUE IOHEXOL 350 MG/ML SOLN COMPARISON:  Head CT from earlier today. CTA of the head neck from 7 days ago FINDINGS: CTA NECK FINDINGS Aortic arch: Atherosclerosis with 3 vessel branching. Right carotid system: Limited atheromatous changes. No stenosis or ulceration. Left carotid system: Trans carotid revascularization with stent extending from distal common to proximal internal carotid. There is related adjacent soft tissue gas from the surgical access. Wasting of the stent which measures 50% stenosis compared to the more distal stent, per coronal reformats. No ulceration, dissection, or thrombosis seen. Vertebral arteries: No proximal subclavian stenosis. The vertebral arteries are diffusely patent Skeleton: Ordinary degenerative and spondylitic spurring. Other neck: Soft tissue gas related to surgical access. There is medial ization of the left vocal fold. Upper chest: Mild reticulation in the upper lungs with stability favoring scarring. Review of the MIP images confirms the above findings CTA HEAD FINDINGS Anterior circulation: Carotid siphon atheromatous calcification. Aplastic right A1 segment. No major branch occlusion, beading, or aneurysm. Mild atheromatous irregularity to bilateral medium size vessels. Posterior circulation: Mild left vertebral artery dominance. Generalized  atheromatous irregularity of the vertebral and basilar arteries with 40% stenosis of the left V4 segment as measured on coronal reformats. Fetal type right PCA. No branch occlusion, beading, or aneurysm Venous sinuses: Unremarkable in the arterial phase Anatomic variants: As above Review of the MIP images confirms the above findings IMPRESSION: 1. Left trans carotid revascularization. The stent is patent with no visible embolic disease to the major intracranial vessels. There is wasting of the stent at the level of  treated plaque, measuring approximately 50%. 2. Moderate left V4 segment stenosis. 3. Medial ization of the left vocal fold, please correlate for clinical signs of paresis. Electronically Signed   By: Monte Fantasia M.D.   On: 04/27/2020 10:46   CT ANGIO HEAD W OR WO CONTRAST  Result Date: 04/20/2020 CLINICAL DATA:  Stroke/TIA. EXAM: CT ANGIOGRAPHY HEAD AND NECK TECHNIQUE: Multidetector CT imaging of the head and neck was performed using the standard protocol during bolus administration of intravenous contrast. Multiplanar CT image reconstructions and MIPs were obtained to evaluate the vascular anatomy. Carotid stenosis measurements (when applicable) are obtained utilizing NASCET criteria, using the distal internal carotid diameter as the denominator. CONTRAST:  15mL OMNIPAQUE IOHEXOL 350 MG/ML SOLN COMPARISON:  MRI head from the same day. FINDINGS: CTA NECK FINDINGS Aortic arch: Great vessel origins are patent. Right carotid system: No evidence of dissection, stenosis (50% or greater) or occlusion. Left carotid system: Ulcerated atherosclerosis at the carotid bifurcation with resulting severe (approximately 80%) stenosis of the proximal internal carotid artery. Vertebral arteries: Mildly left dominant. No evidence of dissection, stenosis (50% or greater) or occlusion. Skeleton: Multilevel degenerative changes of the spine. Flowing anterior disc osteophytes, compatible with diffuse idiopathic skeletal hyperostosis. No evidence of acute abnormality. Other neck: No mass or suspicious adenopathy. Upper chest: Partially imaged ground-glass opacities in the right upper lung. Review of the MIP images confirms the above findings CTA HEAD FINDINGS Anterior circulation: No significant stenosis, proximal occlusion, aneurysm, or vascular malformation. Absent right A1 ACA with the right A2 ACA rising from the dominant left A1 ACA, anatomic variant. Posterior circulation: Moderate stenosis of the intradural left vertebral  artery. Right fetal type PCA with small right P1 PCA. Venous sinuses: As permitted by contrast timing, patent. Review of the MIP images confirms the above findings IMPRESSION: 1. Ulcerated atherosclerosis at the left carotid bifurcation with resulting severe (approximately 80%) stenosis of the proximal internal carotid artery. 2. Moderate stenosis of the intradural left vertebral artery. 3. Intracranially, no evidence of large vessel occlusion or hemodynamically significant proximal stenosis. 4. Partially imaged ground-glass opacities in the right upper lung, which may be infectious or inflammatory. Recommend dedicated chest imaging to further evaluate. Electronically Signed   By: Margaretha Sheffield MD   On: 04/20/2020 16:34   CT Code Stroke CTA Neck W/WO contrast  Result Date: 04/27/2020 CLINICAL DATA:  Flaccid right-side EXAM: CT ANGIOGRAPHY HEAD AND NECK TECHNIQUE: Multidetector CT imaging of the head and neck was performed using the standard protocol during bolus administration of intravenous contrast. Multiplanar CT image reconstructions and MIPs were obtained to evaluate the vascular anatomy. Carotid stenosis measurements (when applicable) are obtained utilizing NASCET criteria, using the distal internal carotid diameter as the denominator. CONTRAST:  21mL OMNIPAQUE IOHEXOL 350 MG/ML SOLN COMPARISON:  Head CT from earlier today. CTA of the head neck from 7 days ago FINDINGS: CTA NECK FINDINGS Aortic arch: Atherosclerosis with 3 vessel branching. Right carotid system: Limited atheromatous changes. No stenosis or ulceration. Left carotid system: Trans carotid revascularization  with stent extending from distal common to proximal internal carotid. There is related adjacent soft tissue gas from the surgical access. Wasting of the stent which measures 50% stenosis compared to the more distal stent, per coronal reformats. No ulceration, dissection, or thrombosis seen. Vertebral arteries: No proximal subclavian  stenosis. The vertebral arteries are diffusely patent Skeleton: Ordinary degenerative and spondylitic spurring. Other neck: Soft tissue gas related to surgical access. There is medial ization of the left vocal fold. Upper chest: Mild reticulation in the upper lungs with stability favoring scarring. Review of the MIP images confirms the above findings CTA HEAD FINDINGS Anterior circulation: Carotid siphon atheromatous calcification. Aplastic right A1 segment. No major branch occlusion, beading, or aneurysm. Mild atheromatous irregularity to bilateral medium size vessels. Posterior circulation: Mild left vertebral artery dominance. Generalized atheromatous irregularity of the vertebral and basilar arteries with 40% stenosis of the left V4 segment as measured on coronal reformats. Fetal type right PCA. No branch occlusion, beading, or aneurysm Venous sinuses: Unremarkable in the arterial phase Anatomic variants: As above Review of the MIP images confirms the above findings IMPRESSION: 1. Left trans carotid revascularization. The stent is patent with no visible embolic disease to the major intracranial vessels. There is wasting of the stent at the level of treated plaque, measuring approximately 50%. 2. Moderate left V4 segment stenosis. 3. Medial ization of the left vocal fold, please correlate for clinical signs of paresis. Electronically Signed   By: Monte Fantasia M.D.   On: 04/27/2020 10:46   CT ANGIO NECK W OR WO CONTRAST  Result Date: 04/20/2020 CLINICAL DATA:  Stroke/TIA. EXAM: CT ANGIOGRAPHY HEAD AND NECK TECHNIQUE: Multidetector CT imaging of the head and neck was performed using the standard protocol during bolus administration of intravenous contrast. Multiplanar CT image reconstructions and MIPs were obtained to evaluate the vascular anatomy. Carotid stenosis measurements (when applicable) are obtained utilizing NASCET criteria, using the distal internal carotid diameter as the denominator. CONTRAST:   53mL OMNIPAQUE IOHEXOL 350 MG/ML SOLN COMPARISON:  MRI head from the same day. FINDINGS: CTA NECK FINDINGS Aortic arch: Great vessel origins are patent. Right carotid system: No evidence of dissection, stenosis (50% or greater) or occlusion. Left carotid system: Ulcerated atherosclerosis at the carotid bifurcation with resulting severe (approximately 80%) stenosis of the proximal internal carotid artery. Vertebral arteries: Mildly left dominant. No evidence of dissection, stenosis (50% or greater) or occlusion. Skeleton: Multilevel degenerative changes of the spine. Flowing anterior disc osteophytes, compatible with diffuse idiopathic skeletal hyperostosis. No evidence of acute abnormality. Other neck: No mass or suspicious adenopathy. Upper chest: Partially imaged ground-glass opacities in the right upper lung. Review of the MIP images confirms the above findings CTA HEAD FINDINGS Anterior circulation: No significant stenosis, proximal occlusion, aneurysm, or vascular malformation. Absent right A1 ACA with the right A2 ACA rising from the dominant left A1 ACA, anatomic variant. Posterior circulation: Moderate stenosis of the intradural left vertebral artery. Right fetal type PCA with small right P1 PCA. Venous sinuses: As permitted by contrast timing, patent. Review of the MIP images confirms the above findings IMPRESSION: 1. Ulcerated atherosclerosis at the left carotid bifurcation with resulting severe (approximately 80%) stenosis of the proximal internal carotid artery. 2. Moderate stenosis of the intradural left vertebral artery. 3. Intracranially, no evidence of large vessel occlusion or hemodynamically significant proximal stenosis. 4. Partially imaged ground-glass opacities in the right upper lung, which may be infectious or inflammatory. Recommend dedicated chest imaging to further evaluate. Electronically Signed   By:  Margaretha Sheffield MD   On: 04/20/2020 16:34   CT CHEST WO CONTRAST  Result Date:  05/04/2020 CLINICAL DATA:  Shortness of breath. Evaluate for pneumonia. Hematuria. EXAM: CT CHEST, ABDOMEN AND PELVIS WITHOUT CONTRAST TECHNIQUE: Multidetector CT imaging of the chest, abdomen and pelvis was performed following the standard protocol without IV contrast. COMPARISON:  None. FINDINGS: CT CHEST FINDINGS Cardiovascular: No pericardial effusion. No thoracic aortic aneurysm. Mild atherosclerosis of the thoracic aorta. Three-vessel coronary artery calcifications. Mediastinum/Nodes: No mass or enlarged lymph nodes are seen within the mediastinum or perihilar regions. Enteric tube passes to the stomach. Trachea is unremarkable. Lungs/Pleura: Patchy mild scarring/fibrosis within each lung. No consolidation, pleural effusion or pneumothorax. Musculoskeletal: No acute or suspicious osseous finding. Degenerative spondylosis of the kyphotic and slightly scoliotic thoracic spine, mild to moderate in degree. CT ABDOMEN PELVIS FINDINGS Hepatobiliary: No focal liver abnormality is seen. Gallbladder is unremarkable. No bile duct dilatation is seen. Pancreas: Unremarkable. Fluid is seen adjacent to the pancreatic tail, of uncertain origin. Spleen: Normal in size without focal abnormality. Adrenals/Urinary Tract: Adrenal glands are unremarkable. Asymmetrically prominent fluid/inflammation about the LEFT kidney, including thickening of the anterior perinephric fascia. No hydronephrosis of the LEFT kidney. Perhaps mild pelviectasis of the RIGHT kidney without frank hydronephrosis. Bilateral hydroureter, mild to moderate in degree. No renal or ureteral calculi. Bladder is decompressed by Foley catheter. No ureteral calculi seen. Stomach/Bowel: No dilated large or small bowel loops. Appendix is normal. Stomach is decompressed by a nasogastric tube. Scattered diverticulosis of the sigmoid and descending colon. Vascular/Lymphatic: Mild atherosclerosis of the normal caliber abdominal aorta. Chronic varices within the  subcutaneous soft tissues of the lower pelvis. Incidental note of a retroaortic LEFT renal vein. No enlarged lymph nodes are identified. Reproductive: Prostate is unremarkable. Other: Small amount of free fluid in the LEFT upper quadrant, most prominently seen between the pancreatic tail and LEFT kidney. No abscess-like collection is identified. No free intraperitoneal air. Musculoskeletal: Degenerative spondylosis of the slightly scoliotic lumbar spine, moderate in degree. No acute appearing osseous abnormality. Ill-defined fluid/edema within the subcutaneous soft tissues of the abdomen and pelvis indicating anasarca. IMPRESSION: 1. Ill-defined fluid/inflammation centered about the pancreatic tail and just anterior to the LEFT kidney. This fluid/inflammation is of uncertain origin. Differential considerations include acute pancreatitis and pyelonephritis. Alternatively, this may represent localized simple ascites. Recommend correlation with pancreatic lab values and urinalysis. 2. No hydronephrosis of either kidney. No renal or ureteral calculi identified. 3. No evidence of pneumonia. Mild scarring/fibrotic change within each lung. 4. Three-vessel coronary artery calcifications. 5. Anasarca. Aortic Atherosclerosis (ICD10-I70.0). Electronically Signed   By: Franki Cabot M.D.   On: 05/04/2020 11:34   MR BRAIN WO CONTRAST  Result Date: 05/05/2020 CLINICAL DATA:  Stroke, follow up EXAM: MRI HEAD WITHOUT CONTRAST TECHNIQUE: Multiplanar, multiecho pulse sequences of the brain and surrounding structures were obtained without intravenous contrast. COMPARISON:  04/28/2020 and prior. FINDINGS: Please note that limited sequence acquisition limits evaluation. Brain: Mild cerebral atrophy with ex vacuo dilatation. No midline shift, ventriculomegaly or extra-axial fluid collection. No intracranial hemorrhage. Redemonstration of multifocal subacute infarcts involving the left cerebrum, unchanged. Tiny DWI hyperintensity  involving the right caudate head on prior exam is not demonstrated. No new acute/subacute infarct. Multifocal remote insults are better demonstrated on prior exam. Vascular: Not well evaluated. Skull and upper cervical spine: No focal abnormality. Sinuses/Orbits: Not well evaluated. Other: None. IMPRESSION: Multifocal subacute left cerebral infarcts, unchanged. No new infarct or intracranial hemorrhage. Prior punctate right caudate head  DWI hyperintensity is not demonstrated. Electronically Signed   By: Primitivo Gauze M.D.   On: 05/05/2020 15:25   MR BRAIN WO CONTRAST  Result Date: 04/28/2020 CLINICAL DATA:  Left carotid stent placed for stenosis. EXAM: MRI HEAD WITHOUT CONTRAST TECHNIQUE: Multiplanar, multiecho pulse sequences of the brain and surrounding structures were obtained without intravenous contrast. COMPARISON:  04/27/2020.  MRI 04/20/2020. FINDINGS: Brain: No acute finding affects the brainstem or cerebellum. Few old small vessel cerebellar infarctions on the left. Old small vessel infarctions of the pons. Right cerebral hemisphere shows atrophy with chronic small-vessel ischemic changes of the white matter. Old small right frontal vertex cortical and subcortical infarction. Left hemisphere shows areas of subacute infarction at the medial left frontoparietal vertex. New areas of acute infarction are present scattered within the basal ganglia and radiating white matter tracts and within the left frontoparietal cortical and subcortical brain. No large confluent infarction. No swelling or hemorrhage. Chronic small-vessel ischemic changes seen elsewhere. No hydrocephalus or extra-axial collection. Vascular: Major vessels at the base of the brain show flow. Skull and upper cervical spine: Negative Sinuses/Orbits: Clear/normal Other: None IMPRESSION: 1. New areas of acute infarction are present scattered within the left basal ganglia and radiating white matter tracts and within the left  frontoparietal cortical and subcortical brain. No large confluent infarction. No swelling or hemorrhage. 2. Subacute infarction in the left medial frontoparietal vertex undergoing expected evolutionary changes. 3. Chronic small-vessel ischemic changes elsewhere throughout the brain as outlined above. Electronically Signed   By: Nelson Chimes M.D.   On: 04/28/2020 12:24   MR BRAIN WO CONTRAST  Result Date: 04/20/2020 CLINICAL DATA:  Acute presentation leg weakness difficulty walking EXAM: MRI HEAD WITHOUT CONTRAST TECHNIQUE: Multiplanar, multiecho pulse sequences of the brain and surrounding structures were obtained without intravenous contrast. COMPARISON:  None. FINDINGS: Brain: There are numerous scattered punctate acute infarctions affecting the left posterior frontal region, none larger than 1 cm, consistent with a cluster of micro embolic infarctions which could either be in the anterior or middle cerebral artery territory. Punctate acute infarction also affecting the splenium of the corpus callosum. No acute infarction in the right hemisphere, brainstem or cerebellum. Old small vessel infarctions affect the cerebellum and the pons. Chronic small-vessel ischemic changes present throughout the cerebral hemispheric white matter. Old cortical and subcortical infarctions at the left parietal vertex. 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 No hydrocephalus, mass or extra-axial collection. No swelling or acute hemorrhage. Vascular: Major vessels at the base of the brain show flow. Skull and upper cervical spine: Negative Sinuses/Orbits: Clear/normal Other: None IMPRESSION: 1. Numerous scattered punctate acute infarctions in the left posterior frontal region consistent with micro embolic infarctions which could either be in the anterior or middle cerebral artery territory. Punctate acute infarction also affecting the splenium of the corpus callosum. 2. Extensive chronic small-vessel ischemic changes elsewhere  throughout the brain as outlined above. 3. Small old left parietal vertex cortical and subcortical infarctions. Electronically Signed   By: Nelson Chimes M.D.   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 Acute foot drag. Clotting disorder. Weakness in legs, difficulty walking. EXAM: MRI LUMBAR SPINE WITHOUT CONTRAST TECHNIQUE: Multiplanar, multisequence MR imaging of the lumbar spine was performed. No intravenous contrast was administered. COMPARISON:  Hip CT yesterday. FINDINGS: Segmentation: Lumbar segmentation appears to be normal and will be designated as such for this report. Alignment:  Preserved, somewhat exaggerated lumbar lordosis. No spondylolisthesis. Vertebrae: No marrow edema or evidence of acute osseous abnormality. Background bone marrow signal mildly heterogeneous but within normal limits. Visible sacrum and SI joints appear intact. Conus medullaris and cauda equina: Conus extends to the L1-L2 level. No lower spinal cord or conus signal abnormality. Beginning just below the L2-L3 disc level there is architectural distortion within the thecal sac with adhesions and clumping of nerve roots (series 6, image 18) which continues to the sacrum compatible with chronic arachnoiditis. No evidence of intrathecal mass. Paraspinal and other soft tissues: Tortuous abdominal aorta. Nonspecific mild bilateral perinephric fluid. Lumbar paraspinal soft tissues are within normal limits. Disc levels: Generally mild for age lumbar spine degeneration. No spinal stenosis above L4-L5. L4-L5: Circumferential disc bulge and endplate spurring with moderate posterior element hypertrophy. Arachnoiditis with distorted cauda equina. Mild to moderate spinal stenosis. Mild right L4 foraminal stenosis. L5-S1: Disc space loss with bulky right anterolateral disc osteophyte complex. Mild facet hypertrophy.  Arachnoiditis. No spinal stenosis. There is mild to moderate right lateral recess stenosis (right S1 nerve level series 3, image 7). Mild to moderate right L5 foraminal stenosis. IMPRESSION: 1. 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 No recurrent spinal tumor is evident. 2. Superimposed lumbar spine degeneration primarily at L4-L5 and L5-S1. L4-L5 multifactorial mild to moderate multifactorial spinal stenosis, mild right L4 neural foraminal stenosis. L5-S1 moderate right lateral recess and foraminal stenosis. Query Right L5 and/or S1 radiculitis. Electronically Signed   By: Genevie Ann M.D.   On: 04/20/2020 08:23   US SCROTUM  Result Date: 05/05/2020 CLINICAL DATA:  80 year old with scrotal swelling. EXAM: ULTRASOUND OF SCROTUM TECHNIQUE: Complete ultrasound examination of the testicles, epididymis, and other scrotal structures was performed. COMPARISON:  None. FINDINGS: Right testicle Measurements: 4.2 x 2.1 x 2.9 cm. Rete tubular ectasia with small cysts at the mediastinum testis. No solid intra testicular mass. Normal blood flow. Left testicle Measurements: 3.3 x 1.7 x 2.5 cm. Testicular echotexture is heterogeneous. Ill-defined area of heterogeneously decreased echogenicity in the lateral testis without well-defined borders measures approximately 1.2 x 0.8 x 1.3 cm. Similar internal vascularity to the adjacent testicular parenchyma. There is an intratesticular cyst laterally measuring 6 mm. Normal parenchymal blood flow. Right epididymis: Tiny epididymal head cyst. There is also a tiny cyst in the mid epididymal body. No solid lesion or hyperemia. Left epididymis:  Epididymal head cyst measuring 8 mm. Hydrocele:  Small bilaterally, right greater than left. Varicocele:  Present bilaterally. Other: Bilateral scrotal skin thickening. No subcutaneous collection. IMPRESSION: 1. Bilateral scrotal skin thickening. 2. Right rete tubular  ectasia. 3. There is an intra testicular ill-defined heterogeneously hypoechoic area in the left lateral testicle measuring approximately 1.3 cm with some internal blood flow. This has nonspecific imaging features. Differential considerations include unusual appearance of rete testis versus intra testicular mass. Presence of internal blood flow favors against testicular hematoma or segmental infarct. Recommend urologic consultation and potentially MRI for more detailed evaluation. There is also a subcentimeter left intra testicular cyst. 4. Small bilateral hydroceles.  Bilateral varicoceles. 5. Bilateral epididymal head cysts. Electronically Signed   By: Keith Rake M.D.   On: 05/05/2020 19:08   US RENAL  Result Date: 05/03/2020 CLINICAL DATA:  Acute renal injury. EXAM: RENAL / URINARY TRACT ULTRASOUND COMPLETE COMPARISON:  No recent. FINDINGS: Right Kidney: Renal measurements: 11.8 x 5.4 x 5.4 cm = volume: 178.8 mL. Increased echogenicity. No  mass. Mild prominence of the right renal pelvis noted. Left Kidney: Renal measurements: 12.5 x 6.6 x 5.9 cm = volume: 252.8 mL. Increased echogenicity. No mass or hydronephrosis visualized. Bladder: Bladder is distended and contains debris. Bladder infection could present this fashion. Bilateral ureteral jets visualized. Prevoid volume 951.9 cc. Other: None. IMPRESSION: 1. Bladder is distended and contains debris. Bladder infection could present this fashion. Prevoid volume 951.9 CC. Mild prominence right renal pelvis noted. 2. Increased echogenicity both kidneys consistent with chronic medical renal disease. Electronically Signed   By: Marcello Moores  Register   On: 05/03/2020 12:30   CT Hip Left Wo Contrast  Result Date: 04/19/2020 CLINICAL DATA:  Hip pain. EXAM: CT OF THE LEFT HIP WITHOUT CONTRAST TECHNIQUE: Multidetector CT imaging of the left hip was performed according to the standard protocol. Multiplanar CT image reconstructions were also generated. COMPARISON:   X-ray earlier same day FINDINGS: Bones/Joint/Cartilage No evidence for an acute fracture in the visualized left hemipelvis or proximal left femur. Specifically, no evidence for femoral neck fracture. No disruption of the tensile or compressive trabeculated. Patient is noted to have degenerative changes in the acetabulum at least 2 small ossified intra-articular loose bodies in the joint space of the left hip. No substantial joint effusion. Ligaments Suboptimally assessed by CT. Muscles and Tendons No intramuscular or subcutaneous hemorrhage/hematoma. Soft tissues Unremarkable. IMPRESSION: 1. No evidence for acute fracture in the visualized left hemipelvis or proximal left femur. Specifically, no evidence for femoral neck fracture. 2. Degenerative changes in the left hip with at least 2 small ossified intra-articular loose bodies in the joint space of the left hip. Electronically Signed   By: Misty Stanley M.D.   On: 04/19/2020 05:16   DG CHEST PORT 1 VIEW  Result Date: 05/09/2020 CLINICAL DATA:  Shortness of breath, cough EXAM: PORTABLE CHEST 1 VIEW COMPARISON:  Portable exam 0929 hours compared to 05/08/2020 FINDINGS: Feeding tube extends into stomach. Normal heart size, mediastinal contours, and pulmonary vascularity. No definite acute infiltrate, pleural effusion or pneumothorax. Minimal bibasilar atelectasis noted. Osseous structures unremarkable. IMPRESSION: Minimal bibasilar atelectasis. Electronically Signed   By: Lavonia Dana M.D.   On: 05/09/2020 09:39   DG CHEST PORT 1 VIEW  Result Date: 05/08/2020 CLINICAL DATA:  Cough. EXAM: PORTABLE CHEST 1 VIEW COMPARISON:  May 05, 2020. FINDINGS: The heart size and mediastinal contours are within normal limits. Both lungs are clear. The visualized skeletal structures are unremarkable. IMPRESSION: No active disease. Electronically Signed   By: Marijo Conception M.D.   On: 05/08/2020 16:21   DG CHEST PORT 1 VIEW  Result Date: 05/05/2020 CLINICAL DATA:   Shortness of breath. EXAM: PORTABLE CHEST 1 VIEW COMPARISON:  CT chest, abdomen and pelvis and single view of the chest 05/04/2020. FINDINGS: Lungs are clear. Heart size is normal. No pneumothorax or pleural effusion. IMPRESSION: No acute disease. Electronically Signed   By: Inge Rise M.D.   On: 05/05/2020 10:37   DG CHEST PORT 1 VIEW  Result Date: 05/04/2020 CLINICAL DATA:  Shortness of breath EXAM: PORTABLE CHEST 1 VIEW COMPARISON:  Chest x-ray dated 01/10/2006 FINDINGS: Heart size is normal. Masslike density at the RIGHT hilum. Diffuse interstitial prominence, presumably edema. Additional bilateral perihilar and basilar opacities. No pleural effusion or pneumothorax is seen. Osseous structures about the chest are unremarkable. Enteric tube passes below the diaphragm. IMPRESSION: 1. Masslike density at the RIGHT hilum, suspicious for neoplastic mass or lymphadenopathy, less likely perihilar pneumonia. Recommend chest CT for further characterization. 2.  Diffuse bilateral interstitial prominence, presumably some degree of edema/volume overload. 3. Additional patchy opacities within the bilateral perihilar and basilar regions, pneumonia versus pulmonary edema, alternatively pleural plaques related to previous asbestos exposure. 4. Enteric tube passes below the diaphragm. Electronically Signed   By: Franki Cabot M.D.   On: 05/04/2020 08:22   DG Swallowing Func-Speech Pathology  Result Date: 05/04/2020 Objective Swallowing Evaluation: Type of Study: MBS-Modified Barium Swallow Study  Patient Details Name: John Lambert MRN: [id number] Date of Birth: 06-25-1940 Today's Date: 05/04/2020 Time: SLP Start Time (ACUTE ONLY): [id number] -SLP Stop Time (ACUTE ONLY): 0853 SLP Time Calculation (min) (ACUTE ONLY): 18 min Past Medical History: Past Medical History: Diagnosis Date  Blood clotting disorder ([place])   Blood dyscrasia   Cancer ([place])   melanoma removed from right calf  Hypertension   Neuromuscular disorder  ([place])   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 Past Surgical History: Past Surgical History: Procedure Laterality Date  IR ANGIO INTRA EXTRACRAN SEL COM CAROTID INNOMINATE UNI L MOD SED  04/27/2020  RADIOLOGY WITH ANESTHESIA N/A 04/27/2020  Procedure: IR WITH ANESTHESIA;  Surgeon: Radiologist, Medication, MD;  Location: [facility];  Service: Radiology;  Laterality: N/A;  SKIN BIOPSY    TRANSCAROTID ARTERY REVASCULARIZATION Left 04/27/2020  Procedure: LEFT TRANSCAROTID ARTERY REVASCULARIZATION;  Surgeon: Serafina Mitchell, MD;  Location: [facility] OR;  Service: Vascular;  Laterality: Left;  ULTRASOUND GUIDANCE FOR VASCULAR ACCESS Left 04/27/2020  Procedure: ULTRASOUND GUIDANCE FOR VASCULAR ACCESS;  Surgeon: Serafina Mitchell, MD;  Location: [facility] OR;  Service: Vascular;  Laterality: Left; HPI: Pt is an 80 y.o. 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 He also has pain in left upper leg/hip region, and R foot drag; Imaging notable for Numerous scattered punctate acute infarctions in the left  posterior frontal region consistent with micro embolic infarctions  which could either be in the anterior or middle cerebral artery  territory. Punctate acute infarction also affecting the splenium of  the corpus callosum. Pt was seen by SLP on 12/17 for a BSE and SLE with no subsequent need for SLP services. Left carotid TCAR procedure with stent placement conducted on 12/22. Following the procedure he was found to be aphasic unable to speak with left gaze deviation and right hemiparesis. Emergent CTA showed patent left carotid stent with good flow in the terminal left ICA as well as middle cerebral artery in the M1 segment. There appeared to be decreased flow in one of the M2 branches by neurology. SLP was consulted due to pt failing the [place], but a failed  swallow screen has not been documented in EMR. EEG 12/22: No seizures  Subjective: alert, not very verbally communicative but responding with gesture Assessment / Plan / Recommendation CHL IP CLINICAL IMPRESSIONS 05/04/2020 Clinical Impression Pt's presentation appears similar to that noted during the last MBS on 12/24, but with improved laryngeal sensation. He presented with oorpharyngeal dysphagia characterized by reduced posterior bolus propulsion, lingual retraction, and reduced anterior laryngeal moment. He exhibited vallecular residue, pyriform sinus residue, incomplete epiglottic inversion, reduced laryngeal vestibule closure, and an inconsistent pharyngeal delay. Aspiration (PAS 7) was inconsistently noted with full-tsp boluses nectar thick liquids and more consistently with full-tsp boluses of honey thick liquids. Aspiration was secondary to impaired timing of the swallow or aspiration of pyriform sinus residue during secondary swallows. All episodes of  aspiration resulted in coughing, but cough was weak and ineffective. Amount of residue was improved with a left head turn posture and frequency of aspiration reduced. However, pt exhibited difficulty demonstrating with full lateralization to the left and penetration (PAS 3) was noted when his head was close to midline. It is recommended that nonoral alimentation be continued. Use of multiple precautions would still be necessary to ensure safety of p.o. intake including use of 1/2 tsp boluses of liquids and a left head turn. SLP anticipates that pt will have difficulty consistently using all of these and his risk of aspiration is moderate to high without their observace. SLP Visit Diagnosis Dysphagia, oropharyngeal phase (R13.12) Attention and concentration deficit following -- Frontal lobe and executive function deficit following -- Impact on safety and function Moderate aspiration risk;Risk for inadequate nutrition/hydration   CHL IP TREATMENT  RECOMMENDATION 05/04/2020 Treatment Recommendations Therapy as outlined in treatment plan below;F/U MBS in --- days (Comment)   Prognosis 05/04/2020 Prognosis for Safe Diet Advancement Good Barriers to Reach Goals Severity of deficits Barriers/Prognosis Comment -- CHL IP DIET RECOMMENDATION 05/04/2020 SLP Diet Recommendations Other (Comment);Alternative means - long-term Liquid Administration via -- Medication Administration Via alternative means Compensations Slow rate;Small sips/bites;Follow solids with liquid Postural Changes --   CHL IP OTHER RECOMMENDATIONS 05/04/2020 Recommended Consults -- Oral Care Recommendations Oral care QID Other Recommendations --   CHL IP FOLLOW UP RECOMMENDATIONS 05/04/2020 Follow up Recommendations Inpatient Rehab   CHL IP FREQUENCY AND DURATION 05/04/2020 Speech Therapy Frequency (ACUTE ONLY) min 2x/week Treatment Duration 2 weeks      CHL IP ORAL PHASE 05/04/2020 Oral Phase Impaired Oral - Pudding Teaspoon -- Oral - Pudding Cup -- Oral - Honey Teaspoon Reduced posterior propulsion Oral - Honey Cup Reduced posterior propulsion Oral - Nectar Teaspoon Reduced posterior propulsion Oral - Nectar Cup Reduced posterior propulsion Oral - Nectar Straw Reduced posterior propulsion Oral - Thin Teaspoon -- Oral - Thin Cup -- Oral - Thin Straw Reduced posterior propulsion;Lingual pumping Oral - Puree Reduced posterior propulsion;Lingual pumping Oral - Mech Soft -- Oral - Regular -- Oral - Multi-Consistency -- Oral - Pill -- Oral Phase - Comment --  CHL IP PHARYNGEAL PHASE 05/04/2020 Pharyngeal Phase Impaired Pharyngeal- Pudding Teaspoon -- Pharyngeal -- Pharyngeal- Pudding Cup -- Pharyngeal -- Pharyngeal- Honey Teaspoon Penetration/Aspiration before swallow;Penetration/Aspiration during swallow;Pharyngeal residue - valleculae;Pharyngeal residue - pyriform;Reduced airway/laryngeal closure;Reduced tongue base retraction;Reduced anterior laryngeal mobility Pharyngeal Material enters airway, passes  BELOW cords and not ejected out despite cough attempt by patient;Material enters airway, remains ABOVE vocal cords and not ejected out Pharyngeal- Honey Cup Penetration/Aspiration before swallow;Penetration/Aspiration during swallow;Pharyngeal residue - valleculae;Pharyngeal residue - pyriform;Reduced airway/laryngeal closure;Reduced tongue base retraction;Reduced anterior laryngeal mobility Pharyngeal Material enters airway, passes BELOW cords and not ejected out despite cough attempt by patient Pharyngeal- Nectar Teaspoon Penetration/Aspiration before swallow;Penetration/Aspiration during swallow;Pharyngeal residue - valleculae;Pharyngeal residue - pyriform;Reduced airway/laryngeal closure;Reduced tongue base retraction;Reduced anterior laryngeal mobility Pharyngeal Material enters airway, passes BELOW cords and not ejected out despite cough attempt by patient Pharyngeal- Nectar Cup Penetration/Aspiration before swallow;Penetration/Aspiration during swallow;Pharyngeal residue - valleculae;Pharyngeal residue - pyriform;Reduced airway/laryngeal closure;Reduced tongue base retraction;Reduced anterior laryngeal mobility Pharyngeal Material enters airway, passes BELOW cords and not ejected out despite cough attempt by patient Pharyngeal- Nectar Straw -- Pharyngeal -- Pharyngeal- Thin Teaspoon -- Pharyngeal -- Pharyngeal- Thin Cup -- Pharyngeal -- Pharyngeal- Thin Straw NT Pharyngeal -- Pharyngeal- Puree Pharyngeal residue - valleculae;Pharyngeal residue - pyriform;Reduced airway/laryngeal closure;Reduced anterior laryngeal mobility;Reduced tongue base retraction Pharyngeal -- Pharyngeal- Mechanical  Soft -- Pharyngeal -- Pharyngeal- Regular -- Pharyngeal -- Pharyngeal- Multi-consistency -- Pharyngeal -- Pharyngeal- Pill -- Pharyngeal -- Pharyngeal Comment --  CHL IP CERVICAL ESOPHAGEAL PHASE 05/04/2020 Cervical Esophageal Phase WFL Pudding Teaspoon -- Pudding Cup -- Honey Teaspoon -- Honey Cup -- Nectar Teaspoon --  Nectar Cup -- Nectar Straw -- Thin Teaspoon -- Thin Cup -- Thin Straw -- Puree -- Mechanical Soft -- Regular -- Multi-consistency -- Pill -- Cervical Esophageal Comment -- Shanika I. Hardin Negus, [place], [place] Office number [phone] Pager [place] 05/04/2020, 10:23 AM              EEG adult  Result Date: 05/05/2020 Lora Havens, MD     05/05/2020  1:47 PM Patient Name: John Lambert MRN: [id number] Epilepsy Attending: Lora Havens Referring Physician/Provider: Dr Rosalin Hawking Date: 05/05/2020 Duration: 24.53 mins Patient history: 80yo M with left side stroke. EEG to evaluate for seizure Level of alertness: Awake, asleep AEDs during EEG study: None Technical aspects: This EEG study was done with scalp electrodes positioned according to the 10-20 International system of electrode placement. Electrical activity was acquired at a sampling rate of 500Hz  and reviewed with a high frequency filter of 70Hz  and a low frequency filter of 1Hz . EEG data were recorded continuously and digitally stored. Description: The posterior dominant rhythm consists of 8 Hz activity of moderate voltage (25-35 uV) seen predominantly in posterior head regions, symmetric and reactive to eye opening and eye closing.Sleep was characterized by vertex waves, maximal frontocentral region EEG showed intermittent 3-5hz  theta-delta slowing in left hemisphere, maximal left frontotemporal region. Hyperventilation and photic stimulation were not performed.  ABNORMALITY -Intermiitent slow, left hemisphere, maximal left frontotemporal region  IMPRESSION: This study is suggestive of cortical dysfunction in left hemisphere, maximal left fronto-temporal region likely secondary to underlying stroke/structural abnormality. No seizures or epileptiform discharges were seen throughout the recording. EEG appears to be improving to previous study.  Lora Havens   EEG adult  Result Date:  04/27/2020 Lora Havens, MD     05/05/2020  1:38 PM Patient Name: KAYLOR BORT MRN: [id number] Epilepsy Attending: Lora Havens Referring Physician/Provider: Dr Antony Contras Date: 04/27/2020 Duration: 25.57 mins Patient history: 80 year old Caucasian male with sudden onset of expressive aphasia left gaze deviation and right hemiplegia. EEG to evaluate for seizure Level of alertness: Awake AEDs during EEG study: None Technical aspects: This EEG study was done with scalp electrodes positioned according to the 10-20 International system of electrode placement. Electrical activity was acquired at a sampling rate of 500Hz  and reviewed with a high frequency filter of 70Hz  and a low frequency filter of 1Hz . EEG data were recorded continuously and digitally stored. Description: The posterior dominant rhythm consists of 8 Hz activity of moderate voltage (25-35 uV) seen predominantly in posterior head regions, symmetric and reactive to eye opening and eye closing. EEG showed continuous generalized 3 to 6 Hz theta-delta slowing in left hemisphere, maximal left frontotemporal region, which at times appears sharply contoured. Hyperventilation and photic stimulation were not performed. ABNORMALITY -Continuous slow, left hemisphere, maximal left frontotemporal region IMPRESSION: This study is suggestive of cortical dysfunction in left hemisphere, maximal left fronto-temporal region likely secondary to underlying stroke/structural abnormality. No seizures or epileptiform discharges were seen throughout the recording. Lora Havens   ECHOCARDIOGRAM COMPLETE BUBBLE STUDY  Result Date: 04/20/2020    ECHOCARDIOGRAM REPORT   Patient Name:   John Lambert [facility] Date of Exam: 04/20/2020 Medical  Rec #:  [id number]      Height:       74.0 in Accession #:    [id number]     Weight:       167.0 lb Date of Birth:  07-22-1940      BSA:          2.013 m Patient Age:    75 years       BP:           120/51 mmHg Patient Gender: M               HR:           70 bpm. Exam Location:  Inpatient Procedure: 2D Echo, Color Doppler, Cardiac Doppler and Saline Contrast Bubble            Study Indications:    Stroke i163.9  History:        Patient has no prior history of Echocardiogram examinations.                 Risk Factors:Hypertension and Dyslipidemia.  Sonographer:    Raquel Sarna Senior RDCS Referring Phys: [id number] Antoine  1. Left ventricular ejection fraction, by estimation, is 60 to 65%. The left ventricle has normal function. The left ventricle has no regional wall motion abnormalities. There is moderate left ventricular hypertrophy of the basal-septal segment. Left ventricular diastolic parameters are consistent with Grade I diastolic dysfunction (impaired relaxation).  2. Right ventricular systolic function is normal. The right ventricular size is normal. Tricuspid regurgitation signal is inadequate for assessing PA pressure.  3. The mitral valve is normal in structure. Trivial mitral valve regurgitation. No evidence of mitral stenosis.  4. The aortic valve is tricuspid. Aortic valve regurgitation is trivial. Mild to moderate aortic valve sclerosis/calcification is present, without any evidence of aortic stenosis.  5. The inferior vena cava is normal in size with <50% respiratory variability, suggesting right atrial pressure of 8 mmHg. FINDINGS  Left Ventricle: Left ventricular ejection fraction, by estimation, is 60 to 65%. The left ventricle has normal function. The left ventricle has no regional wall motion abnormalities. The left ventricular internal cavity size was normal in size. There is  moderate left ventricular hypertrophy of the basal-septal segment. Left ventricular diastolic parameters are consistent with Grade I diastolic dysfunction (impaired relaxation). Normal left ventricular filling pressure. Right Ventricle: The right ventricular size is normal. No increase in right ventricular wall thickness. Right ventricular  systolic function is normal. Tricuspid regurgitation signal is inadequate for assessing PA pressure. Left Atrium: Left atrial size was normal in size. Right Atrium: Right atrial size was normal in size. Pericardium: There is no evidence of pericardial effusion. Mitral Valve: The mitral valve is normal in structure. There is mild calcification of the anterior mitral valve leaflet(s). Mild mitral annular calcification. Trivial mitral valve regurgitation. No evidence of mitral valve stenosis. Tricuspid Valve: The tricuspid valve is normal in structure. Tricuspid valve regurgitation is trivial. No evidence of tricuspid stenosis. Aortic Valve: The aortic valve is tricuspid. Aortic valve regurgitation is trivial. Mild to moderate aortic valve sclerosis/calcification is present, without any evidence of aortic stenosis. Pulmonic Valve: The pulmonic valve was normal in structure. Pulmonic valve regurgitation is not visualized. No evidence of pulmonic stenosis. Aorta: The aortic root is normal in size and structure. Venous: The inferior vena cava is normal in size with less than 50% respiratory variability, suggesting right atrial pressure of 8 mmHg. IAS/Shunts: No atrial level shunt detected by color  flow Doppler. Agitated saline contrast was given intravenously to evaluate for intracardiac shunting.  LEFT VENTRICLE PLAX 2D LVIDd:         2.70 cm  Diastology LVIDs:         1.90 cm  LV e' medial:    6.42 cm/s LV PW:         1.30 cm  LV E/e' medial:  9.1 LV IVS:        1.40 cm  LV e' lateral:   6.09 cm/s LVOT diam:     2.40 cm  LV E/e' lateral: 9.6 LV SV:         89 LV SV Index:   44 LVOT Area:     4.52 cm  RIGHT VENTRICLE TAPSE (M-mode): 1.6 cm LEFT ATRIUM             Index       RIGHT ATRIUM           Index LA diam:        3.60 cm 1.79 cm/m  RA Area:     19.20 cm LA Vol (A2C):   50.5 ml 25.09 ml/m RA Volume:   48.50 ml  24.09 ml/m LA Vol (A4C):   52.7 ml 26.18 ml/m LA Biplane Vol: 52.4 ml 26.03 ml/m  AORTIC VALVE LVOT  Vmax:   83.60 cm/s LVOT Vmean:  58.400 cm/s LVOT VTI:    0.197 m  AORTA Ao Root diam: 3.50 cm Ao Asc diam:  3.50 cm MITRAL VALVE MV Area (PHT): 2.39 cm    SHUNTS MV Decel Time: 317 msec    Systemic VTI:  0.20 m MV E velocity: 58.30 cm/s  Systemic Diam: 2.40 cm MV A velocity: 71.60 cm/s MV E/A ratio:  0.81 Fransico Him MD Electronically signed by Fransico Him MD Signature Date/Time: 04/20/2020/1:36:43 PM    Final    DG Hip Unilat W or Wo Pelvis 2-3 Views Left  Result Date: 04/19/2020 CLINICAL DATA:  Fall, left hip pain EXAM: DG HIP (WITH OR WITHOUT PELVIS) 2-3V LEFT COMPARISON:  None. FINDINGS: Single view radiograph of the pelvis and two view radiograph of the left hip demonstrates normal alignment. No fracture or dislocation. There is at least mild left hip degenerative arthritis with asymmetric joint space narrowing. Vascular calcifications are seen within the left hemipelvis. IMPRESSION: No acute fracture or dislocation. Electronically Signed   By: Fidela Salisbury MD   On: 04/19/2020 02:57   DG FEMUR MIN 2 VIEWS LEFT  Result Date: 04/19/2020 CLINICAL DATA:  Fall, pain EXAM: LEFT FEMUR 2 VIEWS COMPARISON:  None. FINDINGS: No acute bony abnormality. Specifically, no fracture, subluxation, or dislocation. Joint spaces maintained. IMPRESSION: No acute bony abnormality. Electronically Signed   By: Rolm Baptise M.D.   On: 04/19/2020 02:57   CT HEAD CODE STROKE WO CONTRAST  Result Date: 04/27/2020 CLINICAL DATA:  Code stroke.  Right-sided gaze.  Flaccid right-sided EXAM: CT HEAD WITHOUT CONTRAST TECHNIQUE: Contiguous axial images were obtained from the base of the skull through the vertex without intravenous contrast. COMPARISON:  None. FINDINGS: Brain: Small remote cortical infarcts in the posterior right frontal and left frontal parietal region at the vertex. Subacute infarcts in the left frontal lobe by prior brain MRI are in apparent on this study. Chronic small vessel disease with chronic appearing  lacune. At the bilateral caudate nuclei. Confluent chronic small vessel ischemia in the cerebral white matter. No hemorrhage, hydrocephalus, or masslike finding. Cerebral atrophy. Vascular: Prominent density of the supraclinoid  left ICA on axial slices, but not persisting on reformats. A CTA is already ordered. Atherosclerosis Skull: Normal. Negative for fracture or focal lesion. Sinuses/Orbits: Retention cyst in the left maxillary sinus. Other: These results were communicated to Dr. Leonie Man at 10:17 [place] 12/22/2021by text page via the [facility] messaging system. ASPECTS [facility] Stroke Program Early CT Score) - Ganglionic level infarction (caudate, lentiform nuclei, internal capsule, insula, M1-M3 cortex): 7 - Supraganglionic infarction (M4-M6 cortex): 3 (when accounting for chronic infarction) Total score (0-10 with 10 being normal): 10 IMPRESSION: 1. No hemorrhage or visible acute infarct. 2. Subacute infarcts in the left frontal lobe by recent brain MRI are not visible today. 3. Small remote cortical infarcts.  Chronic small vessel disease. Electronically Signed   By: Monte Fantasia M.D.   On: 04/27/2020 10:20   VAS US CAROTID  Result Date: 04/21/2020 Carotid Arterial Duplex Study Indications:       CVA. Risk Factors:      Hypertension, Diabetes, past history of smoking, coronary                    artery disease. Comparison Study:  No prior studies. Performing Technologist: Darlin Coco RDMS  Examination Guidelines: A complete evaluation includes B-mode imaging, spectral Doppler, color Doppler, and power Doppler as needed of all accessible portions of each vessel. Bilateral testing is considered an integral part of a complete examination. Limited examinations for reoccurring indications may be performed as noted.  Right Carotid Findings: +----------+--------+--------+--------+------------------+--------+             PSV cm/s EDV cm/s Stenosis Plaque Description Comments   +----------+--------+--------+--------+------------------+--------+  CCA Prox   103      15                                             +----------+--------+--------+--------+------------------+--------+  CCA Distal 86       14                                             +----------+--------+--------+--------+------------------+--------+  ICA Prox   54       14       1-39%    heterogenous                 +----------+--------+--------+--------+------------------+--------+  ICA Distal 73       15                                             +----------+--------+--------+--------+------------------+--------+  ECA        90                                                      +----------+--------+--------+--------+------------------+--------+ +----------+--------+-------+----------------+-------------------+             PSV cm/s EDV cms Describe         Arm Pressure (mmHG)  +----------+--------+-------+----------------+-------------------+  Subclavian 117  Multiphasic, WNL                      +----------+--------+-------+----------------+-------------------+ +---------+--------+--+--------+-+---------+  Vertebral PSV cm/s 45 EDV cm/s 9 Antegrade  +---------+--------+--+--------+-+---------+  Left Carotid Findings: +----------+--------+--------+--------+--------------------+-------------------+             PSV cm/s EDV cm/s Stenosis Plaque Description   Comments             +----------+--------+--------+--------+--------------------+-------------------+  CCA Prox   106      14                                                          +----------+--------+--------+--------+--------------------+-------------------+  CCA Distal 88       18                                                          +----------+--------+--------+--------+--------------------+-------------------+  ICA Prox   364      97       80-99%   heterogenous and     80-99% based on                                             hypoechoic            peak systolic                                                                    velocities and                                                                   IC/CC ratio          +----------+--------+--------+--------+--------------------+-------------------+  ICA Mid    404      70       80-99%   heterogenous and     80-99% based on                                             hypoechoic           peak systolic  velocities and                                                                   IC/CC ratio          +----------+--------+--------+--------+--------------------+-------------------+  ICA Distal 170      33                                     tortuous             +----------+--------+--------+--------+--------------------+-------------------+  ECA        103      10                                                          +----------+--------+--------+--------+--------------------+-------------------+ +----------+--------+--------+----------------+-------------------+             PSV cm/s EDV cm/s Describe         Arm Pressure (mmHG)  +----------+--------+--------+----------------+-------------------+  Subclavian 140               Multiphasic, WNL                      +----------+--------+--------+----------------+-------------------+ +---------+--------+--+--------+--+---------+  Vertebral PSV cm/s 47 EDV cm/s 13 Antegrade  +---------+--------+--+--------+--+---------+   Summary: Right Carotid: Velocities in the right ICA are consistent with a 1-39% stenosis. Left Carotid: Velocities in the left ICA are consistent with a 80-99% stenosis               based on peak systolic velocities and IC/CC ratio. Vertebrals:  Bilateral vertebral arteries demonstrate antegrade flow. Subclavians: Normal flow hemodynamics were seen in bilateral subclavian              arteries. *See table(s) above for measurements and observations.  Electronically  signed by Rosalin Hawking MD on 04/21/2020 at 8:18:18 PM.    Final    VAS [place] LOWER EXTREMITY VENOUS (DVT)  Result Date: 04/20/2020  Lower Venous DVT Study Indications: Swelling. Other Indications: History of DVT and PE. Risk Factors: Cancer Hx melanoma removed from RT calf. Anticoagulation: Long term coumadin. Comparison Study: No prior studies. Performing Technologist: Darlin Coco RDMS  Examination Guidelines: A complete evaluation includes B-mode imaging, spectral Doppler, color Doppler, and power Doppler as needed of all accessible portions of each vessel. Bilateral testing is considered an integral part of a complete examination. Limited examinations for reoccurring indications may be performed as noted. The reflux portion of the exam is performed with the patient in reverse Trendelenburg.  +---------+---------------+---------+-----------+----------+-----------------+  RIGHT     Compressibility Phasicity Spontaneity Properties Thrombus Aging     +---------+---------------+---------+-----------+----------+-----------------+  CFV       Partial         Yes       Yes                    Age Indeterminate  +---------+---------------+---------+-----------+----------+-----------------+  SFJ  Yes       Yes                                       +---------+---------------+---------+-----------+----------+-----------------+  FV Prox                   Yes       Yes                                       +---------+---------------+---------+-----------+----------+-----------------+  FV Mid                    Yes       Yes                                       +---------+---------------+---------+-----------+----------+-----------------+  FV Distal                 Yes       Yes                                       +---------+---------------+---------+-----------+----------+-----------------+  PFV                       Yes       Yes                                        +---------+---------------+---------+-----------+----------+-----------------+  POP       Partial         Yes       Yes                    Age Indeterminate  +---------+---------------+---------+-----------+----------+-----------------+  PTV                       Yes       Yes                                       +---------+---------------+---------+-----------+----------+-----------------+  PERO                      Yes       Yes                                       +---------+---------------+---------+-----------+----------+-----------------+   +---------+---------------+---------+-----------+----------+--------------+  LEFT      Compressibility Phasicity Spontaneity Properties Thrombus Aging  +---------+---------------+---------+-----------+----------+--------------+  CFV       Full            Yes       Yes                                    +---------+---------------+---------+-----------+----------+--------------+  SFJ  Full                                                             +---------+---------------+---------+-----------+----------+--------------+  FV Prox   Full                                                             +---------+---------------+---------+-----------+----------+--------------+  FV Mid    Full                                                             +---------+---------------+---------+-----------+----------+--------------+  FV Distal Full                                                             +---------+---------------+---------+-----------+----------+--------------+  PFV       Full                                                             +---------+---------------+---------+-----------+----------+--------------+  POP       Full            Yes       Yes                                    +---------+---------------+---------+-----------+----------+--------------+  PTV       Full                                                              +---------+---------------+---------+-----------+----------+--------------+  PERO      Full                                                             +---------+---------------+---------+-----------+----------+--------------+     Summary: RIGHT: - Findings consistent with partial, non-obstructing age indeterminate deep vein thrombosis involving the right common femoral vein, and right popliteal vein. - No cystic structure found in the popliteal fossa.  LEFT: - No evidence of common femoral vein obstruction.  *See table(s) above for measurements and observations. Electronically signed by Harold Barban MD on 04/20/2020 at 10:04:28  PM.    Final    Structural Heart Procedure  Result Date: 04/27/2020 See surgical note for result.  US Abdomen Limited RUQ (LIVER/GB)  Result Date: 05/07/2020 CLINICAL DATA:  Abnormal liver function tests. EXAM: ULTRASOUND ABDOMEN LIMITED RIGHT UPPER QUADRANT COMPARISON:  May 04, 2020. FINDINGS: Gallbladder: No gallstones or wall thickening visualized. No sonographic Murphy sign noted by sonographer. Common bile duct: Diameter: 3 mm which is within normal limits. Liver: No focal lesion identified. Within normal limits in parenchymal echogenicity. Portal vein is patent on color Doppler imaging with normal direction of blood flow towards the liver. Other: None. IMPRESSION: No definite abnormality seen in the right upper quadrant of the abdomen. Electronically Signed   By: Marijo Conception M.D.   On: 05/07/2020 18:04   IR ANGIO INTRA EXTRACRAN SEL COM CAROTID INNOMINATE UNI L MOD SED  Result Date: 04/28/2020 CLINICAL DATA:  Onset of left-sided gaze deviation and right-sided weakness arm greater than leg with speech difficulties post surgery. EXAM: IR ANGIO INTRA EXTRACRAN SEL COM CAROTID INNOMINATE UNI LEFT MOD SED COMPARISON:  CT angiogram of the head and neck of April 27, 2020, and April 20, 2020. MEDICATIONS: Heparin 1000 units IV. No antibiotic was administered  within 1 hour of the procedure. ANESTHESIA/SEDATION: General anesthesia. CONTRAST:  Isovue 300 approximately 45 mL FLUOROSCOPY TIME:  Fluoroscopy Time: 4 minutes 18 seconds (427.1 mGy). COMPLICATIONS: None immediate. TECHNIQUE: Informed written consent was obtained from the patient after a thorough discussion of the procedural risks, benefits and alternatives. All questions were addressed. Maximal Sterile Barrier Technique was utilized including caps, mask, sterile gowns, sterile gloves, sterile drape, hand hygiene and skin antiseptic. A timeout was performed prior to the initiation of the procedure. The right groin was prepped and draped in the usual sterile fashion. Thereafter using modified Seldinger technique, transfemoral access into the right common femoral artery was obtained without difficulty. Over a 0.035 inch guidewire, a 5 French Pinnacle sheath was inserted. Through this, and also over 0.035 inch guidewire, a 5 [place] JB 1 catheter was advanced to the aortic arch region and selectively positioned in left common carotid artery. FINDINGS:.: FINDINGS:. The left common carotid arteriogram demonstrates the left external carotid artery and its major branches to be widely patent. The stent is seen positioned in the proximal left internal carotid artery extending into the distal left common carotid artery. The proximal portion of the stent at the level of the carotid bifurcation demonstrates step-off anteriorly and posteriorly suggestive of partial fracture. There is approximately 30% to 40% narrowing in the proximal portion of the stented segment of the ICA by the NASCET criteria, on the AP projection. More distally the vessel is seen to opacify to the cranial skull base. The petrous, the cavernous and the supraclinoid segments are widely patent. The left middle cerebral artery and the left anterior cerebral artery opacify into the capillary and venous phases. No angiographic evidence of intraluminal filling  defects or of occlusions are seen into the more tertiary branches of the left MCA distribution. Also seen is prompt opacification via the anterior communicating artery of the right anterior cerebral A2 segment and distally. IMPRESSION: Approximately 30-40% stenosis within the stented segment of the left ICA proximally on the AP projection. Intracranially no evidence of intraluminal filling defects, occlusions, stenosis or of dissections. PLAN: Patient was extubated successfully and returned to the PACU. Electronically Signed   By: Luanne Bras M.D.   On: 04/27/2020 14:15    Labs:  CBC: Recent Labs  05/06/20 0400 05/07/20 0207 05/08/20 0350 05/09/20 0957  WBC 8.8 10.7* 9.9 11.4*  HGB 10.6* 11.3* 11.0* 11.8*  HCT 31.9* 35.6* 33.8* 35.9*  PLT 299 392 359 392    COAGS: Recent Labs    04/26/20 2108 04/27/20 0253 05/05/20 0302 05/06/20 0400 05/07/20 0207 05/08/20 0350  INR 1.3*   < > 1.6* 1.5* 1.5* 1.4*  APTT 39*  --   --   --   --   --    < > = values in this interval not displayed.    BMP: Recent Labs    05/06/20 1523 05/07/20 0207 05/08/20 0350 05/09/20 0957  NA 147* 146* 144 136  K 4.5 4.7 4.5 4.6  CL 112* 111 108 102  CO2 24 25 26 23   GLUCOSE 132* 114* 132* 121*  BUN 40* 38* 36* 32*  CALCIUM 8.5* 8.5* 8.5* 8.3*  CREATININE 1.11 0.97 0.99 0.77  GFRNONAA >60 >60 >60 >60    LIVER FUNCTION TESTS: Recent Labs    05/06/20 0400 05/07/20 0207 05/08/20 0350 05/09/20 0957  BILITOT 0.8 1.3* 1.1 0.7  AST 58* 100* 64* 56*  ALT 60* 97* 90* 87*  ALKPHOS 32* 37* 37* 40  PROT 5.2* 5.6* 5.8* 5.8*  ALBUMIN 2.3* 2.4* 2.8* 2.7*    TUMOR MARKERS: No results for input(s): AFPTM, CEA, CA199, CHROMGRNA in the last 8760 hours.  Assessment and Plan:  Stroke; persistent dysphagia: John Lambert, 80 year old male, is tentatively scheduled for an image-guided gastrostomy tube placement 05/12/20 at the [facility] department. This procedure was  discussed with both the patient and his daughter who was at the bedside. The consent was signed by his daughter. Mr. Birky takes coumadin and Plavix; last dose of Coumadin was 05/05/20 at 1938. He has been placed on a heparin infusion which will need to be stopped the morning of the procedure. [place] for patient to remain on Plavix. Patient will need one cup of thin barium administered the night before the procedure.   Risks and benefits of an image guided gastrostomy tube placement were discussed with the patient including, but not limited to the need for a barium enema during the procedure, bleeding, infection, peritonitis and/or damage to adjacent structures.  All of the patient's questions were answered, patient is agreeable to proceed.  The patient will be NPO/tube feeds held after midnight on 05/12/20. AM labs will be ordered.   Consent signed and in chart.  Thank you for this interesting consult.  I greatly enjoyed meeting John Lambert and look forward to participating in their care.  A copy of this report was sent to the requesting provider on this date.  Electronically Signed: Soyla Dryer, AGACNP-BC [phone] 05/09/2020, 1:12 PM   I spent a total of 20 Minutes    in face to face in clinical consultation, greater than 50% of which was counseling/coordinating care for gastrostomy tube placement.

## 2020-05-09 NOTE — Progress Notes (Signed)
PROGRESS NOTE    John Lambert  [id number] DOB: [birth year] DOA: 04/19/2020 PCP: Harlan Stains, MD  Brief Narrative:  The patient is an 80 y.o.malewith PMH significant for hypertension, melanoma, neurofibroma, clotting disorder, history of DVT and PE on chronic Coumadinwhopresented to the ED on 12/14 with c/o : bilateral lower extremity weakness and difficulty ambulating associated with pain in the left hip region.(has a history of neurofibroma tumor removal from his spine about 4 years ago and melanoma removal from right calf about 30 years ago.) MRI brain obtainednoted numerousscattered punctate acute infarctions in the left posterior frontal region consistent with micro embolic infarctions which could either be in the anterior or middle cerebral arteryterritory. Punctate acute infarction also affecting the splenium ofthe corpus callosum.Extensive chronic small-vessel ischemic changes elsewherethroughout the brain.Small old left parietal vertex cortical and subcorticalInfarctions.  MRI lumbar spine showedchronicarachnoiditis with adhesions in the thecal sac and distorted cauda equina nerve roots from L2-L3 to the sacrum, but no evidence ofrecurrent spinal tumor.Possible right L5/S1 radiculitis after noting L4/5 multifactorial mild to moderate spinal stenosis. Vascular surgery was consulted and took patient on 12/22 for a left transcarotid artery stenting. Patient found to have 80% carotid stenosis and then following procedure down to less than 10% with stent. However, following surgery patient noted to have difficulty moving right side expressive aphasia and left gaze deviation and code stroke immediately called. Patient taken straight to CT scan. No new acute infarction noted. Patient underwent emergent left carotid diagnostic cerebral catheter angiogram with left MCA embolism noted from previous, but no acute findings. No intervention planned. Patient sent to medical ICU in  the absence of beds in the neuro ICU.EEG noted cortical dysfunction left hemisphere, but no seizures or epileptiform discharges seen throughout recording.  He was transferred to the stroke service on 04/27/2020 and then transferred out to the [facility] service on 04/29/2020.  Currently he continues to remain weak and his right upper arm is flaccid.  Now having gross hematuria as he has been placed on aspirin Plavix as well as heparin and Coumadin.  Heparin drip has now been stopped given that his Coumadin level therapeutic.  Next Coumadin will be held given his gross hematuria.  Renal function continues to worsen slightly.  Will need to continue monitor continue IV fluid hydration.  Will discuss with nephrology if renal function continues to worsen.  Case was discussed with urology Dr. Alexis Frock who recommends obtaining baseline imaging with a CT of the abdomen pelvis with and without contrast for hematuria.  Unfortunately imaging was not able to be obtained given his worsened renal function so this was done without contrast.  This showed possible pyelonephritis and pancreatitis was essentially ruled out given his normal lipase level.  Foley catheter was inserted yesterday and urine was sent off and grew out Klebsiella and we have placed him on antibiotics and his WBC is improving.  Renal function is improving now with Foley catheter drainage and he is diuresing fairly well.  Patient WBC is trending down as well as his renal function.  We will resume his anticoagulation on 05/05/2020.  Patient underwent CT scan of his chest abdomen pelvis yesterday.  He is little bit more somnolent today still case was discussed with neurology who ordered a repeat MRI which was unchanged and the EEG which still showed encephalopathy which was improved.  We will continue to monitor patient's clinical response closely.  Family today was concerned with some scrotal edema and erythema so his scrotum was elevated  and will obtain  scrotal ultrasound.  Likely this is in the setting of his anasarca and extreme volume overload  05/06/2020 scrotal edema is still there and he does have some bruising on his scrotum and a scrotal ultrasound was done below skin thickening in the right red tubular ectasia and there is also an ill-defined intratesticular heterogeneously hypoechoic area in the left lateral testicle as well as small bilateral hydroceles and bilateral varicoceles.  Urology was consulted and felt the patient had low suspicion of critical significant malignancy.  Recommend outpatient ultrasound in several months to verify stability..  Because of the scrotal ecchymosis and edema and Dr. Tresa Moore he was that this is just a cutaneous bruising and that it would take a few weeks for the color to change and feels that there is no hematoma abdomen on examination.  Urology recommends and trying a trial of void prior to discharge for having outpatient trial void in office.  Patient failed his swallow screen yesterday but he was awake and alert today.  Family wants to wait off on PEG tube placement and want another reevaluation prior to a being placed.  If patient fails again will pursue PEG tube Monday.  His sodium was also elevated induced insensible losses so his free water flushes were increased to 175 mL every 2 hours.  We will bit more awake and alert today  05/07/2020: Patient's renal function is improved but his hepatic function is slightly worsening so we will obtain a right upper quadrant ultrasound.  Patient underwent another swallow test and has failed so we will likely try to proceed with the PEG tube placement on Monday and have consulted interventional radiology.  WBC is slightly worse but he is on day 4 of antibiotics of IV ceftriaxone.  Overall hemoglobin remained stable and he is more awake and alert today than he was yesterday and his sodium is started to come down at 146.  05/08/2020 as labs are improving and hemoglobin is stable  as well as his renal function.  LFTs are slowly trending down now.  Interventional radiology was consulted for PEG tube placement and this cannot be done until Thursday, 05/13/2019.  They are recommending holding Coumadin until we will switch back to a heparin drip for now for anticoagulation and hold his Coumadin for 5 days as his last dose was 05/07/2020  05/09/2020 labs were relatively stable again and white blood cell count mildly jumped.  Plan is for a PEG tube placement on 05/13/2019.  Patient was coughing up a little bit more and repeat chest ray shows some mild bibasilar Elexis.  We will try and control his cough with antitussive's.  Assessment & Plan:   Principal Problem:   Weakness Active Problems:   Anticoagulated on warfarin   Impaired ambulation   Fall at home, initial encounter   Essential hypertension   Blood clotting disorder (HCC)   Cerebral embolism with cerebral infarction   Stroke [facility])   Middle cerebral artery embolism, left  Acute CVA on presentation from left carotid artery stenosis status post left carotid stenting with complications with  new stroke.   -He underwent elective left carotid transcranial procedure with stenting complicated by postop stroke affecting his speech he is completely aphasic and now with dense right hemiplegia. -He had an emergent CT angiogram and cerebral catheter angiogram which did not reveal large vessel occlusion. -EEG did not show any epileptiform activities. -MRI of the brain showed patchy left basal ganglia as well as left frontoparietal cortical  and subcortical infarcts which are new in addition to the previous recent left MCA frontal infarct which was subacute. -Seen by speech therapy modified barium swallow consistent with silent aspiration. -Core track tube was placed which he pulled it out. -Core track tube reinserted 05/02/2020 by dietary.  Tube feeds restarted 05/02/2020. -PT OT recommended CIR but now he is too weak to participate and  recommendation has been changed to SNF. -Hemoglobin A1c is 5.3, LDL is 84,  -Echo shows normal ejection fraction with no source of emboli. -Carotid Doppler with left ICA 80 to 99%. -His INR became therapeutic and then dropped when his Coumadin was held; last INR was 1.4 today and will hold his Coumadin again and place him on a heparin drip -Patient is on aspirin Plavix heparin and Coumadin and vascular surgery has recommended triple therapy for 1 month and then going on Coumadin and aspirin alone; currently remains on aspirin Plavix and Coumadin has been held and been transitioned to heparin drip for his PEG tube placement -Neurology recommends to continue aspirin and Plavix for 30 days for fresh carotid stent then aspirin alone.  His Coumadin was held initially for his hematuria but was resumed and now being held again given that he will be getting a percutaneous gastrostomy tube and will place on a heparin drip in the interim -Palliative care consulted for goals of care discussion and a plan in place in case the patient did not improve and family resistant for further goals of care and hopeful for SNF.  Palliative care will now follow peripherally -CT of the abdomen pelvis done and showed no hydronephrosis of either kidney so we will resume his anticoagulation given that his urinary cleared and improved; patient seems to be tolerating his anticoagulation well and has no overt signs of bleeding  Dyspnea with increased labored breathing acute respiratory failure with hypoxia, improving -Chest x-ray done this morning and showed "Masslike density at the RIGHT hilum, suspicious for neoplastic mass or lymphadenopathy, less likely perihilar pneumonia. Recommend chest CT for further characterization. Diffuse bilateral interstitial prominence, presumably some degree of edema/volume overload. Additional patchy opacities within the bilateral perihilar and basilar regions, pneumonia versus pulmonary edema,  alternatively pleural plaques related to previous asbestos exposure. Enteric tube passes below the diaphragm"  -Fluid stopped and given IV Lasix 40 mg x 1 a few days ago -CT scan showed no evidence of pneumonia but there was mild scarring/fibrotic change within his lung and there is no noted consolidation, pleural effusion or pneumothorax; CT did not mention any masslike consolidation -Started Xopenex/Atrovent breathing treatments -SpO2: 95 % O2 Flow Rate (L/min): 2 L/min -Continue supplemental oxygen via nasal cannula and wean O2 as follow -Continuous pulse oximetry maintain O2 saturation greater 90% -Also start empiric antibiotics in case this was a pneumonia and he is on IV ceftriaxone and IV azithromycin -Obtain procalcitonin level in the morning -Repeat chest x-ray today showed minimal atelectasis.  Left carotid artery stenosis vascular surgery status post carotid artery stenting  -Vascular was following and signed off the need aspirin and Plavix for at least a month  History of clotting disorder including PE DVT  -Was on Coumadin prior to admission.   -Doppler of the lower extremity shows chronic DVT.  Continued heparin and Coumadin but both of them and held.  INR was therapeutic but now trended back down; Pharmacy following INR and is now 1.4 -Coumadin was resumed 05/05/2020 but now will be held again today 05/08/2020 and will transition him to heparin  drip for now given that he will be going for percutaneous gastrostomy tube  Hyperlipidemia  -LDL was 84  -Continue Pravastatin 40 mg daily. -Continue the same dose upon discharge.  Paroxysmal A. fib  -CHA2DS2-VASc score above 5 continue Coumadin on discharge.   -INR became therapeutic at 2.5 but then patient had some hematuria so this was held and his heparin drip was stopped.  Coumadin had been resumed on 05/05/2020. Pharmacy adjusting the dose and Coumadin has been resumed but will be held again on 05/08/2020 as his last dose was  05/07/2020 given that he will be going for a percutaneous gastrostomy tube on 05/13/2019.  In the interim we will place him on a heparin drip given his elevated CHA2DS2-VASc score  Essential HTN -Currently on Carvedilol twice a day.  Off of Cleviprex drip.  Dysphagia and Nutrition  -Patient pulled out a Cortrak tube 04/2420 night.   -Patient had MBS done on 04/29/2020 with findings consistent with silent aspiration. -Cortrak tube was placed again on 05/02/2020.  Continue with nutrition through tube feedings and have increased free water flushes given his hyper per natremia -Continue with nutrition through tube feedings and they were held given his rhonchorous breath sounds -We will need to discuss PEG tube placement if the patient is not able to eat; -Speech therapy states that the patient failed miserably and was worse today than he did yesterday we will need to initiate discussions about PEG tube placement given that the Panda tube is not a permanent fix.  Patient's family is likely going to lean towards PEG tube placement but want to have a repeat SLP evaluation which I have ordered again.  If patient fails again we will likely pursue PEG tube on Monday and will continue core track tube feeding for now; SLP evaluated and recommending still n.p.o. so we will proceed with a PEG tube and have consulted interventional radiology for assistance with placement -I specifically spoke with the family and discussed with him that having a PEG tube will not prevent the patient from aspirating and they are in understanding and agreement  AKI on CKD stage II improving Metabolic Acidosis -His creatinine did trend up and initially they thought was prerenal but could be obstructive and I feel strongly that this is obstruction -IV fluids have now stopped -Renal work-up showing that his urinalysis was just pure blood and that his urine had many bacteria, greater than 50 RBCs, 21-50 WBCs with no squamous epithelial  cells.  Urine osmolality was 577, urine creatinine was 36.69, urine sodium level 45 -Urine cultures showing Klebsiella Oxytocoa and he is placed on antibiotics -Renal Ultrasound showed "Bladder is distended and contains debris. Bladder infection could present this fashion. Prevoid volume 951.9 CC. Mild prominence right renal pelvis noted. 2. Increased echogenicity both kidneys consistent with chronic medical renal disease." -I spoke with with Dr. Berneice HeinrichManny of Urology who recommended a CT of the abdomen and pelvis with and without contrast for baseline imaging given his hematuria and was concerned that his bladder prevoid volume was 951. -I discussed with nephrology Dr. Ronalee BeltsBhandari who feels that this is all obstruction and post renal and recommended Foley catheter placement which was done. -Patient's BUNs/creatinine is now trending back down and BUN/creatinine is now normalized with a BUN/creatinine 32/0.77 -If not improving with Foley catheter drainage and antibiotic coverage definitely will consult nephrology for further evaluation formally but he is now improving slowly  Acute Urinary Retention -Bladder ultrasound showed greater than 960 today again and renal  ultrasound as above  -Foley catheter was placed and he had immediate 1800 mL output -Continue with Foley catheter for now and drainage -May need to  add Tamsulosin unfortunately cannot be given through the NG tube -CT scan of the abdomen without pelvis done given his renal function worsening and showed some hydronephrosis of either kidney and no renal or ureteral calculi identified but there was fluid/inflammation of uncertain origin with a differential including pancreatitis and pyelonephritis but could have been also localizing simple ascites -See below; will likely leave Foley catheter in place for a while and urology recommending trial of void the day before or the day of discharge versus outpatient; will need to monitor closely -Dr. Tresa Moore has  also started the patient on finasteride and wanted to start the patient on tamsulosin but cannot because it cannot be put through his Cortrak  Hypernatremia, improved -Likely secondary to insensible losses as he has a Panda Tube -Edema level this morning was 150 and will increase his free water flushes with 175 mL every 4 hours every 2 hours and reevaluate.  We will continue 175 mL every every 2 for now because of his insensible losses -Repeat BMP showing that the sodium is starting to trend down and is normalized at 144  Normocytic Anemia -Patient's hemoglobin did drop slightly but is now trending back up  -Hemoglobin/hematocrit is stable at and actually improved and it is 11.8/35.9 -Urine is clearer -We will resume his anticoagulation with Coumadin today and continue to monitor for signs and symptoms of bleeding; currently no more overt bleeding noted -Repeat CBC in a.m.  Klebsiella Urinary Tract Infection -Urine was very dark yesterday and tested and is very turbid and brown-colored but did show many bacteria, more than 50 RBCs per high-power field, no squamous epithelial cells, and then 21-50 WBCs -Culture showing greater than 100,000 colony-forming units of Klebsiella Oxytocoa -Pete urinalysis done today showed a cloudy appearance with yellow color with large hemoglobin, large leukocytes, negative nitrites, many bacteria, 21-50 RBCs per high-power field, 21-50 WBCs -Empirically started IV ceftriaxone but may need to escalate. -Patient's WBC is trending upward went from 9.1 -> 12.3 -> 15.4 -> 13.2 -> 8.8 -> 10.7 today it is 9.9 yesterday and today is 11.4 -Blood cultures x2 1 out of 4 bottles with likely contaminant and repeat blood cultures done and showed no growth to date -Could potentially have pyelonephritis given the fluid/inflammation of unknown etiology that is ill-defined and centered around the pancreatic tail and just anterior to the left kidney; I doubt that this is pancreatitis  given that his lipase level was normal and this likely could be pyelonephritis and will have neurology weigh in further -Repeat CBC in a.m.  Gross Hematuria, improving after Foley catheter was placed -See above -Spoke with urology who recommends holding off Foley catheter now and obtain a CT of the abdomen pelvis for further baseline imaging and then review -His Coumadin was held yesterday and will continue to hold for now and if improves can likely resume tomorrow night -Discussed with urology Dr. Diona Fanti and he states that he will have Dr. Tresa Moore evaluate -Once urology comes by will have them evaluate the CT scan of the abdomen pelvis and weigh in on the fluid collection is ill-defined -Coumadin was resumed on 05/05/2020 given that his urine is clearer and will hold for now given that he will need to be off of oral anticoagulation for 5 days for his PEG tube placement.  We resumed a a heparin  drip and continue to monitor for signs and symptoms of hematuria  Hypokalemia -Improved -Patient potassium now 4.6 -Continue monitor if it is necessary -Repeat CMP in a.m.  Scrotal edema and ecchymosis -Elevation and scrotal ultrasound next-likely in setting of his anasarca -Continue with antibiotics -Doppler showed "Bilateral scrotal skin thickening. Right rete tubular ectasia. There is an intra testicular ill-defined heterogeneously hypoechoic area in the left lateral testicle measuring approximately 1.3 cmwith some internal blood flow. This has nonspecific imaging features. Differential considerations include unusual appearance of rete testis versus intra testicular mass. Presence of internal blood flow favors against testicular hematoma or segmental infarct. Recommend urologic consultation and potentially MRI for more detailed evaluation. There is also a subcentimeter left intra testicular cyst. Small  bilateral hydroceles.  Bilateral varicoceles. Bilateral epididymal head cysts."  Abnormal  LFTs Hyperbilirubinemia -Patient is AST is starting to trend up and went from 26 -> 58 -> 100 -> 64 -> 56 -Patient's ALT is trending up and went from 35 -> 60 -> 97 -> 90 -> 87 -Patient's T bili is also started trending up and went from 0.6 -> 0.8 -> 1.3 -> 1.1 -> 0.7 -We will obtain a right upper quadrant ultrasound and check an acute hepatitis panel in the a.m. -Continue to monitor and trend hepatic function carefully and repeat CMP in a.m.  DVT prophylaxis: Anticoagulated with Coumadin but will hold this and start a heparin drip in the interim given his elevated CHA2DS2-VASc score as he needs to be off of anticoagulation for PEG tube placement will be taking place on 05/13/2019 Code Status: FULL CODE Family Communication: No family present at bedside but will give the family a update Disposition Plan: Pending further clinical improvement and clearance by neurology; will need SNF level of care and she is medically stable  Status is: Inpatient  Remains inpatient appropriate because:Unsafe d/c plan, IV treatments appropriate due to intensity of illness or inability to take PO and Inpatient level of care appropriate due to severity of illness   Dispo: The patient is from: Home              Anticipated d/c is to: SNF              Anticipated d/c date is: 3 days              Patient currently is not medically stable to d/c.  Consultants:   Vascular Surgery  Neurology  Palliative Care Medicine  Discussed the case with nephrology Dr. Ronalee BeltsBhandari  Urology Dr. Dahlstedt/Dr. Berneice HeinrichManny   Procedures:  Status post left transcarotid artery stenting for symptomatic disease complicated by stroke  Antimicrobials:  Anti-infectives (From admission, onward)   Start     Dose/Rate Route Frequency Ordered Stop   05/04/20 0945  azithromycin (ZITHROMAX) 500 mg in sodium chloride 0.9 % 250 mL IVPB  Status:  Discontinued        500 mg 250 mL/hr over 60 Minutes Intravenous Every 24 hours 05/04/20 0859  05/05/20 0956   05/04/20 0900  cefTRIAXone (ROCEPHIN) 1 g in sodium chloride 0.9 % 100 mL IVPB        1 g 200 mL/hr over 30 Minutes Intravenous Every 24 hours 05/04/20 0803     04/27/20 0600  ceFAZolin (ANCEF) IVPB 2g/100 mL premix  Status:  Discontinued       Note to Pharmacy: Send with pt to OR   2 g 200 mL/hr over 30 Minutes Intravenous On call 04/26/20 0805 04/26/20 2018  04/27/20 0600  ceFAZolin (ANCEF) IVPB 2g/100 mL premix        2 g 200 mL/hr over 30 Minutes Intravenous 30 min pre-op 04/26/20 2013 04/27/20 0804   04/27/20 0000  ceFAZolin (ANCEF) IVPB 1 g/50 mL premix  Status:  Discontinued       Note to Pharmacy: [place] with pt to OR   1 g 100 mL/hr over 30 Minutes Intravenous On call 04/26/20 0800 04/26/20 0805        Subjective: Seen and examined at bedside and he is more awake and about to work with therapy.  He had been coughing quite a bit this is a dry nonproductive cough.  He started coughing after he was given ice chips yesterday.  Will need speech to reevaluate him.  Repeat chest x-ray showed minimal atelectasis.  We will try antitussives and the plan is for him to go for a PEG tube placement on 05/12/2020.  No other concerns or complaints at this time.  Objective: Vitals:   05/09/20 0500 05/09/20 0512 05/09/20 0733 05/09/20 1205  BP:  108/73 105/65 105/87  Pulse:  100 92 (!) 107  Resp:  18 (!) 22 18  Temp:  98 F (36.7 C) 97.6 F (36.4 C) 98.4 F (36.9 C)  TempSrc:  Oral Oral Oral  SpO2:  93% 96% 95%  Weight: 81.5 kg     Height:        Intake/Output Summary (Last 24 hours) at 05/09/2020 1433 Last data filed at 05/09/2020 [id number] Gross per 24 hour  Intake 4310 ml  Output 1200 ml  Net 3110 ml   Filed Weights   05/07/20 0411 05/08/20 0402 05/09/20 0500  Weight: 86.8 kg 83.1 kg 81.5 kg   Examination: Physical Exam:  Constitutional: She is an ill-appearing Caucasian male currently in no acute distress but he is continuing to cough somewhat.  He is awake today  and about to work with therapy. Eyes: Lids and conjunctive are normal.  Skin anicteric. ENMT: External Ears, Nose appear normal. Grossly normal hearing.  Has a Cortrak tube in his left nare Neck: Appears normal, supple, no cervical masses, normal ROM, no appreciable thyromegaly Respiratory: Slight diminished to auscultation bilaterally with coarse breath sounds, no wheezing, rales, rhonchi or crackles. Normal respiratory effort and patient is not tachypenic. No accessory muscle use.  Unlabored breathing but is wearing supplemental oxygen via nasal cannula Cardiovascular: RRR, no murmurs / rubs / gallops. S1 and S2 auscultated.  Minimal extremity edema Abdomen: Soft, non-tender, non-distended. Bowel sounds positive.  GU: Deferred.  Foley catheter is in place Musculoskeletal: No clubbing / cyanosis of digits/nails. No joint deformity upper and lower extremities.  Skin: No rashes, lesions, ulcers on limited skin evaluation. No induration; Warm and dry.  Neurologic: He remains aphasic and does have some hemiplegia and right arm flaccidity.  Continues to weakness in his right lower extremity. Psychiatric: Normal judgment and insight.  He is more awake and alert today than he was yesterday.. Normal mood and appropriate affect.   Data Reviewed: I have personally reviewed following labs and imaging studies  CBC: Recent Labs  Lab 05/05/20 0302 05/06/20 0400 05/07/20 0207 05/08/20 0350 05/09/20 0957  WBC 13.2* 8.8 10.7* 9.9 11.4*  NEUTROABS 10.7* 6.3 8.1* 7.4 8.6*  HGB 10.9* 10.6* 11.3* 11.0* 11.8*  HCT 33.6* 31.9* 35.6* 33.8* 35.9*  MCV 92.1 91.9 93.2 93.6 92.5  PLT 294 299 392 359 0000000   Basic Metabolic Panel: Recent Labs  Lab 05/05/20 0302 05/06/20 0400  05/06/20 1523 05/07/20 0207 05/08/20 0350 05/09/20 0957  NA 145 150* 147* 146* 144 136  K 3.9 4.1 4.5 4.7 4.5 4.6  CL 112* 115* 112* 111 108 102  CO2 23 26 24 25 26 23   GLUCOSE 144* 132* 132* 114* 132* 121*  BUN 55* 48* 40* 38* 36*  32*  CREATININE 1.81* 1.24 1.11 0.97 0.99 0.77  CALCIUM 8.2* 8.4* 8.5* 8.5* 8.5* 8.3*  MG 1.9 1.8 2.3 2.1 2.0 2.0  PHOS 3.9 4.6  --  4.1 3.6 3.3   GFR: Estimated Creatinine Clearance: 83.5 mL/min (by C-G formula based on SCr of 0.77 mg/dL). Liver Function Tests: Recent Labs  Lab 05/05/20 0302 05/06/20 0400 05/07/20 0207 05/08/20 0350 05/09/20 0957  AST 26 58* 100* 64* 56*  ALT 35 60* 97* 90* 87*  ALKPHOS 31* 32* 37* 37* 40  BILITOT 0.6 0.8 1.3* 1.1 0.7  PROT 5.4* 5.2* 5.6* 5.8* 5.8*  ALBUMIN 2.5* 2.3* 2.4* 2.8* 2.7*   Recent Labs  Lab 05/04/20 1326  LIPASE 49   No results for input(s): AMMONIA in the last 168 hours. Coagulation Profile: Recent Labs  Lab 05/04/20 0225 05/05/20 0302 05/06/20 0400 05/07/20 0207 05/08/20 0350  INR 1.8* 1.6* 1.5* 1.5* 1.4*   Cardiac Enzymes: Recent Labs  Lab 05/03/20 0430  CKTOTAL 124   BNP (last 3 results) No results for input(s): PROBNP in the last 8760 hours. HbA1C: No results for input(s): HGBA1C in the last 72 hours. CBG: Recent Labs  Lab 05/08/20 2038 05/08/20 2322 05/09/20 0514 05/09/20 0712 05/09/20 1123  GLUCAP 146* 120* 127* 119* 128*   Lipid Profile: No results for input(s): CHOL, HDL, LDLCALC, TRIG, CHOLHDL, LDLDIRECT in the last 72 hours. Thyroid Function Tests: No results for input(s): TSH, T4TOTAL, FREET4, T3FREE, THYROIDAB in the last 72 hours. Anemia Panel: No results for input(s): VITAMINB12, FOLATE, FERRITIN, TIBC, IRON, RETICCTPCT in the last 72 hours. Sepsis Labs: No results for input(s): PROCALCITON, LATICACIDVEN in the last 168 hours.  Recent Results (from the past 240 hour(s))  Culture, Urine     Status: Abnormal   Collection Time: 05/03/20 10:42 AM   Specimen: Urine, Random  Result Value Ref Range Status   Specimen Description URINE, RANDOM  Final   Special Requests   Final    NONE Performed at [facility], [address], [place], [place] [address]    Culture >=100,000  COLONIES/mL KLEBSIELLA OXYTOCA (A)  Final   Report Status 05/05/2020 FINAL  Final   Organism ID, Bacteria KLEBSIELLA OXYTOCA (A)  Final      Susceptibility   Klebsiella oxytoca - MIC*    AMPICILLIN RESISTANT Resistant     CEFAZOLIN 8 SENSITIVE Sensitive     CEFEPIME <=0.12 SENSITIVE Sensitive     CEFTRIAXONE <=0.25 SENSITIVE Sensitive     CIPROFLOXACIN <=0.25 SENSITIVE Sensitive     GENTAMICIN <=1 SENSITIVE Sensitive     IMIPENEM <=0.25 SENSITIVE Sensitive     NITROFURANTOIN <=16 SENSITIVE Sensitive     TRIMETH/SULFA <=20 SENSITIVE Sensitive     AMPICILLIN/SULBACTAM 4 SENSITIVE Sensitive     PIP/TAZO <=4 SENSITIVE Sensitive     * >=100,000 COLONIES/mL KLEBSIELLA OXYTOCA  Culture, blood (routine x 2)     Status: Abnormal   Collection Time: 05/04/20  4:46 PM   Specimen: BLOOD  Result Value Ref Range Status   Specimen Description BLOOD BLOOD LEFT FOREARM  Final   Special Requests   Final    BOTTLES DRAWN AEROBIC ONLY Blood  Culture adequate volume   Culture  Setup Time   Final    AEROBIC BOTTLE ONLY GRAM POSITIVE COCCI Organism ID to follow CRITICAL RESULT CALLED TO, READ BACK BY AND VERIFIED WITH: J LEDFORD [facility] 05/06/20 0615 JDW    Culture (A)  Final    STAPHYLOCOCCUS EPIDERMIDIS THE SIGNIFICANCE OF ISOLATING THIS ORGANISM FROM A SINGLE SET OF BLOOD CULTURES WHEN MULTIPLE SETS ARE DRAWN IS UNCERTAIN. PLEASE NOTIFY THE MICROBIOLOGY DEPARTMENT WITHIN ONE WEEK IF SPECIATION AND SENSITIVITIES ARE REQUIRED. Performed at [facility], [place] [address] [place], [place] [address]    Report Status 05/07/2020 FINAL  Final  Blood Culture ID Panel (Reflexed)     Status: Abnormal   Collection Time: 05/04/20  4:46 PM  Result Value Ref Range Status   Enterococcus faecalis NOT DETECTED NOT DETECTED Final   Enterococcus Faecium NOT DETECTED NOT DETECTED Final   Listeria monocytogenes NOT DETECTED NOT DETECTED Final   Staphylococcus species DETECTED (A) NOT DETECTED Final    Comment:  CRITICAL RESULT CALLED TO, READ BACK BY AND VERIFIED WITH: J LEDFORD [facility] 05/06/20 0615 JDW    Staphylococcus aureus (BCID) NOT DETECTED NOT DETECTED Final   Staphylococcus epidermidis DETECTED (A) NOT DETECTED Final    Comment: Methicillin (oxacillin) resistant coagulase negative staphylococcus. Possible blood culture contaminant (unless isolated from more than one blood culture draw or clinical case suggests pathogenicity). No antibiotic treatment is indicated for blood  culture contaminants. CRITICAL RESULT CALLED TO, READ BACK BY AND VERIFIED WITH: J LEDFORD [facility] 05/06/20 0615 JDW    Staphylococcus lugdunensis NOT DETECTED NOT DETECTED Final   Streptococcus species NOT DETECTED NOT DETECTED Final   Streptococcus agalactiae NOT DETECTED NOT DETECTED Final   Streptococcus pneumoniae NOT DETECTED NOT DETECTED Final   Streptococcus pyogenes NOT DETECTED NOT DETECTED Final   A.calcoaceticus-baumannii NOT DETECTED NOT DETECTED Final   Bacteroides fragilis NOT DETECTED NOT DETECTED Final   Enterobacterales NOT DETECTED NOT DETECTED Final   Enterobacter cloacae complex NOT DETECTED NOT DETECTED Final   Escherichia coli NOT DETECTED NOT DETECTED Final   Klebsiella aerogenes NOT DETECTED NOT DETECTED Final   Klebsiella oxytoca NOT DETECTED NOT DETECTED Final   Klebsiella pneumoniae NOT DETECTED NOT DETECTED Final   Proteus species NOT DETECTED NOT DETECTED Final   Salmonella species NOT DETECTED NOT DETECTED Final   Serratia marcescens NOT DETECTED NOT DETECTED Final   Haemophilus influenzae NOT DETECTED NOT DETECTED Final   Neisseria meningitidis NOT DETECTED NOT DETECTED Final   Pseudomonas aeruginosa NOT DETECTED NOT DETECTED Final   Stenotrophomonas maltophilia NOT DETECTED NOT DETECTED Final   Candida albicans NOT DETECTED NOT DETECTED Final   Candida auris NOT DETECTED NOT DETECTED Final   Candida glabrata NOT DETECTED NOT DETECTED Final   Candida krusei NOT DETECTED NOT DETECTED  Final   Candida parapsilosis NOT DETECTED NOT DETECTED Final   Candida tropicalis NOT DETECTED NOT DETECTED Final   Cryptococcus neoformans/gattii NOT DETECTED NOT DETECTED Final   Methicillin resistance mecA/C DETECTED (A) NOT DETECTED Final    Comment: CRITICAL RESULT CALLED TO, READ BACK BY AND VERIFIED WITHLenna Sciara [facility] 05/06/20 0615 JDW Performed at [facility], [address], [place], [place] [address]   Culture, blood (routine x 2)     Status: None   Collection Time: 05/04/20  4:52 PM   Specimen: BLOOD LEFT HAND  Result Value Ref Range Status   Specimen Description BLOOD LEFT HAND  Final   Special Requests  Final    BOTTLES DRAWN AEROBIC ONLY Blood Culture adequate volume   Culture   Final    NO GROWTH 5 DAYS Performed at [facility], [place] [address], [place], [place] [address]    Report Status 05/09/2020 FINAL  Final  Culture, blood (routine x 2)     Status: None (Preliminary result)   Collection Time: 05/06/20  9:00 AM   Specimen: BLOOD  Result Value Ref Range Status   Specimen Description BLOOD RIGHT ANTECUBITAL  Final   Special Requests   Final    BOTTLES DRAWN AEROBIC AND ANAEROBIC Blood Culture adequate volume   Culture   Final    NO GROWTH 3 DAYS Performed at [facility], [place] [address] [place], [place] [address]    Report Status PENDING  Incomplete  Culture, blood (routine x 2)     Status: None (Preliminary result)   Collection Time: 05/06/20  9:03 AM   Specimen: BLOOD RIGHT ARM  Result Value Ref Range Status   Specimen Description BLOOD RIGHT ARM  Final   Special Requests   Final    BOTTLES DRAWN AEROBIC AND ANAEROBIC Blood Culture adequate volume   Culture   Final    NO GROWTH 3 DAYS Performed at [facility], [place] [address], [place], [place] [address]    Report Status PENDING  Incomplete    RN Pressure Injury Documentation:     Estimated body mass index is 23.07 kg/m as calculated from the following:   Height as of  this encounter: 6\' 2"  (1.88 m).   Weight as of this encounter: 81.5 kg.  Malnutrition Type:  Nutrition Problem: Inadequate oral intake Etiology: dysphagia   Malnutrition Characteristics:  Signs/Symptoms: NPO status   Nutrition Interventions:  Interventions: Tube feeding   Radiology Studies: DG CHEST PORT 1 VIEW  Result Date: 05/09/2020 CLINICAL DATA:  Shortness of breath, cough EXAM: PORTABLE CHEST 1 VIEW COMPARISON:  Portable exam 0929 hours compared to 05/08/2020 FINDINGS: Feeding tube extends into stomach. Normal heart size, mediastinal contours, and pulmonary vascularity. No definite acute infiltrate, pleural effusion or pneumothorax. Minimal bibasilar atelectasis noted. Osseous structures unremarkable. IMPRESSION: Minimal bibasilar atelectasis. Electronically Signed   By: Lavonia Dana M.D.   On: 05/09/2020 09:39   DG CHEST PORT 1 VIEW  Result Date: 05/08/2020 CLINICAL DATA:  Cough. EXAM: PORTABLE CHEST 1 VIEW COMPARISON:  May 05, 2020. FINDINGS: The heart size and mediastinal contours are within normal limits. Both lungs are clear. The visualized skeletal structures are unremarkable. IMPRESSION: No active disease. Electronically Signed   By: Marijo Conception M.D.   On: 05/08/2020 16:21   US Abdomen Limited RUQ (LIVER/GB)  Result Date: 05/07/2020 CLINICAL DATA:  Abnormal liver function tests. EXAM: ULTRASOUND ABDOMEN LIMITED RIGHT UPPER QUADRANT COMPARISON:  May 04, 2020. FINDINGS: Gallbladder: No gallstones or wall thickening visualized. No sonographic Murphy sign noted by sonographer. Common bile duct: Diameter: 3 mm which is within normal limits. Liver: No focal lesion identified. Within normal limits in parenchymal echogenicity. Portal vein is patent on color Doppler imaging with normal direction of blood flow towards the liver. Other: None. IMPRESSION: No definite abnormality seen in the right upper quadrant of the abdomen. Electronically Signed   By: Marijo Conception M.D.    On: 05/07/2020 18:04   Scheduled Meds: . aspirin  81 mg Per Tube Daily  . carvedilol  25 mg Per Tube BID WC  . Chlorhexidine Gluconate Cloth  6 each Topical  Daily  . clopidogrel  75 mg Per Tube Daily  . feeding supplement (PROSource TF)  45 mL Per Tube BID  . finasteride  5 mg Oral Daily  . free water  175 mL Per Tube Q4H  . insulin aspart  0-9 Units Subcutaneous Q4H  . pravastatin  40 mg Per Tube Daily   Continuous Infusions: . cefTRIAXone (ROCEPHIN)  IV 1 g (05/09/20 0853)  . feeding supplement (JEVITY 1.5 CAL/FIBER) 1,000 mL (05/07/20 2150)  . heparin 1,400 Units/hr (05/09/20 1119)    LOS: 18 days   Merlene Laughter, DO [facility] PAGER is on AMION  If 7PM-7AM, please contact night-coverage [email or web address]

## 2020-05-09 NOTE — Progress Notes (Signed)
ANTICOAGULATION CONSULT NOTE  Pharmacy Consult for Warfarin >> heparin Indication: on PTA warfarin for hx of DVT/PE, new acute stroke, new Afib  Patient Measurements: Height: 6\' 2"  (188 cm) Weight: 81.5 kg (179 lb 10.8 oz) IBW/kg (Calculated) : 82.2  Heparin weight: 83.1  Vital Signs: Temp: 98.3 F (36.8 C) (01/03 2043) Temp Source: Oral (01/03 2043) BP: 110/69 (01/03 2043) Pulse Rate: 79 (01/03 2043)  Labs: Recent Labs    05/07/20 0207 05/08/20 0350 05/08/20 2154 05/09/20 0957 05/09/20 2012  HGB 11.3* 11.0*  --  11.8*  --   HCT 35.6* 33.8*  --  35.9*  --   PLT 392 359  --  392  --   LABPROT 17.9* 16.9*  --   --   --   INR 1.5* 1.4*  --   --   --   HEPARINUNFRC  --   --  <0.10* 0.18* 0.33  CREATININE 0.97 0.99  --  0.77  --     Estimated Creatinine Clearance: 83.5 mL/min (by C-G formula based on SCr of 0.77 mg/dL).  Assessment: 80 yo male with L symptomatic carotid stenosis.  S/p L carotid TCAR procedure w/ stenting that was complicated by postprocedure stroke.  MRI brain shows patchy L basal ganglia and L frontoparietal cortical and subcortical infarcts (new). No hemorrhagic transformation. New stroke and new onset atrial fibrillation. Previous reported hematuria but appears to have resolved.  PTA warfarin dose: 2.5mg  daily except 1.25mg  on Mondays.  1/2: IR consulted for PEG tube placement due to multiple failed swallow study tests.  Procedure planned for Thursday, 1/6 to allow adequate wash-out from warfarin.  Warfarin will be HELD starting today. Last dose of warfarin 05/07/20.  Pharmacy consulted to initiate heparin drip while warfarin held.   1/3: Heparin level now therapeutic on 1400 units/hr.  Hgb stable.  Next heparin level with AM labs.  Goal of Therapy:   Heparin level 0.3-0.5 units/ml (more narrow goal due to recent stroke) INR goal 2-3  Monitor platelets by anticoagulation protocol: Yes    Plan: -Continue heparin at 1400 units/hr -Next Heparin level  with AM labs  -Daily heparin level and CBC   Won Kreuzer, Pharm.D., BCPS Clinical Pharmacist  **Pharmacist phone directory can be found on [email or web address] listed under [facility].  05/09/2020 9:44 PM

## 2020-05-09 NOTE — Progress Notes (Signed)
ANTICOAGULATION CONSULT NOTE  Pharmacy Consult for Warfarin >> heparin Indication: on PTA warfarin for hx of DVT/PE, new acute stroke, new Afib  Patient Measurements: Height: 6\' 2"  (188 cm) Weight: 81.5 kg (179 lb 10.8 oz) IBW/kg (Calculated) : 82.2  Heparin weight: 83.1  Vital Signs: Temp: 97.6 F (36.4 C) (01/03 0733) Temp Source: Oral (01/03 0733) BP: 105/65 (01/03 0733) Pulse Rate: 92 (01/03 0733)  Labs: Recent Labs    05/06/20 1523 05/07/20 0207 05/07/20 0207 05/08/20 0350 05/08/20 2154 05/09/20 0957  HGB  --  11.3*   < > 11.0*  --  11.8*  HCT  --  35.6*  --  33.8*  --  35.9*  PLT  --  392  --  359  --  392  LABPROT  --  17.9*  --  16.9*  --   --   INR  --  1.5*  --  1.4*  --   --   HEPARINUNFRC  --   --   --   --  <0.10* 0.18*  CREATININE 1.11 0.97  --  0.99  --   --    < > = values in this interval not displayed.    Estimated Creatinine Clearance: 67.5 mL/min (by C-G formula based on SCr of 0.99 mg/dL).  Assessment: 80 yo male with L symptomatic carotid stenosis.  S/p L carotid TCAR procedure w/ stenting that was complicated by postprocedure stroke.  MRI brain shows patchy L basal ganglia and L frontoparietal cortical and subcortical infarcts (new).  No hemorrhagic transformation. New stroke and new onset atrial fibrillation. Previous reported hematuria but appears to have resolved.  PTA warfarin dose: 2.5mg  daily except 1.25mg  on Mondays.  1/2: IR consulted for PEG tube placement due to multiple failed swallow study tests.  Procedure planned for Thursday, 1/6 to allow adequate wash-out from warfarin.  Warfarin will be HELD starting today. Last dose of warfarin 05/07/20.  Pharmacy consulted to initiate heparin drip while warfarin held.   Heparin level came back subtherapeutic again today. We will increase the rate and recheck level.  Hgb stable Goal of Therapy:   Heparin level 0.3-0.5 units/ml (more narrow goal due to recent stroke) INR goal 2-3  Monitor  platelets by anticoagulation protocol: Yes    Plan: -Increase heparin to 1400 units/hr -Heparin level in 8 hours and daily wth CBC daily  07/05/20, PharmD, [place], AAHIVP, CPP Infectious Disease Pharmacist 05/09/2020 11:11 AM

## 2020-05-09 NOTE — TOC Progression Note (Signed)
Transition of Care [facility]) - Progression Note    Patient Details  Name: John Lambert MRN: [id number] Date of Birth: 06-14-1940  [facility]) CM/SW Contact  Mearl Latin, LCSW Phone Number: 05/09/2020, 1:34 PM  Clinical Narrative:    CSW received call from patient's daughter. She is requesting [facility] (she is touring today). Covering CSW will send referral out once PEG is placed.    Expected Discharge Plan: Skilled Nursing Facility Barriers to Discharge: Insurance Authorization,Continued Medical Work up,SNF Pending bed offer  Expected Discharge Plan and Services Expected Discharge Plan: Skilled Nursing Facility In-house Referral: Clinical Social Work Discharge Planning Services: CM Consult Post Acute Care Choice: Skilled Nursing Facility Living arrangements for the past 2 months: Single Family Home                                       Social Determinants of Health (SDOH) Interventions    Readmission Risk Interventions No flowsheet data found.

## 2020-05-09 NOTE — Progress Notes (Signed)
Physical Therapy Treatment Patient Details Name: John Lambert MRN: [id number] DOB: Sep 17, 1940 Today's Date: 05/09/2020    History of Present Illness John Lambert is a 80 y.o. male admitted 12/14 with bil LE weakness and difficulty walking as well as left hip pain. Imaging notable for Numerous scattered punctate acute infarctions in the left  posterior frontal region consistent with micro embolic infarctions. Punctate acute infarction also affecting the splenium of  the corpus callosum.  Pt with L carotid artery stenosis s/p stent on 04/27/20. Post procedure pt with right hemiparesis, aphasia and left gaze with intraprocedural stroke. Code stroke called with CT head (-) for acute findings. Emergent L carotid diagnostic cerebral cath angiogram (-) for acute findings. MRI 12/23 (+) patchy left basal ganglia as well as left frontoparietal cortical and subcortical infarcts. 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    PT Comments    Pt was coughing a lot and very flat. Pt not eager to get up but did participate with PT to transfer to the EOB. Pt requiring maxAx2 for transfers and is dependent to maintain EOB balance. Pt with strong L pushing to the R and unable to self correct. Pt with R neglect and impaired proprioception. Continue to recommend ST-SNF upon d/c to allow for increased time for maximal functional recovery for safe transition home. Acute PT to cont to follow.   Follow Up Recommendations  SNF;Supervision/Assistance - 24 hour     Equipment Recommendations  Wheelchair (measurements PT);Wheelchair cushion (measurements PT);Hospital bed    Recommendations for Other Services Rehab consult     Precautions / Restrictions Precautions Precautions: Fall Precaution Comments: cortrak, R hemi Restrictions Weight Bearing Restrictions: No    Mobility  Bed Mobility Overal bed mobility: Needs Assistance Bed Mobility: Supine to  Sit;Sit to Supine     Supine to sit: Max assist;+2 for physical assistance;HOB elevated Sit to supine: Max assist;+2 for safety/equipment   General bed mobility comments: with max directional verbal cues pt initiated L LE towards EOB and attempted to use L UE, pt unable to use R UE functionally, pt pushing with L UE to the right while sitting EOB  Transfers                 General transfer comment: deferred this date due to pt lethargy, fatigue, and declining to attempt. pt with significant R lateral/posterior lean  Ambulation/Gait                 Stairs             Wheelchair Mobility    Modified Rankin (Stroke Patients Only) Modified Rankin (Stroke Patients Only) Pre-Morbid Rankin Score: No significant disability Modified Rankin: Severe disability     Balance Overall balance assessment: Needs assistance Sitting-balance support: Bilateral upper extremity supported;Feet supported Sitting balance-Leahy Scale: Poor Sitting balance - Comments: pt pushing with L UE towards the R. Pt unable to self correct despite max verbal and tactile cues, pt requiring maxA posteriorly to maitnain EOB balance Postural control: Posterior lean;Right lateral lean                                  Cognition Arousal/Alertness: Lethargic Behavior During Therapy: Flat affect Overall Cognitive Status: Impaired/Different from baseline Area of Impairment: Attention;Following commands;Problem solving  Current Attention Level: Alternating Memory: Decreased short-term memory Following Commands: Follows one step commands with increased time Safety/Judgement: Decreased awareness of deficits (R inattention) Awareness: Emergent Problem Solving: Slow processing;Decreased initiation;Requires verbal cues;Requires tactile cues General Comments: pt very flat, coughing, pt inititally shook head no to trying to get up, pt able to shake head yes/no  appropriately to questions asked, reports difficulty with word finding therefore he doesn't talk      Exercises Other Exercises Other Exercises: passive R UE/LE PROM due to flaccidity Other Exercises: attempted bilat LAQ at EOB however pt quickly fatigued with the L, pt did demo trace movement in R LE    General Comments General comments (skin integrity, edema, etc.): VSS      Pertinent Vitals/Pain Pain Assessment: Faces Faces Pain Scale: No hurt    Home Living                      Prior Function            PT Goals (current goals can now be found in the care plan section) Progress towards PT goals: Not progressing toward goals - comment    Frequency    Min 3X/week      PT Plan Current plan remains appropriate    Co-evaluation              AM-PAC PT "6 Clicks" Mobility   Outcome Measure  Help needed turning from your back to your side while in a flat bed without using bedrails?: A Lot Help needed moving from lying on your back to sitting on the side of a flat bed without using bedrails?: A Lot Help needed moving to and from a bed to a chair (including a wheelchair)?: Total Help needed standing up from a chair using your arms (e.g., wheelchair or bedside chair)?: Total Help needed to walk in hospital room?: Total Help needed climbing 3-5 steps with a railing? : Total 6 Click Score: 8    End of Session   Activity Tolerance: Patient limited by fatigue Patient left: in bed;with call bell/phone within reach;with bed alarm set Nurse Communication: Mobility status;Other (comment) (pt had BM, pt unaware) PT Visit Diagnosis: Muscle weakness (generalized) (M62.81);History of falling (Z91.81);Unsteadiness on feet (R26.81);Other abnormalities of gait and mobility (R26.89);Difficulty in walking, not elsewhere classified (R26.2);Other symptoms and signs involving the nervous system (R29.898);Hemiplegia and hemiparesis Hemiplegia - Right/Left: Right Hemiplegia -  caused by: Cerebral infarction     Time: 0902-0923 PT Time Calculation (min) (ACUTE ONLY): 21 min  Charges:  $Neuromuscular Re-education: 8-22 mins                     Kittie Plater, PT, DPT Acute Rehabilitation Services Pager #: [phone] Office #: [phone]    Berline Lopes 05/09/2020, 10:27 AM

## 2020-05-09 NOTE — Plan of Care (Signed)
  Problem: Education: Goal: Knowledge of disease or condition will improve Outcome: Progressing Goal: Knowledge of secondary prevention will improve Outcome: Progressing Goal: Knowledge of patient specific risk factors addressed and post discharge goals established will improve Outcome: Progressing Goal: Individualized Educational Video(s) Outcome: Progressing   Problem: Coping: Goal: Will verbalize positive feelings about self Outcome: Progressing Goal: Will identify appropriate support needs Outcome: Progressing   Problem: Health Behavior/Discharge Planning: Goal: Ability to manage health-related needs will improve Outcome: Progressing   Problem: Self-Care: Goal: Verbalization of feelings and concerns over difficulty with self-care will improve Outcome: Progressing   Problem: Self-Care: Goal: Verbalization of feelings and concerns over difficulty with self-care will improve Outcome: Progressing   Problem: Nutrition: Goal: Risk of aspiration will decrease Outcome: Progressing Goal: Dietary intake will improve Outcome: Progressing   Problem: Ischemic Stroke/TIA Tissue Perfusion: Goal: Complications of ischemic stroke/TIA will be minimized Outcome: Progressing

## 2020-05-09 NOTE — Plan of Care (Signed)
  Problem: Clinical Measurements: Goal: Diagnostic test results will improve Outcome: Progressing Goal: Respiratory complications will improve Outcome: Progressing   

## 2020-05-10 DIAGNOSIS — I63411 Cerebral infarction due to embolism of right middle cerebral artery: Secondary | ICD-10-CM

## 2020-05-10 DIAGNOSIS — I63132 Cerebral infarction due to embolism of left carotid artery: Secondary | ICD-10-CM | POA: Diagnosis not present

## 2020-05-10 DIAGNOSIS — I63419 Cerebral infarction due to embolism of unspecified middle cerebral artery: Secondary | ICD-10-CM

## 2020-05-10 DIAGNOSIS — R531 Weakness: Secondary | ICD-10-CM | POA: Diagnosis not present

## 2020-05-10 DIAGNOSIS — Z7901 Long term (current) use of anticoagulants: Secondary | ICD-10-CM | POA: Diagnosis not present

## 2020-05-10 DIAGNOSIS — D689 Coagulation defect, unspecified: Secondary | ICD-10-CM | POA: Diagnosis not present

## 2020-05-10 LAB — COMPREHENSIVE METABOLIC PANEL
ALT: 79 U/L — ABNORMAL HIGH (ref 0–44)
AST: 47 U/L — ABNORMAL HIGH (ref 15–41)
Albumin: 2.7 g/dL — ABNORMAL LOW (ref 3.5–5.0)
Alkaline Phosphatase: 41 U/L (ref 38–126)
Anion gap: 9 (ref 5–15)
BUN: 31 mg/dL — ABNORMAL HIGH (ref 8–23)
CO2: 25 mmol/L (ref 22–32)
Calcium: 8.2 mg/dL — ABNORMAL LOW (ref 8.9–10.3)
Chloride: 102 mmol/L (ref 98–111)
Creatinine, Ser: 0.8 mg/dL (ref 0.61–1.24)
GFR, Estimated: >60 mL/min (ref >60)
Glucose, Bld: 119 mg/dL — ABNORMAL HIGH (ref 70–99)
Potassium: 4.5 mmol/L (ref 3.5–5.1)
Sodium: 136 mmol/L (ref 135–145)
Total Bilirubin: 0.7 mg/dL (ref 0.3–1.2)
Total Protein: 5.7 g/dL — ABNORMAL LOW (ref 6.5–8.1)

## 2020-05-10 LAB — CBC WITH DIFFERENTIAL/PLATELET
Abs Immature Granulocytes: 0.61 10*3/uL — ABNORMAL HIGH (ref 0.00–0.07)
Basophils Absolute: 0.1 10*3/uL (ref 0.0–0.1)
Basophils Relative: 1 %
Eosinophils Absolute: 0.5 10*3/uL (ref 0.0–0.5)
Eosinophils Relative: 4 %
HCT: 34.8 % — ABNORMAL LOW (ref 39.0–52.0)
Hemoglobin: 11.6 g/dL — ABNORMAL LOW (ref 13.0–17.0)
Immature Granulocytes: 5 %
Lymphocytes Relative: 10 %
Lymphs Abs: 1.2 10*3/uL (ref 0.7–4.0)
MCH: 30.7 pg (ref 26.0–34.0)
MCHC: 33.3 g/dL (ref 30.0–36.0)
MCV: 92.1 fL (ref 80.0–100.0)
Monocytes Absolute: 0.9 10*3/uL (ref 0.1–1.0)
Monocytes Relative: 7 %
Neutro Abs: 9.4 10*3/uL — ABNORMAL HIGH (ref 1.7–7.7)
Neutrophils Relative %: 73 %
Platelets: 448 10*3/uL — ABNORMAL HIGH (ref 150–400)
RBC: 3.78 MIL/uL — ABNORMAL LOW (ref 4.22–5.81)
RDW: 13.5 % (ref 11.5–15.5)
WBC: 12.7 10*3/uL — ABNORMAL HIGH (ref 4.0–10.5)
nRBC: 0 % (ref 0.0–0.2)

## 2020-05-10 LAB — MAGNESIUM: Magnesium: 2 mg/dL (ref 1.7–2.4)

## 2020-05-10 LAB — GLUCOSE, CAPILLARY
Glucose-Capillary: 102 mg/dL — ABNORMAL HIGH (ref 70–99)
Glucose-Capillary: 108 mg/dL — ABNORMAL HIGH (ref 70–99)
Glucose-Capillary: 116 mg/dL — ABNORMAL HIGH (ref 70–99)
Glucose-Capillary: 118 mg/dL — ABNORMAL HIGH (ref 70–99)
Glucose-Capillary: 125 mg/dL — ABNORMAL HIGH (ref 70–99)
Glucose-Capillary: 136 mg/dL — ABNORMAL HIGH (ref 70–99)

## 2020-05-10 LAB — HEPARIN LEVEL (UNFRACTIONATED)
Heparin Unfractionated: 0.22 IU/mL — ABNORMAL LOW (ref 0.30–0.70)
Heparin Unfractionated: 0.24 IU/mL — ABNORMAL LOW (ref 0.30–0.70)

## 2020-05-10 LAB — PHOSPHORUS: Phosphorus: 3.3 mg/dL (ref 2.5–4.6)

## 2020-05-10 MED ORDER — CARVEDILOL 12.5 MG PO TABS
12.5000 mg | ORAL_TABLET | Freq: Two times a day (BID) | ORAL | Status: DC
  Administered 2020-05-11 – 2020-05-20 (×19): 12.5 mg
  Filled 2020-05-10 (×21): qty 1

## 2020-05-10 MED ORDER — FREE WATER
175.0000 mL | Freq: Four times a day (QID) | Status: DC
  Administered 2020-05-10 – 2020-05-11 (×6): 175 mL

## 2020-05-10 MED ORDER — SODIUM CHLORIDE 0.9 % IV BOLUS
500.0000 mL | Freq: Once | INTRAVENOUS | Status: AC
  Administered 2020-05-10: 500 mL via INTRAVENOUS

## 2020-05-10 MED ORDER — CEPHALEXIN 250 MG/5ML PO SUSR
500.0000 mg | Freq: Three times a day (TID) | ORAL | Status: AC
  Administered 2020-05-11 – 2020-05-13 (×9): 500 mg
  Filled 2020-05-10 (×11): qty 10

## 2020-05-10 NOTE — Progress Notes (Addendum)
ANTICOAGULATION CONSULT NOTE  Pharmacy Consult for Warfarin >> heparin Indication: on PTA warfarin for hx of DVT/PE, new acute stroke, new Afib  Patient Measurements: Height: 6\' 2"  (188 cm) Weight: 78.8 kg (173 lb 11.6 oz) IBW/kg (Calculated) : 82.2  Heparin weight: 83.1  Vital Signs: Temp: 98.2 F (36.8 C) (01/04 1850) Temp Source: Axillary (01/04 1850) BP: 114/60 (01/04 1850) Pulse Rate: 79 (01/04 1850)  Labs: Recent Labs    05/08/20 0350 05/08/20 2154 05/09/20 0957 05/09/20 2012 05/10/20 0603 05/10/20 2050  HGB 11.0*  --  11.8*  --  11.6*  --   HCT 33.8*  --  35.9*  --  34.8*  --   PLT 359  --  392  --  448*  --   LABPROT 16.9*  --   --   --   --   --   INR 1.4*  --   --   --   --   --   HEPARINUNFRC  --    < > 0.18* 0.33 0.22* 0.24*  CREATININE 0.99  --  0.77  --  0.80  --    < > = values in this interval not displayed.    Estimated Creatinine Clearance: 80.7 mL/min (by C-G formula based on SCr of 0.8 mg/dL).  Assessment: 80 yo male with L symptomatic carotid stenosis.  S/p L carotid TCAR procedure w/ stenting that was complicated by postprocedure stroke.  MRI brain shows patchy L basal ganglia and L frontoparietal cortical and subcortical infarcts (new). No hemorrhagic transformation. New stroke and new onset atrial fibrillation. Previous reported hematuria but appears to have resolved.  PTA warfarin dose: 2.5mg  daily except 1.25mg  on Mondays.  1/2: IR consulted for PEG tube placement due to multiple failed swallow study tests.  Procedure planned for Thursday, 1/6 to allow adequate wash-out from warfarin.  Warfarin will be HELD starting today. Last dose of warfarin 05/07/20.  Pharmacy consulted to initiate heparin drip while warfarin held.   Heparin level remains subtherapeutic at 0.24.  Goal of Therapy:   Heparin level 0.3-0.5 units/ml (more narrow goal due to recent stroke) INR goal 2-3  Monitor platelets by anticoagulation protocol: Yes    Plan: -Increase  heparin 1700 units/h -Recheck heparin level in 8h   07/05/20, PharmD, Section, [facility] Clinical Pharmacist [phone] Please check AMION for all [facility] Pharmacy numbers 05/10/2020

## 2020-05-10 NOTE — Progress Notes (Signed)
Physical Therapy Treatment Patient Details Name: John Lambert MRN: [id number] DOB: [birth year] Today's Date: 05/10/2020    History of Present Illness John Lambert is a 80 y.o. male admitted 12/14 with bil LE weakness and difficulty walking as well as left hip pain. Imaging notable for Numerous scattered punctate acute infarctions in the left  posterior frontal region consistent with micro embolic infarctions. Punctate acute infarction also affecting the splenium of  the corpus callosum.  Pt with L carotid artery stenosis s/p stent on 04/27/20. Post procedure pt with right hemiparesis, aphasia and left gaze with intraprocedural stroke. Code stroke called with CT head (-) for acute findings. Emergent L carotid diagnostic cerebral cath angiogram (-) for acute findings. MRI 12/23 (+) patchy left basal ganglia as well as left frontoparietal cortical and subcortical infarcts. 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    PT Comments    Pt seen with OT today to maximize function. However, pt very depressed of mood and shaking head "no" to wanting to mobilize despite encouragement from therapists and daughter. Pt assisted to EOB with +2 tot A and able to grasp rail of stedy with LUE as well as extend L knee but would not attempt pushing up into standing. Became increasingly fatigued in sitting with LUE pushing and requiring mod/ max A. Pt returned to supine with +2 tot A and rolled bilaterally for bowel clean up. Decreasing treatment frequency to 2x/wk but can increase again if pt becomes more participatory. PT will continue to follow.   Follow Up Recommendations  SNF;Supervision/Assistance - 24 hour     Equipment Recommendations  Wheelchair (measurements PT);Wheelchair cushion (measurements PT);Hospital bed    Recommendations for Other Services       Precautions / Restrictions Precautions Precautions: Fall Precaution Comments:  cortrak, R hemi Restrictions Weight Bearing Restrictions: No    Mobility  Bed Mobility Overal bed mobility: Needs Assistance Bed Mobility: Supine to Sit;Sit to Supine Rolling: Max assist;+2 for physical assistance   Supine to sit: +2 for physical assistance;HOB elevated;Total assist Sit to supine: +2 for safety/equipment;Total assist   General bed mobility comments: total A +2 for all aspects of bed mobility with pt needing assist to maneuver BLEs to EOB towards pts L side and assist to elevate trunk into sitting. Rolled R and L for bowel clean up with max A +2  Transfers                 General transfer comment: had stedy in front of pt and pt grasped rail with LUE and RUE guided to rail with assist. Attempted to have pt stand 3x but pt not participating and kept shaking head "no" each time he was encouraged to try to push up.  Ambulation/Gait                 Stairs             Wheelchair Mobility    Modified Rankin (Stroke Patients Only) Modified Rankin (Stroke Patients Only) Pre-Morbid Rankin Score: No significant disability Modified Rankin: Severe disability     Balance Overall balance assessment: Needs assistance Sitting-balance support: Bilateral upper extremity supported;Feet supported Sitting balance-Leahy Scale: Poor Sitting balance - Comments: pt pushing with L UE towards the R but is able to reposition LUE in lap with max mutlimodal cues.  Pt requried MAX- MOD A for static sitting balance EOB Postural control: Posterior lean;Right lateral lean  Standing balance comment: Deferred this date.                            Cognition Arousal/Alertness: Lethargic;Awake/alert (moments of wakefulness and moments of lethargy) Behavior During Therapy: Flat affect Overall Cognitive Status: Impaired/Different from baseline Area of Impairment: Attention;Following commands;Problem solving;Safety/judgement;Awareness;Memory                    Current Attention Level: Alternating Memory: Decreased short-term memory Following Commands: Follows one step commands with increased time;Follows one step commands inconsistently Safety/Judgement: Decreased awareness of deficits Awareness: Emergent Problem Solving: Slow processing;Decreased initiation;Requires verbal cues;Requires tactile cues General Comments: pt unable to verbalize needs but does nod appropriately durng session, pt seems to be self limiting this session declining to attempt to sit<>stand from EOB, seems depressed in spirits      Exercises      General Comments General comments (skin integrity, edema, etc.): pt's daughter present and helpful in session. Pt's HR in 80-90's with activity      Pertinent Vitals/Pain Pain Assessment: Faces Faces Pain Scale: Hurts little more Pain Location: general discomfort with mobility however unable to verbalize location of pain Pain Descriptors / Indicators: Discomfort;Grimacing;Guarding Pain Intervention(s): Limited activity within patient's tolerance;Monitored during session    Home Living                      Prior Function            PT Goals (current goals can now be found in the care plan section) Acute Rehab PT Goals Patient Stated Goal: did not state but pt's wife stated goal to improve PT Goal Formulation: With patient/family Time For Goal Achievement: 05/13/20 Potential to Achieve Goals: Fair Progress towards PT goals: Not progressing toward goals - comment (pt not wanting to participate)    Frequency    Min 2X/week      PT Plan Frequency needs to be updated    Co-evaluation PT/OT/SLP Co-Evaluation/Treatment: Yes Reason for Co-Treatment: Complexity of the patient's impairments (multi-system involvement);Necessary to address cognition/behavior during functional activity;For patient/therapist safety PT goals addressed during session: Mobility/safety with mobility;Balance OT goals  addressed during session: ADL's and self-care;Other (comment) (sitting balance, attempted transfers)      AM-PAC PT "6 Clicks" Mobility   Outcome Measure  Help needed turning from your back to your side while in a flat bed without using bedrails?: Total Help needed moving from lying on your back to sitting on the side of a flat bed without using bedrails?: Total Help needed moving to and from a bed to a chair (including a wheelchair)?: Total Help needed standing up from a chair using your arms (e.g., wheelchair or bedside chair)?: Total Help needed to walk in hospital room?: Total Help needed climbing 3-5 steps with a railing? : Total 6 Click Score: 6    End of Session Equipment Utilized During Treatment: Gait belt Activity Tolerance: Patient limited by fatigue Patient left: in bed;with call bell/phone within reach;with bed alarm set;with family/visitor present Nurse Communication: Mobility status PT Visit Diagnosis: Muscle weakness (generalized) (M62.81);History of falling (Z91.81);Unsteadiness on feet (R26.81);Other abnormalities of gait and mobility (R26.89);Difficulty in walking, not elsewhere classified (R26.2);Other symptoms and signs involving the nervous system (R29.898);Hemiplegia and hemiparesis Hemiplegia - Right/Left: Right Hemiplegia - caused by: Cerebral infarction     Time: [id number] PT Time Calculation (min) (ACUTE ONLY): 35 min  Charges:  $Therapeutic Activity: 8-22 mins  Lyanne Co, PT  Acute Rehab Services  Pager [phone] Office [phone]    Lawana Chambers Priscilla Finklea 05/10/2020, 1:48 PM

## 2020-05-10 NOTE — Plan of Care (Signed)
  Problem: Education: Goal: Knowledge of disease or condition will improve Outcome: Progressing Goal: Knowledge of patient specific risk factors addressed and post discharge goals established will improve Outcome: Progressing Goal: Individualized Educational Video(s) Outcome: Progressing   Problem: Coping: Goal: Will verbalize positive feelings about self Outcome: Progressing Goal: Will identify appropriate support needs Outcome: Progressing   Problem: Health Behavior/Discharge Planning: Goal: Ability to manage health-related needs will improve Outcome: Progressing   Problem: Self-Care: Goal: Verbalization of feelings and concerns over difficulty with self-care will improve Outcome: Progressing   Problem: Nutrition: Goal: Risk of aspiration will decrease Outcome: Progressing Goal: Dietary intake will improve Outcome: Progressing   Problem: Ischemic Stroke/TIA Tissue Perfusion: Goal: Complications of ischemic stroke/TIA will be minimized Outcome: Progressing

## 2020-05-10 NOTE — Progress Notes (Signed)
PROGRESS NOTE    John Lambert  [id number] DOB: 1940-07-04 DOA: 04/19/2020 PCP: Harlan Stains, MD  Brief Narrative:  The patient is an 80 y.o.malewith PMH significant for hypertension, melanoma, neurofibroma, clotting disorder, history of DVT and PE on chronic Coumadinwhopresented to the ED on 12/14 with c/o : bilateral lower extremity weakness and difficulty ambulating associated with pain in the left hip region.(has a history of neurofibroma tumor removal from his spine about 4 years ago and melanoma removal from right calf about 30 years ago.) MRI brain obtainednoted numerousscattered punctate acute infarctions in the left posterior frontal region consistent with micro embolic infarctions which could either be in the anterior or middle cerebral arteryterritory. Punctate acute infarction also affecting the splenium ofthe corpus callosum.Extensive chronic small-vessel ischemic changes elsewherethroughout the brain.Small old left parietal vertex cortical and subcorticalInfarctions.  MRI lumbar spine showedchronicarachnoiditis with adhesions in the thecal sac and distorted cauda equina nerve roots from L2-L3 to the sacrum, but no evidence ofrecurrent spinal tumor.Possible right L5/S1 radiculitis after noting L4/5 multifactorial mild to moderate spinal stenosis. Vascular surgery was consulted and took patient on 12/22 for a left transcarotid artery stenting. Patient found to have 80% carotid stenosis and then following procedure down to less than 10% with stent. However, following surgery patient noted to have difficulty moving right side expressive aphasia and left gaze deviation and code stroke immediately called. Patient taken straight to CT scan. No new acute infarction noted. Patient underwent emergent left carotid diagnostic cerebral catheter angiogram with left MCA embolism noted from previous, but no acute findings. No intervention planned. Patient sent to medical ICU in  the absence of beds in the neuro ICU.EEG noted cortical dysfunction left hemisphere, but no seizures or epileptiform discharges seen throughout recording.  He was transferred to the stroke service on 04/27/2020 and then transferred out to the [facility] service on 04/29/2020.  Currently he continues to remain weak and his right upper arm is flaccid.  Now having gross hematuria as he has been placed on aspirin Plavix as well as heparin and Coumadin.  Heparin drip has now been stopped given that his Coumadin level therapeutic.  Next Coumadin will be held given his gross hematuria.  Renal function continues to worsen slightly.  Will need to continue monitor continue IV fluid hydration.  Will discuss with nephrology if renal function continues to worsen.  Case was discussed with urology Dr. Alexis Frock who recommends obtaining baseline imaging with a CT of the abdomen pelvis with and without contrast for hematuria.  Unfortunately imaging was not able to be obtained given his worsened renal function so this was done without contrast.  This showed possible pyelonephritis and pancreatitis was essentially ruled out given his normal lipase level.  Foley catheter was inserted yesterday and urine was sent off and grew out Klebsiella and we have placed him on antibiotics and his WBC is improving.  Renal function is improving now with Foley catheter drainage and he is diuresing fairly well.  Patient WBC is trending down as well as his renal function.  We will resume his anticoagulation on 05/05/2020.  Patient underwent CT scan of his chest abdomen pelvis yesterday.  He is little bit more somnolent today still case was discussed with neurology who ordered a repeat MRI which was unchanged and the EEG which still showed encephalopathy which was improved.  We will continue to monitor patient's clinical response closely.  Family today was concerned with some scrotal edema and erythema so his scrotum was elevated  and will obtain  scrotal ultrasound.  Likely this is in the setting of his anasarca and extreme volume overload  05/06/2020 scrotal edema is still there and he does have some bruising on his scrotum and a scrotal ultrasound was done below skin thickening in the right red tubular ectasia and there is also an ill-defined intratesticular heterogeneously hypoechoic area in the left lateral testicle as well as small bilateral hydroceles and bilateral varicoceles.  Urology was consulted and felt the patient had low suspicion of critical significant malignancy.  Recommend outpatient ultrasound in several months to verify stability..  Because of the scrotal ecchymosis and edema and Dr. Tresa Moore he was that this is just a cutaneous bruising and that it would take a few weeks for the color to change and feels that there is no hematoma abdomen on examination.  Urology recommends and trying a trial of void prior to discharge for having outpatient trial void in office.  Patient failed his swallow screen yesterday but he was awake and alert today.  Family wants to wait off on PEG tube placement and want another reevaluation prior to a being placed.  If patient fails again will pursue PEG tube Monday.  His sodium was also elevated induced insensible losses so his free water flushes were increased to 175 mL every 2 hours.  We will bit more awake and alert today  05/07/2020: Patient's renal function is improved but his hepatic function is slightly worsening so we will obtain a right upper quadrant ultrasound.  Patient underwent another swallow test and has failed so we will likely try to proceed with the PEG tube placement on Monday and have consulted interventional radiology.  WBC is slightly worse but he is on day 4 of antibiotics of IV ceftriaxone.  Overall hemoglobin remained stable and he is more awake and alert today than he was yesterday and his sodium is started to come down at 146.  05/08/2020 as labs are improving and hemoglobin is stable  as well as his renal function.  LFTs are slowly trending down now.  Interventional radiology was consulted for PEG tube placement and this cannot be done until Thursday, 05/13/2019.  They are recommending holding Coumadin until we will switch back to a heparin drip for now for anticoagulation and hold his Coumadin for 5 days as his last dose was 05/07/2020  05/09/2020 labs were relatively stable again and white blood cell count mildly jumped.  Plan is for a PEG tube placement on 05/13/2019.  Patient was coughing up a little bit more and repeat chest ray shows some mild bibasilar Elexis.  We will try and control his cough with antitussive's.  05/10/20: We will transition IV Ceftriaxone to po Keflex for total of 10 days. WBC slightly elevated to 12.7 and slowly trending up and likely reactive as he is afebrile.   Assessment & Plan:   Principal Problem:   Weakness Active Problems:   Anticoagulated on warfarin   Impaired ambulation   Fall at home, initial encounter   Essential hypertension   Blood clotting disorder (HCC)   Cerebral embolism with cerebral infarction   Stroke [facility])   Middle cerebral artery embolism, left  Acute CVA on presentation from left carotid artery stenosis status post left carotid stenting with complications with  new stroke.   -He underwent elective left carotid transcranial procedure with stenting complicated by postop stroke affecting his speech he is completely aphasic and now with dense right hemiplegia. -He had an emergent CT angiogram and  cerebral catheter angiogram which did not reveal large vessel occlusion. -EEG did not show any epileptiform activities. -MRI of the brain showed patchy left basal ganglia as well as left frontoparietal cortical and subcortical infarcts which are new in addition to the previous recent left MCA frontal infarct which was subacute. -Seen by speech therapy modified barium swallow consistent with silent aspiration. -Core track tube was placed  which he pulled it out. -Core track tube reinserted 05/02/2020 by dietary.  Tube feeds restarted 05/02/2020. -PT OT recommended CIR but now he is too weak to participate and recommendation has been changed to SNF. -Hemoglobin A1c is 5.3, LDL is 84,  -Echo shows normal ejection fraction with no source of emboli. -Carotid Doppler with left ICA 80 to 99%. -His INR became therapeutic and then dropped when his Coumadin was held; last INR was 1.4 today and will hold his Coumadin again and place him on a heparin drip -Patient is on aspirin Plavix heparin and Coumadin and vascular surgery has recommended triple therapy for 1 month and then going on Coumadin and aspirin alone; currently remains on aspirin Plavix and Coumadin has been held and been transitioned to heparin drip for his PEG tube placement -Neurology recommends to continue aspirin and Plavix for 30 days for fresh carotid stent then aspirin alone.  His Coumadin was held initially for his hematuria but was resumed and now being held again given that he will be getting a percutaneous gastrostomy tube and will place on a heparin drip in the interim -Palliative care consulted for goals of care discussion and a plan in place in case the patient did not improve and family resistant for further goals of care and hopeful for SNF.  Palliative care will now follow peripherally -CT of the abdomen pelvis done and showed no hydronephrosis of either kidney so we will resume his anticoagulation given that his urinary cleared and improved; patient seems to be tolerating his anticoagulation well and has no overt signs of bleeding  Dyspnea with increased labored breathing acute respiratory failure with hypoxia, improving -Chest x-ray done this morning and showed "Masslike density at the RIGHT hilum, suspicious for neoplastic mass or lymphadenopathy, less likely perihilar pneumonia. Recommend chest CT for further characterization. Diffuse bilateral interstitial  prominence, presumably some degree of edema/volume overload. Additional patchy opacities within the bilateral perihilar and basilar regions, pneumonia versus pulmonary edema, alternatively pleural plaques related to previous asbestos exposure. Enteric tube passes below the diaphragm"  -Fluid stopped and given IV Lasix 40 mg x 1 a few days ago -CT scan showed no evidence of pneumonia but there was mild scarring/fibrotic change within his lung and there is no noted consolidation, pleural effusion or pneumothorax; CT did not mention any masslike consolidation -Started Xopenex/Atrovent breathing treatments -SpO2: 95 % O2 Flow Rate (L/min): 2 L/min -Continue supplemental oxygen via nasal cannula and wean O2 as follow -Continuous pulse oximetry maintain O2 saturation greater 90% -Abx transition to po Keflex -Obtain procalcitonin level in the morning -Repeat chest x-ray yesterday (05/09/20) showed minimal atelectasis.  Left carotid artery stenosis vascular surgery status post carotid artery stenting  -Vascular was following and signed off -He will need Aspirin and Plavix for at least a month  History of clotting disorder including PE DVT  -Was on Coumadin prior to admission.   -Doppler of the lower extremity shows chronic DVT.  Continued heparin and Coumadin but both of them and held.  INR was therapeutic but now trended back down; Pharmacy following INR and is  now 1.4 -Coumadin was resumed 05/05/2020 but now will be held again today 05/08/2020 and will transition him to heparin drip for now given that he will be going for percutaneous gastrostomy tube  Hyperlipidemia  -LDL was 84  -Continue Pravastatin 40 mg daily. -Continue the same dose upon discharge.  Paroxysmal A. fib  -CHA2DS2-VASc score above 5 continue Coumadin on discharge.   -INR became therapeutic at 2.5 but then patient had some hematuria so this was held and his heparin drip was stopped.  Coumadin had been resumed on 05/05/2020.  Pharmacy adjusting the dose and Coumadin has been resumed but will be held again on 05/08/2020 as his last dose was 05/07/2020 given that he will be going for a percutaneous gastrostomy tube on 05/13/2019.  In the interim we will place him on a heparin drip given his elevated CHA2DS2-VASc score and this is being continued with no overt bleeding noted   Essential HTN -Currently on Carvedilol 25 mg po BID.  Off of Cleviprex drip.  Dysphagia and Nutrition  -Patient pulled out a Cortrak tube 04/2420 night.   -Patient had MBS done on 04/29/2020 with findings consistent with silent aspiration. -Cortrak tube was placed again on 05/02/2020.  Continue with nutrition through tube feedings and have increased free water flushes given his hyper per natremia -Continue with nutrition through tube feedings and they were held given his rhonchorous breath sounds -We will need to discuss PEG tube placement if the patient is not able to eat; -Speech therapy states that the patient failed miserably and was worse today than he did yesterday we will need to initiate discussions about PEG tube placement given that the Panda tube is not a permanent fix.  Patient's family is likely going to lean towards PEG tube placement but want to have a repeat SLP evaluation which I have ordered again.  If patient fails again we will likely pursue PEG tube on Monday and will continue core track tube feeding for now; SLP evaluated and recommending still n.p.o. so we will proceed with a PEG tube and have consulted interventional radiology for assistance with placement -I specifically spoke with the family and discussed with him that having a PEG tube will not prevent the patient from aspirating and they are in understanding and agreement  AKI on CKD stage II improving Metabolic Acidosis -His creatinine did trend up and initially they thought was prerenal but could be obstructive and I feel strongly that this is obstruction -IV fluids have now  stopped -Renal work-up showing that his urinalysis was just pure blood and that his urine had many bacteria, greater than 50 RBCs, 21-50 WBCs with no squamous epithelial cells.  Urine osmolality was 577, urine creatinine was 36.69, urine sodium level 45 -Urine cultures showing Klebsiella Oxytocoa and he is placed on antibiotics -Renal Ultrasound showed "Bladder is distended and contains debris. Bladder infection could present this fashion. Prevoid volume 951.9 CC. Mild prominence right renal pelvis noted. 2. Increased echogenicity both kidneys consistent with chronic medical renal disease." -I spoke with with Dr. Tresa Moore of Urology who recommended a CT of the abdomen and pelvis with and without contrast for baseline imaging given his hematuria and was concerned that his bladder prevoid volume was 951. -I discussed with nephrology Dr. Carolin Sicks who feels that this is all obstruction and post renal and recommended Foley catheter placement which was done. -Patient's BUNs/creatinine is now trending back down and BUN/creatinine is now normalized with a BUN/creatinine 31/0.80 -If not improving with Foley catheter  drainage and antibiotic coverage definitely will consult nephrology for further evaluation formally but he is improved   Acute Urinary Retention -Bladder ultrasound showed greater than 960 today again and renal ultrasound as above  -Foley catheter was placed and he had immediate 1800 mL output -Continue with Foley catheter for now and drainage -May need to  add Tamsulosin unfortunately cannot be given through the NG tube -CT scan of the abdomen without pelvis done given his renal function worsening and showed some hydronephrosis of either kidney and no renal or ureteral calculi identified but there was fluid/inflammation of uncertain origin with a differential including pancreatitis and pyelonephritis but could have been also localizing simple ascites -See below; will likely leave Foley catheter in  place for a while and urology recommending trial of void the day before or the day of discharge versus outpatient; will need to monitor closely -Dr. Tresa Moore has also started the patient on finasteride and wanted to start the patient on tamsulosin but cannot because it cannot be put through his Cortrak  Hypernatremia, improved -Likely secondary to insensible losses as he has a Panda Tube -Edema level this morning was 150 and will increase his free water flushes with 175 mL every 4 hours every 2 hours and reevaluate.  Now will cut Free Water Flushes back from q2h to q4h to q6h today (05/10/20); If necessary will go back to 175 mL q4h -Repeat BMP showing that the sodium is starting to trend down and is normalized at 144  Normocytic Anemia -Patient's hemoglobin did drop slightly but is now trending back up  -Hemoglobin/hematocrit is stable at and actually improved and it is 11.6/34.8 today -Urine is clearer and no evidence of overt bleeding or hematuria -We will resume his anticoagulation with Coumadin today and continue to monitor for signs and symptoms of bleeding; currently no more overt bleeding noted -Repeat CBC in a.m.  Klebsiella Urinary Tract Infection -Urine was very dark yesterday and tested and is very turbid and brown-colored but did show many bacteria, more than 50 RBCs per high-power field, no squamous epithelial cells, and then 21-50 WBCs -Culture showing greater than 100,000 colony-forming units of Klebsiella Oxytocoa -Pete urinalysis done today showed a cloudy appearance with yellow color with large hemoglobin, large leukocytes, negative nitrites, many bacteria, 21-50 RBCs per high-power field, 21-50 WBCs -Empirically started IV ceftriaxone and he has completed 7 days and will stop and change to po for at total of 10 days of Abx -Patient's WBC is trending upward went from 9.1 -> 12.3 -> 15.4 -> 13.2 -> 8.8 -> 10.7 -> 9.9 -> 11.4 and is now 12.7 -Blood cultures x2 1 out of 4 bottles with  likely contaminant and repeat blood cultures done and showed no growth to date -Could potentially have pyelonephritis given the fluid/inflammation of unknown etiology that is ill-defined and centered around the pancreatic tail and just anterior to the left kidney; I doubt that this is pancreatitis given that his lipase level was normal and this likely could be pyelonephritis and will have Urology weigh in further -Repeat CBC in a.m.  Gross Hematuria, improved after Foley catheter was placed -See above -Spoke with urology who recommends holding off Foley catheter now and obtain a CT of the abdomen pelvis for further baseline imaging and then review -His Coumadin was held yesterday and will continue to hold for now and if improves can likely resume tomorrow night -Discussed with urology Dr. Diona Fanti and he states that he will have Dr. Tresa Moore evaluate -Once  urology comes by will have them evaluate the CT scan of the abdomen pelvis and weigh in on the fluid collection is ill-defined -Coumadin was resumed on 05/05/2020 given that his urine is clearer and will hold for now given that he will need to be off of oral anticoagulation for 5 days for his PEG tube placement on Thursday 05/13/19.  We resumed a a heparin drip and continue to monitor for signs and symptoms of hematuria  Hypokalemia -Improved -Patient potassium now 4.5 -Continue monitor if it is necessary -Repeat CMP in a.m.  Scrotal edema and ecchymosis -Elevation and scrotal ultrasound next-likely in setting of his anasarca -Continue with antibiotics -Doppler showed "Bilateral scrotal skin thickening. Right rete tubular ectasia. There is an intra testicular ill-defined heterogeneously hypoechoic area in the left lateral testicle measuring approximately 1.3 cmwith some internal blood flow. This has nonspecific imaging features. Differential considerations include unusual appearance of rete testis versus intra testicular mass. Presence of internal  blood flow favors against testicular hematoma or segmental infarct. Recommend urologic consultation and potentially MRI for more detailed evaluation. There is also a subcentimeter left intra testicular cyst. Small  bilateral hydroceles.  Bilateral varicoceles. Bilateral epididymal head cysts."  Abnormal LFTs Hyperbilirubinemia -Patient is AST is starting to trend up and went from 26 -> 58 -> 100 -> 64 -> 56 -> 47 -Patient's ALT is trending up and went from 35 -> 60 -> 97 -> 90 -> 87 -> 79 -Patient's T bili is also started trending up and went from 0.6 -> 0.8 -> 1.3 -> 1.1 -> 0.7 -We will obtain a right upper quadrant ultrasound and check an acute hepatitis panel in the a.m. -Continue to monitor and trend hepatic function carefully and repeat CMP in a.m.  DVT prophylaxis: Anticoagulated with Coumadin but will hold this and start a heparin drip in the interim given his elevated CHA2DS2-VASc score as he needs to be off of anticoagulation for PEG tube placement will be taking place on 05/13/2019 Code Status: FULL CODE Family Communication: No family present at bedside but will give the family a update via Telephone Disposition Plan: Pending further clinical improvement and clearance by neurology; will need SNF level of care and she is medically stable  Status is: Inpatient  Remains inpatient appropriate because:Unsafe d/c plan, IV treatments appropriate due to intensity of illness or inability to take PO and Inpatient level of care appropriate due to severity of illness  Dispo: The patient is from: Home              Anticipated d/c is to: SNF              Anticipated d/c date is: 3 days              Patient currently is not medically stable to d/c.  Consultants:   Vascular Surgery  Neurology  Palliative Care Medicine  Discussed the case with nephrology Dr. Carolin Sicks  Urology Dr. Dahlstedt/Dr. Tresa Moore   Procedures:  Status post left transcarotid artery stenting for symptomatic disease  complicated by stroke  Antimicrobials:  Anti-infectives (From admission, onward)   Start     Dose/Rate Route Frequency Ordered Stop   05/11/20 0600  cephALEXin (KEFLEX) 250 MG/5ML suspension 500 mg        500 mg Per Tube Every 8 hours 05/10/20 1036 05/14/20 0559   05/04/20 0945  azithromycin (ZITHROMAX) 500 mg in sodium chloride 0.9 % 250 mL IVPB  Status:  Discontinued  500 mg 250 mL/hr over 60 Minutes Intravenous Every 24 hours 05/04/20 0859 05/05/20 0956   05/04/20 0900  cefTRIAXone (ROCEPHIN) 1 g in sodium chloride 0.9 % 100 mL IVPB  Status:  Discontinued        1 g 200 mL/hr over 30 Minutes Intravenous Every 24 hours 05/04/20 0803 05/10/20 1036   04/27/20 0600  ceFAZolin (ANCEF) IVPB 2g/100 mL premix  Status:  Discontinued       Note to Pharmacy: [place] with pt to OR   2 g 200 mL/hr over 30 Minutes Intravenous On call 04/26/20 0805 04/26/20 2018   04/27/20 0600  ceFAZolin (ANCEF) IVPB 2g/100 mL premix        2 g 200 mL/hr over 30 Minutes Intravenous 30 min pre-op 04/26/20 2013 04/27/20 0804   04/27/20 0000  ceFAZolin (ANCEF) IVPB 1 g/50 mL premix  Status:  Discontinued       Note to Pharmacy: Send with pt to OR   1 g 100 mL/hr over 30 Minutes Intravenous On call 04/26/20 0800 04/26/20 0805        Subjective: Seen and examined at bedside and was resting and in no acute distress.  Not coughing as much.  Labs are relatively stable.  No family currently at bedside.  Patient denied any pain when I asked him.  I asked him if he slept okay last night he shook his head no.  No other concerns or complaints at this time.  Objective: Vitals:   05/10/20 0322 05/10/20 0324 05/10/20 0744 05/10/20 1203  BP: 97/66  105/73 103/60  Pulse: 97  84 71  Resp: 18  16 18   Temp: 98.4 F (36.9 C)  98 F (36.7 C) 97.8 F (36.6 C)  TempSrc: Oral   Oral  SpO2: 95%  97% 95%  Weight:  78.8 kg    Height:        Intake/Output Summary (Last 24 hours) at 05/10/2020 1258 Last data filed at 05/10/2020  0847 Gross per 24 hour  Intake 391.3 ml  Output 1725 ml  Net -1333.7 ml   Filed Weights   05/08/20 0402 05/09/20 0500 05/10/20 0324  Weight: 83.1 kg 81.5 kg 78.8 kg   Examination: Physical Exam:  Constitutional: Patient is an ill-appearing elderly Caucasian male currently in no acute distress and is not coughing as much as he was yesterday.  He is resting and easily awoken and currently in no acute distress and denies any pain. Eyes: Lids and conjunctivae normal, sclerae anicteric  ENMT: External Ears, Nose appear normal. Grossly normal hearing.  Has a Cortrak in his left nare Neck: Appears normal, supple, no cervical masses, normal ROM, no appreciable thyromegaly: No JVD Respiratory: Slightly diminished to auscultation bilaterally with coarse breath sounds, no wheezing, rales, rhonchi or crackles.  Unlabored breathing and he is not tachypneic but he is wearing 2 L supplemental oxygen via nasal cannula Cardiovascular: RRR, no murmurs / rubs / gallops. S1 and S2 auscultated.  No appreciable lower extremity edema. Abdomen: Soft, non-tender, non-distended. Bowel sounds positive.  GU: Deferred.  Foley catheter is in place and urine color is yellow Musculoskeletal: No clubbing / cyanosis of digits/nails. No joint deformity upper and lower extremities.  Skin: No rashes, lesions, ulcers on limited skin evaluation. No induration; Warm and dry.  Neurologic: He remains aphasic and does have some hemiplegia and right arm flaccidity and right leg weakness. Psychiatric: Normal judgment and insight.  He is resting but then easily alert and oriented x 3.  Normal mood and appropriate affect.   Data Reviewed: I have personally reviewed following labs and imaging studies  CBC: Recent Labs  Lab 05/06/20 0400 05/07/20 0207 05/08/20 0350 05/09/20 0957 05/10/20 0603  WBC 8.8 10.7* 9.9 11.4* 12.7*  NEUTROABS 6.3 8.1* 7.4 8.6* 9.4*  HGB 10.6* 11.3* 11.0* 11.8* 11.6*  HCT 31.9* 35.6* 33.8* 35.9* 34.8*   MCV 91.9 93.2 93.6 92.5 92.1  PLT 299 392 359 392 [id number]*   Basic Metabolic Panel: Recent Labs  Lab 05/06/20 0400 05/06/20 1523 05/07/20 0207 05/08/20 0350 05/09/20 0957 05/10/20 0603  NA 150* 147* 146* 144 136 136  K 4.1 4.5 4.7 4.5 4.6 4.5  CL 115* 112* 111 108 102 102  CO2 26 24 25 26 23 25   GLUCOSE 132* 132* 114* 132* 121* 119*  BUN 48* 40* 38* 36* 32* 31*  CREATININE 1.24 1.11 0.97 0.99 0.77 0.80  CALCIUM 8.4* 8.5* 8.5* 8.5* 8.3* 8.2*  MG 1.8 2.3 2.1 2.0 2.0 2.0  PHOS 4.6  --  4.1 3.6 3.3 3.3   GFR: Estimated Creatinine Clearance: 80.7 mL/min (by C-G formula based on SCr of 0.8 mg/dL). Liver Function Tests: Recent Labs  Lab 05/06/20 0400 05/07/20 0207 05/08/20 0350 05/09/20 0957 05/10/20 0603  AST 58* 100* 64* 56* 47*  ALT 60* 97* 90* 87* 79*  ALKPHOS 32* 37* 37* 40 41  BILITOT 0.8 1.3* 1.1 0.7 0.7  PROT 5.2* 5.6* 5.8* 5.8* 5.7*  ALBUMIN 2.3* 2.4* 2.8* 2.7* 2.7*   Recent Labs  Lab 05/04/20 1326  LIPASE 49   No results for input(s): AMMONIA in the last 168 hours. Coagulation Profile: Recent Labs  Lab 05/04/20 0225 05/05/20 0302 05/06/20 0400 05/07/20 0207 05/08/20 0350  INR 1.8* 1.6* 1.5* 1.5* 1.4*   Cardiac Enzymes: No results for input(s): CKTOTAL, CKMB, CKMBINDEX, TROPONINI in the last 168 hours. BNP (last 3 results) No results for input(s): PROBNP in the last 8760 hours. HbA1C: No results for input(s): HGBA1C in the last 72 hours. CBG: Recent Labs  Lab 05/09/20 2042 05/09/20 2352 05/10/20 0318 05/10/20 0744 05/10/20 1115  GLUCAP 118* 106* 125* 136* 116*   Lipid Profile: No results for input(s): CHOL, HDL, LDLCALC, TRIG, CHOLHDL, LDLDIRECT in the last 72 hours. Thyroid Function Tests: No results for input(s): TSH, T4TOTAL, FREET4, T3FREE, THYROIDAB in the last 72 hours. Anemia Panel: No results for input(s): VITAMINB12, FOLATE, FERRITIN, TIBC, IRON, RETICCTPCT in the last 72 hours. Sepsis Labs: No results for input(s): PROCALCITON,  LATICACIDVEN in the last 168 hours.  Recent Results (from the past 240 hour(s))  Culture, Urine     Status: Abnormal   Collection Time: 05/03/20 10:42 AM   Specimen: Urine, Random  Result Value Ref Range Status   Specimen Description URINE, RANDOM  Final   Special Requests   Final    NONE Performed at [facility], [address] [place], [place] [address]    Culture >=100,000 COLONIES/mL KLEBSIELLA OXYTOCA (A)  Final   Report Status 05/05/2020 FINAL  Final   Organism ID, Bacteria KLEBSIELLA OXYTOCA (A)  Final      Susceptibility   Klebsiella oxytoca - MIC*    AMPICILLIN RESISTANT Resistant     CEFAZOLIN 8 SENSITIVE Sensitive     CEFEPIME <=0.12 SENSITIVE Sensitive     CEFTRIAXONE <=0.25 SENSITIVE Sensitive     CIPROFLOXACIN <=0.25 SENSITIVE Sensitive     GENTAMICIN <=1 SENSITIVE Sensitive     IMIPENEM <=0.25 SENSITIVE Sensitive     NITROFURANTOIN <=  16 SENSITIVE Sensitive     TRIMETH/SULFA <=20 SENSITIVE Sensitive     AMPICILLIN/SULBACTAM 4 SENSITIVE Sensitive     PIP/TAZO <=4 SENSITIVE Sensitive     * >=100,000 COLONIES/mL KLEBSIELLA OXYTOCA  Culture, blood (routine x 2)     Status: Abnormal   Collection Time: 05/04/20  4:46 PM   Specimen: BLOOD  Result Value Ref Range Status   Specimen Description BLOOD BLOOD LEFT FOREARM  Final   Special Requests   Final    BOTTLES DRAWN AEROBIC ONLY Blood Culture adequate volume   Culture  Setup Time   Final    AEROBIC BOTTLE ONLY GRAM POSITIVE COCCI Organism ID to follow CRITICAL RESULT CALLED TO, READ BACK BY AND VERIFIED WITH: J LEDFORD [facility] 05/06/20 0615 JDW    Culture (A)  Final    STAPHYLOCOCCUS EPIDERMIDIS THE SIGNIFICANCE OF ISOLATING THIS ORGANISM FROM A SINGLE SET OF BLOOD CULTURES WHEN MULTIPLE SETS ARE DRAWN IS UNCERTAIN. PLEASE NOTIFY THE MICROBIOLOGY DEPARTMENT WITHIN ONE WEEK IF SPECIATION AND SENSITIVITIES ARE REQUIRED. Performed at [facility], [address] [place], [place] [address]    Report  Status 05/07/2020 FINAL  Final  Blood Culture ID Panel (Reflexed)     Status: Abnormal   Collection Time: 05/04/20  4:46 PM  Result Value Ref Range Status   Enterococcus faecalis NOT DETECTED NOT DETECTED Final   Enterococcus Faecium NOT DETECTED NOT DETECTED Final   Listeria monocytogenes NOT DETECTED NOT DETECTED Final   Staphylococcus species DETECTED (A) NOT DETECTED Final    Comment: CRITICAL RESULT CALLED TO, READ BACK BY AND VERIFIED WITH: J LEDFORD [facility] 05/06/20 0615 JDW    Staphylococcus aureus (BCID) NOT DETECTED NOT DETECTED Final   Staphylococcus epidermidis DETECTED (A) NOT DETECTED Final    Comment: Methicillin (oxacillin) resistant coagulase negative staphylococcus. Possible blood culture contaminant (unless isolated from more than one blood culture draw or clinical case suggests pathogenicity). No antibiotic treatment is indicated for blood  culture contaminants. CRITICAL RESULT CALLED TO, READ BACK BY AND VERIFIED WITH: J LEDFORD [facility] 05/06/20 0615 JDW    Staphylococcus lugdunensis NOT DETECTED NOT DETECTED Final   Streptococcus species NOT DETECTED NOT DETECTED Final   Streptococcus agalactiae NOT DETECTED NOT DETECTED Final   Streptococcus pneumoniae NOT DETECTED NOT DETECTED Final   Streptococcus pyogenes NOT DETECTED NOT DETECTED Final   A.calcoaceticus-baumannii NOT DETECTED NOT DETECTED Final   Bacteroides fragilis NOT DETECTED NOT DETECTED Final   Enterobacterales NOT DETECTED NOT DETECTED Final   Enterobacter cloacae complex NOT DETECTED NOT DETECTED Final   Escherichia coli NOT DETECTED NOT DETECTED Final   Klebsiella aerogenes NOT DETECTED NOT DETECTED Final   Klebsiella oxytoca NOT DETECTED NOT DETECTED Final   Klebsiella pneumoniae NOT DETECTED NOT DETECTED Final   Proteus species NOT DETECTED NOT DETECTED Final   Salmonella species NOT DETECTED NOT DETECTED Final   Serratia marcescens NOT DETECTED NOT DETECTED Final   Haemophilus influenzae NOT  DETECTED NOT DETECTED Final   Neisseria meningitidis NOT DETECTED NOT DETECTED Final   Pseudomonas aeruginosa NOT DETECTED NOT DETECTED Final   Stenotrophomonas maltophilia NOT DETECTED NOT DETECTED Final   Candida albicans NOT DETECTED NOT DETECTED Final   Candida auris NOT DETECTED NOT DETECTED Final   Candida glabrata NOT DETECTED NOT DETECTED Final   Candida krusei NOT DETECTED NOT DETECTED Final   Candida parapsilosis NOT DETECTED NOT DETECTED Final   Candida tropicalis NOT DETECTED NOT DETECTED Final   Cryptococcus neoformans/gattii NOT DETECTED NOT DETECTED  Final   Methicillin resistance mecA/C DETECTED (A) NOT DETECTED Final    Comment: CRITICAL RESULT CALLED TO, READ BACK BY AND VERIFIED WITH: Karsten Ro [facility] 05/06/20 0615 JDW Performed at [facility], [address] [place], [place] [address]   Culture, blood (routine x 2)     Status: None   Collection Time: 05/04/20  4:52 PM   Specimen: BLOOD LEFT HAND  Result Value Ref Range Status   Specimen Description BLOOD LEFT HAND  Final   Special Requests   Final    BOTTLES DRAWN AEROBIC ONLY Blood Culture adequate volume   Culture   Final    NO GROWTH 5 DAYS Performed at [facility], [place] [address], [place], [place] [address]    Report Status 05/09/2020 FINAL  Final  Culture, blood (routine x 2)     Status: None (Preliminary result)   Collection Time: 05/06/20  9:00 AM   Specimen: BLOOD  Result Value Ref Range Status   Specimen Description BLOOD RIGHT ANTECUBITAL  Final   Special Requests   Final    BOTTLES DRAWN AEROBIC AND ANAEROBIC Blood Culture adequate volume   Culture   Final    NO GROWTH 4 DAYS Performed at [facility], [place] [address] [place], [place] [address]    Report Status PENDING  Incomplete  Culture, blood (routine x 2)     Status: None (Preliminary result)   Collection Time: 05/06/20  9:03 AM   Specimen: BLOOD RIGHT ARM  Result Value Ref Range Status   Specimen Description BLOOD  RIGHT ARM  Final   Special Requests   Final    BOTTLES DRAWN AEROBIC AND ANAEROBIC Blood Culture adequate volume   Culture   Final    NO GROWTH 4 DAYS Performed at [facility], [place] [address] [place], [place] [address]    Report Status PENDING  Incomplete    RN Pressure Injury Documentation:     Estimated body mass index is 22.3 kg/m as calculated from the following:   Height as of this encounter: 6\' 2"  (1.88 m).   Weight as of this encounter: 78.8 kg.  Malnutrition Type:  Nutrition Problem: Inadequate oral intake Etiology: dysphagia   Malnutrition Characteristics:  Signs/Symptoms: NPO status   Nutrition Interventions:  Interventions: Tube feeding   Radiology Studies: DG CHEST PORT 1 VIEW  Result Date: 05/09/2020 CLINICAL DATA:  Shortness of breath, cough EXAM: PORTABLE CHEST 1 VIEW COMPARISON:  Portable exam 0929 hours compared to 05/08/2020 FINDINGS: Feeding tube extends into stomach. Normal heart size, mediastinal contours, and pulmonary vascularity. No definite acute infiltrate, pleural effusion or pneumothorax. Minimal bibasilar atelectasis noted. Osseous structures unremarkable. IMPRESSION: Minimal bibasilar atelectasis. Electronically Signed   By: Lavonia Dana M.D.   On: 05/09/2020 09:39   DG CHEST PORT 1 VIEW  Result Date: 05/08/2020 CLINICAL DATA:  Cough. EXAM: PORTABLE CHEST 1 VIEW COMPARISON:  May 05, 2020. FINDINGS: The heart size and mediastinal contours are within normal limits. Both lungs are clear. The visualized skeletal structures are unremarkable. IMPRESSION: No active disease. Electronically Signed   By: Marijo Conception M.D.   On: 05/08/2020 16:21   Scheduled Meds: . aspirin  81 mg Per Tube Daily  . carvedilol  25 mg Per Tube BID WC  . [START ON 05/11/2020] cephALEXin  500 mg Per Tube Q8H  . Chlorhexidine Gluconate Cloth  6 each Topical Daily  . clopidogrel  75 mg Per Tube Daily  .  feeding supplement (PROSource TF)  45 mL Per Tube BID  .  finasteride  5 mg Oral Daily  . free water  175 mL Per Tube Q4H  . insulin aspart  0-9 Units Subcutaneous Q4H  . pravastatin  40 mg Per Tube Daily   Continuous Infusions: . feeding supplement (JEVITY 1.5 CAL/FIBER) 1,000 mL (05/10/20 0833)  . heparin 1,550 Units/hr (05/10/20 0954)    LOS: 19 days   Merlene Laughter, DO [facility] PAGER is on AMION  If 7PM-7AM, please contact night-coverage [email or web address]

## 2020-05-10 NOTE — Progress Notes (Signed)
ANTICOAGULATION CONSULT NOTE  Pharmacy Consult for Warfarin >> heparin Indication: on PTA warfarin for hx of DVT/PE, new acute stroke, new Afib  Patient Measurements: Height: 6\' 2"  (188 cm) Weight: 78.8 kg (173 lb 11.6 oz) IBW/kg (Calculated) : 82.2  Heparin weight: 83.1  Vital Signs: Temp: 98 F (36.7 C) (01/04 0744) Temp Source: Oral (01/04 0322) BP: 105/73 (01/04 0744) Pulse Rate: 84 (01/04 0744)  Labs: Recent Labs    05/08/20 0350 05/08/20 2154 05/09/20 0957 05/09/20 2012 05/10/20 0603  HGB 11.0*  --  11.8*  --  11.6*  HCT 33.8*  --  35.9*  --  34.8*  PLT 359  --  392  --  448*  LABPROT 16.9*  --   --   --   --   INR 1.4*  --   --   --   --   HEPARINUNFRC  --    < > 0.18* 0.33 0.22*  CREATININE 0.99  --  0.77  --  0.80   < > = values in this interval not displayed.    Estimated Creatinine Clearance: 80.7 mL/min (by C-G formula based on SCr of 0.8 mg/dL).  Assessment: 80 yo male with L symptomatic carotid stenosis.  S/p L carotid TCAR procedure w/ stenting that was complicated by postprocedure stroke.  MRI brain shows patchy L basal ganglia and L frontoparietal cortical and subcortical infarcts (new). No hemorrhagic transformation. New stroke and new onset atrial fibrillation. Previous reported hematuria but appears to have resolved.  PTA warfarin dose: 2.5mg  daily except 1.25mg  on Mondays.  1/2: IR consulted for PEG tube placement due to multiple failed swallow study tests.  Procedure planned for Thursday, 1/6 to allow adequate wash-out from warfarin.  Warfarin will be HELD starting today. Last dose of warfarin 05/07/20.  Pharmacy consulted to initiate heparin drip while warfarin held.   Heparin level came back subtherapeutic today again. We will increase the rate and again. CBC stable  Goal of Therapy:   Heparin level 0.3-0.5 units/ml (more narrow goal due to recent stroke) INR goal 2-3  Monitor platelets by anticoagulation protocol: Yes    Plan: -Increase  heparin 1550 units/hr -8 hr HL -Daily heparin level and CBC  07/05/20, PharmD, BCIDP, AAHIVP, CPP Infectious Disease Pharmacist 05/10/2020 9:07 AM

## 2020-05-10 NOTE — Progress Notes (Signed)
Occupational Therapy Treatment Patient Details Name: John Lambert MRN: [id number] DOB: [birth year] Today's Date: 05/10/2020    History of present illness John Lambert is a 80 y.o. male admitted 12/14 with bil LE weakness and difficulty walking as well as left hip pain. Imaging notable for Numerous scattered punctate acute infarctions in the left  posterior frontal region consistent with micro embolic infarctions. Punctate acute infarction also affecting the splenium of  the corpus callosum.  Pt with L carotid artery stenosis s/p stent on 04/27/20. Post procedure pt with right hemiparesis, aphasia and left gaze with intraprocedural stroke. Code stroke called with CT head (-) for acute findings. Emergent L carotid diagnostic cerebral cath angiogram (-) for acute findings. MRI 12/23 (+) patchy left basal ganglia as well as left frontoparietal cortical and subcortical infarcts. 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   OT comments  Pt seen in conjunction with PT to maximize pts activity tolerance as pt unable to tolerate two therapy session currently. Pt continues to present with impaired sitting balance, R sided hemiplegia, decreased activity tolerance and generalized weakness impacting pts ability to complete BADLs independently. Pt currently requires total A +2 for all aspects of bed mobility. Pt able to sit EOB with BUE supported with MOD- MAX A for static sitting balance. Pt continues to push with LUE but able to reposition LUE on pts lap with max multimodal cues. Pt declined attempting sit<>stand with stedy d/t fatigue and weakness despite max encouragement from therapists and pts daughter. Pt currently requires total A for LB ADLs from bed level and total A for UB ADLs. Pt would continue to benefit from skilled occupational therapy while admitted and after d/c to address the below listed limitations in order to improve overall functional  mobility and facilitate independence with BADL participation. DC plan remains appropriate, will follow acutely per POC.    Follow Up Recommendations  SNF    Equipment Recommendations  Other (comment) (TBD)    Recommendations for Other Services      Precautions / Restrictions Precautions Precautions: Fall Precaution Comments: cortrak, R hemi Restrictions Weight Bearing Restrictions: No       Mobility Bed Mobility Overal bed mobility: Needs Assistance Bed Mobility: Supine to Sit;Sit to Supine     Supine to sit: +2 for physical assistance;HOB elevated;Total assist Sit to supine: +2 for safety/equipment;Total assist   General bed mobility comments: total A +2 for all aspects of bed mobility with pt needing assist to maneuver BLEs to EOB towards pts L side and assist to elevate trunk into sitting  Transfers                 General transfer comment: pt declined attempting to stand from EOB with stedy    Balance Overall balance assessment: Needs assistance Sitting-balance support: Bilateral upper extremity supported;Feet supported Sitting balance-Leahy Scale: Poor Sitting balance - Comments: pt pushing with L UE towards the R but is able to reposition LUE in lap with max mutlimodal cues.  Pt requried MAX- MOD A for static sitting balance EOB Postural control: Posterior lean;Right lateral lean     Standing balance comment: Deferred this date.                           ADL either performed or assessed with clinical judgement   ADL Overall ADL's : Needs assistance/impaired  Upper Body Bathing: Total assistance;Sitting Upper Body Bathing Details (indicate cue type and reason): to wash pts back from EOB Lower Body Bathing: Total assistance;Bed level Lower Body Bathing Details (indicate cue type and reason): d/t incontinent BM from bed level           Toilet Transfer Details (indicate cue type and reason): unable to attempt as pt NT Toileting-  Clothing Manipulation and Hygiene: Total assistance;Bed level Toileting - Clothing Manipulation Details (indicate cue type and reason): total A for posterior pericare from bed level     Functional mobility during ADLs: Total assistance;+2 for physical assistance (bed mobility only) General ADL Comments: pt rather self limiting this session, pt required total A +2 for bed mobiilty and at least MOD A to sit EOB, attempted transfer with stedy however pt not assisting     Vision       Perception     Praxis      Cognition Arousal/Alertness: Lethargic;Awake/alert (moments of wakefulness and moments of lethargy) Behavior During Therapy: Flat affect Overall Cognitive Status: Impaired/Different from baseline Area of Impairment: Attention;Following commands;Problem solving;Safety/judgement;Awareness                   Current Attention Level: Alternating   Following Commands: Follows one step commands with increased time Safety/Judgement: Decreased awareness of deficits Awareness: Emergent Problem Solving: Slow processing;Decreased initiation;Requires verbal cues;Requires tactile cues General Comments: pt unable to verbalize needs but does nod appropriately durng session, pt seems to be self limiting this session declining to attempt to sit<>stand from EOB        Exercises     Shoulder Instructions       General Comments VSS, pts daughter present during session very helpful and supportive    Pertinent Vitals/ Pain       Pain Assessment: Faces Faces Pain Scale: Hurts little more Pain Location: general discomfort with mobility however unable to verbalize location of pain Pain Descriptors / Indicators: Discomfort;Grimacing;Guarding Pain Intervention(s): Limited activity within patient's tolerance;Monitored during session;Repositioned  Home Living                                          Prior Functioning/Environment              Frequency  Min  2X/week        Progress Toward Goals  OT Goals(current goals can now be found in the care plan section)  Progress towards OT goals: Not progressing toward goals - comment (self limiting this session)  Acute Rehab OT Goals Patient Stated Goal: did not state but pt's wife stated goal to improve OT Goal Formulation: With patient Time For Goal Achievement: 05/18/20 Potential to Achieve Goals: [place] Discharge plan remains appropriate;Frequency remains appropriate    Co-evaluation      Reason for Co-Treatment: Complexity of the patient's impairments (multi-system involvement);For patient/therapist safety;To address functional/ADL transfers   OT goals addressed during session: ADL's and self-care;Other (comment) (sitting balance, attempted transfers)      AM-PAC OT "6 Clicks" Daily Activity     Outcome Measure   Help from another person eating meals?: Total (NPO) Help from another person taking care of personal grooming?: Total Help from another person toileting, which includes using toliet, bedpan, or urinal?: Total Help from another person bathing (including washing, rinsing, drying)?: Total Help from another person to put on and taking off regular  upper body clothing?: Total Help from another person to put on and taking off regular lower body clothing?: Total 6 Click Score: 6    End of Session Equipment Utilized During Treatment: Gait belt;Other (comment) (stedy)  OT Visit Diagnosis: Unsteadiness on feet (R26.81);Other abnormalities of gait and mobility (R26.89);Muscle weakness (generalized) (M62.81);Pain Pain - Right/Left: Right Pain - part of body: Shoulder;Arm;Hand   Activity Tolerance Patient limited by lethargy;Patient limited by fatigue   Patient Left in bed;with call bell/phone within reach;with bed alarm set;with family/visitor present   Nurse Communication Mobility status;Other (comment) (unable to attempt OOB)        Time: [id number] OT Time  Calculation (min): 38 min  Charges: OT General Charges $OT Visit: 1 Visit OT Treatments $Therapeutic Activity: 8-22 mins  John Lambert., COTA/L Acute Rehabilitation Services [place]    [name] 05/10/2020, 1:21 PM

## 2020-05-10 NOTE — Progress Notes (Signed)
Dr. Marland Mcalpine is ok to transition to per tube cephalexin to complete 10d of therapy.  Ulyses Southward, PharmD, BCIDP, AAHIVP, CPP Infectious Disease Pharmacist 05/10/2020 10:37 AM

## 2020-05-11 DIAGNOSIS — R531 Weakness: Secondary | ICD-10-CM | POA: Diagnosis not present

## 2020-05-11 LAB — COMPREHENSIVE METABOLIC PANEL
ALT: 72 U/L — ABNORMAL HIGH (ref 0–44)
AST: 44 U/L — ABNORMAL HIGH (ref 15–41)
Albumin: 2.7 g/dL — ABNORMAL LOW (ref 3.5–5.0)
Alkaline Phosphatase: 44 U/L (ref 38–126)
Anion gap: 7 (ref 5–15)
BUN: 29 mg/dL — ABNORMAL HIGH (ref 8–23)
CO2: 24 mmol/L (ref 22–32)
Calcium: 8.2 mg/dL — ABNORMAL LOW (ref 8.9–10.3)
Chloride: 105 mmol/L (ref 98–111)
Creatinine, Ser: 0.79 mg/dL (ref 0.61–1.24)
GFR, Estimated: >60 mL/min (ref >60)
Glucose, Bld: 133 mg/dL — ABNORMAL HIGH (ref 70–99)
Potassium: 4.6 mmol/L (ref 3.5–5.1)
Sodium: 136 mmol/L (ref 135–145)
Total Bilirubin: 1 mg/dL (ref 0.3–1.2)
Total Protein: 5.8 g/dL — ABNORMAL LOW (ref 6.5–8.1)

## 2020-05-11 LAB — CBC WITH DIFFERENTIAL/PLATELET
Abs Immature Granulocytes: 0.89 10*3/uL — ABNORMAL HIGH (ref 0.00–0.07)
Basophils Absolute: 0.1 10*3/uL (ref 0.0–0.1)
Basophils Relative: 0 %
Eosinophils Absolute: 0.5 10*3/uL (ref 0.0–0.5)
Eosinophils Relative: 4 %
HCT: 34.7 % — ABNORMAL LOW (ref 39.0–52.0)
Hemoglobin: 11 g/dL — ABNORMAL LOW (ref 13.0–17.0)
Immature Granulocytes: 8 %
Lymphocytes Relative: 11 %
Lymphs Abs: 1.2 10*3/uL (ref 0.7–4.0)
MCH: 29.6 pg (ref 26.0–34.0)
MCHC: 31.7 g/dL (ref 30.0–36.0)
MCV: 93.5 fL (ref 80.0–100.0)
Monocytes Absolute: 0.8 10*3/uL (ref 0.1–1.0)
Monocytes Relative: 7 %
Neutro Abs: 7.8 10*3/uL — ABNORMAL HIGH (ref 1.7–7.7)
Neutrophils Relative %: 70 %
Platelets: 463 10*3/uL — ABNORMAL HIGH (ref 150–400)
RBC: 3.71 MIL/uL — ABNORMAL LOW (ref 4.22–5.81)
RDW: 13.6 % (ref 11.5–15.5)
WBC: 11.3 10*3/uL — ABNORMAL HIGH (ref 4.0–10.5)
nRBC: 0 % (ref 0.0–0.2)

## 2020-05-11 LAB — PROTIME-INR
INR: 1.2 (ref 0.8–1.2)
Prothrombin Time: 15 seconds (ref 11.4–15.2)

## 2020-05-11 LAB — CULTURE, BLOOD (ROUTINE X 2)
Culture: NO GROWTH
Culture: NO GROWTH
Special Requests: ADEQUATE
Special Requests: ADEQUATE

## 2020-05-11 LAB — PHOSPHORUS: Phosphorus: 3.1 mg/dL (ref 2.5–4.6)

## 2020-05-11 LAB — HEPARIN LEVEL (UNFRACTIONATED)
Heparin Unfractionated: 0.62 IU/mL (ref 0.30–0.70)
Heparin Unfractionated: 0.55 IU/mL (ref 0.30–0.70)

## 2020-05-11 LAB — GLUCOSE, CAPILLARY
Glucose-Capillary: 121 mg/dL — ABNORMAL HIGH (ref 70–99)
Glucose-Capillary: 131 mg/dL — ABNORMAL HIGH (ref 70–99)
Glucose-Capillary: 125 mg/dL — ABNORMAL HIGH (ref 70–99)
Glucose-Capillary: 116 mg/dL — ABNORMAL HIGH (ref 70–99)
Glucose-Capillary: 124 mg/dL — ABNORMAL HIGH (ref 70–99)
Glucose-Capillary: 84 mg/dL (ref 70–99)

## 2020-05-11 LAB — MAGNESIUM: Magnesium: 2 mg/dL (ref 1.7–2.4)

## 2020-05-11 MED ORDER — CHLORHEXIDINE GLUCONATE CLOTH 2 % EX PADS
6.0000 | MEDICATED_PAD | Freq: Once | CUTANEOUS | Status: AC
  Administered 2020-05-11: 6 via TOPICAL

## 2020-05-11 MED ORDER — DEXTROSE-NACL 5-0.9 % IV SOLN: INTRAVENOUS | Status: DC

## 2020-05-11 NOTE — Progress Notes (Signed)
  Speech Language Pathology Treatment: Dysphagia  Patient Details Name: John Lambert MRN: [id number] DOB: [birth year] Today's Date: 05/11/2020 Time: 9211-9417 SLP Time Calculation (min) (ACUTE ONLY): 20 min  Assessment / Plan / Recommendation Clinical Impression  Pt was sleepy but participatory; his wife was at the bedside and dtr John Lambert was on speaker phone.  Plan remains to proceed with PEG tomorrow - we discussed the benefits of this plan to allow consistent nutrition, the comfort in having the cortrak removed, and the opportunity for John Lambert to progress at his own pace with swallow recovery.  Today he had several ice chips, demonstrating active mastication, intermittent throat clearing.  Trials of four 1/2 teaspoon boluses of applesauce were swallowed without overt s/s of aspiration. His two prior MBS studies were reviewed.  Recommend resuming occasional bites of applesauce/pudding after oral care; allow 4-6 ice chips/hour.  We discussed potentially waiting two weeks to repeat the MBS as an OP after John Lambert is D/Cd to [facility] rehab for therapy. Family verbalized agreement.  Augmentative communication board provided to assist with communication at family request.    SLP will continue to follow.      HPI HPI: Pt is an 80 y.o. 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 He also has pain in left upper leg/hip region, and R foot drag; Imaging notable for Numerous scattered punctate acute infarctions in the left  posterior frontal region consistent with micro embolic infarctions  which could either be in the anterior or middle cerebral artery  territory. Punctate acute infarction also affecting the splenium of  the corpus callosum. Pt was seen by SLP on 12/17 for a BSE and SLE with no subsequent need for SLP services. Left carotid TCAR  procedure with stent placement conducted on 12/22. Following the procedure he was found to be aphasic unable to speak with left gaze deviation and right hemiparesis. Emergent CTA showed patent left carotid stent with good flow in the terminal left ICA as well as middle cerebral artery in the M1 segment. There appeared to be decreased flow in one of the M2 branches by neurology. SLP was consulted due to pt failing the [facility] Screen, but a failed swallow screen has not been documented in EMR. EEG 12/22: No seizures. MRI 12/23: New areas of acute infarction are present scattered within the  left basal ganglia and radiating white matter tracts and within the  left frontoparietal cortical and subcortical brain.EEG 12/30: cortical dysfunction in left hemisphere, maximal left fronto-temporal region likely secondary to underlying stroke/structural abnormality. No seizures      SLP Plan  Continue with current plan of care       Recommendations  Diet recommendations: NPO Medication Administration: Via alternative means Supervision: Trained caregiver to feed patient Compensations: Slow rate;Small sips/bites                Oral Care Recommendations: Oral care QID SLP Visit Diagnosis: Dysphagia, oropharyngeal phase (R13.12) Plan: Continue with current plan of care       GO                Blenda Mounts Laurice 05/11/2020, 11:52 AM  Marchelle Folks L. Samson Frederic, MA CCC/SLP Acute Rehabilitation Services Office number [phone] Pager [phone]

## 2020-05-11 NOTE — Progress Notes (Signed)
ANTICOAGULATION CONSULT NOTE  Pharmacy Consult for Warfarin >> heparin Indication: on PTA warfarin for hx of DVT/PE, new acute stroke, new Afib  Patient Measurements: Height: 6\' 2"  (188 cm) Weight: 78.8 kg (173 lb 11.6 oz) IBW/kg (Calculated) : 82.2  Heparin weight: 83.1  Vital Signs: Temp: 97.8 F (36.6 C) (01/05 1643) Temp Source: Oral (01/05 1643) BP: 110/62 (01/05 1643) Pulse Rate: 74 (01/05 1643)  Labs: Recent Labs    05/09/20 0957 05/09/20 2012 05/10/20 0603 05/10/20 2050 05/11/20 0643 05/11/20 1227 05/11/20 1628  HGB 11.8*  --  11.6*  --  11.0*  --   --   HCT 35.9*  --  34.8*  --  34.7*  --   --   PLT 392  --  448*  --  463*  --   --   LABPROT  --   --   --   --   --  15.0  --   INR  --   --   --   --   --  1.2  --   HEPARINUNFRC 0.18*   < > 0.22* 0.24* 0.62  --  0.55  CREATININE 0.77  --  0.80  --  0.79  --   --    < > = values in this interval not displayed.    Estimated Creatinine Clearance: 80.7 mL/min (by C-G formula based on SCr of 0.79 mg/dL).  Assessment: 80 yo male with L symptomatic carotid stenosis.  S/p L carotid TCAR procedure w/ stenting that was complicated by postprocedure stroke.  MRI brain shows patchy L basal ganglia and L frontoparietal cortical and subcortical infarcts (new). No hemorrhagic transformation. New stroke and new onset atrial fibrillation. Previous reported hematuria but appears to have resolved.  PTA warfarin dose: 2.5mg  daily except 1.25mg  on Mondays.  1/2: IR consulted for PEG tube placement due to multiple failed swallow study tests.  Procedure planned for Thursday, 1/6 to allow adequate wash-out from warfarin.  Warfarin will be HELD starting today. Last dose of warfarin 05/07/20.  Pharmacy consulted to initiate heparin drip while warfarin held.   Heparin level supratherapeutic at 0.55 on 1650 units/hr. No bleeding noted. Plan for PEG in AM.   Goal of Therapy:   Heparin level 0.3-0.5 units/ml (more narrow goal due to recent  stroke) INR goal 2-3  Monitor platelets by anticoagulation protocol: Yes    Plan: -Decrease heparin drip to 1600 units/hr -Daily heparin level, CBC -Monitor for s/sx of bleeding   Thank you for involving pharmacy in this patient's care.  07/05/20, PharmD, BCPS Clinical Pharmacist Clinical phone for 05/11/2020 until 10p is [phone] 05/11/2020 5:12 PM  **Pharmacist phone directory can be found on [email or web address] listed under [facility]**

## 2020-05-11 NOTE — Progress Notes (Signed)
PROGRESS NOTE    John Lambert  [id number] DOB: Aug 18, 1940 DOA: 04/19/2020 PCP: Harlan Stains, MD     Brief Narrative:  MARQUARIUS OBIER is an 80 y.o.malewith PMH significant for hypertension, melanoma, neurofibroma, clotting disorder, history of DVT and PE on chronic coumadinwhopresented to the ED on 12/14 with c/o bilateral lower extremity weakness and difficulty ambulating associated with pain in the left hip region (has a history of neurofibroma tumor removal from his spine about 4 years ago and melanoma removal from right calf about 30 years ago).   MRI brain obtainednoted numerousscattered punctate acute infarctions in the left posterior frontal region consistent with micro embolic infarctions which could either be in the anterior or middle cerebral arteryterritory. Punctate acute infarction also affecting the splenium of the corpus callosum.Extensive chronic small-vessel ischemic changes elsewherethroughout the brain.Small old left parietal vertex cortical and subcorticalInfarctions.  MRI lumbar spine showedchronicarachnoiditis with adhesions in the thecal sac and distorted cauda equina nerve roots from L2-L3 to the sacrum, but no evidence ofrecurrent spinal tumor.Possible right L5/S1 radiculitis after noting L4/5 multifactorial mild to moderate spinal stenosis. Vascular surgery was consulted and took patient on 12/22 for a left transcarotid artery stenting. Patient found to have 80% carotid stenosis and then following procedure down to less than 10% with stent. However, following surgery patient noted to have difficulty moving right side, expressive aphasia, and left gaze deviation and code stroke immediately called. Patient taken straight to CT scan. No new acute infarction noted. Patient underwent emergent left carotid diagnostic cerebral catheter angiogram with left MCA embolism noted from previous, but no acute findings. No intervention planned. EEG noted cortical  dysfunction left hemisphere, but no seizures or epileptiform discharges seen throughout recording.  He was transferred to the stroke service on 04/27/2020 and then transferred out to the [facility] service on 04/29/2020.  Currently he continues to remain weak and his right upper arm is flaccid.  Now having gross hematuria as he has been placed on aspirin Plavix as well as heparin and Coumadin.  Heparin drip has now been stopped given that his Coumadin level therapeutic. Coumadin held given his gross hematuria. Case was discussed with urology Dr. Alexis Frock who recommends obtaining baseline imaging with CT of the abdomen pelvis with and without contrast for hematuria.  Unfortunately imaging was not able to be obtained given his worsened renal function so this was done without contrast.  This showed possible pyelonephritis and pancreatitis was essentially ruled out given his normal lipase level.  Foley catheter was inserted and urine grew out Klebsiella. Renal function is improving now with Foley catheter drainage and he is diuresing fairly well.  Patient WBC is trending down as well as his renal function.  Coumadin has been resumed.  Due to somnolence, patient underwent repeat MRI which was unchanged and the EEG which still showed encephalopathy which was improved. Scrotal ultrasound obtained due to scrotal edema - likely this is in the setting of his anasarca and extreme volume overload. Urology was consulted and recommend outpatient ultrasound in several months to verify stability. Urology recommends and trial of void prior to discharge for having outpatient trial void in office.   Due to continued poor PO intake and dysphagia, PEG tube was considered and planned for 1/6.   New events last 24 hours / Subjective: Wife is at bedside and daughter is on speaker phone during my examination.  Wife is recording our conversation during examination on handheld microphone.  She states that patient had a rough  night,  continues to have cough and restlessness.  They are asking about a repeat MBS.  SLP to follow-up today.  Assessment & Plan:   Principal Problem:   Weakness Active Problems:   Anticoagulated on warfarin   Impaired ambulation   Fall at home, initial encounter   Essential hypertension   Blood clotting disorder (HCC)   Cerebral embolism with cerebral infarction   Stroke [facility](HCC)   Middle cerebral artery embolism, left   Acute CVA -Status post left carotid stent placement complicated by postop stroke.  Aphasic with right hemiplegia -Follow-up with vascular surgery Dr. Myra GianottiBrabham  -Follow-up with neurology in 4 weeks  -Aspirin, Plavix, Coumadin for 1 month then going to Coumadin and aspirin alone.  Coumadin currently on hold due to PEG placement pending 1/6   Dysphagia -Remains on cortrak tube feedings, planning for PEG placement 1/6 -SLP re-consulted   Dyspnea -Imaging consistent with scarring, atelectasis -On room air without distress   AKI  -Resolved status post Foley placement  Acute urinary retention -Urology recommending trial of void prior to discharge versus as an outpatient -Continue Proscar  Klebsiella UTI -Rocephin --> Keflex  Hematuria -Seems to be resolved, yellow urine in Foley catheter bag this morning  Scrotal edema, ecchymosis -Appreciate urology -Monitor  History of blood clotting disorder, PE, DVT -Coumadin when able to resume post PEG placement  Hyperlipidemia -Continue pravastatin  Paroxysmal atrial fibrillation -Coumadin when able to resume post PEG placement -Continue Coreg  Essential hypertension -Continue Coreg   DVT prophylaxis: Coumadin on hold pending PEG placement 1/6. IV heparin   Code Status: Full code Family Communication: Wife at bedside, daughter on speaker phone Disposition Plan:  Status is: Inpatient  Remains inpatient appropriate because:Unsafe d/c plan and IV treatments appropriate due to intensity of illness or inability  to take PO   Dispo: The patient is from: Home              Anticipated d/c is to: SNF              Anticipated d/c date is: 3 days              Patient currently is not medically stable to d/c.  PEG placement pending 1/6    Antimicrobials:  Anti-infectives (From admission, onward)   Start     Dose/Rate Route Frequency Ordered Stop   05/11/20 0600  cephALEXin (KEFLEX) 250 MG/5ML suspension 500 mg        500 mg Per Tube Every 8 hours 05/10/20 1036 05/14/20 0559   05/04/20 0945  azithromycin (ZITHROMAX) 500 mg in sodium chloride 0.9 % 250 mL IVPB  Status:  Discontinued        500 mg 250 mL/hr over 60 Minutes Intravenous Every 24 hours 05/04/20 0859 05/05/20 0956   05/04/20 0900  cefTRIAXone (ROCEPHIN) 1 g in sodium chloride 0.9 % 100 mL IVPB  Status:  Discontinued        1 g 200 mL/hr over 30 Minutes Intravenous Every 24 hours 05/04/20 0803 05/10/20 1036   04/27/20 0600  ceFAZolin (ANCEF) IVPB 2g/100 mL premix  Status:  Discontinued       Note to Pharmacy: Send with pt to OR   2 g 200 mL/hr over 30 Minutes Intravenous On call 04/26/20 0805 04/26/20 2018   04/27/20 0600  ceFAZolin (ANCEF) IVPB 2g/100 mL premix        2 g 200 mL/hr over 30 Minutes Intravenous 30 min pre-op 04/26/20 2013 04/27/20 [id number]  04/27/20 0000  ceFAZolin (ANCEF) IVPB 1 g/50 mL premix  Status:  Discontinued       Note to Pharmacy: Send with pt to OR   1 g 100 mL/hr over 30 Minutes Intravenous On call 04/26/20 0800 04/26/20 0805        Objective: Vitals:   05/11/20 0222 05/11/20 0747 05/11/20 0800 05/11/20 1235  BP: 114/73 (!) 108/58  107/68  Pulse: 76 75  77  Resp: 16 18 16 16   Temp: 98.5 F (36.9 C) 98 F (36.7 C)  97.8 F (36.6 C)  TempSrc: Axillary Oral  Oral  SpO2: 96% 95% 94% 97%  Weight:      Height:        Intake/Output Summary (Last 24 hours) at 05/11/2020 1447 Last data filed at 05/11/2020 1241 Gross per 24 hour  Intake 500.01 ml  Output 2525 ml  Net -2024.99 ml   Filed Weights    05/08/20 0402 05/09/20 0500 05/10/20 0324  Weight: 83.1 kg 81.5 kg 78.8 kg    Examination:  General exam: Appears calm, weak appearing  Respiratory system: Clear to auscultation anteriorly without rhonchi or wheeze. Respiratory effort normal. No respiratory distress.  Cardiovascular system: S1 & S2 heard, RRR. No murmurs. No pedal edema. Gastrointestinal system: Abdomen is nondistended, soft and nontender. Normal bowel sounds heard. +Cortrak  Central nervous system: Alert  Extremities: Symmetric in appearance   Data Reviewed: I have personally reviewed following labs and imaging studies  CBC: Recent Labs  Lab 05/07/20 0207 05/08/20 0350 05/09/20 0957 05/10/20 0603 05/11/20 0643  WBC 10.7* 9.9 11.4* 12.7* 11.3*  NEUTROABS 8.1* 7.4 8.6* 9.4* 7.8*  HGB 11.3* 11.0* 11.8* 11.6* 11.0*  HCT 35.6* 33.8* 35.9* 34.8* 34.7*  MCV 93.2 93.6 92.5 92.1 93.5  PLT 392 359 392 448* [id number]*   Basic Metabolic Panel: Recent Labs  Lab 05/07/20 0207 05/08/20 0350 05/09/20 0957 05/10/20 0603 05/11/20 0643  NA 146* 144 136 136 136  K 4.7 4.5 4.6 4.5 4.6  CL 111 108 102 102 105  CO2 25 26 23 25 24   GLUCOSE 114* 132* 121* 119* 133*  BUN 38* 36* 32* 31* 29*  CREATININE 0.97 0.99 0.77 0.80 0.79  CALCIUM 8.5* 8.5* 8.3* 8.2* 8.2*  MG 2.1 2.0 2.0 2.0 2.0  PHOS 4.1 3.6 3.3 3.3 3.1   GFR: Estimated Creatinine Clearance: 80.7 mL/min (by C-G formula based on SCr of 0.79 mg/dL). Liver Function Tests: Recent Labs  Lab 05/07/20 0207 05/08/20 0350 05/09/20 0957 05/10/20 0603 05/11/20 0643  AST 100* 64* 56* 47* 44*  ALT 97* 90* 87* 79* 72*  ALKPHOS 37* 37* 40 41 44  BILITOT 1.3* 1.1 0.7 0.7 1.0  PROT 5.6* 5.8* 5.8* 5.7* 5.8*  ALBUMIN 2.4* 2.8* 2.7* 2.7* 2.7*   No results for input(s): LIPASE, AMYLASE in the last 168 hours. No results for input(s): AMMONIA in the last 168 hours. Coagulation Profile: Recent Labs  Lab 05/05/20 0302 05/06/20 0400 05/07/20 0207 05/08/20 0350 05/11/20 1227   INR 1.6* 1.5* 1.5* 1.4* 1.2   Cardiac Enzymes: No results for input(s): CKTOTAL, CKMB, CKMBINDEX, TROPONINI in the last 168 hours. BNP (last 3 results) No results for input(s): PROBNP in the last 8760 hours. HbA1C: No results for input(s): HGBA1C in the last 72 hours. CBG: Recent Labs  Lab 05/10/20 1945 05/10/20 2333 05/11/20 0324 05/11/20 0747 05/11/20 1238  GLUCAP 108* 102* 121* 131* 125*   Lipid Profile: No results for input(s): CHOL, HDL, LDLCALC, TRIG, CHOLHDL, LDLDIRECT  in the last 72 hours. Thyroid Function Tests: No results for input(s): TSH, T4TOTAL, FREET4, T3FREE, THYROIDAB in the last 72 hours. Anemia Panel: No results for input(s): VITAMINB12, FOLATE, FERRITIN, TIBC, IRON, RETICCTPCT in the last 72 hours. Sepsis Labs: No results for input(s): PROCALCITON, LATICACIDVEN in the last 168 hours.  Recent Results (from the past 240 hour(s))  Culture, Urine     Status: Abnormal   Collection Time: 05/03/20 10:42 AM   Specimen: Urine, Random  Result Value Ref Range Status   Specimen Description URINE, RANDOM  Final   Special Requests   Final    NONE Performed at [facility], [address], [place], [place] [address]    Culture >=100,000 COLONIES/mL KLEBSIELLA OXYTOCA (A)  Final   Report Status 05/05/2020 FINAL  Final   Organism ID, Bacteria KLEBSIELLA OXYTOCA (A)  Final      Susceptibility   Klebsiella oxytoca - MIC*    AMPICILLIN RESISTANT Resistant     CEFAZOLIN 8 SENSITIVE Sensitive     CEFEPIME <=0.12 SENSITIVE Sensitive     CEFTRIAXONE <=0.25 SENSITIVE Sensitive     CIPROFLOXACIN <=0.25 SENSITIVE Sensitive     GENTAMICIN <=1 SENSITIVE Sensitive     IMIPENEM <=0.25 SENSITIVE Sensitive     NITROFURANTOIN <=16 SENSITIVE Sensitive     TRIMETH/SULFA <=20 SENSITIVE Sensitive     AMPICILLIN/SULBACTAM 4 SENSITIVE Sensitive     PIP/TAZO <=4 SENSITIVE Sensitive     * >=100,000 COLONIES/mL KLEBSIELLA OXYTOCA  Culture, blood (routine x 2)     Status:  Abnormal   Collection Time: 05/04/20  4:46 PM   Specimen: BLOOD  Result Value Ref Range Status   Specimen Description BLOOD BLOOD LEFT FOREARM  Final   Special Requests   Final    BOTTLES DRAWN AEROBIC ONLY Blood Culture adequate volume   Culture  Setup Time   Final    AEROBIC BOTTLE ONLY GRAM POSITIVE COCCI Organism ID to follow CRITICAL RESULT CALLED TO, READ BACK BY AND VERIFIED WITH: J LEDFORD [facility] 05/06/20 0615 JDW    Culture (A)  Final    STAPHYLOCOCCUS EPIDERMIDIS THE SIGNIFICANCE OF ISOLATING THIS ORGANISM FROM A SINGLE SET OF BLOOD CULTURES WHEN MULTIPLE SETS ARE DRAWN IS UNCERTAIN. PLEASE NOTIFY THE MICROBIOLOGY DEPARTMENT WITHIN ONE WEEK IF SPECIATION AND SENSITIVITIES ARE REQUIRED. Performed at [facility], [address] [place], [place] [address]    Report Status 05/07/2020 FINAL  Final  Blood Culture ID Panel (Reflexed)     Status: Abnormal   Collection Time: 05/04/20  4:46 PM  Result Value Ref Range Status   Enterococcus faecalis NOT DETECTED NOT DETECTED Final   Enterococcus Faecium NOT DETECTED NOT DETECTED Final   Listeria monocytogenes NOT DETECTED NOT DETECTED Final   Staphylococcus species DETECTED (A) NOT DETECTED Final    Comment: CRITICAL RESULT CALLED TO, READ BACK BY AND VERIFIED WITH: J LEDFORD [facility] 05/06/20 0615 JDW    Staphylococcus aureus (BCID) NOT DETECTED NOT DETECTED Final   Staphylococcus epidermidis DETECTED (A) NOT DETECTED Final    Comment: Methicillin (oxacillin) resistant coagulase negative staphylococcus. Possible blood culture contaminant (unless isolated from more than one blood culture draw or clinical case suggests pathogenicity). No antibiotic treatment is indicated for blood  culture contaminants. CRITICAL RESULT CALLED TO, READ BACK BY AND VERIFIED WITH: J LEDFORD [facility] 05/06/20 0615 JDW    Staphylococcus lugdunensis NOT DETECTED NOT DETECTED Final   Streptococcus species NOT DETECTED NOT DETECTED Final   Streptococcus  agalactiae  NOT DETECTED NOT DETECTED Final   Streptococcus pneumoniae NOT DETECTED NOT DETECTED Final   Streptococcus pyogenes NOT DETECTED NOT DETECTED Final   A.calcoaceticus-baumannii NOT DETECTED NOT DETECTED Final   Bacteroides fragilis NOT DETECTED NOT DETECTED Final   Enterobacterales NOT DETECTED NOT DETECTED Final   Enterobacter cloacae complex NOT DETECTED NOT DETECTED Final   Escherichia coli NOT DETECTED NOT DETECTED Final   Klebsiella aerogenes NOT DETECTED NOT DETECTED Final   Klebsiella oxytoca NOT DETECTED NOT DETECTED Final   Klebsiella pneumoniae NOT DETECTED NOT DETECTED Final   Proteus species NOT DETECTED NOT DETECTED Final   Salmonella species NOT DETECTED NOT DETECTED Final   Serratia marcescens NOT DETECTED NOT DETECTED Final   Haemophilus influenzae NOT DETECTED NOT DETECTED Final   Neisseria meningitidis NOT DETECTED NOT DETECTED Final   Pseudomonas aeruginosa NOT DETECTED NOT DETECTED Final   Stenotrophomonas maltophilia NOT DETECTED NOT DETECTED Final   Candida albicans NOT DETECTED NOT DETECTED Final   Candida auris NOT DETECTED NOT DETECTED Final   Candida glabrata NOT DETECTED NOT DETECTED Final   Candida krusei NOT DETECTED NOT DETECTED Final   Candida parapsilosis NOT DETECTED NOT DETECTED Final   Candida tropicalis NOT DETECTED NOT DETECTED Final   Cryptococcus neoformans/gattii NOT DETECTED NOT DETECTED Final   Methicillin resistance mecA/C DETECTED (A) NOT DETECTED Final    Comment: CRITICAL RESULT CALLED TO, READ BACK BY AND VERIFIED WITHLenna Sciara [facility] 05/06/20 0615 JDW Performed at [facility], [address] [place], [place] [address]   Culture, blood (routine x 2)     Status: None   Collection Time: 05/04/20  4:52 PM   Specimen: BLOOD LEFT HAND  Result Value Ref Range Status   Specimen Description BLOOD LEFT HAND  Final   Special Requests   Final    BOTTLES DRAWN AEROBIC ONLY Blood Culture adequate volume   Culture   Final     NO GROWTH 5 DAYS Performed at [facility], [place] [address], [place], [place] [address]    Report Status 05/09/2020 FINAL  Final  Culture, blood (routine x 2)     Status: None   Collection Time: 05/06/20  9:00 AM   Specimen: BLOOD  Result Value Ref Range Status   Specimen Description BLOOD RIGHT ANTECUBITAL  Final   Special Requests   Final    BOTTLES DRAWN AEROBIC AND ANAEROBIC Blood Culture adequate volume   Culture   Final    NO GROWTH 5 DAYS Performed at [facility], [place] [address], [place], [place] [address]    Report Status 05/11/2020 FINAL  Final  Culture, blood (routine x 2)     Status: None   Collection Time: 05/06/20  9:03 AM   Specimen: BLOOD RIGHT ARM  Result Value Ref Range Status   Specimen Description BLOOD RIGHT ARM  Final   Special Requests   Final    BOTTLES DRAWN AEROBIC AND ANAEROBIC Blood Culture adequate volume   Culture   Final    NO GROWTH 5 DAYS Performed at [facility], [place] [address], [place], [place] [address]    Report Status 05/11/2020 FINAL  Final      Radiology Studies: No results found.    Scheduled Meds: . aspirin  81 mg Per Tube Daily  . carvedilol  12.5 mg Per Tube BID WC  . cephALEXin  500 mg Per Tube Q8H  . Chlorhexidine Gluconate Cloth  6 each Topical Daily  .  clopidogrel  75 mg Per Tube Daily  . feeding supplement (PROSource TF)  45 mL Per Tube BID  . finasteride  5 mg Oral Daily  . free water  175 mL Per Tube Q6H  . insulin aspart  0-9 Units Subcutaneous Q4H  . pravastatin  40 mg Per Tube Daily   Continuous Infusions: . feeding supplement (JEVITY 1.5 CAL/FIBER) 1,000 mL (05/11/20 0527)  . heparin 1,650 Units/hr (05/11/20 1050)     LOS: 20 days      Time spent: 35 minutes   Dessa Phi, DO [facility] 05/11/2020, 2:47 PM   Available via Epic secure chat 7am-7pm After these hours, please refer to coverage provider listed on [email or web address]

## 2020-05-11 NOTE — Progress Notes (Signed)
ANTICOAGULATION CONSULT NOTE  Pharmacy Consult for Warfarin >> heparin Indication: on PTA warfarin for hx of DVT/PE, new acute stroke, new Afib  Patient Measurements: Height: 6\' 2"  (188 cm) Weight: 78.8 kg (173 lb 11.6 oz) IBW/kg (Calculated) : 82.2  Heparin weight: 83.1  Vital Signs: Temp: 98 F (36.7 C) (01/05 0747) Temp Source: Oral (01/05 0747) BP: 108/58 (01/05 0747) Pulse Rate: 75 (01/05 0747)  Labs: Recent Labs    05/09/20 0957 05/09/20 2012 05/10/20 0603 05/10/20 2050 05/11/20 0643  HGB 11.8*  --  11.6*  --  11.0*  HCT 35.9*  --  34.8*  --  34.7*  PLT 392  --  448*  --  463*  HEPARINUNFRC 0.18*   < > 0.22* 0.24* 0.62  CREATININE 0.77  --  0.80  --  0.79   < > = values in this interval not displayed.    Estimated Creatinine Clearance: 80.7 mL/min (by C-G formula based on SCr of 0.79 mg/dL).  Assessment: 80 yo male with L symptomatic carotid stenosis.  S/p L carotid TCAR procedure w/ stenting that was complicated by postprocedure stroke.  MRI brain shows patchy L basal ganglia and L frontoparietal cortical and subcortical infarcts (new). No hemorrhagic transformation. New stroke and new onset atrial fibrillation. Previous reported hematuria but appears to have resolved.  PTA warfarin dose: 2.5mg  daily except 1.25mg  on Mondays.  1/2: IR consulted for PEG tube placement due to multiple failed swallow study tests.  Procedure planned for Thursday, 1/6 to allow adequate wash-out from warfarin.  Warfarin will be HELD starting today. Last dose of warfarin 05/07/20.  Pharmacy consulted to initiate heparin drip while warfarin held.   Heparin level increased to slightly supratherapeutic at 0.62 this AM. We will reduce it and recheck level. Hgb remains stable. Plan for PEG in AM.   Goal of Therapy:   Heparin level 0.3-0.5 units/ml (more narrow goal due to recent stroke) INR goal 2-3  Monitor platelets by anticoagulation protocol: Yes    Plan: -Decrease heparin 1650  units/h -Recheck heparin level in 8h   07/05/20, PharmD, [place], AAHIVP, CPP Infectious Disease Pharmacist 05/11/2020 8:17 AM

## 2020-05-12 ENCOUNTER — Inpatient Hospital Stay (HOSPITAL_COMMUNITY): Payer: Medicare Other

## 2020-05-12 DIAGNOSIS — R531 Weakness: Secondary | ICD-10-CM | POA: Diagnosis not present

## 2020-05-12 HISTORY — PX: IR GASTROSTOMY TUBE MOD SED: IMG625

## 2020-05-12 LAB — CBC
HCT: 36.1 % — ABNORMAL LOW (ref 39.0–52.0)
Hemoglobin: 11.4 g/dL — ABNORMAL LOW (ref 13.0–17.0)
MCH: 29.8 pg (ref 26.0–34.0)
MCHC: 31.6 g/dL (ref 30.0–36.0)
MCV: 94.3 fL (ref 80.0–100.0)
Platelets: 497 10*3/uL — ABNORMAL HIGH (ref 150–400)
RBC: 3.83 MIL/uL — ABNORMAL LOW (ref 4.22–5.81)
RDW: 13.7 % (ref 11.5–15.5)
WBC: 12 10*3/uL — ABNORMAL HIGH (ref 4.0–10.5)
nRBC: 0.2 % (ref 0.0–0.2)

## 2020-05-12 LAB — COMPREHENSIVE METABOLIC PANEL
ALT: 75 U/L — ABNORMAL HIGH (ref 0–44)
AST: 46 U/L — ABNORMAL HIGH (ref 15–41)
Albumin: 2.8 g/dL — ABNORMAL LOW (ref 3.5–5.0)
Alkaline Phosphatase: 48 U/L (ref 38–126)
Anion gap: 9 (ref 5–15)
BUN: 26 mg/dL — ABNORMAL HIGH (ref 8–23)
CO2: 25 mmol/L (ref 22–32)
Calcium: 8.4 mg/dL — ABNORMAL LOW (ref 8.9–10.3)
Chloride: 103 mmol/L (ref 98–111)
Creatinine, Ser: 0.78 mg/dL (ref 0.61–1.24)
GFR, Estimated: >60 mL/min (ref >60)
Glucose, Bld: 112 mg/dL — ABNORMAL HIGH (ref 70–99)
Potassium: 4.8 mmol/L (ref 3.5–5.1)
Sodium: 137 mmol/L (ref 135–145)
Total Bilirubin: 0.9 mg/dL (ref 0.3–1.2)
Total Protein: 6 g/dL — ABNORMAL LOW (ref 6.5–8.1)

## 2020-05-12 LAB — GLUCOSE, CAPILLARY
Glucose-Capillary: 106 mg/dL — ABNORMAL HIGH (ref 70–99)
Glucose-Capillary: 108 mg/dL — ABNORMAL HIGH (ref 70–99)
Glucose-Capillary: 138 mg/dL — ABNORMAL HIGH (ref 70–99)
Glucose-Capillary: 105 mg/dL — ABNORMAL HIGH (ref 70–99)
Glucose-Capillary: 126 mg/dL — ABNORMAL HIGH (ref 70–99)
Glucose-Capillary: 133 mg/dL — ABNORMAL HIGH (ref 70–99)

## 2020-05-12 LAB — HEPARIN LEVEL (UNFRACTIONATED)
Heparin Unfractionated: 0.74 IU/mL — ABNORMAL HIGH (ref 0.30–0.70)
Heparin Unfractionated: <0.1 IU/mL — ABNORMAL LOW (ref 0.30–0.70)
Heparin Unfractionated: 0.23 IU/mL — ABNORMAL LOW (ref 0.30–0.70)

## 2020-05-12 LAB — SURGICAL PCR SCREEN
MRSA, PCR: NEGATIVE
Staphylococcus aureus: POSITIVE — AB

## 2020-05-12 LAB — PROTIME-INR
INR: 1.1 (ref 0.8–1.2)
Prothrombin Time: 14.2 seconds (ref 11.4–15.2)

## 2020-05-12 IMAGING — XA IR PERC PLACEMENT GASTROSTOMY
3 series · 9 of 9 positions shown · non-contrast
Comparison: none

INDICATION: 79-year-old with cerebral infarcts and dysphagia.

[Series 1: fl (-) angio · 4 of 41 frames shown (1 of 3)]
[frame 3/41]
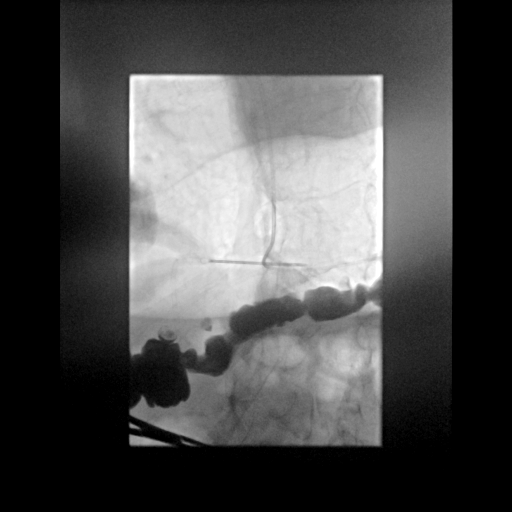
[frame 7/41]
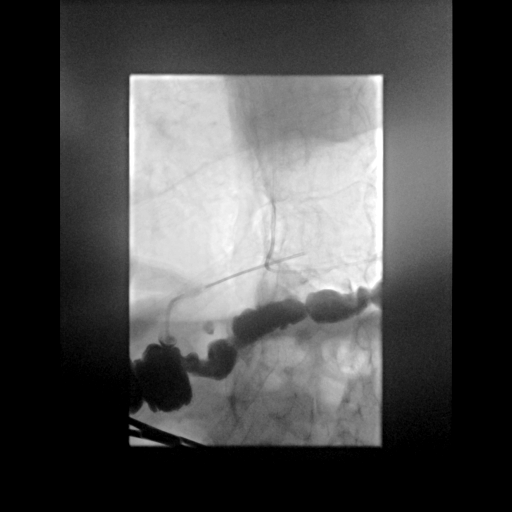
[frame 21/41]
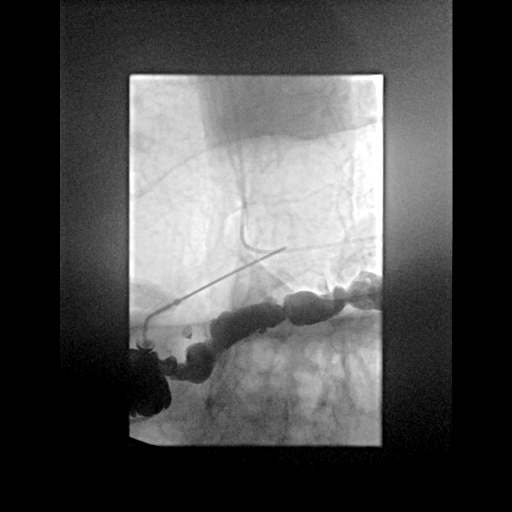
[frame 35/41]
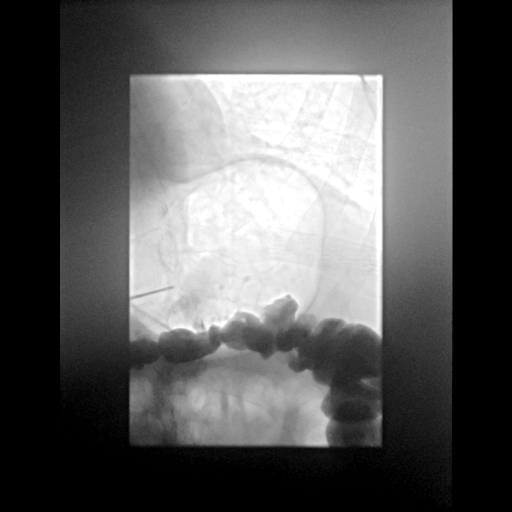

[Series 2: fl (-) angio · 1 of 1 slices shown (2 of 3)]
[im 1/1]
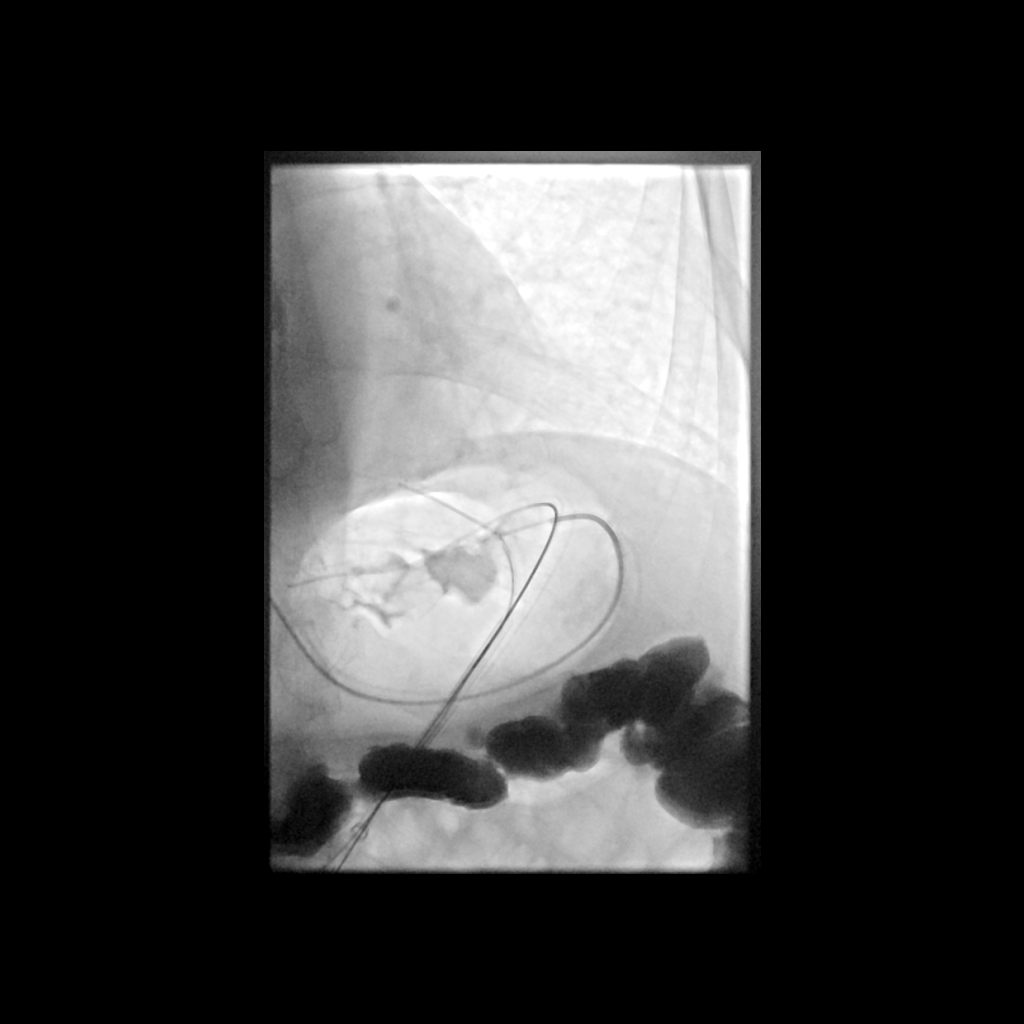

[Series 3: fl (-) angio · 4 of 25 frames shown (3 of 3)]
[frame 4/25]
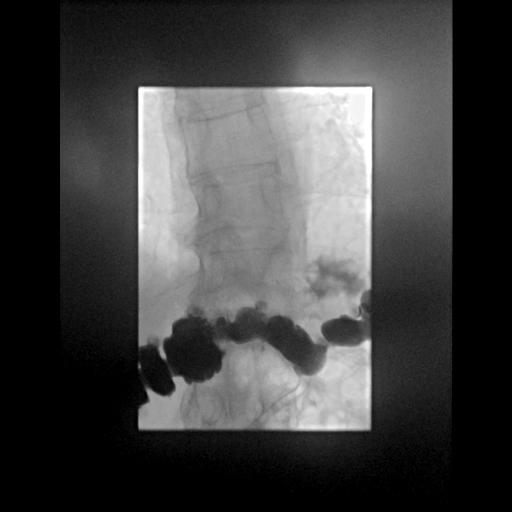
[frame 5/25]
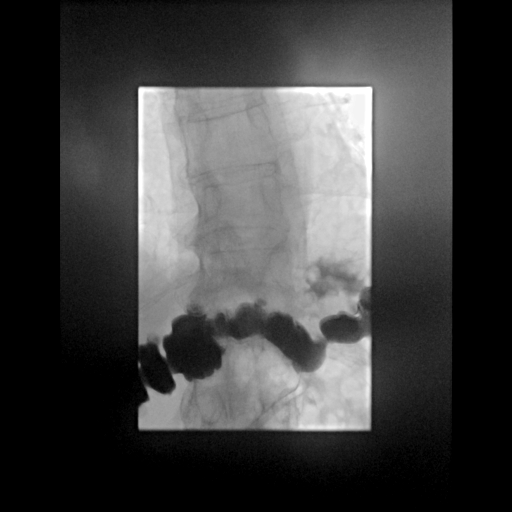
[frame 13/25]
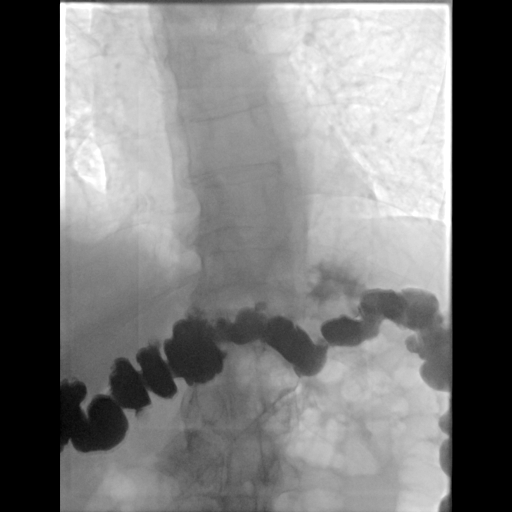
[frame 22/25]
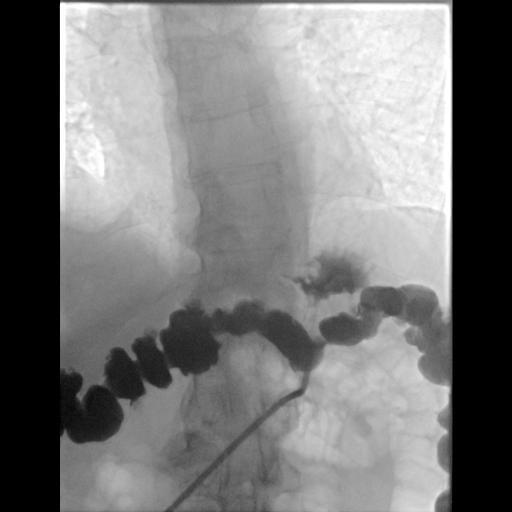

[9 of 9 positions shown; findings below may reference images not displayed]

EXAM:
PERCUTANEOUS GASTROSTOMY TUBE WITH FLUOROSCOPIC GUIDANCE

MEDICATIONS:
Ancef 2 g, glucagon 1 mg

ANESTHESIA/SEDATION:
Versed 2.0 mg IV; Fentanyl 50 mcg IV

Moderate Sedation Time:  18 minutes

The patient was continuously monitored during the procedure by the
interventional radiology nurse under my direct supervision.

FLUOROSCOPY TIME:  Fluoroscopy Time: 5 minutes, 42 seconds, 30 mGy

COMPLICATIONS:
None immediate.

PROCEDURE:
Informed consent was obtained for a percutaneous gastrostomy tube.
The patient was placed on the interventional table. Fluoroscopy
demonstrated oral contrast in the transverse colon. An orogastric
tube was placed with fluoroscopic guidance. The anterior abdomen was
prepped and draped in sterile fashion. Maximal barrier sterile
technique was utilized including caps, mask, sterile gowns, sterile
gloves, sterile drape, hand hygiene and skin antiseptic. Stomach was
inflated with air through the orogastric tube. The skin and
subcutaneous tissues were anesthetized with 1% lidocaine. A 17 gauge
needle was directed into the distended stomach with fluoroscopic
guidance. A wire was advanced into the stomach. A 9-French vascular
sheath was placed and the orogastric tube was snared using a
Gooseneck snare device. The orogastric tube and snare were pulled
out of the patient's mouth. The snare device was connected to a
20-French gastrostomy tube. The snare device and gastrostomy tube
were pulled through the patient's mouth and out the anterior
abdominal wall. The gastrostomy tube was cut to an appropriate
length. Contrast injection through gastrostomy tube confirmed
placement within the stomach. Fluoroscopic images were obtained for
documentation. The gastrostomy tube was flushed with normal saline.
IMPRESSION: Successful fluoroscopic guided percutaneous gastrostomy tube
placement.

## 2020-05-12 MED ORDER — CEFAZOLIN SODIUM-DEXTROSE 2-4 GM/100ML-% IV SOLN
INTRAVENOUS | Status: AC
  Filled 2020-05-12: qty 100

## 2020-05-12 MED ORDER — IOHEXOL 300 MG/ML  SOLN
50.0000 mL | Freq: Once | INTRAMUSCULAR | Status: AC | PRN
  Administered 2020-05-12: 20 mL

## 2020-05-12 MED ORDER — MIDAZOLAM HCL 2 MG/2ML IJ SOLN
INTRAMUSCULAR | Status: AC
  Filled 2020-05-12: qty 2

## 2020-05-12 MED ORDER — WARFARIN - PHARMACIST DOSING INPATIENT
Freq: Every day | Status: DC
  Administered 2020-05-17: 1

## 2020-05-12 MED ORDER — WARFARIN SODIUM 4 MG PO TABS
4.0000 mg | ORAL_TABLET | Freq: Once | ORAL | Status: DC
  Filled 2020-05-12: qty 1

## 2020-05-12 MED ORDER — CEFAZOLIN SODIUM-DEXTROSE 2-4 GM/100ML-% IV SOLN
2.0000 g | INTRAVENOUS | Status: AC
  Administered 2020-05-12: 2 g via INTRAVENOUS

## 2020-05-12 MED ORDER — FENTANYL CITRATE (PF) 100 MCG/2ML IJ SOLN
INTRAMUSCULAR | Status: AC | PRN
  Administered 2020-05-12: 50 ug via INTRAVENOUS

## 2020-05-12 MED ORDER — GLUCAGON HCL RDNA (DIAGNOSTIC) 1 MG IJ SOLR
INTRAMUSCULAR | Status: AC
  Filled 2020-05-12: qty 1

## 2020-05-12 MED ORDER — WARFARIN SODIUM 4 MG PO TABS
4.0000 mg | ORAL_TABLET | Freq: Once | ORAL | Status: AC
  Administered 2020-05-12: 4 mg
  Filled 2020-05-12: qty 1

## 2020-05-12 MED ORDER — FENTANYL CITRATE (PF) 100 MCG/2ML IJ SOLN
INTRAMUSCULAR | Status: AC
  Filled 2020-05-12: qty 2

## 2020-05-12 MED ORDER — MUPIROCIN 2 % EX OINT
1.0000 "application " | TOPICAL_OINTMENT | Freq: Two times a day (BID) | CUTANEOUS | Status: AC
  Administered 2020-05-12 – 2020-05-16 (×10): 1 via NASAL
  Filled 2020-05-12: qty 22

## 2020-05-12 MED ORDER — MIDAZOLAM HCL 2 MG/2ML IJ SOLN
INTRAMUSCULAR | Status: AC | PRN
  Administered 2020-05-12 (×2): 1 mg via INTRAVENOUS

## 2020-05-12 MED ORDER — HEPARIN (PORCINE) 25000 UT/250ML-% IV SOLN
1100.0000 [IU]/h | INTRAVENOUS | Status: DC
  Administered 2020-05-12 (×2): 1300 [IU]/h via INTRAVENOUS
  Filled 2020-05-12 (×4): qty 250

## 2020-05-12 MED ORDER — LIDOCAINE HCL 1 % IJ SOLN
INTRAMUSCULAR | Status: AC
  Filled 2020-05-12: qty 20

## 2020-05-12 MED ORDER — GLUCAGON HCL (RDNA) 1 MG IJ SOLR
INTRAMUSCULAR | Status: AC | PRN
  Administered 2020-05-12: 1 mg via INTRAVENOUS

## 2020-05-12 MED ORDER — LIDOCAINE HCL (PF) 1 % IJ SOLN
INTRAMUSCULAR | Status: AC | PRN
  Administered 2020-05-12: 10 mL

## 2020-05-12 NOTE — Progress Notes (Signed)
Called by RN to assess pt for secretions and possible NTS. Pt does not seem to have copious secretions in the upper airways and lungs while listening during assessment. Family informed that they can call for RT at any time if they have any other questions and concerns.

## 2020-05-12 NOTE — Progress Notes (Signed)
ANTICOAGULATION CONSULT NOTE - Follow Up Consult  Pharmacy Consult for heparin Indication: Afib, CVA, and h/o VTE  Labs: Recent Labs    05/10/20 0603 05/10/20 2050 05/11/20 0643 05/11/20 1227 05/11/20 1628 05/12/20 0311  HGB 11.6*  --  11.0*  --   --  11.4*  HCT 34.8*  --  34.7*  --   --  36.1*  PLT 448*  --  463*  --   --  497*  LABPROT  --   --   --  15.0  --   --   INR  --   --   --  1.2  --   --   HEPARINUNFRC 0.22*   < > 0.62  --  0.55 0.74*  CREATININE 0.80  --  0.79  --   --  0.78   < > = values in this interval not displayed.    Assessment: 80yo male supratherapeutic on heparin with higher level despite decreased rate; no gtt issues or signs of bleeding per RN.  Goal of Therapy:  Heparin level 0.3-0.5 units/ml   Plan:  Will decrease heparin gtt by 4 units/kg/hr to 1300 units/hr and check level in 8 hours.    Vernard Gambles, PharmD, BCPS  05/12/2020,4:14 AM

## 2020-05-12 NOTE — Procedures (Signed)
Interventional Radiology Procedure:   Indications: Dysphagia and CVA  Procedure: Gastrostomy tube placement  Findings: 20 Fr tube in stomach  Complications: None     EBL: less than 10 ml  Plan: Plan to start feeds on 05/13/20   Kelly Ranieri R. Lowella Dandy, MD  Pager: [phone]

## 2020-05-12 NOTE — Progress Notes (Signed)
PROGRESS NOTE    John Lambert  IRC:[id number] DOB: Apr 06, 1941 DOA: 04/19/2020 PCP: Laurann Montana, MD     Brief Narrative:  John Lambert is an 80 y.o.malewith PMH significant for hypertension, melanoma, neurofibroma, clotting disorder, history of DVT and PE on chronic coumadinwhopresented to the ED on 12/14 with c/o bilateral lower extremity weakness and difficulty ambulating associated with pain in the left hip region (has a history of neurofibroma tumor removal from his spine about 4 years ago and melanoma removal from right calf about 30 years ago).   MRI brain obtainednoted numerousscattered punctate acute infarctions in the left posterior frontal region consistent with micro embolic infarctions which could either be in the anterior or middle cerebral arteryterritory. Punctate acute infarction also affecting the splenium of the corpus callosum.Extensive chronic small-vessel ischemic changes elsewherethroughout the brain.Small old left parietal vertex cortical and subcorticalInfarctions.  MRI lumbar spine showedchronicarachnoiditis with adhesions in the thecal sac and distorted cauda equina nerve roots from L2-L3 to the sacrum, but no evidence ofrecurrent spinal tumor.Possible right L5/S1 radiculitis after noting L4/5 multifactorial mild to moderate spinal stenosis. Vascular surgery was consulted and took patient on 12/22 for a left transcarotid artery stenting. Patient found to have 80% carotid stenosis and then following procedure down to less than 10% with stent. However, following surgery patient noted to have difficulty moving right side, expressive aphasia, and left gaze deviation and code stroke immediately called. Patient taken straight to CT scan. No new acute infarction noted. Patient underwent emergent left carotid diagnostic cerebral catheter angiogram with left MCA embolism noted from previous, but no acute findings. No intervention planned. EEG noted cortical  dysfunction left hemisphere, but no seizures or epileptiform discharges seen throughout recording.  He was transferred to the stroke service on 04/27/2020 and then transferred out to the [facility] service on 04/29/2020.  Currently he continues to remain weak and his right upper arm is flaccid.  Now having gross hematuria as he has been placed on aspirin Plavix as well as heparin and Coumadin.  Heparin drip has now been stopped given that his Coumadin level therapeutic. Coumadin held given his gross hematuria. Case was discussed with urology Dr. Sebastian Ache who recommends obtaining baseline imaging with CT of the abdomen pelvis with and without contrast for hematuria.  Unfortunately imaging was not able to be obtained given his worsened renal function so this was done without contrast.  This showed possible pyelonephritis and pancreatitis was essentially ruled out given his normal lipase level.  Foley catheter was inserted and urine grew out Klebsiella. Renal function is improving now with Foley catheter drainage and he is diuresing fairly well.  Patient WBC is trending down as well as his renal function.  Coumadin has been resumed.  Due to somnolence, patient underwent repeat MRI which was unchanged and the EEG which still showed encephalopathy which was improved. Scrotal ultrasound obtained due to scrotal edema - likely this is in the setting of his anasarca and extreme volume overload. Urology was consulted and recommend outpatient ultrasound in several months to verify stability. Urology recommends and trial of void prior to discharge for having outpatient trial void in office.   Due to continued poor PO intake and dysphagia, PEG tube was placed 1/6.   New events last 24 hours / Subjective: Patient underwent PEG placement his morning without acute complication. He is laying in bed, moaning, alert to voice but does not attempt to answer questions.   Assessment & Plan:   Principal Problem:  Weakness Active Problems:   Anticoagulated on warfarin   Impaired ambulation   Fall at home, initial encounter   Essential hypertension   Blood clotting disorder ([place])   Cerebral embolism with cerebral infarction   Stroke [facility])   Middle cerebral artery embolism, left   Acute CVA -Status post left carotid stent placement complicated by postop stroke.  Aphasic with right hemiplegia -Follow-up with vascular surgery Dr. Trula Slade  -Follow-up with neurology in 4 weeks  -Aspirin, Plavix, Coumadin for 1 month then going to Coumadin and aspirin alone.    Dysphagia -S/p PEG 1/6. To start TF 1/7   Dyspnea -CXR consistent with scarring, atelectasis -On room air without distress   AKI  -Resolved status post Foley placement  Acute urinary retention -Urology recommending trial of void prior to discharge versus as an outpatient -Continue Proscar  Klebsiella UTI -Rocephin --> Keflex  Hematuria -Resolved   Scrotal edema, ecchymosis -Appreciate urology -Monitor  History of blood clotting disorder, PE, DVT -Resume coumadin/heparin   Hyperlipidemia -Continue pravastatin  Paroxysmal atrial fibrillation -Resume coumadin/heparin  -Continue Coreg  Essential hypertension -Continue Coreg   DVT prophylaxis: Resume coumadin/heparin  Code Status: Full code Family Communication: No family at bedside  Disposition Plan:  Status is: Inpatient  Remains inpatient appropriate because:Unsafe d/c plan and IV treatments appropriate due to intensity of illness or inability to take PO   Dispo: The patient is from: Home              Anticipated d/c is to: SNF              Anticipated d/c date is: 3 days              Patient currently is not medically stable to d/c.  PEG placed 1/6. Resume TF 1/7. SNF placement pending     Antimicrobials:  Anti-infectives (From admission, onward)   Start     Dose/Rate Route Frequency Ordered Stop   05/12/20 0900  ceFAZolin (ANCEF) IVPB 2g/100 mL premix         2 g 200 mL/hr over 30 Minutes Intravenous On call 05/12/20 0831 05/12/20 1104   05/11/20 0600  cephALEXin (KEFLEX) 250 MG/5ML suspension 500 mg        500 mg Per Tube Every 8 hours 05/10/20 1036 05/14/20 0559   05/04/20 0945  azithromycin (ZITHROMAX) 500 mg in sodium chloride 0.9 % 250 mL IVPB  Status:  Discontinued        500 mg 250 mL/hr over 60 Minutes Intravenous Every 24 hours 05/04/20 0859 05/05/20 0956   05/04/20 0900  cefTRIAXone (ROCEPHIN) 1 g in sodium chloride 0.9 % 100 mL IVPB  Status:  Discontinued        1 g 200 mL/hr over 30 Minutes Intravenous Every 24 hours 05/04/20 0803 05/10/20 1036   04/27/20 0600  ceFAZolin (ANCEF) IVPB 2g/100 mL premix  Status:  Discontinued       Note to Pharmacy: Send with pt to OR   2 g 200 mL/hr over 30 Minutes Intravenous On call 04/26/20 0805 04/26/20 2018   04/27/20 0600  ceFAZolin (ANCEF) IVPB 2g/100 mL premix        2 g 200 mL/hr over 30 Minutes Intravenous 30 min pre-op 04/26/20 2013 04/27/20 0804   04/27/20 0000  ceFAZolin (ANCEF) IVPB 1 g/50 mL premix  Status:  Discontinued       Note to Pharmacy: Send with pt to OR   1 g 100 mL/hr over 30 Minutes Intravenous  On call 04/26/20 0800 04/26/20 0805       Objective: Vitals:   05/12/20 1105 05/12/20 1111 05/12/20 1120 05/12/20 1248  BP: (!) 128/110 (!) 146/80 135/62 135/82  Pulse: 92 86 87 85  Resp: 14 14 16 19   Temp:    98.1 F (36.7 C)  TempSrc:    Axillary  SpO2: 98% 99% 95% 94%  Weight:      Height:        Intake/Output Summary (Last 24 hours) at 05/12/2020 1321 Last data filed at 05/12/2020 1253 Gross per 24 hour  Intake 1540.04 ml  Output 2325 ml  Net -784.96 ml   Filed Weights   05/09/20 0500 05/10/20 0324 05/12/20 0421  Weight: 81.5 kg 78.8 kg 80.6 kg    Examination: General exam: Appears weak and frail appearing Respiratory system: Clear to auscultation. Respiratory effort normal. Cardiovascular system: S1 & S2 heard, RRR. No pedal edema. Gastrointestinal  system: Abdomen is nondistended, soft Central nervous system: Alert to voice but does not interact  Extremities: Symmetric in appearance bilaterally    Data Reviewed: I have personally reviewed following labs and imaging studies  CBC: Recent Labs  Lab 05/07/20 0207 05/08/20 0350 05/09/20 0957 05/10/20 0603 05/11/20 0643 05/12/20 0311  WBC 10.7* 9.9 11.4* 12.7* 11.3* 12.0*  NEUTROABS 8.1* 7.4 8.6* 9.4* 7.8*  --   HGB 11.3* 11.0* 11.8* 11.6* 11.0* 11.4*  HCT 35.6* 33.8* 35.9* 34.8* 34.7* 36.1*  MCV 93.2 93.6 92.5 92.1 93.5 94.3  PLT 392 359 392 448* 463* [id number]*   Basic Metabolic Panel: Recent Labs  Lab 05/07/20 0207 05/08/20 0350 05/09/20 0957 05/10/20 0603 05/11/20 0643 05/12/20 0311  NA 146* 144 136 136 136 137  K 4.7 4.5 4.6 4.5 4.6 4.8  CL 111 108 102 102 105 103  CO2 25 26 23 25 24 25   GLUCOSE 114* 132* 121* 119* 133* 112*  BUN 38* 36* 32* 31* 29* 26*  CREATININE 0.97 0.99 0.77 0.80 0.79 0.78  CALCIUM 8.5* 8.5* 8.3* 8.2* 8.2* 8.4*  MG 2.1 2.0 2.0 2.0 2.0  --   PHOS 4.1 3.6 3.3 3.3 3.1  --    GFR: Estimated Creatinine Clearance: 82.6 mL/min (by C-G formula based on SCr of 0.78 mg/dL). Liver Function Tests: Recent Labs  Lab 05/08/20 0350 05/09/20 0957 05/10/20 0603 05/11/20 0643 05/12/20 0311  AST 64* 56* 47* 44* 46*  ALT 90* 87* 79* 72* 75*  ALKPHOS 37* 40 41 44 48  BILITOT 1.1 0.7 0.7 1.0 0.9  PROT 5.8* 5.8* 5.7* 5.8* 6.0*  ALBUMIN 2.8* 2.7* 2.7* 2.7* 2.8*   No results for input(s): LIPASE, AMYLASE in the last 168 hours. No results for input(s): AMMONIA in the last 168 hours. Coagulation Profile: Recent Labs  Lab 05/06/20 0400 05/07/20 0207 05/08/20 0350 05/11/20 1227 05/12/20 1134  INR 1.5* 1.5* 1.4* 1.2 1.1   Cardiac Enzymes: No results for input(s): CKTOTAL, CKMB, CKMBINDEX, TROPONINI in the last 168 hours. BNP (last 3 results) No results for input(s): PROBNP in the last 8760 hours. HbA1C: No results for input(s): HGBA1C in the last 72  hours. CBG: Recent Labs  Lab 05/11/20 1933 05/11/20 2323 05/12/20 0308 05/12/20 0751 05/12/20 1151  GLUCAP 124* 84 106* 108* 138*   Lipid Profile: No results for input(s): CHOL, HDL, LDLCALC, TRIG, CHOLHDL, LDLDIRECT in the last 72 hours. Thyroid Function Tests: No results for input(s): TSH, T4TOTAL, FREET4, T3FREE, THYROIDAB in the last 72 hours. Anemia Panel: No results for input(s): VITAMINB12, FOLATE,  FERRITIN, TIBC, IRON, RETICCTPCT in the last 72 hours. Sepsis Labs: No results for input(s): PROCALCITON, LATICACIDVEN in the last 168 hours.  Recent Results (from the past 240 hour(s))  Culture, Urine     Status: Abnormal   Collection Time: 05/03/20 10:42 AM   Specimen: Urine, Random  Result Value Ref Range Status   Specimen Description URINE, RANDOM  Final   Special Requests   Final    NONE Performed at [facility], [address], [place], [place] [address]    Culture >=100,000 COLONIES/mL KLEBSIELLA OXYTOCA (A)  Final   Report Status 05/05/2020 FINAL  Final   Organism ID, Bacteria KLEBSIELLA OXYTOCA (A)  Final      Susceptibility   Klebsiella oxytoca - MIC*    AMPICILLIN RESISTANT Resistant     CEFAZOLIN 8 SENSITIVE Sensitive     CEFEPIME <=0.12 SENSITIVE Sensitive     CEFTRIAXONE <=0.25 SENSITIVE Sensitive     CIPROFLOXACIN <=0.25 SENSITIVE Sensitive     GENTAMICIN <=1 SENSITIVE Sensitive     IMIPENEM <=0.25 SENSITIVE Sensitive     NITROFURANTOIN <=16 SENSITIVE Sensitive     TRIMETH/SULFA <=20 SENSITIVE Sensitive     AMPICILLIN/SULBACTAM 4 SENSITIVE Sensitive     PIP/TAZO <=4 SENSITIVE Sensitive     * >=100,000 COLONIES/mL KLEBSIELLA OXYTOCA  Culture, blood (routine x 2)     Status: Abnormal   Collection Time: 05/04/20  4:46 PM   Specimen: BLOOD  Result Value Ref Range Status   Specimen Description BLOOD BLOOD LEFT FOREARM  Final   Special Requests   Final    BOTTLES DRAWN AEROBIC ONLY Blood Culture adequate volume   Culture  Setup Time   Final     AEROBIC BOTTLE ONLY GRAM POSITIVE COCCI Organism ID to follow CRITICAL RESULT CALLED TO, READ BACK BY AND VERIFIED WITH: J LEDFORD [facility] 05/06/20 0615 JDW    Culture (A)  Final    STAPHYLOCOCCUS EPIDERMIDIS THE SIGNIFICANCE OF ISOLATING THIS ORGANISM FROM A SINGLE SET OF BLOOD CULTURES WHEN MULTIPLE SETS ARE DRAWN IS UNCERTAIN. PLEASE NOTIFY THE MICROBIOLOGY DEPARTMENT WITHIN ONE WEEK IF SPECIATION AND SENSITIVITIES ARE REQUIRED. Performed at [facility], [address], [place], [place] [address]    Report Status 05/07/2020 FINAL  Final  Blood Culture ID Panel (Reflexed)     Status: Abnormal   Collection Time: 05/04/20  4:46 PM  Result Value Ref Range Status   Enterococcus faecalis NOT DETECTED NOT DETECTED Final   Enterococcus Faecium NOT DETECTED NOT DETECTED Final   Listeria monocytogenes NOT DETECTED NOT DETECTED Final   Staphylococcus species DETECTED (A) NOT DETECTED Final    Comment: CRITICAL RESULT CALLED TO, READ BACK BY AND VERIFIED WITH: J LEDFORD [facility] 05/06/20 0615 JDW    Staphylococcus aureus (BCID) NOT DETECTED NOT DETECTED Final   Staphylococcus epidermidis DETECTED (A) NOT DETECTED Final    Comment: Methicillin (oxacillin) resistant coagulase negative staphylococcus. Possible blood culture contaminant (unless isolated from more than one blood culture draw or clinical case suggests pathogenicity). No antibiotic treatment is indicated for blood  culture contaminants. CRITICAL RESULT CALLED TO, READ BACK BY AND VERIFIED WITH: J LEDFORD [facility] 05/06/20 0615 JDW    Staphylococcus lugdunensis NOT DETECTED NOT DETECTED Final   Streptococcus species NOT DETECTED NOT DETECTED Final   Streptococcus agalactiae NOT DETECTED NOT DETECTED Final   Streptococcus pneumoniae NOT DETECTED NOT DETECTED Final   Streptococcus pyogenes NOT DETECTED NOT DETECTED Final   A.calcoaceticus-baumannii NOT DETECTED NOT DETECTED Final  Bacteroides fragilis NOT DETECTED NOT DETECTED Final    Enterobacterales NOT DETECTED NOT DETECTED Final   Enterobacter cloacae complex NOT DETECTED NOT DETECTED Final   Escherichia coli NOT DETECTED NOT DETECTED Final   Klebsiella aerogenes NOT DETECTED NOT DETECTED Final   Klebsiella oxytoca NOT DETECTED NOT DETECTED Final   Klebsiella pneumoniae NOT DETECTED NOT DETECTED Final   Proteus species NOT DETECTED NOT DETECTED Final   Salmonella species NOT DETECTED NOT DETECTED Final   Serratia marcescens NOT DETECTED NOT DETECTED Final   Haemophilus influenzae NOT DETECTED NOT DETECTED Final   Neisseria meningitidis NOT DETECTED NOT DETECTED Final   Pseudomonas aeruginosa NOT DETECTED NOT DETECTED Final   Stenotrophomonas maltophilia NOT DETECTED NOT DETECTED Final   Candida albicans NOT DETECTED NOT DETECTED Final   Candida auris NOT DETECTED NOT DETECTED Final   Candida glabrata NOT DETECTED NOT DETECTED Final   Candida krusei NOT DETECTED NOT DETECTED Final   Candida parapsilosis NOT DETECTED NOT DETECTED Final   Candida tropicalis NOT DETECTED NOT DETECTED Final   Cryptococcus neoformans/gattii NOT DETECTED NOT DETECTED Final   Methicillin resistance mecA/C DETECTED (A) NOT DETECTED Final    Comment: CRITICAL RESULT CALLED TO, READ BACK BY AND VERIFIED WITHLenna Sciara [facility] 05/06/20 0615 JDW Performed at [facility], [address], [place], [place] [address]   Culture, blood (routine x 2)     Status: None   Collection Time: 05/04/20  4:52 PM   Specimen: BLOOD LEFT HAND  Result Value Ref Range Status   Specimen Description BLOOD LEFT HAND  Final   Special Requests   Final    BOTTLES DRAWN AEROBIC ONLY Blood Culture adequate volume   Culture   Final    NO GROWTH 5 DAYS Performed at [facility], [place] [address], [place], [place] [address]    Report Status 05/09/2020 FINAL  Final  Culture, blood (routine x 2)     Status: None   Collection Time: 05/06/20  9:00 AM   Specimen: BLOOD  Result Value Ref Range Status    Specimen Description BLOOD RIGHT ANTECUBITAL  Final   Special Requests   Final    BOTTLES DRAWN AEROBIC AND ANAEROBIC Blood Culture adequate volume   Culture   Final    NO GROWTH 5 DAYS Performed at [facility], [place] [address] [place], [place] [address]    Report Status 05/11/2020 FINAL  Final  Culture, blood (routine x 2)     Status: None   Collection Time: 05/06/20  9:03 AM   Specimen: BLOOD RIGHT ARM  Result Value Ref Range Status   Specimen Description BLOOD RIGHT ARM  Final   Special Requests   Final    BOTTLES DRAWN AEROBIC AND ANAEROBIC Blood Culture adequate volume   Culture   Final    NO GROWTH 5 DAYS Performed at [facility], [place] [address] [place], [place] [address]    Report Status 05/11/2020 FINAL  Final  Surgical PCR screen     Status: Abnormal   Collection Time: 05/12/20 12:25 AM   Specimen: Nasal Mucosa; Nasal Swab  Result Value Ref Range Status   MRSA, PCR NEGATIVE NEGATIVE Final   Staphylococcus aureus POSITIVE (A) NEGATIVE Final    Comment: (NOTE) The Xpert SA Assay (FDA approved for NASAL specimens in patients 62 years of age and older), is one component of a comprehensive surveillance program. It is not intended to diagnose infection nor to guide  or monitor treatment. Performed at [facility], [place] [address] [place], [place] [address]       Radiology Studies: No results found.    Scheduled Meds: . aspirin  81 mg Per Tube Daily  . carvedilol  12.5 mg Per Tube BID WC  . cephALEXin  500 mg Per Tube Q8H  . Chlorhexidine Gluconate Cloth  6 each Topical Daily  . clopidogrel  75 mg Per Tube Daily  . feeding supplement (PROSource TF)  45 mL Per Tube BID  . finasteride  5 mg Oral Daily  . insulin aspart  0-9 Units Subcutaneous Q4H  . lidocaine      . mupirocin ointment  1 application Nasal BID  . pravastatin  40 mg Per Tube Daily   Continuous Infusions: . dextrose 5 % and 0.9% NaCl 50 mL/hr at 05/12/20 1242  . feeding  supplement (JEVITY 1.5 CAL/FIBER) 55 mL/hr at 05/11/20 1708  . heparin 1,300 Units/hr (05/12/20 1303)     LOS: 21 days      Time spent: 25 minutes   Dessa Phi, DO [facility] 05/12/2020, 1:21 PM   Available via Epic secure chat 7am-7pm After these hours, please refer to coverage provider listed on [email or web address]

## 2020-05-12 NOTE — Progress Notes (Addendum)
ANTICOAGULATION CONSULT NOTE - Follow Up Consult  Pharmacy Consult for heparin Indication: Afib, CVA, and h/o VTE  Labs: Recent Labs    05/10/20 0603 05/10/20 2050 05/11/20 0643 05/11/20 1227 05/11/20 1628 05/12/20 0311 05/12/20 1134 05/12/20 2137  HGB 11.6*  --  11.0*  --   --  11.4*  --   --   HCT 34.8*  --  34.7*  --   --  36.1*  --   --   PLT 448*  --  463*  --   --  497*  --   --   LABPROT  --   --   --  15.0  --   --  14.2  --   INR  --   --   --  1.2  --   --  1.1  --   HEPARINUNFRC 0.22*   < > 0.62  --    < > 0.74* <0.10* 0.23*  CREATININE 0.80  --  0.79  --   --  0.78  --   --    < > = values in this interval not displayed.    Assessment: 79yo male subtherapeutic on heparin after resumed; no gtt issues or signs of bleeding per RN.  Goal of Therapy:  Heparin level 0.3-0.5 units/ml   Plan:  Will increase heparin gtt by 1-2 units/kg/hr to 1400 units/hr and check level with am labs.    Vernard Gambles, PharmD, BCPS  05/12/2020,11:16 PM   Addendum: Heparin level 0.42, now at goal.  Will continue gtt at current rate and confirm stable with additional level.    VB 05/13/2020 3:06 AM

## 2020-05-12 NOTE — Progress Notes (Addendum)
Nutrition Follow-up  DOCUMENTATION CODES:   Not applicable  INTERVENTION:  Once PEG is ready for use, continue: -Jevity 1.5 @ 57ml/hr (1336ml/d) -2ml Prosource TF BID -d/c free water flushes given pt on IVF  Provides 2060 kcal, 106 gm protein, 1003 ml free water daily   NUTRITION DIAGNOSIS:   Inadequate oral intake related to dysphagia as evidenced by NPO status.  ongoing  GOAL:   Patient will meet greater than or equal to 90% of their needs  Met with TF  MONITOR:   TF tolerance,Labs,Diet advancement  REASON FOR ASSESSMENT:   Rounds    ASSESSMENT:   80 yo male admitted with BLE weakness and difficulty walking; left carotid artery stenosis. 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  12/22 s/p L TCAR 12/24 Cortrak placed (gastric) 12/27 Cortrak replaced (gastric)  Pt continues to fail swallow evaluations and has been receiving TF via Cortrak. Current TF regimen: Jevity 1.5 @ 38ml/hr, 8ml Prosource TF BID, 170ml free water Q6H. Pt receiving PEG today. TF regimen can be resumed once PEG is ready for use. Will need to add free water flushes if IVF are discontiued.  Admit wt: 79.7 kg Current wt: 80.6 kg  UOP: 295ml documented today  Labs: CBGs 84-108 Medications: ss novolog Q4H IVF: D5-1/2 NS @ 42ml/hr  Diet Order:   Diet Order            Diet NPO time specified Except for: Ice Chips, Sips with Meds  Diet effective now                 EDUCATION NEEDS:   Not appropriate for education at this time  Skin:  Skin Assessment: Skin Integrity Issues: Skin Integrity Issues:: Incisions,Other (Comment) Incisions: groin and neck Other: puncture groin  Last BM:  1/5 type 6  Height:   Ht Readings from Last 1 Encounters:  04/20/20 $RemoveB'6\' 2"'aIQKcZmt$  (1.88 m)    Weight:   Wt Readings from Last 1 Encounters:  05/12/20 80.6 kg    Ideal Body Weight:  86.4  kg  BMI:  Body mass index is 22.81 kg/m.  Estimated Nutritional Needs:   Kcal:  2000-2200  Protein:  105-125 gm  Fluid:  >/= 2 L    Larkin Ina, MS, RD, LDN RD pager number and weekend/on-call pager number located in [place].

## 2020-05-12 NOTE — Progress Notes (Signed)
ANTICOAGULATION CONSULT NOTE  Pharmacy Consult for Warfarin >> heparin Indication: on PTA warfarin for hx of DVT/PE, new acute stroke, new Afib  Patient Measurements: Height: 6\' 2"  (188 cm) Weight: 80.6 kg (177 lb 11.1 oz) IBW/kg (Calculated) : 82.2  Heparin weight: 83.1  Vital Signs: Temp: 98.1 F (36.7 C) (01/06 1248) Temp Source: Axillary (01/06 1248) BP: 135/82 (01/06 1248) Pulse Rate: 85 (01/06 1248)  Labs: Recent Labs    05/10/20 0603 05/10/20 2050 05/11/20 0643 05/11/20 1227 05/11/20 1628 05/12/20 0311 05/12/20 1134  HGB 11.6*  --  11.0*  --   --  11.4*  --   HCT 34.8*  --  34.7*  --   --  36.1*  --   PLT 448*  --  463*  --   --  497*  --   LABPROT  --   --   --  15.0  --   --  14.2  INR  --   --   --  1.2  --   --  1.1  HEPARINUNFRC 0.22*   < > 0.62  --  0.55 0.74* <0.10*  CREATININE 0.80  --  0.79  --   --  0.78  --    < > = values in this interval not displayed.    Estimated Creatinine Clearance: 82.6 mL/min (by C-G formula based on SCr of 0.78 mg/dL).  Assessment: 80 yo male with L symptomatic carotid stenosis.  S/p L carotid TCAR procedure w/ stenting that was complicated by postprocedure stroke.  MRI brain shows patchy L basal ganglia and L frontoparietal cortical and subcortical infarcts (new). No hemorrhagic transformation. New stroke and new onset atrial fibrillation. Previous reported hematuria but appears to have resolved.  PTA warfarin dose: 2.5mg  daily except 1.25mg  on Mondays.  1/2: IR consulted for PEG tube placement due to multiple failed swallow study tests.  Procedure planned for Thursday, 1/6 to allow adequate wash-out from warfarin.  Warfarin will be HELD starting today. Last dose of warfarin 05/07/20.  Pharmacy consulted to initiate heparin drip while warfarin held.   Pt is now s/p PEG. Ok to resume IV heparin this PM around 2pm and coumadin. INR 1.1, HL was above goal this AM so we will resume at the lower rate.   PTA dose: 2.5mg  daily  except 1.25mg  on Mondays Goal of Therapy:   Heparin level 0.3-0.5 units/ml (more narrow goal due to recent stroke) INR goal 2-3  Monitor platelets by anticoagulation protocol: Yes    Plan: -Resume heparin at 1300 units/h -Check heparin level in 8h Coumadin 4mg  PO x1 today Daily INR  08-03-1973, PharmD, BCIDP, AAHIVP, CPP Infectious Disease Pharmacist 05/12/2020 1:18 PM

## 2020-05-12 NOTE — Plan of Care (Signed)
  Problem: Health Behavior/Discharge Planning: Goal: Ability to manage health-related needs will improve Outcome: Progressing   Problem: Clinical Measurements: Goal: Will remain free from infection Outcome: Progressing Goal: Diagnostic test results will improve Outcome: Progressing Goal: Respiratory complications will improve Outcome: Progressing Goal: Cardiovascular complication will be avoided Outcome: Progressing   Problem: Activity: Goal: Risk for activity intolerance will decrease Outcome: Progressing   Problem: Nutrition: Goal: Adequate nutrition will be maintained Outcome: Progressing   Problem: Coping: Goal: Level of anxiety will decrease Outcome: Progressing   Problem: Elimination: Goal: Will not experience complications related to bowel motility Outcome: Progressing Goal: Will not experience complications related to urinary retention Outcome: Progressing   Problem: Pain Managment: Goal: General experience of comfort will improve Outcome: Progressing   Problem: Skin Integrity: Goal: Risk for impaired skin integrity will decrease Outcome: Progressing   Problem: Education: Goal: Knowledge of disease or condition will improve 05/12/2020 0259 by Ronalee Red, RN Outcome: Progressing 05/12/2020 0259 by Ronalee Red, RN Outcome: Progressing Goal: Knowledge of secondary prevention will improve 05/12/2020 0259 by Ronalee Red, RN Outcome: Progressing 05/12/2020 0259 by Ronalee Red, RN Outcome: Progressing Goal: Knowledge of patient specific risk factors addressed and post discharge goals established will improve 05/12/2020 0259 by Ronalee Red, RN Outcome: Progressing 05/12/2020 0259 by Ronalee Red, RN Outcome: Progressing Goal: Individualized Educational Video(s) 05/12/2020 0259 by Ronalee Red, RN Outcome: Progressing 05/12/2020 0259 by Ronalee Red, RN Outcome: Progressing   Problem: Coping: Goal: Will verbalize positive feelings about  self 05/12/2020 0259 by Ronalee Red, RN Outcome: Progressing 05/12/2020 0259 by Ronalee Red, RN Outcome: Progressing Goal: Will identify appropriate support needs 05/12/2020 0259 by Ronalee Red, RN Outcome: Progressing 05/12/2020 0259 by Ronalee Red, RN Outcome: Progressing   Problem: Health Behavior/Discharge Planning: Goal: Ability to manage health-related needs will improve 05/12/2020 0259 by Ronalee Red, RN Outcome: Progressing 05/12/2020 0259 by Ronalee Red, RN Outcome: Progressing   Problem: Self-Care: Goal: Verbalization of feelings and concerns over difficulty with self-care will improve 05/12/2020 0259 by Ronalee Red, RN Outcome: Progressing 05/12/2020 0259 by Ronalee Red, RN Outcome: Progressing

## 2020-05-12 NOTE — NC FL2 (Signed)
[place] LEVEL OF CARE SCREENING TOOL     IDENTIFICATION  Patient Name: John Lambert Birthdate: October 29, 1940 Sex: male Admission Date (Current Location): 04/19/2020  [facility] and [place] Number:  Herbalist and Address:  [facility], [place] [address] [place], [place] [address]      Provider Number: [id number]  Attending Physician Name and Address:  Dessa Phi, DO  Relative Name and Phone Number:  Reino Bellis, spouse, [phone]    Current Level of Care: Hospital Recommended Level of Care: [place] Prior Approval Number:    Date Approved/Denied:   PASRR Number: [id number]  Discharge Plan: SNF    Current Diagnoses: Patient Active Problem List   Diagnosis Date Noted  . Middle cerebral artery embolism, left 04/27/2020  . Cerebral embolism with cerebral infarction 04/21/2020  . Stroke ([place]) 04/21/2020  . Anticoagulated on warfarin 04/20/2020  . Impaired ambulation 04/20/2020  . Fall at home, initial encounter 04/20/2020  . Essential hypertension 04/20/2020  . Blood clotting disorder ([place]) 04/20/2020  . Weakness 04/19/2020    Orientation RESPIRATION BLADDER Height & Weight     Self,Time  Normal Incontinent Weight: 177 lb 11.1 oz (80.6 kg) Height:  6\' 2"  (188 cm)  BEHAVIORAL SYMPTOMS/MOOD NEUROLOGICAL BOWEL NUTRITION STATUS      Incontinent Feeding tube  AMBULATORY STATUS COMMUNICATION OF NEEDS Skin   Extensive Assist Verbally Surgical wounds (closed incisions left groin and left neck, no dressing; puncture wound right groin, no dressing)                       Personal Care Assistance Level of Assistance  Bathing,Feeding,Dressing Bathing Assistance: Maximum assistance Feeding assistance: Maximum assistance Dressing Assistance: Maximum assistance     Functional Limitations Info  Sight,Speech Sight Info: Impaired   Speech Info: Impaired (expressive aphasia)    SPECIAL CARE FACTORS  FREQUENCY  PT (By licensed PT),OT (By licensed OT),Speech therapy     PT Frequency: 5x/week OT Frequency: 5x/weel     Speech Therapy Frequency: 2x/week      Contractures Contractures Info: Not present    Additional Factors Info  Code Status,Allergies Code Status Info: Full Allergies Info: Hctz (Hydrochlorothiazide), Hydrocodone, Vicodin (Hydrocodone-acetaminophen)           Current Medications (05/12/2020):  This is the current hospital active medication list Current Facility-Administered Medications  Medication Dose Route Frequency Provider Last Rate Last Admin  . acetaminophen (TYLENOL) tablet 650 mg  650 mg Oral Q4H PRN Donzetta Starch, NP   650 mg at 05/03/20 0055   Or  . acetaminophen (TYLENOL) 160 MG/5ML solution 650 mg  650 mg Per Tube Q4H PRN Donzetta Starch, NP   650 mg at 05/05/20 1941   Or  . acetaminophen (TYLENOL) suppository 650 mg  650 mg Rectal Q4H PRN Donzetta Starch, NP      . aspirin chewable tablet 81 mg  81 mg Per Tube Daily Raiford Noble [place], DO   81 mg at 05/11/20 [id number]  . benzonatate (TESSALON) capsule 200 mg  200 mg Oral TID PRN Raiford Noble Latif, DO      . carvedilol (COREG) tablet 12.5 mg  12.5 mg Per Tube BID WC Sheikh, Omair Latif, DO   12.5 mg at 05/11/20 1707  . ceFAZolin (ANCEF) IVPB 2g/100 mL premix  2 g Intravenous On Call Ardis Rowan, PA-C      . cephALEXin [facility]) 250 MG/5ML suspension 500 mg  500 mg Per Tube Q8H Pham, Minh Q, RPH-CPP   500 mg at 05/12/20 0539  . Chlorhexidine Gluconate Cloth 2 % PADS 6 each  6 each Topical Daily Layne Benton, NP   6 each at 05/11/20 [phone]  . clopidogrel (PLAVIX) tablet 75 mg  75 mg Per Tube Daily Marguerita Merles [place], DO   75 mg at 05/11/20 8182  . cyclobenzaprine (FLEXERIL) tablet 5 mg  5 mg Per Tube QHS PRN Marguerita Merles Latif, DO      . dextrose 5 %-0.9 % sodium chloride infusion   Intravenous Continuous Zierle-Ghosh, Asia B, DO 50 mL/hr at 05/12/20 0052 New Bag at 05/12/20 0052  . feeding  supplement (JEVITY 1.5 CAL/FIBER) liquid 1,000 mL  1,000 mL Per Tube Continuous Marguerita Merles [place], DO 55 mL/hr at 05/11/20 1708 Restarted at 05/11/20 1708  . feeding supplement (PROSource TF) liquid 45 mL  45 mL Per Tube BID Marguerita Merles Zeb, DO   45 mL at 05/11/20 2210  . finasteride (PROSCAR) tablet 5 mg  5 mg Oral Daily Sebastian Ache, MD   5 mg at 05/11/20 9937  . free water 175 mL  175 mL Per Tube Q6H SheikhKateri Mc Kathryn, DO   175 mL at 05/11/20 2357  . guaiFENesin-dextromethorphan (ROBITUSSIN DM) 100-10 MG/5ML syrup 5 mL  5 mL Per Tube Q6H PRN Opyd, Lavone Neri, MD   5 mL at 05/11/20 1449  . heparin ADULT infusion 100 units/mL (25000 units/241mL)  1,300 Units/hr Intravenous Continuous Juliette Mangle, RPH   Stopping Infusion hung by another clincian at 05/12/20 0909  . insulin aspart (novoLOG) injection 0-9 Units  0-9 Units Subcutaneous Q4H Opyd, Lavone Neri, MD   1 Units at 05/11/20 2030  . ipratropium (ATROVENT) nebulizer solution 0.5 mg  0.5 mg Nebulization Q6H PRN Sheikh, Omair Latif, DO      . levalbuterol [facility]) nebulizer solution 0.63 mg  0.63 mg Nebulization Q6H PRN Sheikh, Omair Latif, DO      . mupirocin ointment (BACTROBAN) 2 % 1 application  1 application Nasal BID Noralee Stain, DO      . pravastatin (PRAVACHOL) tablet 40 mg  40 mg Per Tube Daily Marguerita Merles [place], DO   40 mg at 05/11/20 1696     Discharge Medications: Please see discharge summary for a list of discharge medications.  Relevant Imaging Results:  Relevant Lab Results:   Additional Information SSN: [id number]. Unvaccinated for COVID.  Baldemar Lenis, LCSW

## 2020-05-13 ENCOUNTER — Inpatient Hospital Stay (HOSPITAL_COMMUNITY): Payer: Medicare Other

## 2020-05-13 ENCOUNTER — Encounter (HOSPITAL_COMMUNITY): Payer: Medicare Other

## 2020-05-13 DIAGNOSIS — R531 Weakness: Secondary | ICD-10-CM | POA: Diagnosis not present

## 2020-05-13 LAB — COMPREHENSIVE METABOLIC PANEL
ALT: 61 U/L — ABNORMAL HIGH (ref 0–44)
AST: 41 U/L (ref 15–41)
Albumin: 2.8 g/dL — ABNORMAL LOW (ref 3.5–5.0)
Alkaline Phosphatase: 44 U/L (ref 38–126)
Anion gap: 8 (ref 5–15)
BUN: 26 mg/dL — ABNORMAL HIGH (ref 8–23)
CO2: 23 mmol/L (ref 22–32)
Calcium: 8.2 mg/dL — ABNORMAL LOW (ref 8.9–10.3)
Chloride: 105 mmol/L (ref 98–111)
Creatinine, Ser: 0.77 mg/dL (ref 0.61–1.24)
GFR, Estimated: >60 mL/min (ref >60)
Glucose, Bld: 103 mg/dL — ABNORMAL HIGH (ref 70–99)
Potassium: 4.3 mmol/L (ref 3.5–5.1)
Sodium: 136 mmol/L (ref 135–145)
Total Bilirubin: 1 mg/dL (ref 0.3–1.2)
Total Protein: 5.9 g/dL — ABNORMAL LOW (ref 6.5–8.1)

## 2020-05-13 LAB — CBC
HCT: 37.3 % — ABNORMAL LOW (ref 39.0–52.0)
Hemoglobin: 11.5 g/dL — ABNORMAL LOW (ref 13.0–17.0)
MCH: 29.4 pg (ref 26.0–34.0)
MCHC: 30.8 g/dL (ref 30.0–36.0)
MCV: 95.4 fL (ref 80.0–100.0)
Platelets: 433 10*3/uL — ABNORMAL HIGH (ref 150–400)
RBC: 3.91 MIL/uL — ABNORMAL LOW (ref 4.22–5.81)
RDW: 14.1 % (ref 11.5–15.5)
WBC: 9.4 10*3/uL (ref 4.0–10.5)
nRBC: 0 % (ref 0.0–0.2)

## 2020-05-13 LAB — HEPARIN LEVEL (UNFRACTIONATED)
Heparin Unfractionated: 0.42 IU/mL (ref 0.30–0.70)
Heparin Unfractionated: 0.62 IU/mL (ref 0.30–0.70)

## 2020-05-13 LAB — GLUCOSE, CAPILLARY
Glucose-Capillary: 100 mg/dL — ABNORMAL HIGH (ref 70–99)
Glucose-Capillary: 97 mg/dL (ref 70–99)
Glucose-Capillary: 96 mg/dL (ref 70–99)
Glucose-Capillary: 119 mg/dL — ABNORMAL HIGH (ref 70–99)
Glucose-Capillary: 103 mg/dL — ABNORMAL HIGH (ref 70–99)
Glucose-Capillary: 117 mg/dL — ABNORMAL HIGH (ref 70–99)

## 2020-05-13 LAB — PROTIME-INR
INR: 1.3 — ABNORMAL HIGH (ref 0.8–1.2)
Prothrombin Time: 15.4 seconds — ABNORMAL HIGH (ref 11.4–15.2)

## 2020-05-13 MED ORDER — WARFARIN SODIUM 4 MG PO TABS
4.0000 mg | ORAL_TABLET | Freq: Once | ORAL | Status: AC
  Administered 2020-05-13: 4 mg
  Filled 2020-05-13: qty 1

## 2020-05-13 MED ORDER — HYDROCOD POLST-CPM POLST ER 10-8 MG/5ML PO SUER: 5.0000 mL | Freq: Once | ORAL | Status: DC

## 2020-05-13 NOTE — TOC Progression Note (Signed)
Transition of Care [facility]) - Progression Note    Patient Details  Name: John Lambert MRN: [id number] Date of Birth: 20-Nov-1940  Transition of Care [facility]) CM/SW [place], [place] Phone Number: 05/13/2020, 4:33 PM  Clinical Narrative:   CSW sent referral out for SNF now that patient has PEG, and asked [name] and [facility] to review. [facility] declined to offer a bed, and [name] had to send referral to Director of Nursing for review before making determination. [name] indicated that they would have a response by the end of the day today. CSW met with daughter at bedside to provide update and also discuss bed offers. Daughter to review the choices available, and CSW to follow.     Expected Discharge Plan: [place] Barriers to Discharge: [place] Work up,SNF Pending bed offer  Expected Discharge Plan and Services Expected Discharge Plan: [place] In-house Referral: Clinical Social Work Discharge Planning Services: CM Consult Post Acute Care Choice: [place] arrangements for the past 2 months: Single Family Home                                       Social Determinants of Health (SDOH) Interventions    Readmission Risk Interventions No flowsheet data found.

## 2020-05-13 NOTE — Progress Notes (Signed)
PT Cancellation Note  Patient Details Name: John Lambert MRN: [id number] DOB: April 27, 1941   Cancelled Treatment:    Reason Eval/Treat Not Completed: Fatigue/lethargy limiting ability to participate. Pt asleep and when woken up was shaking head no for therapy, agreeable if can come back in PM Lyanne Co, DPT Acute Rehabilitation Services [phone]   [place] 05/13/2020, 10:56 AM

## 2020-05-13 NOTE — Progress Notes (Signed)
Dr. Maylene Roes notified of slow oozing of blood from the PEG insertion site.  Order to hold heparin for now and to monitor.  If worsen,  Call IR to evaluate. IV heparin discontinued at 1447 and willl continue to monitor.

## 2020-05-13 NOTE — Progress Notes (Signed)
Physical Therapy Treatment Patient Details Name: John Lambert MRN: [id number] DOB: 07/28/1940 Today's Date: 05/13/2020    History of Present Illness John Lambert is a 80 y.o. male admitted 12/14 with bil LE weakness and difficulty walking as well as left hip pain. Imaging notable for Numerous scattered punctate acute infarctions in the left  posterior frontal region consistent with micro embolic infarctions. Punctate acute infarction also affecting the splenium of  the corpus callosum.  Pt with L carotid artery stenosis s/p stent on 04/27/20. Post procedure pt with right hemiparesis, aphasia and left gaze with intraprocedural stroke. Code stroke called with CT head (-) for acute findings. Emergent L carotid diagnostic cerebral cath angiogram (-) for acute findings. MRI 12/23 (+) patchy left basal ganglia as well as left frontoparietal cortical and subcortical infarcts. 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    PT Comments    Pt with limited participation in session due to lethargy. Pt's daughter present with increased attempts to arouse. Pt agreeable to minimal LE movements. Performed LLE heel slides with increase pain at PEG tube site and unwilling to continue, pt able to perform RLE with minimal cueing. Pt able to participate with repositioning in bed with verbal and tactile cueing. Pt continues to demonstrate deficits in balance, strength, coordination, endurance and safety and will benefit from skilled PT to address deficits to maximize independence with functional mobility prior to discharge.     Follow Up Recommendations  SNF;Supervision/Assistance - 24 hour     Equipment Recommendations  Wheelchair (measurements PT);Wheelchair cushion (measurements PT);Hospital bed    Recommendations for Other Services       Precautions / Restrictions      Mobility  Bed Mobility                  Transfers                     Ambulation/Gait                 Stairs             Wheelchair Mobility    Modified Rankin (Stroke Patients Only)       Balance                                            Cognition                                              Exercises General Exercises - Lower Extremity Ankle Circles/Pumps: AROM;Both;Supine;10 reps Heel Slides: AROM;Strengthening;Left;5 reps;Right;10 reps;Supine    General Comments General comments (skin integrity, edema, etc.): increased time spent arousing pt. Pt given education on participating in therapy, education to daughter to have wife present to increase motivation. pt assisted with respoitioning ni bed with performing lateral movemetn of R and L LE to the R and assist wtih trunk movement to the L, total assist for hip movement to reposition pt in the bed      Pertinent Vitals/Pain      Home Living                      Prior Function  PT Goals (current goals can now be found in the care plan section) Acute Rehab PT Goals Patient Stated Goal: daughter present and with increased concerns regarding being able to d/c to SNF PT Goal Formulation: With patient/family Time For Goal Achievement: 05/13/20 Potential to Achieve Goals: Fair Progress towards PT goals: Not progressing toward goals - comment (limited participation due to fatigue and lethargy)    Frequency    Min 2X/week      PT Plan Current plan remains appropriate    Co-evaluation              AM-PAC PT "6 Clicks" Mobility   Outcome Measure  Help needed turning from your back to your side while in a flat bed without using bedrails?: Total Help needed moving from lying on your back to sitting on the side of a flat bed without using bedrails?: Total Help needed moving to and from a bed to a chair (including a wheelchair)?: Total Help needed standing up from a chair using your arms (e.g.,  wheelchair or bedside chair)?: Total Help needed to walk in hospital room?: Total Help needed climbing 3-5 steps with a railing? : Total 6 Click Score: 6    End of Session   Activity Tolerance: Patient limited by fatigue;Patient limited by lethargy Patient left: in bed;with call bell/phone within reach;with bed alarm set;with family/visitor present Nurse Communication: Mobility status PT Visit Diagnosis: Muscle weakness (generalized) (M62.81);History of falling (Z91.81);Unsteadiness on feet (R26.81);Other abnormalities of gait and mobility (R26.89);Difficulty in walking, not elsewhere classified (R26.2);Other symptoms and signs involving the nervous system (R29.898);Hemiplegia and hemiparesis Hemiplegia - Right/Left: Right Hemiplegia - caused by: Cerebral infarction     Time: 5621-3086 PT Time Calculation (min) (ACUTE ONLY): 14 min  Charges:  $Therapeutic Activity: 8-22 mins                     Lyanne Co, DPT Acute Rehabilitation Services [phone]   [place] 05/13/2020, 2:06 PM

## 2020-05-13 NOTE — Progress Notes (Signed)
RN called IR about the persistent oozing of blood from the PEG insetion site.  Front desk will let the PA at IR know and will call back if the PA is still around.

## 2020-05-13 NOTE — Progress Notes (Addendum)
IR.  History of CVA with residual dysphagia s/p percutaneous gastrostomy tube placement in IR 05/12/2020.  Patient laying in bed resting comfortably. He opens eyes to voice, nonverbal. Daughter at bedside. Gastrostomy tube site soft with minimal ooze, clot noted in dressing (new gauze placed)- as expected as patient is on Plavix/Coumadin/IV Heparin, no erythema or drainage noted. Bumper cinched to skin.  Gastrostomy tube stable- tube is ready for full use. Please ensure bumper is cinched to skin to prevent leakage. Recommend managing ooze from site with routine dressing changes, as patient cannot come off Plavix/Coumadin/IV Heparin at this time. Further plans per TRH/vascular surgery/urology- appreciate and agree with management. Please call IR with questions/concerns.   Bea Graff Khala Tarte, PA-C 05/13/2020, 10:04 AM

## 2020-05-13 NOTE — Plan of Care (Signed)
  Problem: Education: Goal: Knowledge of disease or condition will improve Outcome: Progressing Goal: Knowledge of secondary prevention will improve Outcome: Progressing Goal: Knowledge of patient specific risk factors addressed and post discharge goals established will improve Outcome: Progressing Goal: Individualized Educational Video(s) Outcome: Progressing   Problem: Coping: Goal: Will verbalize positive feelings about self Outcome: Progressing Goal: Will identify appropriate support needs Outcome: Progressing   Problem: Health Behavior/Discharge Planning: Goal: Ability to manage health-related needs will improve Outcome: Progressing   Problem: Self-Care: Goal: Verbalization of feelings and concerns over difficulty with self-care will improve Outcome: Progressing   Problem: Nutrition: Goal: Risk of aspiration will decrease Outcome: Progressing Goal: Dietary intake will improve Outcome: Progressing   Problem: Ischemic Stroke/TIA Tissue Perfusion: Goal: Complications of ischemic stroke/TIA will be minimized Outcome: Progressing

## 2020-05-13 NOTE — Progress Notes (Signed)
Dr. Maylene Roes notified again about the oozing of blood from Peg and PA from interventional radiology is unavailable to evaluate the PEG. Will continue to monitor for patient's vital sign.

## 2020-05-13 NOTE — Progress Notes (Signed)
PROGRESS NOTE    John Lambert  SEG:[id number] DOB: May 09, 1940 DOA: 04/19/2020 PCP: Harlan Stains, MD     Brief Narrative:  John Lambert is an 80 y.o.malewith PMH significant for hypertension, melanoma, neurofibroma, clotting disorder, history of DVT and PE on chronic coumadinwhopresented to the ED on 12/14 with c/o bilateral lower extremity weakness and difficulty ambulating associated with pain in the left hip region (has a history of neurofibroma tumor removal from his spine about 4 years ago and melanoma removal from right calf about 30 years ago).   MRI brain obtainednoted numerousscattered punctate acute infarctions in the left posterior frontal region consistent with micro embolic infarctions which could either be in the anterior or middle cerebral arteryterritory. Punctate acute infarction also affecting the splenium of the corpus callosum.Extensive chronic small-vessel ischemic changes elsewherethroughout the brain.Small old left parietal vertex cortical and subcorticalInfarctions.  MRI lumbar spine showedchronicarachnoiditis with adhesions in the thecal sac and distorted cauda equina nerve roots from L2-L3 to the sacrum, but no evidence ofrecurrent spinal tumor.Possible right L5/S1 radiculitis after noting L4/5 multifactorial mild to moderate spinal stenosis. Vascular surgery was consulted and took patient on 12/22 for a left transcarotid artery stenting. Patient found to have 80% carotid stenosis and then following procedure down to less than 10% with stent. However, following surgery patient noted to have difficulty moving right side, expressive aphasia, and left gaze deviation and code stroke immediately called. Patient taken straight to CT scan. No new acute infarction noted. Patient underwent emergent left carotid diagnostic cerebral catheter angiogram with left MCA embolism noted from previous, but no acute findings. No intervention planned. EEG noted cortical  dysfunction left hemisphere, but no seizures or epileptiform discharges seen throughout recording.  He was transferred to the stroke service on 04/27/2020 and then transferred out to the [facility] service on 04/29/2020.  Currently he continues to remain weak and his right upper arm is flaccid.  Now having gross hematuria as he has been placed on aspirin Plavix as well as heparin and Coumadin.  Heparin drip has now been stopped given that his Coumadin level therapeutic. Coumadin held given his gross hematuria. Case was discussed with urology Dr. Alexis Frock who recommends obtaining baseline imaging with CT of the abdomen pelvis with and without contrast for hematuria.  Unfortunately imaging was not able to be obtained given his worsened renal function so this was done without contrast.  This showed possible pyelonephritis and pancreatitis was essentially ruled out given his normal lipase level.  Foley catheter was inserted and urine grew out Klebsiella. Renal function is improving now with Foley catheter drainage and he is diuresing fairly well.  Patient WBC is trending down as well as his renal function.  Coumadin has been resumed.  Due to somnolence, patient underwent repeat MRI which was unchanged and the EEG which still showed encephalopathy which was improved. Scrotal ultrasound obtained due to scrotal edema - likely this is in the setting of his anasarca and extreme volume overload. Urology was consulted and recommend outpatient ultrasound in several months to verify stability. Urology recommends and trial of void prior to discharge for having outpatient trial void in office.   Due to continued poor PO intake and dysphagia, PEG tube was placed 1/6.   New events last 24 hours / Subjective: Daughter is at bedside.  Patient seems to be much more alert and interactive on examination today.  She states that patient has continued to have a dry cough, receiving Robitussin as needed.  Patient  has declined other  medications to help with the cough.  Patient is able to answer questions appropriately with thumbs up, thumbs down.  Assessment & Plan:   Principal Problem:   Weakness Active Problems:   Anticoagulated on warfarin   Impaired ambulation   Fall at home, initial encounter   Essential hypertension   Blood clotting disorder ([place])   Cerebral embolism with cerebral infarction   Stroke [facility])   Middle cerebral artery embolism, left   Acute CVA -Status post left carotid stent placement complicated by postop stroke.  Aphasic with right hemiplegia -Follow-up with vascular surgery Dr. Trula Slade  -Follow-up with neurology in 4 weeks  -Aspirin, Plavix, Coumadin for 1 month then going to Coumadin and aspirin alone.    Dysphagia -S/p PEG 1/6.  Tube feeding to start 1/7  Dyspnea -CXR consistent with scarring, atelectasis -On room air without distress   AKI  -Resolved status post Foley placement  Acute urinary retention -Urology recommending trial of void prior to discharge versus as an outpatient -Continue Proscar  Klebsiella UTI -Rocephin --> Keflex  Hematuria -Resolved   Scrotal edema, ecchymosis -Appreciate urology -Monitor  History of blood clotting disorder, PE, DVT -Resume coumadin/heparin   Hyperlipidemia -Continue pravastatin  Paroxysmal atrial fibrillation -Resume coumadin/heparin  -Continue Coreg  Essential hypertension -Continue Coreg   DVT prophylaxis: Resume coumadin/heparin  Code Status: Full code Family Communication: Daughter at bedside, wife on speaker phone Disposition Plan:  Status is: Inpatient  Remains inpatient appropriate because:Unsafe d/c plan   Dispo: The patient is from: Home              Anticipated d/c is to: SNF              Anticipated d/c date is: 1 day              Patient currently is medically stable to d/c.  PEG placed 1/6. Resume TF 1/7. SNF placement pending      Antimicrobials:  Anti-infectives (From admission, onward)    Start     Dose/Rate Route Frequency Ordered Stop   05/12/20 0900  ceFAZolin (ANCEF) IVPB 2g/100 mL premix        2 g 200 mL/hr over 30 Minutes Intravenous On call 05/12/20 0831 05/12/20 1104   05/11/20 0600  cephALEXin (KEFLEX) 250 MG/5ML suspension 500 mg        500 mg Per Tube Every 8 hours 05/10/20 1036 05/14/20 0559   05/04/20 0945  azithromycin (ZITHROMAX) 500 mg in sodium chloride 0.9 % 250 mL IVPB  Status:  Discontinued        500 mg 250 mL/hr over 60 Minutes Intravenous Every 24 hours 05/04/20 0859 05/05/20 0956   05/04/20 0900  cefTRIAXone (ROCEPHIN) 1 g in sodium chloride 0.9 % 100 mL IVPB  Status:  Discontinued        1 g 200 mL/hr over 30 Minutes Intravenous Every 24 hours 05/04/20 0803 05/10/20 1036   04/27/20 0600  ceFAZolin (ANCEF) IVPB 2g/100 mL premix  Status:  Discontinued       Note to Pharmacy: Send with pt to OR   2 g 200 mL/hr over 30 Minutes Intravenous On call 04/26/20 0805 04/26/20 2018   04/27/20 0600  ceFAZolin (ANCEF) IVPB 2g/100 mL premix        2 g 200 mL/hr over 30 Minutes Intravenous 30 min pre-op 04/26/20 2013 04/27/20 0804   04/27/20 0000  ceFAZolin (ANCEF) IVPB 1 g/50 mL premix  Status:  Discontinued  Note to Pharmacy: Send with pt to OR   1 g 100 mL/hr over 30 Minutes Intravenous On call 04/26/20 0800 04/26/20 0805       Objective: Vitals:   05/12/20 2309 05/13/20 0256 05/13/20 0308 05/13/20 0817  BP: (!) 104/55  113/63 120/64  Pulse: 77  86 80  Resp: (!) 22  16 20   Temp: 98.2 F (36.8 C)  (!) 97.5 F (36.4 C) 98 F (36.7 C)  TempSrc: Oral  Oral Oral  SpO2: 95%  94% 98%  Weight:  79.3 kg    Height:        Intake/Output Summary (Last 24 hours) at 05/13/2020 1028 Last data filed at 05/13/2020 0840 Gross per 24 hour  Intake 1028.26 ml  Output 1600 ml  Net -571.74 ml   Filed Weights   05/10/20 0324 05/12/20 0421 05/13/20 0256  Weight: 78.8 kg 80.6 kg 79.3 kg    Examination: General exam: Appears weak and chronically  ill-appearing without distress Respiratory system: Diminished breath sounds bilaterally without wheeze or rhonchi Cardiovascular system: S1 & S2 heard, RRR. No pedal edema. Gastrointestinal system: Abdomen is nondistended, soft and nontender.  Central nervous system: Alert, nonfocal exam Extremities: Symmetric in appearance bilaterally  Skin: No rashes, lesions or ulcers on exposed skin  Psychiatry: Judgement and insight appear stable.   Data Reviewed: I have personally reviewed following labs and imaging studies  CBC: Recent Labs  Lab 05/07/20 0207 05/08/20 0350 05/09/20 0957 05/10/20 0603 05/11/20 5176 05/12/20 0311 05/13/20 0204  WBC 10.7* 9.9 11.4* 12.7* 11.3* 12.0* 9.4  NEUTROABS 8.1* 7.4 8.6* 9.4* 7.8*  --   --   HGB 11.3* 11.0* 11.8* 11.6* 11.0* 11.4* 11.5*  HCT 35.6* 33.8* 35.9* 34.8* 34.7* 36.1* 37.3*  MCV 93.2 93.6 92.5 92.1 93.5 94.3 95.4  PLT 392 359 392 448* 463* 497* 160*   Basic Metabolic Panel: Recent Labs  Lab 05/07/20 0207 05/08/20 0350 05/09/20 0957 05/10/20 0603 05/11/20 0643 05/12/20 0311 05/13/20 0204  NA 146* 144 136 136 136 137 136  K 4.7 4.5 4.6 4.5 4.6 4.8 4.3  CL 111 108 102 102 105 103 105  CO2 25 26 23 25 24 25 23   GLUCOSE 114* 132* 121* 119* 133* 112* 103*  BUN 38* 36* 32* 31* 29* 26* 26*  CREATININE 0.97 0.99 0.77 0.80 0.79 0.78 0.77  CALCIUM 8.5* 8.5* 8.3* 8.2* 8.2* 8.4* 8.2*  MG 2.1 2.0 2.0 2.0 2.0  --   --   PHOS 4.1 3.6 3.3 3.3 3.1  --   --    GFR: Estimated Creatinine Clearance: 81.2 mL/min (by C-G formula based on SCr of 0.77 mg/dL). Liver Function Tests: Recent Labs  Lab 05/09/20 0957 05/10/20 0603 05/11/20 0643 05/12/20 0311 05/13/20 0204  AST 56* 47* 44* 46* 41  ALT 87* 79* 72* 75* 61*  ALKPHOS 40 41 44 48 44  BILITOT 0.7 0.7 1.0 0.9 1.0  PROT 5.8* 5.7* 5.8* 6.0* 5.9*  ALBUMIN 2.7* 2.7* 2.7* 2.8* 2.8*   No results for input(s): LIPASE, AMYLASE in the last 168 hours. No results for input(s): AMMONIA in the last  168 hours. Coagulation Profile: Recent Labs  Lab 05/07/20 0207 05/08/20 0350 05/11/20 1227 05/12/20 1134 05/13/20 0204  INR 1.5* 1.4* 1.2 1.1 1.3*   Cardiac Enzymes: No results for input(s): CKTOTAL, CKMB, CKMBINDEX, TROPONINI in the last 168 hours. BNP (last 3 results) No results for input(s): PROBNP in the last 8760 hours. HbA1C: No results for input(s): HGBA1C in  the last 72 hours. CBG: Recent Labs  Lab 05/12/20 1602 05/12/20 1944 05/12/20 2307 05/13/20 0305 05/13/20 0919  GLUCAP 105* 126* 133* 100* 97   Lipid Profile: No results for input(s): CHOL, HDL, LDLCALC, TRIG, CHOLHDL, LDLDIRECT in the last 72 hours. Thyroid Function Tests: No results for input(s): TSH, T4TOTAL, FREET4, T3FREE, THYROIDAB in the last 72 hours. Anemia Panel: No results for input(s): VITAMINB12, FOLATE, FERRITIN, TIBC, IRON, RETICCTPCT in the last 72 hours. Sepsis Labs: No results for input(s): PROCALCITON, LATICACIDVEN in the last 168 hours.  Recent Results (from the past 240 hour(s))  Culture, Urine     Status: Abnormal   Collection Time: 05/03/20 10:42 AM   Specimen: Urine, Random  Result Value Ref Range Status   Specimen Description URINE, RANDOM  Final   Special Requests   Final    NONE Performed at [facility], [address], [place], [place] [address]    Culture >=100,000 COLONIES/mL KLEBSIELLA OXYTOCA (A)  Final   Report Status 05/05/2020 FINAL  Final   Organism ID, Bacteria KLEBSIELLA OXYTOCA (A)  Final      Susceptibility   Klebsiella oxytoca - MIC*    AMPICILLIN RESISTANT Resistant     CEFAZOLIN 8 SENSITIVE Sensitive     CEFEPIME <=0.12 SENSITIVE Sensitive     CEFTRIAXONE <=0.25 SENSITIVE Sensitive     CIPROFLOXACIN <=0.25 SENSITIVE Sensitive     GENTAMICIN <=1 SENSITIVE Sensitive     IMIPENEM <=0.25 SENSITIVE Sensitive     NITROFURANTOIN <=16 SENSITIVE Sensitive     TRIMETH/SULFA <=20 SENSITIVE Sensitive     AMPICILLIN/SULBACTAM 4 SENSITIVE Sensitive      PIP/TAZO <=4 SENSITIVE Sensitive     * >=100,000 COLONIES/mL KLEBSIELLA OXYTOCA  Culture, blood (routine x 2)     Status: Abnormal   Collection Time: 05/04/20  4:46 PM   Specimen: BLOOD  Result Value Ref Range Status   Specimen Description BLOOD BLOOD LEFT FOREARM  Final   Special Requests   Final    BOTTLES DRAWN AEROBIC ONLY Blood Culture adequate volume   Culture  Setup Time   Final    AEROBIC BOTTLE ONLY GRAM POSITIVE COCCI Organism ID to follow CRITICAL RESULT CALLED TO, READ BACK BY AND VERIFIED WITH: J LEDFORD [facility] 05/06/20 0615 JDW    Culture (A)  Final    STAPHYLOCOCCUS EPIDERMIDIS THE SIGNIFICANCE OF ISOLATING THIS ORGANISM FROM A SINGLE SET OF BLOOD CULTURES WHEN MULTIPLE SETS ARE DRAWN IS UNCERTAIN. PLEASE NOTIFY THE MICROBIOLOGY DEPARTMENT WITHIN ONE WEEK IF SPECIATION AND SENSITIVITIES ARE REQUIRED. Performed at [facility], [place] [address], [place], [place] [address]    Report Status 05/07/2020 FINAL  Final  Blood Culture ID Panel (Reflexed)     Status: Abnormal   Collection Time: 05/04/20  4:46 PM  Result Value Ref Range Status   Enterococcus faecalis NOT DETECTED NOT DETECTED Final   Enterococcus Faecium NOT DETECTED NOT DETECTED Final   Listeria monocytogenes NOT DETECTED NOT DETECTED Final   Staphylococcus species DETECTED (A) NOT DETECTED Final    Comment: CRITICAL RESULT CALLED TO, READ BACK BY AND VERIFIED WITH: J LEDFORD [facility] 05/06/20 0615 JDW    Staphylococcus aureus (BCID) NOT DETECTED NOT DETECTED Final   Staphylococcus epidermidis DETECTED (A) NOT DETECTED Final    Comment: Methicillin (oxacillin) resistant coagulase negative staphylococcus. Possible blood culture contaminant (unless isolated from more than one blood culture draw or clinical case suggests pathogenicity). No antibiotic treatment is indicated for blood  culture contaminants.  CRITICAL RESULT CALLED TO, READ BACK BY AND VERIFIED WITH: J LEDFORD [facility] 05/06/20 0615 JDW     Staphylococcus lugdunensis NOT DETECTED NOT DETECTED Final   Streptococcus species NOT DETECTED NOT DETECTED Final   Streptococcus agalactiae NOT DETECTED NOT DETECTED Final   Streptococcus pneumoniae NOT DETECTED NOT DETECTED Final   Streptococcus pyogenes NOT DETECTED NOT DETECTED Final   A.calcoaceticus-baumannii NOT DETECTED NOT DETECTED Final   Bacteroides fragilis NOT DETECTED NOT DETECTED Final   Enterobacterales NOT DETECTED NOT DETECTED Final   Enterobacter cloacae complex NOT DETECTED NOT DETECTED Final   Escherichia coli NOT DETECTED NOT DETECTED Final   Klebsiella aerogenes NOT DETECTED NOT DETECTED Final   Klebsiella oxytoca NOT DETECTED NOT DETECTED Final   Klebsiella pneumoniae NOT DETECTED NOT DETECTED Final   Proteus species NOT DETECTED NOT DETECTED Final   Salmonella species NOT DETECTED NOT DETECTED Final   Serratia marcescens NOT DETECTED NOT DETECTED Final   Haemophilus influenzae NOT DETECTED NOT DETECTED Final   Neisseria meningitidis NOT DETECTED NOT DETECTED Final   Pseudomonas aeruginosa NOT DETECTED NOT DETECTED Final   Stenotrophomonas maltophilia NOT DETECTED NOT DETECTED Final   Candida albicans NOT DETECTED NOT DETECTED Final   Candida auris NOT DETECTED NOT DETECTED Final   Candida glabrata NOT DETECTED NOT DETECTED Final   Candida krusei NOT DETECTED NOT DETECTED Final   Candida parapsilosis NOT DETECTED NOT DETECTED Final   Candida tropicalis NOT DETECTED NOT DETECTED Final   Cryptococcus neoformans/gattii NOT DETECTED NOT DETECTED Final   Methicillin resistance mecA/C DETECTED (A) NOT DETECTED Final    Comment: CRITICAL RESULT CALLED TO, READ BACK BY AND VERIFIED WITHLenna Sciara [facility] 05/06/20 0615 JDW Performed at [facility], [address] [place], [place] [address]   Culture, blood (routine x 2)     Status: None   Collection Time: 05/04/20  4:52 PM   Specimen: BLOOD LEFT HAND  Result Value Ref Range Status   Specimen Description  BLOOD LEFT HAND  Final   Special Requests   Final    BOTTLES DRAWN AEROBIC ONLY Blood Culture adequate volume   Culture   Final    NO GROWTH 5 DAYS Performed at [facility], [place] [address], [place], [place] [address]    Report Status 05/09/2020 FINAL  Final  Culture, blood (routine x 2)     Status: None   Collection Time: 05/06/20  9:00 AM   Specimen: BLOOD  Result Value Ref Range Status   Specimen Description BLOOD RIGHT ANTECUBITAL  Final   Special Requests   Final    BOTTLES DRAWN AEROBIC AND ANAEROBIC Blood Culture adequate volume   Culture   Final    NO GROWTH 5 DAYS Performed at [facility], [place] [address], [place], [place] [address]    Report Status 05/11/2020 FINAL  Final  Culture, blood (routine x 2)     Status: None   Collection Time: 05/06/20  9:03 AM   Specimen: BLOOD RIGHT ARM  Result Value Ref Range Status   Specimen Description BLOOD RIGHT ARM  Final   Special Requests   Final    BOTTLES DRAWN AEROBIC AND ANAEROBIC Blood Culture adequate volume   Culture   Final    NO GROWTH 5 DAYS Performed at [facility], [place] [address] [place], [place] [address]    Report Status 05/11/2020 FINAL  Final  Surgical PCR screen     Status: Abnormal   Collection  Time: 05/12/20 12:25 AM   Specimen: Nasal Mucosa; Nasal Swab  Result Value Ref Range Status   MRSA, PCR NEGATIVE NEGATIVE Final   Staphylococcus aureus POSITIVE (A) NEGATIVE Final    Comment: (NOTE) The Xpert SA Assay (FDA approved for NASAL specimens in patients 73 years of age and older), is one component of a comprehensive surveillance program. It is not intended to diagnose infection nor to guide or monitor treatment. Performed at [facility], [place] [address] [place], [place] [address]       Radiology Studies: IR GASTROSTOMY TUBE MOD SED  Result Date: 05/12/2020 INDICATION: 80 year old with cerebral infarcts and dysphagia. EXAM: PERCUTANEOUS GASTROSTOMY TUBE WITH FLUOROSCOPIC  GUIDANCE Physician: Stephan Minister. Henn, MD MEDICATIONS: Ancef 2 g, glucagon 1 mg ANESTHESIA/SEDATION: Versed 2.0 mg IV; Fentanyl 50 mcg IV Moderate Sedation Time:  18 minutes The patient was continuously monitored during the procedure by the interventional radiology nurse under my direct supervision. FLUOROSCOPY TIME:  Fluoroscopy Time: 5 minutes, 42 seconds, 30 mGy COMPLICATIONS: None immediate. PROCEDURE: Informed consent was obtained for a percutaneous gastrostomy tube. The patient was placed on the interventional table. Fluoroscopy demonstrated oral contrast in the transverse colon. An orogastric tube was placed with fluoroscopic guidance. The anterior abdomen was prepped and draped in sterile fashion. Maximal barrier sterile technique was utilized including caps, mask, sterile gowns, sterile gloves, sterile drape, hand hygiene and skin antiseptic. Stomach was inflated with air through the orogastric tube. The skin and subcutaneous tissues were anesthetized with 1% lidocaine. A 17 gauge needle was directed into the distended stomach with fluoroscopic guidance. A wire was advanced into the stomach. A 9-French vascular sheath was placed and the orogastric tube was snared using a [employer]os[employer]ck snare device. The orogastric tube and snare were pulled out of the patient's mouth. The snare device was connected to a 20-French gastrostomy tube. The snare device and gastrostomy tube were pulled through the patient's mouth and out the anterior abdominal wall. The gastrostomy tube was cut to an appropriate length. Contrast injection through gastrostomy tube confirmed placement within the stomach. Fluoroscopic images were obtained for documentation. The gastrostomy tube was flushed with normal saline. IMPRESSION: Successful fluoroscopic guided percutaneous gastrostomy tube placement. Electronically Signed   By: Markus Daft M.D.   On: 05/12/2020 16:00      Scheduled Meds: . aspirin  81 mg Per Tube Daily  . carvedilol  12.5 mg Per  Tube BID WC  . cephALEXin  500 mg Per Tube Q8H  . Chlorhexidine Gluconate Cloth  6 each Topical Daily  . clopidogrel  75 mg Per Tube Daily  . feeding supplement (PROSource TF)  45 mL Per Tube BID  . finasteride  5 mg Oral Daily  . insulin aspart  0-9 Units Subcutaneous Q4H  . mupirocin ointment  1 application Nasal BID  . pravastatin  40 mg Per Tube Daily  . Warfarin - Pharmacist Dosing Inpatient   Does not apply q1600   Continuous Infusions: . dextrose 5 % and 0.9% NaCl 50 mL/hr at 05/12/20 2130  . feeding supplement (JEVITY 1.5 CAL/FIBER) 55 mL/hr at 05/11/20 1708  . heparin 1,400 Units/hr (05/12/20 2327)     LOS: 22 days      Time spent: 25 minutes   Dessa Phi, DO [facility] 05/13/2020, 10:28 AM   Available via Epic secure chat 7am-7pm After these hours, please refer to coverage provider listed on [email or web address]

## 2020-05-13 NOTE — Progress Notes (Signed)
Occupational Therapy Treatment Patient Details Name: John Lambert MRN: [id number] DOB: 1940/10/01 Today's Date: 05/13/2020    History of present illness John Lambert is a 80 y.o. male admitted 12/14 with bil LE weakness and difficulty walking as well as left hip pain. Imaging notable for Numerous scattered punctate acute infarctions in the left  posterior frontal region consistent with micro embolic infarctions. Punctate acute infarction also affecting the splenium of  the corpus callosum.  Pt with L carotid artery stenosis s/p stent on 04/27/20. Post procedure pt with right hemiparesis, aphasia and left gaze with intraprocedural stroke. Code stroke called with CT head (-) for acute findings. Emergent L carotid diagnostic cerebral cath angiogram (-) for acute findings. MRI 12/23 (+) patchy left basal ganglia as well as left frontoparietal cortical and subcortical infarcts. 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   OT comments  Pt received in bed, reluctantly agreeable to OT session. Pt required totalA for oral care using RUE, he demonstrated ability to complete oral care using LUE with decreased coordination. Pt declined bed mobility this session, explained the importance of mobility progression and participation with OT/PT. Pt required assistance with general BUE HEP. Pt will continue to benefit from skilled OT services to maximize safety and independence with ADL/IADL and functional mobility. Will continue to follow acutely and progress as tolerated.    Follow Up Recommendations  SNF    Equipment Recommendations  Other (comment) (TBD)    Recommendations for Other Services Rehab consult    Precautions / Restrictions Precautions Precautions: Fall Precaution Comments: cortrak, R hemi Restrictions Weight Bearing Restrictions: No       Mobility Bed Mobility Overal bed mobility: Needs Assistance Bed Mobility:  Rolling Rolling: Total assist         General bed mobility comments: attempted rolling, pt resistant  Transfers                 General transfer comment: deferred    Balance       Sitting balance - Comments: deferred                                   ADL either performed or assessed with clinical judgement   ADL Overall ADL's : Needs assistance/impaired     Grooming: Maximal assistance;Bed level Grooming Details (indicate cue type and reason): provided hand over hand asisstance with suction toothbrush                               General ADL Comments: pt self limiting;required hand over hand assistance to complete oral care     Vision   Vision Assessment?: Vision impaired- to be further tested in functional context Additional Comments: difficult to assess, pt declined donning glasses   Perception     Praxis      Cognition Arousal/Alertness: Lethargic;Awake/alert (moments of wakefulness and moments of lethargy) Behavior During Therapy: Flat affect Overall Cognitive Status: Difficult to assess Area of Impairment: Attention;Following commands;Problem solving;Safety/judgement;Awareness;Memory                   Current Attention Level: Alternating Memory: Decreased short-term memory Following Commands: Follows one step commands with increased time;Follows one step commands inconsistently Safety/Judgement: Decreased awareness of deficits Awareness: Emergent Problem Solving: Slow processing;Decreased initiation;Requires verbal cues;Requires tactile cues General  Comments: pt nonverbal during session, responding no very frequently, did have an occurrence of shrugging his shoulders. Pt followed commands about 15% of the time, resistant to therapy initially, but then cooperative after therapist explained importance of mobility        Exercises Exercises: General Upper Extremity General Exercises - Upper Extremity Shoulder  Flexion: AAROM;PROM;Both;10 reps (AAROM LUE, PROM RUE) Shoulder Extension: AAROM;PROM;Both;10 reps (AAROM LUE, PROM RUE) Elbow Flexion: AAROM;PROM;Both;10 reps (AAROM LUE, PROM RUE) Elbow Extension: AAROM;PROM;Both;10 reps (AAROM LUE, PROM RUE) Wrist Flexion: AAROM;PROM;Both;10 reps (AAROM LUE, PROM RUE) Wrist Extension: AAROM;PROM;Both;10 reps (AAROM LUE, PROM RUE) General Exercises - Lower Extremity Ankle Circles/Pumps: AROM;Both;Supine;10 reps Heel Slides: AROM;Strengthening;Left;5 reps;Right;10 reps;Supine Hand Exercises Forearm Supination: AROM;Right;10 reps;Supine Forearm Pronation: AROM;Right;10 reps;Supine Wrist Flexion: AROM;Right;10 reps;Supine Wrist Extension: AROM;Right;10 reps;Supine Other Exercises Other Exercises: passive R UE/LE PROM due to flaccidity   Shoulder Instructions       General Comments educated pt's wife on playing music and bringing in familiar pictures of family;educated pt's wife on strategies to promote sleep/wake cycle noted cracked K-9 tooth on right side of mouth    Pertinent Vitals/ Pain       Pain Assessment: Faces Faces Pain Scale: Hurts even more Pain Location: generalized discomfort Pain Descriptors / Indicators: Discomfort;Grimacing;Guarding Pain Intervention(s): Limited activity within patient's tolerance;Monitored during session  Home Living                                          Prior Functioning/Environment              Frequency  Min 2X/week        Progress Toward Goals  OT Goals(current goals can now be found in the care plan section)  Progress towards OT goals: Not progressing toward goals - comment (self-limiting)  Acute Rehab OT Goals Patient Stated Goal: daughter present and with increased concerns regarding being able to d/c to SNF OT Goal Formulation: With patient Time For Goal Achievement: 05/27/20 Potential to Achieve Goals: Fair ADL Goals Pt Will Perform Grooming: with min assist;bed  level Pt Will Transfer to Toilet: with modified independence;ambulating Additional ADL Goal #1: Patient will tolerate static unsupported sitting at EOB for >5 minute with Min A. Additional ADL Goal #2: Patient will demonstrate functional transfers with use of LRAD and Mod A.  Plan Discharge plan remains appropriate;Frequency remains appropriate    Co-evaluation                 AM-PAC OT "6 Clicks" Daily Activity     Outcome Measure   Help from another person eating meals?: Total (NPO) Help from another person taking care of personal grooming?: Total Help from another person toileting, which includes using toliet, bedpan, or urinal?: Total Help from another person bathing (including washing, rinsing, drying)?: Total Help from another person to put on and taking off regular upper body clothing?: Total Help from another person to put on and taking off regular lower body clothing?: Total 6 Click Score: 6    End of Session    OT Visit Diagnosis: Unsteadiness on feet (R26.81);Other abnormalities of gait and mobility (R26.89);Muscle weakness (generalized) (M62.81);Pain Pain - Right/Left: Right Pain - part of body: Shoulder;Arm;Hand   Activity Tolerance Patient limited by lethargy;Patient limited by fatigue   Patient Left in bed;with call bell/phone within reach;with bed alarm set;with family/visitor present   Nurse Communication Mobility status  Time: 3532-9924 OT Time Calculation (min): 31 min  Charges: OT General Charges $OT Visit: 1 Visit OT Treatments $Self Care/Home Management : 8-22 mins $Therapeutic Activity: 8-22 mins  Helene Kelp OTR/L Acute Rehabilitation Services Office: [place] 05/13/2020, 4:34 PM

## 2020-05-13 NOTE — Progress Notes (Signed)
John Lambert for heparin to warfarin Indication: on PTA warfarin for hx of DVT/PE, new acute stroke, new Afib  Patient Measurements: Height: 6\' 2"  (188 cm) Weight: 79.3 kg (174 lb 13.2 oz) IBW/kg (Calculated) : 82.2  Heparin weight: 83.1  Vital Signs: Temp: 98 F (36.7 C) (01/07 0817) Temp Source: Oral (01/07 0817) BP: 120/64 (01/07 0817) Pulse Rate: 80 (01/07 0817)  Labs: Recent Labs    05/11/20 0643 05/11/20 1227 05/11/20 1628 05/12/20 0311 05/12/20 1134 05/12/20 2137 05/13/20 0204 05/13/20 1013  HGB 11.0*  --   --  11.4*  --   --  11.5*  --   HCT 34.7*  --   --  36.1*  --   --  37.3*  --   PLT 463*  --   --  497*  --   --  433*  --   LABPROT  --  15.0  --   --  14.2  --  15.4*  --   INR  --  1.2  --   --  1.1  --  1.3*  --   HEPARINUNFRC 0.62  --    < > 0.74* <0.10* 0.23* 0.42 0.62  CREATININE 0.79  --   --  0.78  --   --  0.77  --    < > = values in this interval not displayed.    Estimated Creatinine Clearance: 81.2 mL/min (by C-G formula based on SCr of 0.77 mg/dL).  Assessment: 80 yo male with L symptomatic carotid stenosis.  S/p L carotid TCAR procedure w/ stenting that was complicated by postprocedure stroke.  MRI brain shows patchy L basal ganglia and L frontoparietal cortical and subcortical infarcts (new). No hemorrhagic transformation. New stroke and new onset atrial fibrillation. Previous reported hematuria but appears to have resolved.  PTA warfarin dose: 2.5mg  daily except 1.25mg  on Mondays.  Heparin drip and warfarin resumed s/p PEG on 1/6.  Heparin level is above goal at 0.62 on 1400 units/hr. INR is subtherapeutic at 1.3 but trending up. No bleeding noted, Hgb stable 11s, platelets are elevated.  Goal of Therapy:   Heparin level 0.3-0.5 units/ml (more narrow goal due to recent stroke) INR goal 2-3  Monitor platelets by anticoagulation protocol: Yes    Plan: -Decrease heparin drip to 1300 units/hr -6 hr  heparin level -Repeat warfarin 4mg  PO x1 tonight -Daily INR, heparin level, CBC -Monitor for s/sx of bleeding  Thank you for involving pharmacy in this patient's care.  Renold Genta, PharmD, BCPS Clinical Pharmacist Clinical phone for 05/13/2020 until 3p is [phone] 05/13/2020 12:07 PM  **Pharmacist phone directory can be found on [email or web address] listed under [place]**

## 2020-05-14 ENCOUNTER — Inpatient Hospital Stay (HOSPITAL_COMMUNITY): Payer: Medicare Other

## 2020-05-14 DIAGNOSIS — R059 Cough, unspecified: Secondary | ICD-10-CM | POA: Diagnosis not present

## 2020-05-14 DIAGNOSIS — U071 COVID-19: Secondary | ICD-10-CM | POA: Diagnosis not present

## 2020-05-14 DIAGNOSIS — R531 Weakness: Secondary | ICD-10-CM | POA: Diagnosis not present

## 2020-05-14 DIAGNOSIS — Z7901 Long term (current) use of anticoagulants: Secondary | ICD-10-CM | POA: Diagnosis not present

## 2020-05-14 DIAGNOSIS — R1310 Dysphagia, unspecified: Secondary | ICD-10-CM

## 2020-05-14 DIAGNOSIS — I63132 Cerebral infarction due to embolism of left carotid artery: Secondary | ICD-10-CM | POA: Diagnosis not present

## 2020-05-14 LAB — COMPREHENSIVE METABOLIC PANEL
ALT: 47 U/L — ABNORMAL HIGH (ref 0–44)
AST: 36 U/L (ref 15–41)
Albumin: 2.8 g/dL — ABNORMAL LOW (ref 3.5–5.0)
Alkaline Phosphatase: 40 U/L (ref 38–126)
Anion gap: 9 (ref 5–15)
BUN: 25 mg/dL — ABNORMAL HIGH (ref 8–23)
CO2: 21 mmol/L — ABNORMAL LOW (ref 22–32)
Calcium: 8.3 mg/dL — ABNORMAL LOW (ref 8.9–10.3)
Chloride: 105 mmol/L (ref 98–111)
Creatinine, Ser: 0.74 mg/dL (ref 0.61–1.24)
GFR, Estimated: >60 mL/min (ref >60)
Glucose, Bld: 105 mg/dL — ABNORMAL HIGH (ref 70–99)
Potassium: 4.6 mmol/L (ref 3.5–5.1)
Sodium: 135 mmol/L (ref 135–145)
Total Bilirubin: 0.9 mg/dL (ref 0.3–1.2)
Total Protein: 5.9 g/dL — ABNORMAL LOW (ref 6.5–8.1)

## 2020-05-14 LAB — CBC
HCT: 35.5 % — ABNORMAL LOW (ref 39.0–52.0)
Hemoglobin: 11.2 g/dL — ABNORMAL LOW (ref 13.0–17.0)
MCH: 30.2 pg (ref 26.0–34.0)
MCHC: 31.5 g/dL (ref 30.0–36.0)
MCV: 95.7 fL (ref 80.0–100.0)
Platelets: 422 10*3/uL — ABNORMAL HIGH (ref 150–400)
RBC: 3.71 MIL/uL — ABNORMAL LOW (ref 4.22–5.81)
RDW: 14.4 % (ref 11.5–15.5)
WBC: 7.4 10*3/uL (ref 4.0–10.5)
nRBC: 0 % (ref 0.0–0.2)

## 2020-05-14 LAB — GLUCOSE, CAPILLARY
Glucose-Capillary: 106 mg/dL — ABNORMAL HIGH (ref 70–99)
Glucose-Capillary: 135 mg/dL — ABNORMAL HIGH (ref 70–99)
Glucose-Capillary: 114 mg/dL — ABNORMAL HIGH (ref 70–99)
Glucose-Capillary: 118 mg/dL — ABNORMAL HIGH (ref 70–99)

## 2020-05-14 LAB — PROTIME-INR
INR: 1.2 (ref 0.8–1.2)
Prothrombin Time: 15.1 seconds (ref 11.4–15.2)

## 2020-05-14 LAB — C-REACTIVE PROTEIN: CRP: 5.2 mg/dL — ABNORMAL HIGH (ref <1.0)

## 2020-05-14 LAB — HEPARIN LEVEL (UNFRACTIONATED): Heparin Unfractionated: 0.46 IU/mL (ref 0.30–0.70)

## 2020-05-14 LAB — D-DIMER, QUANTITATIVE: D-Dimer, Quant: 4.63 ug/mL-FEU — ABNORMAL HIGH (ref 0.00–0.50)

## 2020-05-14 LAB — SARS CORONAVIRUS 2 (TAT 6-24 HRS): SARS Coronavirus 2: POSITIVE — AB

## 2020-05-14 LAB — PROCALCITONIN: Procalcitonin: 0.12 ng/mL

## 2020-05-14 IMAGING — DX DG CHEST 1V PORT
1 series · 1 of 1 positions shown · non-contrast
Comparison: Radiograph [DATE].  CT [DATE]

CLINICAL DATA: Dyspnea.

EXAM:
PORTABLE CHEST 1 VIEW

[chest ap]
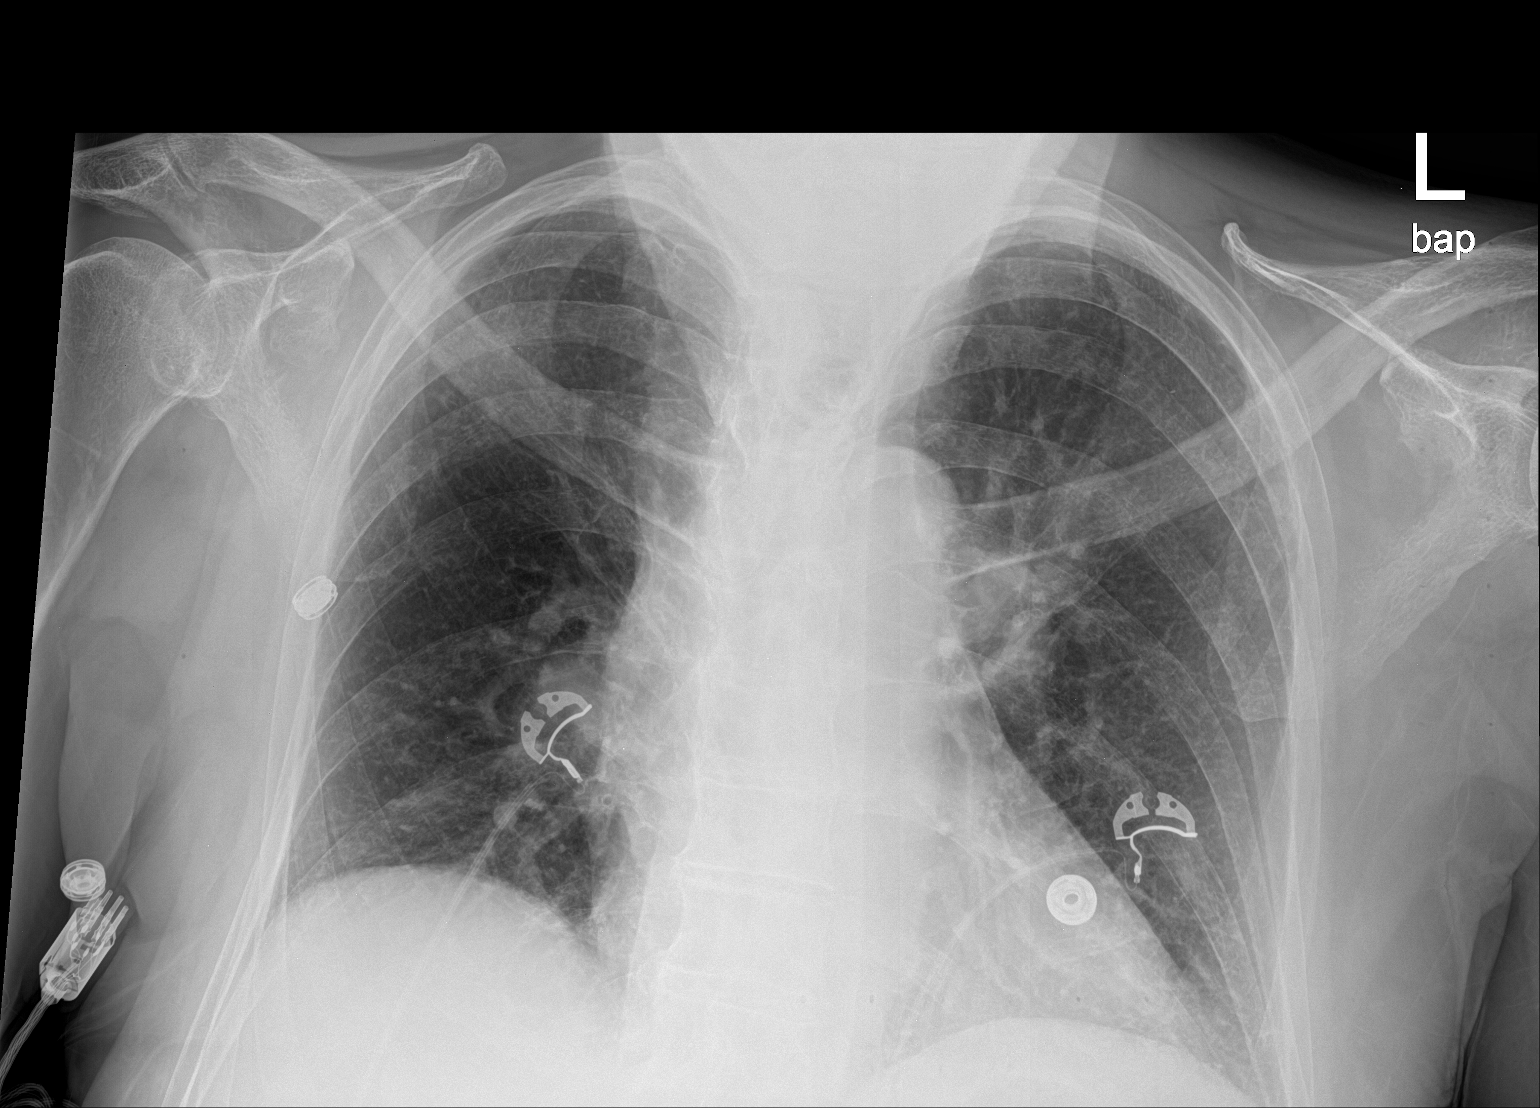

[1 of 1 positions shown; findings below may reference images not displayed]

FINDINGS: Low lung volumes. Normal heart size with stable mediastinal contours
allowing for anti lordotic technique. Bibasilar atelectasis. No
confluent consolidation. No pleural fluid or pneumothorax. No
pulmonary edema. No acute osseous abnormalities are seen.
IMPRESSION: Low lung volumes with bibasilar atelectasis.

## 2020-05-14 MED ORDER — GUAIFENESIN-DM 100-10 MG/5ML PO SYRP
5.0000 mL | ORAL_SOLUTION | Freq: Four times a day (QID) | ORAL | Status: DC
  Administered 2020-05-14 – 2020-05-21 (×29): 5 mL via ORAL
  Filled 2020-05-14 (×30): qty 5

## 2020-05-14 MED ORDER — WARFARIN SODIUM 4 MG PO TABS
4.0000 mg | ORAL_TABLET | Freq: Once | ORAL | Status: AC
  Administered 2020-05-14: 4 mg via ORAL
  Filled 2020-05-14: qty 1

## 2020-05-14 NOTE — Progress Notes (Signed)
Progress Note    LIOR CWIKLINSKI  [id number] DOB: Oct 02, 1940  DOA: 04/19/2020 PCP: Harlan Stains, MD    Brief Narrative:   Chief complaint: Weakness  Medical records reviewed and are as summarized below:  VINCENTE ODER is an 80 y.o. male with PMH of hypertension, melanoma, neurofibroma, clotting disorder, history of DVT and PE on chronic coumadinwhopresented to the ED on 12/14 with c/o bilateral lower extremity weakness and difficulty ambulating associated with pain in the left hip region (has a history of neurofibroma tumor removal from his spine about 4 years ago and melanoma removal from right calf about 30 years ago).   MRI brainnoted numerousscattered punctate acute infarctions in the left posterior frontal region consistent with micro emboli which could either be in the anterior or middle cerebral arteryterritory. Punctate acute infarction also affecting the splenium of the corpus callosum.Extensive chronic small-vessel ischemic changes elsewherethroughout the brain.Small old left parietal vertex cortical and subcorticalInfarctions.  MRI lumbar spine showedchronicarachnoiditis with adhesions in the thecal sac and distorted cauda equina nerve roots from L2-L3 to the sacrum, but no evidence ofrecurrent spinal tumor.Possible right L5/S1 radiculitis after noting L4/5 multifactorial mild to moderate spinal stenosis. Vascular surgery was consulted and took patient for a left transcarotid artery stent on 12/22, reducing stenosis from 80% to 10% with stent.Following surgery, patient noted to have difficulty moving right side, expressive aphasia, and left gaze deviation and code stroke immediately called. Patient taken straight to CT scan. No new acute infarction noted. Patient underwent emergent left carotid diagnostic cerebral catheter angiogram with left MCA embolism noted from previous, but no acute findings. No intervention planned. EEG noted cortical dysfunction left  hemisphere, but no seizures or epileptiform discharges seen throughout recording.  He was transferred to the stroke service on 04/27/2020 and then transferred out to the [facility] service on 04/29/2020. Currently he continues to remain weak and his right upper arm is flaccid. Now having gross hematuria as he has been placed on aspirin/Plavix as well as heparin and Coumadin. Heparin drip has now been stopped given that his Coumadin level therapeutic. Coumadin held given his gross hematuria.Case was discussed with urology Dr. Alexis Frock who recommends obtaining baseline imaging with CT of the abdomen pelvis with and without contrast for hematuria. Unfortunately imaging was not able to be obtained given his worsened renal function so this was done without contrast. This showed possible pyelonephritis and pancreatitis was essentially ruled out given his normal lipase level. Foley catheter was inserted and urine grew out Klebsiella.Renal function is improving now with Foley catheter drainage and he is diuresing fairly well. Patient WBC is trending down as well as his renal function. Coumadin has been resumed.  Due to somnolence, patient underwent repeat MRI which was unchanged and the EEG which still showed encephalopathy which was improved. Scrotal ultrasound obtained due to scrotal edema - likely this is in the setting of his anasarca and extreme volume overload.Urology was consulted and recommend outpatient ultrasound in several months to verify stability. Urology recommends and trial of void prior to discharge for having outpatient trial void in office.   Due to continued poor PO intake and dysphagia, PEG tube was placed 05/12/20. Developed cough 05/13/20 leading to Covid testing which resulted positive.  The patient is largely asymptomatic other than cough. Daughter declines treatment with Remdesivir.  Assessment/Plan:   Principal Problem:   Weakness Active Problems:   Anticoagulated on  warfarin   Impaired ambulation   Fall at home, initial encounter  Essential hypertension   Blood clotting disorder (HCC)   Cerebral embolism with cerebral infarction   Stroke [facility])   Middle cerebral artery embolism, left   COVID-19 virus infection  Principle problem: Acute CVA resulting in aphasia and right hemiplegia -Status post left carotid stent placement complicated by post-op stroke.  Aphasic with right hemiplegia. -Follow-up with vascular surgery Dr. Trula Slade.  -Follow-up with neurology in 4 week.s  -Aspirin, Plavix, Coumadin for 1 month then going to Coumadin and aspirin alone.  Resume heparin (stopped due to bleeding at PEG tube site) until Coumadin therapeutic.  Covid 19+ -Dry cough is currently only symptom. Robitussin DM ordered Q 6 hours. -Not vaccinated. No known exposure, appears to be a hospital-acquired infection. -CXR shows atelectasis only, no PNA. -CRP elevated at 5.2, D-dimer elevated at 4.63, procalcitonin OK at 0.12---no antibiotics indicated. -Follow inflammatory markers. -Offered remdesivir but daughter declines.  Wants to talk to ID about possibility of Ivermectin or Hydroxychloroquin.  ID doc discussed appropriate treatment options but continues to decline remdesivir due to concerns for renal toxicity. Would consider monoclonal antibodies but these are not currently available.  Dysphagia -S/p PEG 05/12/20.  Tube feedings started 05/13/20.  Appears to be tolerating.  Dyspnea -CXR consistent with scarring, atelectasis. -Remains on room air without distress. -Covid tested 05/13/20, results came back +. No current reports of dyspnea, only dry cough.  AKI  -Resolved status post Foley placement  Acute urinary retention -Urology recommending trial of void prior to discharge versus as an outpatient. -Continue Proscar.  Klebsiella UTI -Rocephin --> Keflex.  Hematuria -Resolved.   Scrotal edema, ecchymosis -Appreciate urology  evaluation. -Monitoring.  History of blood clotting disorder, PE, DVT -Resume coumadin/heparin.   Hyperlipidemia -Continue pravastatin.  Paroxysmal atrial fibrillation -Resume coumadin/heparin. -Continue Coreg.  Essential hypertension -Continue Coreg, BP stable.  Nutritional status Nutrition Problem: Inadequate oral intake Etiology: dysphagia Signs/Symptoms: NPO status Interventions: Tube feeding  Body mass index is 22.45 kg/m.   Family Communication/Anticipated D/C date and plan/Code Status   DVT prophylaxis: Resume heparin/coumadin. warfarin (COUMADIN) tablet 4 mg   Code Status: Full Code.  Family Communication: Daughter at bedside. Disposition Plan: Status is: Inpatient  Remains inpatient appropriate because:newly diagnosed Covid, newly started on TF/assess for tolerance and await rehab placement.   Dispo: The patient is from: Home              Anticipated d/c is to: SNF              Anticipated d/c date is: 2 days              Patient currently is not medically stable to d/c.  Medical Consultants:    ID  Urology  Vascular Surgery  Neurology  Palliative Care  Radiology   Anti-Infectives:    Keflex 05/11/20--->  Subjective:   Lethargic per daughter.  Not sleeping well.  Dry cough noted yesterday, which prompted a Covid test-turned + last night. She denies that he is short of breath, requests something for his cough. Wants to talk to ID regarding treatment with Ivermectin or Hydroxychloroquine after doing her own research regarding these medications.  Declines my offer to treat with Remdesivir.   Objective:    Vitals:   05/13/20 2015 05/14/20 0015 05/14/20 0811 05/14/20 1321  BP: 116/72 128/74 120/71 108/66  Pulse: 82 78 86 75  Resp: 18 20 (!) 22 20  Temp: 98.9 F (37.2 C) 98.2 F (36.8 C) 98.1 F (36.7 C) 97.6 F (36.4 C)  TempSrc:  Oral Axillary Oral Oral  SpO2: 96% 96% 93% 98%  Weight:      Height:        Intake/Output  Summary (Last 24 hours) at 05/14/2020 1406 Last data filed at 05/14/2020 0817 Gross per 24 hour  Intake 10 ml  Output 575 ml  Net -565 ml   Filed Weights   05/10/20 0324 05/12/20 0421 05/13/20 0256  Weight: 78.8 kg 80.6 kg 79.3 kg    Exam: General: No acute distress. Sleeping. Cardiovascular: Heart sounds show a regular rate, and rhythm. No gallops or rubs. No murmurs. No JVD. Lungs: Clear to auscultation bilaterally with diminished breath sounds. No rales, rhonchi or wheezes. Abdomen: Soft, nontender, nondistended with normal active bowel sounds. No masses. No hepatosplenomegaly. PEG tube present with dried blood on bandages. Neurological: Lethargic, unable to fully assess. Skin: Warm and dry. No rashes or lesions. Extremities: No clubbing or cyanosis. No edema. Pedal pulses 2+. Psychiatric: Mood and affect are flat, unable to fully assess.    Data Reviewed:   I have personally reviewed following labs and imaging studies:  Labs: Labs show the following:   Basic Metabolic Panel: Recent Labs  Lab 05/08/20 0350 05/09/20 0957 05/10/20 0603 05/11/20 0643 05/12/20 0311 05/13/20 0204 05/14/20 0453  NA 144 136 136 136 137 136 135  K 4.5 4.6 4.5 4.6 4.8 4.3 4.6  CL 108 102 102 105 103 105 105  CO2 26 23 25 24 25 23  21*  GLUCOSE 132* 121* 119* 133* 112* 103* 105*  BUN 36* 32* 31* 29* 26* 26* 25*  CREATININE 0.99 0.77 0.80 0.79 0.78 0.77 0.74  CALCIUM 8.5* 8.3* 8.2* 8.2* 8.4* 8.2* 8.3*  MG 2.0 2.0 2.0 2.0  --   --   --   PHOS 3.6 3.3 3.3 3.1  --   --   --    GFR Estimated Creatinine Clearance: 81.2 mL/min (by C-G formula based on SCr of 0.74 mg/dL). Liver Function Tests: Recent Labs  Lab 05/10/20 0603 05/11/20 0643 05/12/20 0311 05/13/20 0204 05/14/20 0453  AST 47* 44* 46* 41 36  ALT 79* 72* 75* 61* 47*  ALKPHOS 41 44 48 44 40  BILITOT 0.7 1.0 0.9 1.0 0.9  PROT 5.7* 5.8* 6.0* 5.9* 5.9*  ALBUMIN 2.7* 2.7* 2.8* 2.8* 2.8*   Coagulation profile Recent Labs  Lab  05/08/20 0350 05/11/20 1227 05/12/20 1134 05/13/20 0204 05/14/20 0453  INR 1.4* 1.2 1.1 1.3* 1.2    CBC: Recent Labs  Lab 05/08/20 0350 05/09/20 0957 05/10/20 0603 05/11/20 0643 05/12/20 0311 05/13/20 0204 05/14/20 0453  WBC 9.9 11.4* 12.7* 11.3* 12.0* 9.4 7.4  NEUTROABS 7.4 8.6* 9.4* 7.8*  --   --   --   HGB 11.0* 11.8* 11.6* 11.0* 11.4* 11.5* 11.2*  HCT 33.8* 35.9* 34.8* 34.7* 36.1* 37.3* 35.5*  MCV 93.6 92.5 92.1 93.5 94.3 95.4 95.7  PLT 359 392 448* 463* 497* 433* 422*   CBG: Recent Labs  Lab 05/13/20 2101 05/13/20 2312 05/14/20 0511 05/14/20 0814 05/14/20 1302  GLUCAP 103* 117* 106* 135* 114*   D-Dimer: Recent Labs    05/14/20 1049  DDIMER 4.63*   Microbiology Recent Results (from the past 240 hour(s))  Culture, blood (routine x 2)     Status: Abnormal   Collection Time: 05/04/20  4:46 PM   Specimen: BLOOD  Result Value Ref Range Status   Specimen Description BLOOD BLOOD LEFT FOREARM  Final   Special Requests   Final    BOTTLES  DRAWN AEROBIC ONLY Blood Culture adequate volume   Culture  Setup Time   Final    AEROBIC BOTTLE ONLY GRAM POSITIVE COCCI Organism ID to follow CRITICAL RESULT CALLED TO, READ BACK BY AND VERIFIED WITH: J LEDFORD [facility] 05/06/20 0615 JDW    Culture (A)  Final    STAPHYLOCOCCUS EPIDERMIDIS THE SIGNIFICANCE OF ISOLATING THIS ORGANISM FROM A SINGLE SET OF BLOOD CULTURES WHEN MULTIPLE SETS ARE DRAWN IS UNCERTAIN. PLEASE NOTIFY THE MICROBIOLOGY DEPARTMENT WITHIN ONE WEEK IF SPECIATION AND SENSITIVITIES ARE REQUIRED. Performed at [facility], [place] [address], [place], [place] [address]    Report Status 05/07/2020 FINAL  Final  Blood Culture ID Panel (Reflexed)     Status: Abnormal   Collection Time: 05/04/20  4:46 PM  Result Value Ref Range Status   Enterococcus faecalis NOT DETECTED NOT DETECTED Final   Enterococcus Faecium NOT DETECTED NOT DETECTED Final   Listeria monocytogenes NOT DETECTED NOT DETECTED Final    Staphylococcus species DETECTED (A) NOT DETECTED Final    Comment: CRITICAL RESULT CALLED TO, READ BACK BY AND VERIFIED WITH: J LEDFORD [facility] 05/06/20 0615 JDW    Staphylococcus aureus (BCID) NOT DETECTED NOT DETECTED Final   Staphylococcus epidermidis DETECTED (A) NOT DETECTED Final    Comment: Methicillin (oxacillin) resistant coagulase negative staphylococcus. Possible blood culture contaminant (unless isolated from more than one blood culture draw or clinical case suggests pathogenicity). No antibiotic treatment is indicated for blood  culture contaminants. CRITICAL RESULT CALLED TO, READ BACK BY AND VERIFIED WITH: J LEDFORD [facility] 05/06/20 0615 JDW    Staphylococcus lugdunensis NOT DETECTED NOT DETECTED Final   Streptococcus species NOT DETECTED NOT DETECTED Final   Streptococcus agalactiae NOT DETECTED NOT DETECTED Final   Streptococcus pneumoniae NOT DETECTED NOT DETECTED Final   Streptococcus pyogenes NOT DETECTED NOT DETECTED Final   A.calcoaceticus-baumannii NOT DETECTED NOT DETECTED Final   Bacteroides fragilis NOT DETECTED NOT DETECTED Final   Enterobacterales NOT DETECTED NOT DETECTED Final   Enterobacter cloacae complex NOT DETECTED NOT DETECTED Final   Escherichia coli NOT DETECTED NOT DETECTED Final   Klebsiella aerogenes NOT DETECTED NOT DETECTED Final   Klebsiella oxytoca NOT DETECTED NOT DETECTED Final   Klebsiella pneumoniae NOT DETECTED NOT DETECTED Final   Proteus species NOT DETECTED NOT DETECTED Final   Salmonella species NOT DETECTED NOT DETECTED Final   Serratia marcescens NOT DETECTED NOT DETECTED Final   Haemophilus influenzae NOT DETECTED NOT DETECTED Final   Neisseria meningitidis NOT DETECTED NOT DETECTED Final   Pseudomonas aeruginosa NOT DETECTED NOT DETECTED Final   Stenotrophomonas maltophilia NOT DETECTED NOT DETECTED Final   Candida albicans NOT DETECTED NOT DETECTED Final   Candida auris NOT DETECTED NOT DETECTED Final   Candida glabrata NOT  DETECTED NOT DETECTED Final   Candida krusei NOT DETECTED NOT DETECTED Final   Candida parapsilosis NOT DETECTED NOT DETECTED Final   Candida tropicalis NOT DETECTED NOT DETECTED Final   Cryptococcus neoformans/gattii NOT DETECTED NOT DETECTED Final   Methicillin resistance mecA/C DETECTED (A) NOT DETECTED Final    Comment: CRITICAL RESULT CALLED TO, READ BACK BY AND VERIFIED WITHLenna Sciara [facility] 05/06/20 0615 JDW Performed at [facility], [address], [place], [place] [address]   Culture, blood (routine x 2)     Status: None   Collection Time: 05/04/20  4:52 PM   Specimen: BLOOD LEFT HAND  Result Value Ref Range Status   Specimen Description BLOOD LEFT HAND  Final  Special Requests   Final    BOTTLES DRAWN AEROBIC ONLY Blood Culture adequate volume   Culture   Final    NO GROWTH 5 DAYS Performed at [facility], [place] [address], [place], [place] [address]    Report Status 05/09/2020 FINAL  Final  Culture, blood (routine x 2)     Status: None   Collection Time: 05/06/20  9:00 AM   Specimen: BLOOD  Result Value Ref Range Status   Specimen Description BLOOD RIGHT ANTECUBITAL  Final   Special Requests   Final    BOTTLES DRAWN AEROBIC AND ANAEROBIC Blood Culture adequate volume   Culture   Final    NO GROWTH 5 DAYS Performed at [facility], [place] [address] [place], [place] [address]    Report Status 05/11/2020 FINAL  Final  Culture, blood (routine x 2)     Status: None   Collection Time: 05/06/20  9:03 AM   Specimen: BLOOD RIGHT ARM  Result Value Ref Range Status   Specimen Description BLOOD RIGHT ARM  Final   Special Requests   Final    BOTTLES DRAWN AEROBIC AND ANAEROBIC Blood Culture adequate volume   Culture   Final    NO GROWTH 5 DAYS Performed at [facility], [place] [address], [place], [place] [address]    Report Status 05/11/2020 FINAL  Final  Surgical PCR screen     Status: Abnormal   Collection Time: 05/12/20 12:25 AM   Specimen:  Nasal Mucosa; Nasal Swab  Result Value Ref Range Status   MRSA, PCR NEGATIVE NEGATIVE Final   Staphylococcus aureus POSITIVE (A) NEGATIVE Final    Comment: (NOTE) The Xpert SA Assay (FDA approved for NASAL specimens in patients 37 years of age and older), is one component of a comprehensive surveillance program. It is not intended to diagnose infection nor to guide or monitor treatment. Performed at [facility], [place] [address] [place], [place] [address]   SARS CORONAVIRUS 2 (TAT 6-24 HRS) Nasopharyngeal Nasopharyngeal Swab     Status: Abnormal   Collection Time: 05/13/20 12:03 AM   Specimen: Nasopharyngeal Swab  Result Value Ref Range Status   SARS Coronavirus 2 POSITIVE (A) NEGATIVE Final    Comment: (NOTE) SARS-CoV-2 target nucleic acids are DETECTED.  The SARS-CoV-2 RNA is generally detectable in upper and lower respiratory specimens during the acute phase of infection. Positive results are indicative of the presence of SARS-CoV-2 RNA. Clinical correlation with patient history and other diagnostic information is  necessary to determine patient infection status. Positive results do not rule out bacterial infection or co-infection with other viruses.  The expected result is Negative.  Fact Sheet for Patients: SugarRoll.be  Fact Sheet for Healthcare Providers: https://www.woods-mathews.com/  This test is not yet approved or cleared by the [place] FDA and  has been authorized for detection and/or diagnosis of SARS-CoV-2 by FDA under an Emergency Use Authorization (EUA). This EUA will remain  in effect (meaning this test can be used) for the duration of the COVID-19 declaration under Section 564(b)(1) of the Act, 21 U. S.C. section 360bbb-3(b)(1), unless the authorization is terminated or revoked sooner.   Performed at [facility], [place] [address], [place], [place] [address]     Procedures and diagnostic  studies:  DG CHEST PORT 1 VIEW  Result Date: 05/14/2020 CLINICAL DATA:  Dyspnea. EXAM: PORTABLE CHEST 1 VIEW COMPARISON:  Radiograph 05/09/2020.  CT 05/04/2020 FINDINGS: Low lung volumes.  Normal heart size with stable mediastinal contours allowing for anti lordotic technique. Bibasilar atelectasis. No confluent consolidation. No pleural fluid or pneumothorax. No pulmonary edema. No acute osseous abnormalities are seen. IMPRESSION: Low lung volumes with bibasilar atelectasis. Electronically Signed   By: Keith Rake M.D.   On: 05/14/2020 00:58    Medications:   . aspirin  81 mg Per Tube Daily  . carvedilol  12.5 mg Per Tube BID WC  . Chlorhexidine Gluconate Cloth  6 each Topical Daily  . clopidogrel  75 mg Per Tube Daily  . feeding supplement (PROSource TF)  45 mL Per Tube BID  . finasteride  5 mg Oral Daily  . guaiFENesin-dextromethorphan  5 mL Oral Q6H  . insulin aspart  0-9 Units Subcutaneous Q4H  . mupirocin ointment  1 application Nasal BID  . pravastatin  40 mg Per Tube Daily  . warfarin  4 mg Oral ONCE-1600  . Warfarin - Pharmacist Dosing Inpatient   Does not apply q1600   Continuous Infusions: . feeding supplement (JEVITY 1.5 CAL/FIBER) 1,000 mL (05/14/20 1317)  . heparin Stopped (05/13/20 1447)     LOS: 23 days   Jacquelynn Cree, MD  [facility]   [facility] How to contact the [facility] Attending or Consulting provider [place] or covering provider during after hours [place], for this patient?  1. Check the care team in [facility] and look for a) attending/consulting [facility] provider listed and b) the [facility] team listed 2. Log into [email or web address] and use [place]'s universal password to access. If you do not have the password, please contact the hospital operator. 3. Locate the [facility] provider you are looking for under [facility] and page to a number that you can be directly reached. 4. If you still have difficulty reaching the provider, please page the [facility] (Director  on Call) for the Hospitalists listed on amion for assistance.  05/14/2020, 2:06 PM

## 2020-05-14 NOTE — Consult Note (Signed)
[facility] for Infectious Disease    Date of Admission:  04/19/2020     Reason for Consult:  Covid 67     Referring Physician: Dr Rockne Menghini   ASSESSMENT:    80 year old man with multiple medical comorbidities on hospital day 25 for acute CVA who has subsequently developed COVID-19 while in the hospital.  Currently saturating on room air without significant respiratory findings and chest x-ray unremarkable.  Other serologic markers are pending at this time.  Discussed with daughter at bedside and her husband via phone that a positive PCR test generally confirms the diagnosis of COVID-19 especially given his negative test earlier this admission and that I would not consider this a false positive especially in the hospital setting.    Discussed current treatment options available.  Fortunately, he is not hypoxic and requiring further treatment such as steroids.  Discussed remdesivir and concerns they have for renal failure.  I would feel comfortable administering recommended 5-day course in his situation, but they currently decline.  They asked about monoclonal antibodies while inpatient.  I discussed further with hospitalist and currently this is not available for inpatient used due to availability.  They asked about alternative therapies (i.e. vitamin infusions, hydroxychloroquine, and/or ivermectin).  I told him that use of any of those therapeutics would be going against guideline recommendations and has not proven to be of any benefit for the treatment of COVID and I would not recommend this.  PLAN:    --Continue supportive care --Recommend standard of care remdesivir, however, patient and family decline --Could be a good candidate for monoclonal antibody infusion if available, however, this is not currently the case --Hopefully, he continues to be relatively asymptomatic and does well from this standpoint, however, he is at high risk for decompensation given his significant medical  comorbidities and he is unvaccinated --I will not continue to follow, but please call with questions   MEDICATIONS:    Scheduled Meds: . aspirin  81 mg Per Tube Daily  . carvedilol  12.5 mg Per Tube BID WC  . Chlorhexidine Gluconate Cloth  6 each Topical Daily  . clopidogrel  75 mg Per Tube Daily  . feeding supplement (PROSource TF)  45 mL Per Tube BID  . finasteride  5 mg Oral Daily  . guaiFENesin-dextromethorphan  5 mL Oral Q6H  . insulin aspart  0-9 Units Subcutaneous Q4H  . mupirocin ointment  1 application Nasal BID  . pravastatin  40 mg Per Tube Daily  . warfarin  4 mg Oral ONCE-1600  . Warfarin - Pharmacist Dosing Inpatient   Does not apply q1600    Continuous Infusions: . feeding supplement (JEVITY 1.5 CAL/FIBER) 1,000 mL (05/13/20 1419)  . heparin Stopped (05/13/20 1447)    PRN Meds: acetaminophen **OR** acetaminophen (TYLENOL) oral liquid 160 mg/5 mL **OR** acetaminophen, benzonatate, cyclobenzaprine, ipratropium, levalbuterol  HPI:    John Lambert is a 80 y.o. male with a past medical history of hypertension, melanoma, neurofibroma, DVT on anticoagulation who presented to the ED on December 14 with bilateral lower extremity weakness and difficulty ambulating and found to have acute CVA.  He has been in the hospital since that time and recently developed a cough.  He was retested for COVID 19 which returned positive yesterday.  He had an initial negative PCR test back on December 14.  Inflammatory markers are currently pending, he has been afebrile, and his creatinine is 0.74 with an estimated GFR greater than 60.  He  is currently saturating well on room air and chest x-ray shows low lung volumes with bibasilar atelectasis.  Infectious disease consult requested to discuss further treatment of COVID-19 at family's request to consider alternative therapies to remdesivir.   Past Medical History:  Diagnosis Date  . Blood clotting disorder ([place])   . Blood dyscrasia   .  Cancer ([place])    melanoma removed from right calf  . Hypertension   . Neuromuscular disorder ([place])    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    Social History   Tobacco Use  . Smoking status: Former Research scientist (life sciences)  . Smokeless tobacco: Never Used  Vaping Use  . Vaping Use: Never used  Substance Use Topics  . Alcohol use: Yes    Alcohol/week: 2.0 standard drinks    Types: 2 Glasses of wine per week  . Drug use: Never    History reviewed. No pertinent family history.  Allergies  Allergen Reactions  . Hctz [Hydrochlorothiazide] Other (See Comments)    dizzy  . Hydrocodone Itching  . Vicodin [Hydrocodone-Acetaminophen] Itching    Review of Systems  Constitutional: Negative for chills and fever.  Respiratory: Positive for cough.   Cardiovascular: Negative.   Gastrointestinal: Negative.   All other systems reviewed and are negative.   OBJECTIVE:   Blood pressure 120/71, pulse 86, temperature 98.1 F (36.7 C), temperature source Oral, resp. rate (!) 22, height 6\' 2"  (1.88 m), weight 79.3 kg, SpO2 93 %. Body mass index is 22.45 kg/m.  Physical Exam Constitutional:      Comments: Elderly man, lying in bed, nods his head yes or no to questions from his family members  HENT:     Head: Normocephalic and atraumatic.     Nose: Nose normal.     Mouth/Throat:     Mouth: Mucous membranes are moist.     Pharynx: Oropharynx is clear.  Eyes:     General: No scleral icterus.    Extraocular Movements: Extraocular movements intact.  Pulmonary:     Effort: Pulmonary effort is normal. No respiratory distress.  Neurological:     General: No focal deficit present.       Lab Results & Microbiology Lab Results  Component Value Date   WBC 7.4 05/14/2020   HGB 11.2 (L) 05/14/2020   HCT 35.5 (L) 05/14/2020   MCV 95.7 05/14/2020   PLT 422 (H) 05/14/2020    Lab Results  Component Value Date   NA 135 05/14/2020   K 4.6 05/14/2020   CO2 21 (L) 05/14/2020   GLUCOSE 105 (H) 05/14/2020    BUN 25 (H) 05/14/2020   CREATININE 0.74 05/14/2020   CALCIUM 8.3 (L) 05/14/2020   GFRNONAA >60 05/14/2020    Lab Results  Component Value Date   ALT 47 (H) 05/14/2020   AST 36 05/14/2020   ALKPHOS 40 05/14/2020   BILITOT 0.9 05/14/2020    C-Reactive Protein  No results found for: CRP  Erythrocyte Sedimentation Rate  No results found for: ESRSEDRATE    I have reviewed the micro and lab results in Epic.  Imaging IR GASTROSTOMY TUBE MOD SED  Result Date: 05/12/2020 INDICATION: 80 year old with cerebral infarcts and dysphagia. EXAM: PERCUTANEOUS GASTROSTOMY TUBE WITH FLUOROSCOPIC GUIDANCE Physician: Stephan Minister. Henn, MD MEDICATIONS: Ancef 2 g, glucagon 1 mg ANESTHESIA/SEDATION: Versed 2.0 mg IV; Fentanyl 50 mcg IV Moderate Sedation Time:  18 minutes The patient was continuously monitored during the procedure by the interventional radiology nurse under my direct supervision. FLUOROSCOPY TIME:  Fluoroscopy Time:  5 minutes, 42 seconds, 30 mGy COMPLICATIONS: None immediate. PROCEDURE: Informed consent was obtained for a percutaneous gastrostomy tube. The patient was placed on the interventional table. Fluoroscopy demonstrated oral contrast in the transverse colon. An orogastric tube was placed with fluoroscopic guidance. The anterior abdomen was prepped and draped in sterile fashion. Maximal barrier sterile technique was utilized including caps, mask, sterile gowns, sterile gloves, sterile drape, hand hygiene and skin antiseptic. Stomach was inflated with air through the orogastric tube. The skin and subcutaneous tissues were anesthetized with 1% lidocaine. A 17 gauge needle was directed into the distended stomach with fluoroscopic guidance. A wire was advanced into the stomach. A 9-French vascular sheath was placed and the orogastric tube was snared using a Gooseneck snare device. The orogastric tube and snare were pulled out of the patient's mouth. The snare device was connected to a 20-French  gastrostomy tube. The snare device and gastrostomy tube were pulled through the patient's mouth and out the anterior abdominal wall. The gastrostomy tube was cut to an appropriate length. Contrast injection through gastrostomy tube confirmed placement within the stomach. Fluoroscopic images were obtained for documentation. The gastrostomy tube was flushed with normal saline. IMPRESSION: Successful fluoroscopic guided percutaneous gastrostomy tube placement. Electronically Signed   By: Markus Daft M.D.   On: 05/12/2020 16:00   DG CHEST PORT 1 VIEW  Result Date: 05/14/2020 CLINICAL DATA:  Dyspnea. EXAM: PORTABLE CHEST 1 VIEW COMPARISON:  Radiograph 05/09/2020.  CT 05/04/2020 FINDINGS: Low lung volumes. Normal heart size with stable mediastinal contours allowing for anti lordotic technique. Bibasilar atelectasis. No confluent consolidation. No pleural fluid or pneumothorax. No pulmonary edema. No acute osseous abnormalities are seen. IMPRESSION: Low lung volumes with bibasilar atelectasis. Electronically Signed   By: Keith Rake M.D.   On: 05/14/2020 00:58     Imaging independently reviewed in Epic.  Rayn[facility] for Infectious Disease [facility] [phone] pager 05/14/2020, 11:16 AM

## 2020-05-14 NOTE — Progress Notes (Signed)
ANTICOAGULATION CONSULT NOTE  Pharmacy Consult for heparin to warfarin Indication: on PTA warfarin for hx of DVT/PE, new acute stroke, new Afib  Patient Measurements: Height: 6\' 2"  (188 cm) Weight: 79.3 kg (174 lb 13.2 oz) IBW/kg (Calculated) : 82.2  Heparin weight: 83.1  Vital Signs: Temp: 97.9 F (36.6 C) (01/08 1708) Temp Source: Oral (01/08 1708) BP: 107/65 (01/08 1708) Pulse Rate: 80 (01/08 1708)  Labs: Recent Labs    05/12/20 0311 05/12/20 1134 05/12/20 2137 05/13/20 0204 05/13/20 1013 05/14/20 0453 05/14/20 2018  HGB 11.4*  --   --  11.5*  --  11.2*  --   HCT 36.1*  --   --  37.3*  --  35.5*  --   PLT 497*  --   --  433*  --  422*  --   LABPROT  --  14.2  --  15.4*  --  15.1  --   INR  --  1.1  --  1.3*  --  1.2  --   HEPARINUNFRC 0.74* <0.10*   < > 0.42 0.62  --  0.46  CREATININE 0.78  --   --  0.77  --  0.74  --    < > = values in this interval not displayed.    Estimated Creatinine Clearance: 81.2 mL/min (by C-G formula based on SCr of 0.74 mg/dL).  Assessment: 80 yo male with L symptomatic carotid stenosis.  S/p L carotid TCAR procedure w/ stenting that was complicated by postprocedure stroke.  MRI brain shows patchy L basal ganglia and L frontoparietal cortical and subcortical infarcts (new). No hemorrhagic transformation. New stroke and new onset atrial fibrillation. Previous reported hematuria but appears to have resolved.  PTA warfarin dose: 2.5mg  daily except 1.25mg  on Mondays.  Heparin drip and warfarin resumed s/p PEG on 1/6.   Heparin level therapeutic s/p re-start at 1350 units/hr, however drawn somewhat early and level at upper end with narrow goals  Goal of Therapy:   Heparin level 0.3-0.5 units/ml (more narrow goal due to recent stroke) INR goal 2-3  Monitor platelets by anticoagulation protocol: Yes    Plan: Decrease heparin gtt slightly to 1300 units/hr F/u heparin level with AM labs to confirm  Bertis Ruddy, PharmD Clinical  Pharmacist ED Pharmacist Phone # [phone] 05/14/2020 9:02 PM

## 2020-05-14 NOTE — Progress Notes (Addendum)
ANTICOAGULATION CONSULT NOTE  Pharmacy Consult for heparin to warfarin Indication: on PTA warfarin for hx of DVT/PE, new acute stroke, new Afib  Patient Measurements: Height: 6\' 2"  (188 cm) Weight: 79.3 kg (174 lb 13.2 oz) IBW/kg (Calculated) : 82.2  Heparin weight: 83.1  Vital Signs: Temp: 98.1 F (36.7 C) (01/08 0811) Temp Source: Oral (01/08 0811) BP: 120/71 (01/08 0811) Pulse Rate: 86 (01/08 0811)  Labs: Recent Labs    05/12/20 0311 05/12/20 1134 05/12/20 2137 05/13/20 0204 05/13/20 1013 05/14/20 0453  HGB 11.4*  --   --  11.5*  --  11.2*  HCT 36.1*  --   --  37.3*  --  35.5*  PLT 497*  --   --  433*  --  422*  LABPROT  --  14.2  --  15.4*  --  15.1  INR  --  1.1  --  1.3*  --  1.2  HEPARINUNFRC 0.74* <0.10* 0.23* 0.42 0.62  --   CREATININE 0.78  --   --  0.77  --  0.74    Estimated Creatinine Clearance: 81.2 mL/min (by C-G formula based on SCr of 0.74 mg/dL).  Assessment: 80 yo male with L symptomatic carotid stenosis.  S/p L carotid TCAR procedure w/ stenting that was complicated by postprocedure stroke.  MRI brain shows patchy L basal ganglia and L frontoparietal cortical and subcortical infarcts (new). No hemorrhagic transformation. New stroke and new onset atrial fibrillation. Previous reported hematuria but appears to have resolved.  PTA warfarin dose: 2.5mg  daily except 1.25mg  on Mondays.  Heparin drip and warfarin resumed s/p PEG on 1/6.   Heparin has been held since last PM due to oozing from PEG site. INR is still subtherapeutic at 1.2. He was sensitive to coumadin after a few doses of 5mg  recently. We will repeat another 4mg  today.   Goal of Therapy:   Heparin level 0.3-0.5 units/ml (more narrow goal due to recent stroke) INR goal 2-3  Monitor platelets by anticoagulation protocol: Yes    Plan: -F/u resume heparin -Repeat warfarin 4mg  PO x1 tonight -Daily INR, heparin level, CBC -Monitor for s/sx of bleeding  Onnie Boer, PharmD, BCIDP, AAHIVP,  CPP Infectious Disease Pharmacist 05/14/2020 8:46 AM   Addendum:  Madaline Brilliant to resume heparin per MD. Resume at 1350 units/hr. Check 8 hr HL.  Onnie Boer, PharmD, BCIDP, AAHIVP, CPP Infectious Disease Pharmacist 05/14/2020 11:03 AM

## 2020-05-14 NOTE — TOC Progression Note (Signed)
Transition of Care [facility]) - Progression Note    Patient Details  Name: John Lambert MRN: [id number] Date of Birth: 04-16-1941  Transition of Care [facility] CM/SW [place], [place] Phone Number: 05/14/2020, 10:49 AM  Clinical Narrative:    CSW spoke with Soy at [name] to follow up to see if they could offer a bed. Soy noted they were still determining if they could provide the appropriate care for this pt, and needed to order the tube feeds before they could take him. She stated that she will follow up with weekday CSW on Monday with a determination. SW will continue to follow.   Expected Discharge Plan: [place] Barriers to Discharge: [place] Work up,SNF Pending bed offer  Expected Discharge Plan and Services Expected Discharge Plan: [place] In-house Referral: Clinical Social Work Discharge Planning Services: CM Consult Post Acute Care Choice: [place] arrangements for the past 2 months: Single Family Home                                       Social Determinants of Health (SDOH) Interventions    Readmission Risk Interventions No flowsheet data found.

## 2020-05-15 DIAGNOSIS — Z7901 Long term (current) use of anticoagulants: Secondary | ICD-10-CM | POA: Diagnosis not present

## 2020-05-15 DIAGNOSIS — R531 Weakness: Secondary | ICD-10-CM | POA: Diagnosis not present

## 2020-05-15 DIAGNOSIS — I63132 Cerebral infarction due to embolism of left carotid artery: Secondary | ICD-10-CM | POA: Diagnosis not present

## 2020-05-15 DIAGNOSIS — N179 Acute kidney failure, unspecified: Secondary | ICD-10-CM | POA: Diagnosis not present

## 2020-05-15 LAB — GLUCOSE, CAPILLARY
Glucose-Capillary: 109 mg/dL — ABNORMAL HIGH (ref 70–99)
Glucose-Capillary: 116 mg/dL — ABNORMAL HIGH (ref 70–99)

## 2020-05-15 LAB — BASIC METABOLIC PANEL
Anion gap: 8 (ref 5–15)
BUN: 24 mg/dL — ABNORMAL HIGH (ref 8–23)
CO2: 24 mmol/L (ref 22–32)
Calcium: 8.1 mg/dL — ABNORMAL LOW (ref 8.9–10.3)
Chloride: 103 mmol/L (ref 98–111)
Creatinine, Ser: 0.66 mg/dL (ref 0.61–1.24)
GFR, Estimated: >60 mL/min (ref >60)
Glucose, Bld: 125 mg/dL — ABNORMAL HIGH (ref 70–99)
Potassium: 4.2 mmol/L (ref 3.5–5.1)
Sodium: 135 mmol/L (ref 135–145)

## 2020-05-15 LAB — PROTIME-INR
INR: 1.3 — ABNORMAL HIGH (ref 0.8–1.2)
Prothrombin Time: 15.4 seconds — ABNORMAL HIGH (ref 11.4–15.2)

## 2020-05-15 LAB — CBC
HCT: 33.8 % — ABNORMAL LOW (ref 39.0–52.0)
Hemoglobin: 11.1 g/dL — ABNORMAL LOW (ref 13.0–17.0)
MCH: 30.6 pg (ref 26.0–34.0)
MCHC: 32.8 g/dL (ref 30.0–36.0)
MCV: 93.1 fL (ref 80.0–100.0)
Platelets: 389 10*3/uL (ref 150–400)
RBC: 3.63 MIL/uL — ABNORMAL LOW (ref 4.22–5.81)
RDW: 14.3 % (ref 11.5–15.5)
WBC: 5.2 10*3/uL (ref 4.0–10.5)
nRBC: 0 % (ref 0.0–0.2)

## 2020-05-15 LAB — D-DIMER, QUANTITATIVE: D-Dimer, Quant: 3.31 ug/mL-FEU — ABNORMAL HIGH (ref 0.00–0.50)

## 2020-05-15 LAB — HEPARIN LEVEL (UNFRACTIONATED)
Heparin Unfractionated: 0.67 IU/mL (ref 0.30–0.70)
Heparin Unfractionated: 0.59 IU/mL (ref 0.30–0.70)

## 2020-05-15 LAB — C-REACTIVE PROTEIN: CRP: 3.1 mg/dL — ABNORMAL HIGH (ref <1.0)

## 2020-05-15 MED ORDER — WARFARIN SODIUM 4 MG PO TABS
4.0000 mg | ORAL_TABLET | Freq: Once | ORAL | Status: AC
  Administered 2020-05-15: 4 mg via ORAL
  Filled 2020-05-15: qty 1

## 2020-05-15 MED ORDER — TRAMADOL HCL 50 MG PO TABS
50.0000 mg | ORAL_TABLET | Freq: Two times a day (BID) | ORAL | Status: DC | PRN
  Administered 2020-05-15 – 2020-05-16 (×2): 50 mg
  Filled 2020-05-15 (×2): qty 1

## 2020-05-15 MED ORDER — TRAMADOL 5 MG/ML ORAL SUSPENSION: 25.0000 mg | Freq: Four times a day (QID) | ORAL | Status: DC | PRN

## 2020-05-15 NOTE — Plan of Care (Signed)
  Problem: Clinical Measurements: Goal: Will remain free from infection Outcome: Not Progressing Goal: Respiratory complications will improve Outcome: Not Progressing   Problem: Nutrition: Goal: Adequate nutrition will be maintained Outcome: Not Progressing   Problem: Coping: Goal: Level of anxiety will decrease Outcome: Not Progressing

## 2020-05-15 NOTE — Progress Notes (Signed)
Pts son at bedside with mask off and stated that he just received a call that he was exposed to someone Covid positive last night. He was instructed that he would need to leave hospital. Family now requesting that pt get tested if any symptoms appear.

## 2020-05-15 NOTE — Progress Notes (Signed)
Pt on heparin drip. RN went in to do assessment on pt. Pt bleeding at both IV sites and PEG. Rn stopped drip. MD paged. Pharmacy called and notified drip was stopped as well. Will continue to monitor.

## 2020-05-15 NOTE — Progress Notes (Signed)
Progress Note    John Lambert  [id number] DOB: January 08, 1941  DOA: 04/19/2020 PCP: Harlan Stains, MD    Brief Narrative:   Chief complaint: Weakness  Medical records reviewed and are as summarized below:  John Lambert is an 80 y.o. male with PMH of hypertension, melanoma, neurofibroma, clotting disorder, history of DVT and PE on chronic coumadinwhopresented to the ED on 12/14 with c/o bilateral lower extremity weakness and difficulty ambulating associated with pain in the left hip region (has a history of neurofibroma tumor removal from his spine about 4 years ago and melanoma removal from right calf about 30 years ago).   MRI brainnoted numerousscattered punctate acute infarctions in the left posterior frontal region consistent with micro emboli which could either be in the anterior or middle cerebral arteryterritory. Punctate acute infarction also affecting the splenium of the corpus callosum.Extensive chronic small-vessel ischemic changes elsewherethroughout the brain.Small old left parietal vertex cortical and subcorticalInfarctions.  MRI lumbar spine showedchronicarachnoiditis with adhesions in the thecal sac and distorted cauda equina nerve roots from L2-L3 to the sacrum, but no evidence ofrecurrent spinal tumor.Possible right L5/S1 radiculitis after noting L4/5 multifactorial mild to moderate spinal stenosis. Vascular surgery was consulted and took patient for a left transcarotid artery stent on 12/22, reducing stenosis from 80% to 10% with stent.Following surgery, patient noted to have difficulty moving right side, expressive aphasia, and left gaze deviation and code stroke immediately called. Patient taken straight to CT scan. No new acute infarction noted. Patient underwent emergent left carotid diagnostic cerebral catheter angiogram with left MCA embolism noted from previous, but no acute findings. No intervention planned. EEG noted cortical dysfunction left  hemisphere, but no seizures or epileptiform discharges seen throughout recording.  He was transferred to the stroke service on 04/27/2020 and then transferred out to the [facility] service on 04/29/2020. Currently he continues to remain weak and his right upper arm is flaccid. Now having gross hematuria as he has been placed on aspirin/Plavix as well as heparin and Coumadin. Heparin drip has now been stopped given that his Coumadin level therapeutic. Coumadin held given his gross hematuria.Case was discussed with urology Dr. Alexis Frock who recommends obtaining baseline imaging with CT of the abdomen pelvis with and without contrast for hematuria. Unfortunately imaging was not able to be obtained given his worsened renal function so this was done without contrast. This showed possible pyelonephritis and pancreatitis was essentially ruled out given his normal lipase level. Foley catheter was inserted and urine grew out Klebsiella.Renal function is improving now with Foley catheter drainage and he is diuresing fairly well. Patient WBC is trending down as well as his renal function. Coumadin has been resumed.  Due to somnolence, patient underwent repeat MRI which was unchanged and the EEG which still showed encephalopathy which was improved. Scrotal ultrasound obtained due to scrotal edema - likely this is in the setting of his anasarca and extreme volume overload.Urology was consulted and recommend outpatient ultrasound in several months to verify stability. Urology recommends and trial of void prior to discharge for having outpatient trial void in office.   Due to continued poor PO intake and dysphagia, PEG tube was placed 05/12/20. Developed cough 05/13/20 leading to Covid testing which resulted positive.  The patient is largely asymptomatic other than cough. Daughter declines treatment with Remdesivir.  Assessment/Plan:   Principle problem: Acute CVA resulting in aphasia and right  hemiplegia -Status post left carotid stent placement complicated by post-op stroke.  Aphasic with  right hemiplegia. -Will follow-up with vascular surgery Dr. Trula Slade and neurology in 4 weeks.  -Aspirin, Plavix, Coumadin for 1 month then transition to Coumadin and aspirin alone.  Heparin restarted 05/14/20  (stopped due to bleeding at PEG tube site)--continue until Coumadin therapeutic.  Covid 19+ -Dry cough is currently only symptom. Robitussin DM ordered Q 6 hours. -Not vaccinated. No known exposure, appears to be a hospital-acquired infection. -CXR shows atelectasis only, no PNA. -CRP declining 5.2--->3.1, D-dimer declining 4.63--->3.31, procalcitonin OK at 0.12---no antibiotics indicated. -Follow inflammatory markers for 1 more day. -Offered remdesivir 05/14/20 but daughter declined due to concerns for renal toxicity.  Daughter spoke with ID about possibility of Ivermectin or Hydroxychloroquin.  ID doc discussed available treatment options. Would consider monoclonal antibodies but these are not currently available. Will continue to check on availability of these medications with pharmacy.  Dysphagia -S/p PEG 05/12/20.  Tube feedings started 05/13/20.  Appears to be tolerating.  Thrombocytosis -Platelet count has normalized, elevation likely reactive.  Dyspnea -CXR consistent with scarring, atelectasis. -Remains on room air without distress. -Covid tested 05/13/20, results came back +. No current reports of dyspnea, only dry cough.  AKI  -Resolved status post Foley placement  Acute urinary retention -Urology recommending trial of void prior to discharge versus as an outpatient. -Continue Proscar.  Klebsiella UTI -Rocephin --> Keflex.  Hematuria -Resolved.   Scrotal edema, ecchymosis -Appreciate urology evaluation. -Monitoring.  History of blood clotting disorder, PE, DVT -Resume coumadin/heparin.   Hyperlipidemia -Continue pravastatin.  Paroxysmal atrial  fibrillation -Resume coumadin/heparin. -Continue Coreg.  Essential hypertension -Continue Coreg, BP stable.  Nutritional status Nutrition Problem: Inadequate oral intake Etiology: dysphagia Signs/Symptoms: NPO status Interventions: Tube feeding  Body mass index is 22.45 kg/m.   Family Communication/Anticipated D/C date and plan/Code Status   DVT prophylaxis: Resume heparin/coumadin.   Code Status: Full Code.  Family Communication: Daughter at bedside 05/14/20, no family present today. Disposition Plan: Status is: Inpatient  Remains inpatient appropriate because:newly diagnosed Covid, newly started on TF/assess for tolerance and await rehab placement.   Dispo: The patient is from: Home              Anticipated d/c is to: SNF              Anticipated d/c date is: 1 day              Patient currently is not medically stable to d/c.  Medical Consultants:    ID  Urology  Vascular Surgery  Neurology  Palliative Care  Radiology   Anti-Infectives:    Keflex 05/11/20--->  Subjective:   Complains of pain at PEG tube site unrelieved by tylenol.  Still with dry cough.  No complaints of dyspnea.  Objective:    Vitals:   05/14/20 1708 05/14/20 2150 05/15/20 0022 05/15/20 0459  BP: 107/65 116/66 (!) 99/56 126/79  Pulse: 80 76 68 80  Resp: 18 18 18 18   Temp: 97.9 F (36.6 C) 98.3 F (36.8 C) 98 F (36.7 C) 99.2 F (37.3 C)  TempSrc: Oral Oral Oral Oral  SpO2: 96% 96% 96% 97%  Weight:      Height:        Intake/Output Summary (Last 24 hours) at 05/15/2020 0739 Last data filed at 05/15/2020 0457 Gross per 24 hour  Intake 800 ml  Output 1325 ml  Net -525 ml   Filed Weights   05/10/20 0324 05/12/20 0421 05/13/20 0256  Weight: 78.8 kg 80.6 kg 79.3 kg  Exam: General: No acute distress. Awake but minimally verbal. Cardiovascular: Heart sounds show a regular rate, and rhythm. No gallops or rubs. No murmurs. No JVD. Unchanged. Lungs: Clear to  auscultation bilaterally with diminished breath sounds. No rales, rhonchi or wheezes. Unchanged. Abdomen: Soft, nontender, nondistended with normal active bowel sounds. No masses. No hepatosplenomegaly. PEG tube present with dried blood on bandages. Unchanged. Neurological: More awake today, slow to respond.  Follows commands slowly. Skin: Warm and dry. No rashes or lesions. Unchanged. Extremities: No clubbing or cyanosis. No edema. Pedal pulses 2+. Unchanged. Psychiatric: Mood and affect are flat, insight/judgement likely impaired.    Data Reviewed:   I have personally reviewed following labs and imaging studies:  Labs: Labs show the following:   Basic Metabolic Panel: Recent Labs  Lab 05/09/20 0957 05/10/20 0603 05/11/20 0643 05/12/20 0311 05/13/20 0204 05/14/20 0453 05/15/20 0246  NA 136 136 136 137 136 135 135  K 4.6 4.5 4.6 4.8 4.3 4.6 4.2  CL 102 102 105 103 105 105 103  CO2 23 25 24 25 23  21* 24  GLUCOSE 121* 119* 133* 112* 103* 105* 125*  BUN 32* 31* 29* 26* 26* 25* 24*  CREATININE 0.77 0.80 0.79 0.78 0.77 0.74 0.66  CALCIUM 8.3* 8.2* 8.2* 8.4* 8.2* 8.3* 8.1*  MG 2.0 2.0 2.0  --   --   --   --   PHOS 3.3 3.3 3.1  --   --   --   --    GFR Estimated Creatinine Clearance: 81.2 mL/min (by C-G formula based on SCr of 0.66 mg/dL). Liver Function Tests: Recent Labs  Lab 05/10/20 0603 05/11/20 0643 05/12/20 0311 05/13/20 0204 05/14/20 0453  AST 47* 44* 46* 41 36  ALT 79* 72* 75* 61* 47*  ALKPHOS 41 44 48 44 40  BILITOT 0.7 1.0 0.9 1.0 0.9  PROT 5.7* 5.8* 6.0* 5.9* 5.9*  ALBUMIN 2.7* 2.7* 2.8* 2.8* 2.8*   Coagulation profile Recent Labs  Lab 05/11/20 1227 05/12/20 1134 05/13/20 0204 05/14/20 0453 05/15/20 0246  INR 1.2 1.1 1.3* 1.2 1.3*    CBC: Recent Labs  Lab 05/09/20 0957 05/10/20 0603 05/11/20 [id number] 05/12/20 0311 05/13/20 0204 05/14/20 0453 05/15/20 0246  WBC 11.4* 12.7* 11.3* 12.0* 9.4 7.4 5.2  NEUTROABS 8.6* 9.4* 7.8*  --   --   --   --    HGB 11.8* 11.6* 11.0* 11.4* 11.5* 11.2* 11.1*  HCT 35.9* 34.8* 34.7* 36.1* 37.3* 35.5* 33.8*  MCV 92.5 92.1 93.5 94.3 95.4 95.7 93.1  PLT 392 448* 463* 497* 433* 422* 389   CBG: Recent Labs  Lab 05/14/20 0511 05/14/20 0814 05/14/20 1302 05/14/20 2038 05/15/20 0007  GLUCAP 106* 135* 114* 118* 116*   D-Dimer: Recent Labs    05/14/20 1049 05/15/20 0246  DDIMER 4.63* 3.31*   Microbiology Recent Results (from the past 240 hour(s))  Culture, blood (routine x 2)     Status: None   Collection Time: 05/06/20  9:00 AM   Specimen: BLOOD  Result Value Ref Range Status   Specimen Description BLOOD RIGHT ANTECUBITAL  Final   Special Requests   Final    BOTTLES DRAWN AEROBIC AND ANAEROBIC Blood Culture adequate volume   Culture   Final    NO GROWTH 5 DAYS Performed at [facility], [address] [place], [place] [address]    Report Status 05/11/2020 FINAL  Final  Culture, blood (routine x 2)     Status: None   Collection  Time: 05/06/20  9:03 AM   Specimen: BLOOD RIGHT ARM  Result Value Ref Range Status   Specimen Description BLOOD RIGHT ARM  Final   Special Requests   Final    BOTTLES DRAWN AEROBIC AND ANAEROBIC Blood Culture adequate volume   Culture   Final    NO GROWTH 5 DAYS Performed at [facility], [address] [place], [place] [address]    Report Status 05/11/2020 FINAL  Final  Surgical PCR screen     Status: Abnormal   Collection Time: 05/12/20 12:25 AM   Specimen: Nasal Mucosa; Nasal Swab  Result Value Ref Range Status   MRSA, PCR NEGATIVE NEGATIVE Final   Staphylococcus aureus POSITIVE (A) NEGATIVE Final    Comment: (NOTE) The Xpert SA Assay (FDA approved for NASAL specimens in patients 69 years of age and older), is one component of a comprehensive surveillance program. It is not intended to diagnose infection nor to guide or monitor treatment. Performed at [facility], [place] [address], [place], [place] [address]   SARS CORONAVIRUS 2  (TAT 6-24 HRS) Nasopharyngeal Nasopharyngeal Swab     Status: Abnormal   Collection Time: 05/13/20 12:03 AM   Specimen: Nasopharyngeal Swab  Result Value Ref Range Status   SARS Coronavirus 2 POSITIVE (A) NEGATIVE Final    Comment: (NOTE) SARS-CoV-2 target nucleic acids are DETECTED.  The SARS-CoV-2 RNA is generally detectable in upper and lower respiratory specimens during the acute phase of infection. Positive results are indicative of the presence of SARS-CoV-2 RNA. Clinical correlation with patient history and other diagnostic information is  necessary to determine patient infection status. Positive results do not rule out bacterial infection or co-infection with other viruses.  The expected result is Negative.  Fact Sheet for Patients: SugarRoll.be  Fact Sheet for Healthcare Providers: https://www.woods-mathews.com/  This test is not yet approved or cleared by the [place] FDA and  has been authorized for detection and/or diagnosis of SARS-CoV-2 by FDA under an Emergency Use Authorization (EUA). This EUA will remain  in effect (meaning this test can be used) for the duration of the COVID-19 declaration under Section 564(b)(1) of the Act, 21 U. S.C. section 360bbb-3(b)(1), unless the authorization is terminated or revoked sooner.   Performed at [facility], [place] [address] [place], [place] [address]     Procedures and diagnostic studies:  DG CHEST PORT 1 VIEW  Result Date: 05/14/2020 CLINICAL DATA:  Dyspnea. EXAM: PORTABLE CHEST 1 VIEW COMPARISON:  Radiograph 05/09/2020.  CT 05/04/2020 FINDINGS: Low lung volumes. Normal heart size with stable mediastinal contours allowing for anti lordotic technique. Bibasilar atelectasis. No confluent consolidation. No pleural fluid or pneumothorax. No pulmonary edema. No acute osseous abnormalities are seen. IMPRESSION: Low lung volumes with bibasilar atelectasis. Electronically Signed    By: Keith Rake M.D.   On: 05/14/2020 00:58    Medications:   . aspirin  81 mg Per Tube Daily  . carvedilol  12.5 mg Per Tube BID WC  . Chlorhexidine Gluconate Cloth  6 each Topical Daily  . clopidogrel  75 mg Per Tube Daily  . feeding supplement (PROSource TF)  45 mL Per Tube BID  . finasteride  5 mg Oral Daily  . guaiFENesin-dextromethorphan  5 mL Oral Q6H  . insulin aspart  0-9 Units Subcutaneous Q4H  . mupirocin ointment  1 application Nasal BID  . pravastatin  40 mg Per Tube Daily  . Warfarin - Pharmacist Dosing Inpatient  Does not apply q1600   Continuous Infusions: . feeding supplement (JEVITY 1.5 CAL/FIBER) 1,000 mL (05/14/20 1317)  . heparin 1,200 Units/hr (05/15/20 0457)     LOS: 24 days   Jacquelynn Cree, MD  [facility]   [facility] How to contact the [employer] Attending or Consulting provider [place] or covering provider during after hours [place], for this patient?  1. Check the care team in [facility] and look for a) attending/consulting [facility] provider listed and b) the [facility] team listed 2. Log into [email or web address] and use [place]'s universal password to access. If you do not have the password, please contact the hospital operator. 3. Locate the [facility] provider you are looking for under [facility] and page to a number that you can be directly reached. 4. If you still have difficulty reaching the provider, please page the [facility] (Director on Call) for the Hospitalists listed on amion for assistance.  05/15/2020, 7:39 AM

## 2020-05-15 NOTE — Progress Notes (Addendum)
ANTICOAGULATION CONSULT NOTE  Pharmacy Consult for heparin to warfarin Indication: on PTA warfarin for hx of DVT/PE, new acute stroke, new Afib  Patient Measurements: Height: 6\' 2"  (188 cm) Weight: 79.3 kg (174 lb 13.2 oz) IBW/kg (Calculated) : 82.2  Heparin weight: 83.1  Vital Signs: Temp: 97.8 F (36.6 C) (01/09 0751) Temp Source: Oral (01/09 0751) BP: 118/69 (01/09 0751) Pulse Rate: 73 (01/09 0751)  Labs: Recent Labs    05/13/20 0204 05/13/20 1013 05/14/20 0453 05/14/20 2018 05/15/20 0246  HGB 11.5*  --  11.2*  --  11.1*  HCT 37.3*  --  35.5*  --  33.8*  PLT 433*  --  422*  --  389  LABPROT 15.4*  --  15.1  --  15.4*  INR 1.3*  --  1.2  --  1.3*  HEPARINUNFRC 0.42 0.62  --  0.46 0.67  CREATININE 0.77  --  0.74  --  0.66    Estimated Creatinine Clearance: 81.2 mL/min (by C-G formula based on SCr of 0.66 mg/dL).  Assessment: 80 yo male with L symptomatic carotid stenosis.  S/p L carotid TCAR procedure w/ stenting that was complicated by postprocedure stroke.  MRI brain shows patchy L basal ganglia and L frontoparietal cortical and subcortical infarcts (new). No hemorrhagic transformation. New stroke and new onset atrial fibrillation. Previous reported hematuria but appears to have resolved.  PTA warfarin dose: 2.5mg  daily except 1.25mg  on Mondays.  Heparin drip and warfarin resumed s/p PEG on 1/6.   Heparin drip was adjusted this AM. INR still subtherapeutic at 1.3 today. We will repeat the higher coumadin dose today.   Goal of Therapy:   Heparin level 0.3-0.5 units/ml (more narrow goal due to recent stroke) INR goal 2-3  Monitor platelets by anticoagulation protocol: Yes    Plan: Cont heparin gtt 1200 units/hr Coumadin 4mg  PO x1 F/u heparin level, INR  Onnie Boer, PharmD, BCIDP, AAHIVP, CPP Infectious Disease Pharmacist 05/15/2020 8:22 AM  Addendum  HL came back at 0.59 so we will reduce the rate down to 1100 units/hr. Check a 8 hr HL.  Onnie Boer,  PharmD, BCIDP, AAHIVP, CPP Infectious Disease Pharmacist 05/15/2020 1:09 PM

## 2020-05-15 NOTE — Progress Notes (Signed)
ANTICOAGULATION CONSULT NOTE - Follow Up Consult  Pharmacy Consult for heparin Indication: Afib, CVA, and h/o VTE  Labs: Recent Labs    05/12/20 1134 05/12/20 2137 05/13/20 0204 05/13/20 1013 05/14/20 0453 05/14/20 2018 05/15/20 0246  HGB  --    < > 11.5*  --  11.2*  --  11.1*  HCT  --   --  37.3*  --  35.5*  --  33.8*  PLT  --   --  433*  --  422*  --  389  LABPROT 14.2  --  15.4*  --  15.1  --   --   INR 1.1  --  1.3*  --  1.2  --   --   HEPARINUNFRC <0.10*   < > 0.42 0.62  --  0.46 0.67  CREATININE  --   --  0.77  --  0.74  --  0.66   < > = values in this interval not displayed.    Assessment: 80yo male supratherapeutic on heparin with higher heparin level despite decreased rate; no gtt issues or signs of bleeding per RN.  Goal of Therapy:  Heparin level 0.3-0.5 units/ml   Plan:  Will decrease heparin gtt by 1-2 units/kg/hr to 1200 units/hr and check level in 6 hours.    Wynona Neat, PharmD, BCPS  05/15/2020,3:57 AM

## 2020-05-16 ENCOUNTER — Encounter: Payer: Medicare Other | Admitting: Surgery

## 2020-05-16 DIAGNOSIS — R531 Weakness: Secondary | ICD-10-CM | POA: Diagnosis not present

## 2020-05-16 LAB — GLUCOSE, CAPILLARY
Glucose-Capillary: 102 mg/dL — ABNORMAL HIGH (ref 70–99)
Glucose-Capillary: 107 mg/dL — ABNORMAL HIGH (ref 70–99)
Glucose-Capillary: 109 mg/dL — ABNORMAL HIGH (ref 70–99)
Glucose-Capillary: 114 mg/dL — ABNORMAL HIGH (ref 70–99)
Glucose-Capillary: 116 mg/dL — ABNORMAL HIGH (ref 70–99)
Glucose-Capillary: 117 mg/dL — ABNORMAL HIGH (ref 70–99)
Glucose-Capillary: 119 mg/dL — ABNORMAL HIGH (ref 70–99)
Glucose-Capillary: 125 mg/dL — ABNORMAL HIGH (ref 70–99)
Glucose-Capillary: 128 mg/dL — ABNORMAL HIGH (ref 70–99)

## 2020-05-16 LAB — PROTIME-INR
INR: 1.1 (ref 0.8–1.2)
Prothrombin Time: 14.1 seconds (ref 11.4–15.2)

## 2020-05-16 LAB — CBC
HCT: 37.7 % — ABNORMAL LOW (ref 39.0–52.0)
Hemoglobin: 12.1 g/dL — ABNORMAL LOW (ref 13.0–17.0)
MCH: 30.3 pg (ref 26.0–34.0)
MCHC: 32.1 g/dL (ref 30.0–36.0)
MCV: 94.3 fL (ref 80.0–100.0)
Platelets: 407 10*3/uL — ABNORMAL HIGH (ref 150–400)
RBC: 4 MIL/uL — ABNORMAL LOW (ref 4.22–5.81)
RDW: 14.3 % (ref 11.5–15.5)
WBC: 4.8 10*3/uL (ref 4.0–10.5)
nRBC: 0 % (ref 0.0–0.2)

## 2020-05-16 LAB — C-REACTIVE PROTEIN: CRP: 1.7 mg/dL — ABNORMAL HIGH (ref <1.0)

## 2020-05-16 LAB — HEPARIN LEVEL (UNFRACTIONATED): Heparin Unfractionated: <0.1 IU/mL — ABNORMAL LOW (ref 0.30–0.70)

## 2020-05-16 LAB — D-DIMER, QUANTITATIVE: D-Dimer, Quant: 3.55 ug/mL-FEU — ABNORMAL HIGH (ref 0.00–0.50)

## 2020-05-16 MED ORDER — WARFARIN SODIUM 5 MG PO TABS
5.0000 mg | ORAL_TABLET | Freq: Once | ORAL | Status: AC
  Administered 2020-05-16: 5 mg via ORAL
  Filled 2020-05-16: qty 1

## 2020-05-16 MED ORDER — OXYCODONE HCL 5 MG PO TABS
5.0000 mg | ORAL_TABLET | Freq: Four times a day (QID) | ORAL | Status: DC | PRN
  Administered 2020-05-16: 5 mg via ORAL
  Filled 2020-05-16: qty 1

## 2020-05-16 MED ORDER — ENOXAPARIN SODIUM 80 MG/0.8ML ~~LOC~~ SOLN
75.0000 mg | Freq: Two times a day (BID) | SUBCUTANEOUS | Status: DC
  Administered 2020-05-16 – 2020-05-21 (×10): 75 mg via SUBCUTANEOUS
  Filled 2020-05-16 (×11): qty 0.8

## 2020-05-16 NOTE — Progress Notes (Signed)
[facility] PROGRESS NOTE    John Lambert  [id number] DOB: [birth year] DOA: 04/19/2020 PCP: Harlan Stains, MD      Brief Narrative:  John Lambert is a 80 y.o. 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  Admitted, and found to have 80-99% L ICA stenosis, vascular surgery were consulted and patient had LEFT TCAR on 12/22, after which he developed aphasia, left gaze deviation and RIGHT hemiparesis and CODE STROKE called in the PACU.  Emergent LEFT cerebral angiogram showed no new findings.  Subsequent hospital stay complicated by hematuria due to UTI, urinary retention, dysphagia requiring PEG placement, and incidental COVID infection.  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 Trula Slade on 12/22 Post TCAR recurrent acute embolic CVA with right hemiparesis and aphasia  -Continue aspirin, Plavix and warfarin -Plavix for 1 month, transition to aspirin and warfarin alone at Jan 22  -Continue pravastatin   COVID-19 Mild, cough resolved.  Currently asymptomatic.   -D/c precautions on 1/12    Hypertension BP normal -Continue carvedilol  New onset paroxysmal atrial fibrillation -Continue warfarin -Transition to Lovenox bridge -Continue carvedilol  Gross hematuria, resolved, due to Klebsiella UTI -Continue finasteride  Acute urinary retention Foley placed, urology consulted.  They recommend leaving foley  until outpatient Urology follow up is complete. -Continue foley indwelling -Outpaitent Urology follow up  Recurrent VTE Unclear cause.  On lifelong warfarin.  Family have declined DOAC. -Stop heparin gtt -Start Lovenox bridge -Continue warfarin  Dysphagia S/p PEG placement 1/6 by IR -Continue tube feeds  AKI Resolved           Disposition: Status is: Inpatient  Remains inpatient appropriate because:Unsafe d/c plan   Dispo: The patient is from: Home              Anticipated d/c is to: SNF              Anticipated d/c date is: 1 day              Patient currently is medically stable to d/c.              MDM: The below labs and imaging reports were reviewed and summarized above.  Medication management as above. Blood thinner managed.     DVT prophylaxis: Lovenox warfarin (COUMADIN) tablet 5 mg  Code Status: FULL Family Communication: Daughter and wife by phone    Consultants:   ID  Urology  Vascular Surgery  Neurology  Palliative Care  Radiology    Antimicrobials:   Ceftriaxone 12/28 >>  1/4                   Keflex 1/4 >> 1/7             Subjective: Patient denies cough, dyspnea.  Has some pain by PEG tube, improved with tramadol.  No confusion.  No change in right hemiparesis.  No fever.  Objective: Vitals:  05/15/20 1943 05/16/20 0000 05/16/20 0400 05/16/20 0753  BP: 110/63  118/66 136/81  Pulse:   73   Resp: 16  16 16   Temp: (!) 97.5 F (36.4 C) 98 F (36.7 C) 97.8 F (36.6 C) 98 F (36.7 C)  TempSrc: Oral Oral Oral   SpO2: 95%  96% 99%  Weight:      Height:        Intake/Output Summary (Last 24 hours) at 05/16/2020 1245 Last data filed at 05/16/2020 0436 Gross per 24 hour  Intake 399.29 ml  Output 1700 ml  Net -1300.71 ml   Filed Weights   05/10/20 0324 05/12/20 0421 05/13/20 0256  Weight: 78.8 kg 80.6 kg 79.3 kg    Examination: General appearance: Thin elderly adult male, weak but awake,  makes eye contact, in no distress.   HEENT: Anicteric, conjunctiva pink, lids and lashes normal. No nasal deformity, discharge, epistaxis.   Skin: Warm and dry.  No jaundice.  No suspicious rashes or lesions on the face, nec, chest, arms, abdomen, legs. No redness or bleeding from PEG. Cardiac: RRR, nl S1-S2, no murmurs appreciated.  Capillary refill is brisk.  No LE edema.  Radial pulses 2+ and symmetric. Respiratory: Normal respiratory rate and rhythm.  CTAB without rales or wheezes. Abdomen: Abdomen soft.  No TTP that I can tell, no guarding. No ascites, distension, hepatosplenomegaly.   MSK: No deformities or effusions.  Diffuse mild loss of subcutaneous msucel mass and fat. Neuro: Awake and alert.  EOMI.  Right hemiparesis noted.  Aphasic.  Nods head yes and no.  Veryweak and debilitated. Psych: Sensorium intact and responding to questions, attention normal. Affect flat.  Judgment and insight appear normal.    Data Reviewed: I have personally reviewed following labs and imaging studies:  CBC: Recent Labs  Lab 05/10/20 0603 05/11/20 0643 05/12/20 0311 05/13/20 0204 05/14/20 0453 05/15/20 0246 05/16/20 0430  WBC 12.7* 11.3* 12.0* 9.4 7.4 5.2 4.8  NEUTROABS 9.4* 7.8*  --   --   --   --   --   HGB 11.6* 11.0* 11.4* 11.5* 11.2* 11.1* 12.1*  HCT 34.8* 34.7* 36.1* 37.3* 35.5* 33.8* 37.7*  MCV 92.1 93.5 94.3 95.4 95.7 93.1 94.3  PLT 448* 463* 497* 433* 422* 389 295*   Basic Metabolic Panel: Recent Labs  Lab 05/10/20 0603 05/11/20 0643 05/12/20 0311 05/13/20 0204 05/14/20 0453 05/15/20 0246  NA 136 136 137 136 135 135  K 4.5 4.6 4.8 4.3 4.6 4.2  CL 102 105 103 105 105 103  CO2 25 24 25 23  21* 24  GLUCOSE 119* 133* 112* 103* 105* 125*  BUN 31* 29* 26* 26* 25* 24*  CREATININE 0.80 0.79 0.78 0.77 0.74 0.66  CALCIUM 8.2* 8.2* 8.4* 8.2* 8.3* 8.1*  MG 2.0 2.0  --   --   --   --   PHOS 3.3 3.1  --   --   --   --    GFR: Estimated Creatinine Clearance: 81.2 mL/min (by C-G  formula based on SCr of 0.66 mg/dL). Liver Function Tests: Recent Labs  Lab 05/10/20 0603 05/11/20 0643 05/12/20 0311 05/13/20 0204 05/14/20 0453  AST 47* 44* 46* 41 36  ALT 79* 72* 75* 61* 47*  ALKPHOS 41 44 48 44 40  BILITOT 0.7 1.0 0.9 1.0 0.9  PROT 5.7* 5.8* 6.0* 5.9* 5.9*  ALBUMIN 2.7* 2.7* 2.8* 2.8* 2.8*   No results for input(s): LIPASE, AMYLASE in the last 168 hours. No results for  input(s): AMMONIA in the last 168 hours. Coagulation Profile: Recent Labs  Lab 05/12/20 1134 05/13/20 0204 05/14/20 0453 05/15/20 0246 05/16/20 0430  INR 1.1 1.3* 1.2 1.3* 1.1   Cardiac Enzymes: No results for input(s): CKTOTAL, CKMB, CKMBINDEX, TROPONINI in the last 168 hours. BNP (last 3 results) No results for input(s): PROBNP in the last 8760 hours. HbA1C: No results for input(s): HGBA1C in the last 72 hours. CBG: Recent Labs  Lab 05/15/20 0748 05/15/20 1936 05/15/20 2341 05/16/20 0430 05/16/20 0744  GLUCAP 109* 107* 117* 119* 102*   Lipid Profile: No results for input(s): CHOL, HDL, LDLCALC, TRIG, CHOLHDL, LDLDIRECT in the last 72 hours. Thyroid Function Tests: No results for input(s): TSH, T4TOTAL, FREET4, T3FREE, THYROIDAB in the last 72 hours. Anemia Panel: No results for input(s): VITAMINB12, FOLATE, FERRITIN, TIBC, IRON, RETICCTPCT in the last 72 hours. Urine analysis:    Component Value Date/Time   COLORURINE YELLOW 05/04/2020 1143   APPEARANCEUR CLOUDY (A) 05/04/2020 1143   LABSPEC 1.015 05/04/2020 1143   PHURINE 5.0 05/04/2020 1143   GLUCOSEU NEGATIVE 05/04/2020 1143   HGBUR LARGE (A) 05/04/2020 1143   BILIRUBINUR NEGATIVE 05/04/2020 1143   KETONESUR NEGATIVE 05/04/2020 1143   PROTEINUR 100 (A) 05/04/2020 1143   NITRITE NEGATIVE 05/04/2020 1143   LEUKOCYTESUR LARGE (A) 05/04/2020 1143   Sepsis Labs: @LABRCNTIP (procalcitonin:4,lacticacidven:4)  ) Recent Results (from the past 240 hour(s))  Surgical PCR screen     Status: Abnormal   Collection Time:  05/12/20 12:25 AM   Specimen: Nasal Mucosa; Nasal Swab  Result Value Ref Range Status   MRSA, PCR NEGATIVE NEGATIVE Final   Staphylococcus aureus POSITIVE (A) NEGATIVE Final    Comment: (NOTE) The Xpert SA Assay (FDA approved for NASAL specimens in patients 32 years of age and older), is one component of a comprehensive surveillance program. It is not intended to diagnose infection nor to guide or monitor treatment. Performed at [facility], [place] [address] [place], [place] [address]   SARS CORONAVIRUS 2 (TAT 6-24 HRS) Nasopharyngeal Nasopharyngeal Swab     Status: Abnormal   Collection Time: 05/13/20 12:03 AM   Specimen: Nasopharyngeal Swab  Result Value Ref Range Status   SARS Coronavirus 2 POSITIVE (A) NEGATIVE Final    Comment: (NOTE) SARS-CoV-2 target nucleic acids are DETECTED.  The SARS-CoV-2 RNA is generally detectable in upper and lower respiratory specimens during the acute phase of infection. Positive results are indicative of the presence of SARS-CoV-2 RNA. Clinical correlation with patient history and other diagnostic information is  necessary to determine patient infection status. Positive results do not rule out bacterial infection or co-infection with other viruses.  The expected result is Negative.  Fact Sheet for Patients: SugarRoll.be  Fact Sheet for Healthcare Providers: https://www.woods-mathews.com/  This test is not yet approved or cleared by the [place] FDA and  has been authorized for detection and/or diagnosis of SARS-CoV-2 by FDA under an Emergency Use Authorization (EUA). This EUA will remain  in effect (meaning this test can be used) for the duration of the COVID-19 declaration under Section 564(b)(1) of the Act, 21 U. S.C. section 360bbb-3(b)(1), unless the authorization is terminated or revoked sooner.   Performed at [facility], [place] [address] [place], [place] [address]           Radiology Studies: No results found.      Scheduled Meds: . aspirin  81 mg Per Tube Daily  . carvedilol  12.5 mg Per Tube BID  WC  . Chlorhexidine Gluconate Cloth  6 each Topical Daily  . clopidogrel  75 mg Per Tube Daily  . feeding supplement (PROSource TF)  45 mL Per Tube BID  . finasteride  5 mg Oral Daily  . guaiFENesin-dextromethorphan  5 mL Oral Q6H  . insulin aspart  0-9 Units Subcutaneous Q4H  . mupirocin ointment  1 application Nasal BID  . pravastatin  40 mg Per Tube Daily  . warfarin  5 mg Oral ONCE-1600  . Warfarin - Pharmacist Dosing Inpatient   Does not apply q1600   Continuous Infusions: . feeding supplement (JEVITY 1.5 CAL/FIBER) 1,000 mL (05/16/20 0907)  . heparin 1,100 Units/hr (05/16/20 0459)     LOS: 25 days    Time spent: 35 minutes    Edwin Dada, MD [facility] 05/16/2020, 12:45 PM     Please page though [place] or Epic secure chat:  For [employer], Adult nurse

## 2020-05-16 NOTE — Plan of Care (Signed)
  Problem: Health Behavior/Discharge Planning: Goal: Ability to manage health-related needs will improve Outcome: Not Progressing   Problem: Clinical Measurements: Goal: Will remain free from infection Outcome: Not Progressing   Problem: Activity: Goal: Risk for activity intolerance will decrease Outcome: Not Progressing   Problem: Elimination: Goal: Will not experience complications related to bowel motility Outcome: Not Progressing   Problem: Education: Goal: Individualized Educational Video(s) Outcome: Not Progressing   Problem: Health Behavior/Discharge Planning: Goal: Ability to manage health-related needs will improve Outcome: Not Progressing

## 2020-05-16 NOTE — Progress Notes (Signed)
Pt still bleeding out of IV sites. MD ordered to continue to hold heparin drip. Pharmacy notified. Will continue to monitor

## 2020-05-16 NOTE — TOC Progression Note (Signed)
Transition of Care [facility]) - Progression Note    Patient Details  Name: John Lambert MRN: [id number] Date of Birth: 24-Nov-1940  Transition of Care [facility] CM/SW [place], [place] Phone Number: 05/16/2020, 4:31 PM  Clinical Narrative:   Per chart review, patient now with COVID+ diagnosis. CSW contacted [name] and they will be unable to offer now that the patient is COVID+. CSW reached out to [facility], who had previously declined, and they are also unable to offer and have no COVID bed available. CSW updated MD, who said the patient will be off isolation precautions after 05/18/20 as he is asymptomatic. CSW updated [name], and they will look at the patient again tomorrow to see if they could offer once he is off isolation. CSW spoke with daughter to discuss, who also asked about being able to FaceTime with the patient since she can't visit. CSW discussed with RN, who will try to schedule a FaceTime with the daughter. Daughter also asked if speech could get in touch with her to clarify what the patient is able to understand vs what he isn't, and speech therapy will reevaluate the patient tomorrow to determine current abilities and reach out to the daughter with an update. Daughter appreciative of CSW assistance. CSW to follow.    Expected Discharge Plan: Skilled Nursing Facility Barriers to Discharge: SNF Covid,Continued Medical Work up,Insurance Authorization  Expected Discharge Plan and Services Expected Discharge Plan: [place] In-house Referral: Clinical Social Work Discharge Planning Services: CM Consult Post Acute Care Choice: [place] arrangements for the past 2 months: Single Family Home                                       Social Determinants of Health (SDOH) Interventions    Readmission Risk Interventions No flowsheet data found.

## 2020-05-17 LAB — GLUCOSE, CAPILLARY
Glucose-Capillary: 108 mg/dL — ABNORMAL HIGH (ref 70–99)
Glucose-Capillary: 110 mg/dL — ABNORMAL HIGH (ref 70–99)
Glucose-Capillary: 117 mg/dL — ABNORMAL HIGH (ref 70–99)
Glucose-Capillary: 122 mg/dL — ABNORMAL HIGH (ref 70–99)
Glucose-Capillary: 126 mg/dL — ABNORMAL HIGH (ref 70–99)
Glucose-Capillary: 99 mg/dL (ref 70–99)
Glucose-Capillary: 99 mg/dL (ref 70–99)

## 2020-05-17 LAB — PROTIME-INR
INR: 1.2 (ref 0.8–1.2)
Prothrombin Time: 15.1 seconds (ref 11.4–15.2)

## 2020-05-17 MED ORDER — WARFARIN SODIUM 5 MG PO TABS
5.0000 mg | ORAL_TABLET | Freq: Once | ORAL | Status: AC
  Administered 2020-05-17: 5 mg via ORAL
  Filled 2020-05-17: qty 1

## 2020-05-17 MED ORDER — FREE WATER
200.0000 mL | Freq: Four times a day (QID) | Status: DC
  Administered 2020-05-17 – 2020-05-25 (×32): 200 mL

## 2020-05-17 NOTE — Progress Notes (Signed)
John Lambert for Lovenox / Warfarin Indication: on PTA warfarin for hx of DVT/PE, new acute stroke, new Afib  Patient Measurements: Height: 6\' 2"  (188 cm) Weight: 79.3 kg (174 lb 13.2 oz) IBW/kg (Calculated) : 82.2  Heparin weight: 83.1  Vital Signs: Temp: 97.5 F (36.4 C) (01/11 0400) Temp Source: Oral (01/11 0400) BP: 115/67 (01/11 0400) Pulse Rate: 87 (01/11 0400)  Labs: Recent Labs    05/15/20 0246 05/15/20 1006 05/16/20 0430 05/16/20 0813 05/17/20 0508  HGB 11.1*  --  12.1*  --   --   HCT 33.8*  --  37.7*  --   --   PLT 389  --  407*  --   --   LABPROT 15.4*  --  14.1  --  15.1  INR 1.3*  --  1.1  --  1.2  HEPARINUNFRC 0.67 0.59  --  <0.10*  --   CREATININE 0.66  --   --   --   --     Estimated Creatinine Clearance: 81.2 mL/min (by C-G formula based on SCr of 0.66 mg/dL).  Assessment: 80 yo male with L symptomatic carotid stenosis.  S/p L carotid TCAR procedure w/ stenting that was complicated by postprocedure stroke.  MRI brain shows patchy L basal ganglia and L frontoparietal cortical and subcortical infarcts (new). No hemorrhagic transformation. New stroke and new onset atrial fibrillation. Previous reported hematuria but appears to have resolved.  PTA warfarin dose: 2.5mg  daily except 1.25mg  on Mondays.  Heparin switched to Lovenox 1/10 INR slow to increase  Goal of Therapy:   Appropriate Lovenox dosing INR goal 2-3  Monitor platelets by anticoagulation protocol: Yes    Plan: Lovenox 75 mg sq Q 12 hours Repeat Warfarin 5 mg po X 1 Daily INR  Thank you Anette Guarneri, PharmD [phone] 05/17/2020 8:27 AM

## 2020-05-17 NOTE — Progress Notes (Signed)
IR.  History of CVA with residual dysphagia s/p percutaneous gastrostomy tube placement in IR 05/12/2020.  Received call from [facility] stating patient with pain/tenderness at gastrostomy tube site and requests evaluation. PA at bedside this AM to assess.  Patient laying in bed resting comfortably. He opens eyes to voice and nods head appropriately to yes/no questions. Nods yes when asked if pain at gastrostomy tube site. Asking for ice chips.  Gastrostomy tube site dressing with multiple gauze and tape- this was removed, however appears that bottom half of gauze saturated with dried blood which appeared to be coagulated to the point where it was as hard as plastic and adhered to patient skin and bumper. Once dressing completely removed, gastrostomy tube site soft without erythema, active bleeding, or drainage. Bumper cinched to skin. Site was re-dressed with minimal gauze and paper tape. Site discussed with RN (also informed of request for ice chips).  Gastrostomy tube stable and ready for use. Tenderness probably secondary to combination of 1- hard gauze with coagulated blood adhered to skin and 2- normal post-procedural tenderness secondary to placement of tube through abdominal muscles. Recommend continued pain medications PRN. Recommend dressing removal tomorrow 05/18/2020- ensure area remains clean and dry. Above discussed with Dr. Loleta Books. Please call IR with questions/concerns.   John Graff Lerline Valdivia, PA-C 05/17/2020, 11:20 AM

## 2020-05-17 NOTE — Progress Notes (Signed)
[facility] PROGRESS NOTE    John Lambert  ION:[id number] DOB: [birth year] DOA: 04/19/2020 PCP: Harlan Stains, MD      Brief Narrative:  John Lambert is a 80 y.o. 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  Admitted, and found to have 80-99% L ICA stenosis, vascular surgery were consulted and patient had LEFT TCAR on 12/22, after which he developed aphasia, left gaze deviation and RIGHT hemiparesis and CODE STROKE called in the PACU.  Emergent LEFT cerebral angiogram showed no new findings.  Subsequent hospital stay complicated by hematuria due to UTI, urinary retention, dysphagia requiring PEG placement, and incidental COVID infection.  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 Trula Slade on 12/22 Post TCAR recurrent acute embolic CVA with right hemiparesis and aphasia  -Continue aspirin, Plavix, and warfarin -Continue Plavix for 1 month, transition to aspirin and warfarin alone at Jan 22  -Continue pravastatin   COVID-19 Mild, cough resolved.  Currently asymptomatic.   -D/c precautions on 1/12    Hypertension Pressure normal -Continue carvedilol  New onset paroxysmal atrial fibrillation -Continue warfarin with Lovenox bridge -Continue carvedilol  Gross hematuria, resolved, due to Klebsiella UTI -Continue finasteride  Acute urinary retention Foley placed, urology consulted.  They recommend leaving  foley until outpatient Urology follow up is complete. -Continue foley indwelling -Outpaitent Urology follow up  Recurrent VTE Unclear cause.  On lifelong warfarin.  Family have declined DOAC. -Stop heparin gtt -Start Lovenox bridge -Continue warfarin  Dysphagia S/p PEG placement 1/6 by IR -Continue tube feeds  AKI Resolved           Disposition: Status is: Inpatient  Remains inpatient appropriate because:Unsafe d/c plan   Dispo: The patient is from: Home              Anticipated d/c is to: SNF              Anticipated d/c date is: 1 day              Patient currently is medically stable to d/c.              MDM: The below labs and imaging reports were reviewed and summarized above.  Medication management as above. Blood thinner managed.     DVT prophylaxis: Lovenox  Code Status: FULL Family Communication: Daughter and wife by phone    Consultants:   ID  Urology  Vascular Surgery  Neurology  Palliative Care  Radiology    Antimicrobials:   Ceftriaxone 12/28 >>  1/4                   Keflex 1/4 >> 1/7             Subjective: No new fever, confusion, cough, dyspnea.  Pain around PEG evaluated by IR and was from dried blood.  No change in right hemiparesis.       Objective: Vitals:  05/17/20 0400 05/17/20 0905 05/17/20 1111 05/17/20 1729  BP: 115/67 131/71 111/66 116/72  Pulse: 87 83 80 78  Resp: 17 18 18 18   Temp: (!) 97.5 F (36.4 C) 97.8 F (36.6 C) 97.6 F (36.4 C) 98.1 F (36.7 C)  TempSrc: Oral Axillary Axillary Axillary  SpO2: 96% 96% 98% 97%  Weight:      Height:        Intake/Output Summary (Last 24 hours) at 05/17/2020 1825 Last data filed at 05/17/2020 1500 Gross per 24 hour  Intake 615 ml  Output 1100 ml  Net -485 ml   Filed Weights   05/10/20 0324 05/12/20 0421 05/13/20 0256  Weight: 78.8 kg 80.6 kg 79.3 kg    Examination: General appearance: Thin elderly adult male, weak but awake.   Makes  Eye contact.    HEENT:    Skin:  Cardiac: RRR, no murmurs, no lower extremity edema Respiratory: Normal respiratory rate and rhythm, lungs clear without rales or wheezes Abdomen: Abdomen soft, no tenderness to palpation around his PEG tube, no ascites or distention MSK:  Neuro: Awake and alert, right hemiparesis noted, aphasic, but receptive language appears normal, nods head yes or no.  Extraocular movements intact. Psych: Sensorium intact and responding to questions, attention normal, affect blunted, judgment insight.  Normal          Data Reviewed: I have personally reviewed following labs and imaging studies:  CBC: Recent Labs  Lab 05/11/20 0643 05/12/20 0311 05/13/20 0204 05/14/20 0453 05/15/20 0246 05/16/20 0430  WBC 11.3* 12.0* 9.4 7.4 5.2 4.8  NEUTROABS 7.8*  --   --   --   --   --   HGB 11.0* 11.4* 11.5* 11.2* 11.1* 12.1*  HCT 34.7* 36.1* 37.3* 35.5* 33.8* 37.7*  MCV 93.5 94.3 95.4 95.7 93.1 94.3  PLT 463* 497* 433* 422* 389 725*   Basic Metabolic Panel: Recent Labs  Lab 05/11/20 0643 05/12/20 0311 05/13/20 0204 05/14/20 0453 05/15/20 0246  NA 136 137 136 135 135  K 4.6 4.8 4.3 4.6 4.2  CL 105 103 105 105 103  CO2 24 25 23  21* 24  GLUCOSE 133* 112* 103* 105* 125*  BUN 29* 26* 26* 25* 24*  CREATININE 0.79 0.78 0.77 0.74 0.66  CALCIUM 8.2* 8.4* 8.2* 8.3* 8.1*  MG 2.0  --   --   --   --   PHOS 3.1  --   --   --   --    GFR: Estimated Creatinine Clearance: 81.2 mL/min (by C-G formula based on SCr of 0.66 mg/dL). Liver Function Tests: Recent Labs  Lab 05/11/20 0643 05/12/20 0311 05/13/20 0204 05/14/20 0453  AST 44* 46* 41 36  ALT 72* 75* 61* 47*  ALKPHOS 44 48 44 40  BILITOT 1.0 0.9 1.0 0.9  PROT 5.8* 6.0* 5.9* 5.9*  ALBUMIN 2.7* 2.8* 2.8* 2.8*   No results for input(s): LIPASE, AMYLASE in the last 168 hours. No results for input(s): AMMONIA in the last 168 hours. Coagulation Profile: Recent Labs  Lab 05/13/20 0204 05/14/20 0453  05/15/20 0246 05/16/20 0430 05/17/20 0508  INR 1.3* 1.2 1.3* 1.1 1.2   Cardiac Enzymes: No results for input(s): CKTOTAL, CKMB, CKMBINDEX, TROPONINI in the last 168 hours. BNP (last 3 results) No results for input(s): PROBNP in the last 8760 hours. HbA1C: No results for input(s): HGBA1C in the last 72 hours. CBG: Recent Labs  Lab 05/17/20 0406 05/17/20 0903 05/17/20 1145 05/17/20 1241 05/17/20 1732  GLUCAP 99  122* 99 126* 117*   Lipid Profile: No results for input(s): CHOL, HDL, LDLCALC, TRIG, CHOLHDL, LDLDIRECT in the last 72 hours. Thyroid Function Tests: No results for input(s): TSH, T4TOTAL, FREET4, T3FREE, THYROIDAB in the last 72 hours. Anemia Panel: No results for input(s): VITAMINB12, FOLATE, FERRITIN, TIBC, IRON, RETICCTPCT in the last 72 hours. Urine analysis:    Component Value Date/Time   COLORURINE YELLOW 05/04/2020 1143   APPEARANCEUR CLOUDY (A) 05/04/2020 1143   LABSPEC 1.015 05/04/2020 1143   PHURINE 5.0 05/04/2020 1143   GLUCOSEU NEGATIVE 05/04/2020 1143   HGBUR LARGE (A) 05/04/2020 1143   BILIRUBINUR NEGATIVE 05/04/2020 1143   KETONESUR NEGATIVE 05/04/2020 1143   PROTEINUR 100 (A) 05/04/2020 1143   NITRITE NEGATIVE 05/04/2020 1143   LEUKOCYTESUR LARGE (A) 05/04/2020 1143   Sepsis Labs: @LABRCNTIP (procalcitonin:4,lacticacidven:4)  ) Recent Results (from the past 240 hour(s))  Surgical PCR screen     Status: Abnormal   Collection Time: 05/12/20 12:25 AM   Specimen: Nasal Mucosa; Nasal Swab  Result Value Ref Range Status   MRSA, PCR NEGATIVE NEGATIVE Final   Staphylococcus aureus POSITIVE (A) NEGATIVE Final    Comment: (NOTE) The Xpert SA Assay (FDA approved for NASAL specimens in patients 61 years of age and older), is one component of a comprehensive surveillance program. It is not intended to diagnose infection nor to guide or monitor treatment. Performed at [facility], [place] [address] [place], [place] [address]   SARS  CORONAVIRUS 2 (TAT 6-24 HRS) Nasopharyngeal Nasopharyngeal Swab     Status: Abnormal   Collection Time: 05/13/20 12:03 AM   Specimen: Nasopharyngeal Swab  Result Value Ref Range Status   SARS Coronavirus 2 POSITIVE (A) NEGATIVE Final    Comment: (NOTE) SARS-CoV-2 target nucleic acids are DETECTED.  The SARS-CoV-2 RNA is generally detectable in upper and lower respiratory specimens during the acute phase of infection. Positive results are indicative of the presence of SARS-CoV-2 RNA. Clinical correlation with patient history and other diagnostic information is  necessary to determine patient infection status. Positive results do not rule out bacterial infection or co-infection with other viruses.  The expected result is Negative.  Fact Sheet for Patients: SugarRoll.be  Fact Sheet for Healthcare Providers: https://www.woods-mathews.com/  This test is not yet approved or cleared by the [place] FDA and  has been authorized for detection and/or diagnosis of SARS-CoV-2 by FDA under an Emergency Use Authorization (EUA). This EUA will remain  in effect (meaning this test can be used) for the duration of the COVID-19 declaration under Section 564(b)(1) of the Act, 21 U. S.C. section 360bbb-3(b)(1), unless the authorization is terminated or revoked sooner.   Performed at [facility], [place] [address], [place], [place] [address]          Radiology Studies: No results found.      Scheduled Meds: . aspirin  81 mg Per Tube Daily  . carvedilol  12.5 mg Per Tube BID WC  . Chlorhexidine Gluconate Cloth  6 each Topical Daily  . clopidogrel  75 mg Per Tube Daily  . enoxaparin (LOVENOX) injection  75 mg Subcutaneous Q12H  . feeding supplement (PROSource TF)  45 mL Per Tube BID  . finasteride  5 mg Oral Daily  . free water  200 mL Per Tube Q6H  . guaiFENesin-dextromethorphan  5 mL Oral Q6H  . insulin aspart  0-9 Units Subcutaneous  Q4H  . pravastatin  40 mg Per Tube Daily  . Warfarin -  Pharmacist Dosing Inpatient   Does not apply q1600   Continuous Infusions: . feeding supplement (JEVITY 1.5 CAL/FIBER) 55 mL/hr at 05/16/20 1728     LOS: 26 days    Time spent: 35 minutes    Edwin Dada, MD [facility] 05/17/2020, 6:25 PM     Please page though [place] or Epic secure chat:  For [employer], Adult nurse

## 2020-05-17 NOTE — Progress Notes (Signed)
Occupational Therapy Treatment Patient Details Name: John Lambert MRN: [id number] DOB: 1940-12-20 Today's Date: 05/17/2020    History of present illness John Lambert is a 80 y.o. male admitted 12/14 with bil LE weakness and difficulty walking as well as left hip pain. Imaging notable for Numerous scattered punctate acute infarctions in the left  posterior frontal region consistent with micro embolic infarctions. Punctate acute infarction also affecting the splenium of  the corpus callosum.  Pt with L carotid artery stenosis s/p stent on 04/27/20. Post procedure pt with right hemiparesis, aphasia and left gaze with intraprocedural stroke. Code stroke called with CT head (-) for acute findings. Emergent L carotid diagnostic cerebral cath angiogram (-) for acute findings. MRI 12/23 (+) patchy left basal ganglia as well as left frontoparietal cortical and subcortical infarcts. Tested COVID + on 05/13/2020. 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   OT comments  Pt progressing slowly towards established OT goals and presenting with increased engagement this session. Pt continues to nod head no to majority of questions and requests. Pt requiring Max A +2 for bed mobility and sitting at EOB with Min-Max A for support pending fatigue and pushing to R; benefits from cues to place LUE in his lap while seated. Pt participating in grooming, self feeding, and IS exercises. Bowel incontinence and requiring Total A at bed level for toilet hygiene; notified RN of wound at R buttock. Continue to recommend dc to SNF and will continue to follow acutely as admitted.   SpO2 98% on RA   Follow Up Recommendations  SNF    Equipment Recommendations  Other (comment) (Defer to next venue)    Recommendations for Other Services Rehab consult    Precautions / Restrictions Precautions Precautions: Fall Precaution Comments: cortrak, R hemi, COVID+        Mobility Bed Mobility Overal bed mobility: Needs Assistance Bed Mobility: Rolling;Supine to Sit;Sit to Supine Rolling: Max assist   Supine to sit: +2 for physical assistance;Max assist Sit to supine: +2 for safety/equipment;Max assist   General bed mobility comments: pt rolled to each side with max +2 for bowel clean up after session. Able to hold self in SL with close guarding. Max A +2 to come to sitting, able to push with LUE. Max A +2 to return to supine with pt initiating lean twds bed  Transfers                 General transfer comment: unable    Balance Overall balance assessment: Needs assistance Sitting-balance support: Bilateral upper extremity supported;Feet supported Sitting balance-Leahy Scale: Poor Sitting balance - Comments: pt required fluctuating assist from max to min. Initially pushing with LUE but placed hand in lap at times when cued. When engaging core with breathing exercises and eating was min A. Right, posterior lean. Less pushing with LUE this week than last Postural control: Posterior lean;Right lateral lean     Standing balance comment: fatigued too much in sitting to tolerate standing attempt                           ADL either performed or assessed with clinical judgement   ADL Overall ADL's : Needs assistance/impaired Eating/Feeding: Minimal assistance;Sitting Eating/Feeding Details (indicate cue type and reason): Pt nodding yes to wanting chocolate pudding. Assisting to hold pudding cup in R hand and pt scooping and bringing spoon to mouth with  left. Taking three bites of pudding before becoming fatigued. Pt attempting a large bite and able to follow cues to scoop smiler bite; correcting himself and rescooping with spoon Grooming: Maximal assistance;Set up;Wash/dry face;Sitting;Cueing for sequencing Grooming Details (indicate cue type and reason): Max A for sitting at EOB. Able to take wash clothe in LUE and wash his face with cues  for washing each area of face                       Toileting - Clothing Manipulation Details (indicate cue type and reason): Total A  at bed level for peri care after bowel incontinence       General ADL Comments: Pt participating in sitting at EOB, grooming, self feeding, and performing IS exercises     Vision   Vision Assessment?: Vision impaired- to be further tested in functional context   Perception     Praxis      Cognition Arousal/Alertness: Lethargic;Awake/alert (moments of wakefulness and moments of lethargy) Behavior During Therapy: Flat affect Overall Cognitive Status: Difficult to assess Area of Impairment: Attention;Following commands;Problem solving;Safety/judgement;Awareness;Memory                   Current Attention Level: Sustained Memory: Decreased short-term memory Following Commands: Follows one step commands with increased time;Follows one step commands inconsistently Safety/Judgement: Decreased awareness of deficits Awareness: Emergent;Intellectual Problem Solving: Slow processing;Decreased initiation;Requires verbal cues;Requires tactile cues General Comments: pt nonverbal during session, responding with no head shake very frequently but gave affirmative head nod to wanting chocolate pudding. More engaged in session this week than last session. Benefiting from calm, direct cues.        Exercises Exercises: Other exercises Other Exercises Other Exercises: IS with Max cues; pt both blowing and attempting to suck. Very fatigue   Shoulder Instructions       General Comments VSS, SPO2 remained >90% on RA. Pt incontinent of bowel during session. Cleaned and Rn applied dressing to wound on buttocks    Pertinent Vitals/ Pain       Pain Assessment: Faces Faces Pain Scale: Hurts little more Pain Location: generalized discomfort and buttocks with bowel clean up (open wound that was shown to RN and dressing applied) Pain Descriptors /  Indicators: Discomfort;Grimacing;Guarding Pain Intervention(s): Monitored during session;Limited activity within patient's tolerance;Repositioned  Home Living                                          Prior Functioning/Environment              Frequency  Min 2X/week        Progress Toward Goals  OT Goals(current goals can now be found in the care plan section)  Progress towards OT goals: Progressing toward goals  Acute Rehab OT Goals Patient Stated Goal: none stated, pt did not attempt to verbalize OT Goal Formulation: With patient Time For Goal Achievement: 05/27/20 Potential to Achieve Goals: Fair ADL Goals Pt Will Perform Grooming: with min assist;bed level Pt Will Transfer to Toilet: with modified independence;ambulating Additional ADL Goal #1: Patient will tolerate static unsupported sitting at EOB for >5 minute with Min A. Additional ADL Goal #2: Patient will demonstrate functional transfers with use of LRAD and Mod A.  Plan Discharge plan remains appropriate    Co-evaluation    PT/OT/SLP Co-Evaluation/Treatment: Yes Reason for Co-Treatment: Complexity of the  patient's impairments (multi-system involvement);Necessary to address cognition/behavior during functional activity;To address functional/ADL transfers;For patient/therapist safety PT goals addressed during session: Mobility/safety with mobility;Balance OT goals addressed during session: ADL's and self-care      AM-PAC OT "6 Clicks" Daily Activity     Outcome Measure   Help from another person eating meals?: A Little Help from another person taking care of personal grooming?: A Lot Help from another person toileting, which includes using toliet, bedpan, or urinal?: Total Help from another person bathing (including washing, rinsing, drying)?: Total Help from another person to put on and taking off regular upper body clothing?: Total Help from another person to put on and taking off  regular lower body clothing?: Total 6 Click Score: 9    End of Session    OT Visit Diagnosis: Unsteadiness on feet (R26.81);Other abnormalities of gait and mobility (R26.89);Muscle weakness (generalized) (M62.81);Pain Pain - Right/Left: Right Pain - part of body: Shoulder;Arm;Hand   Activity Tolerance Patient tolerated treatment well   Patient Left in bed;with call bell/phone within reach;with bed alarm set   Nurse Communication Mobility status;Other (comment) (Skin tear at bottom)        Time: 5859-2924 OT Time Calculation (min): 49 min  Charges: OT General Charges $OT Visit: 1 Visit OT Treatments $Self Care/Home Management : 23-37 mins  D'Hanis, OTR/L Acute Rehab Pager: [phone] Office: [place] 05/17/2020, 5:30 PM

## 2020-05-17 NOTE — Progress Notes (Signed)
Speech Language Pathology Treatment: Dysphagia;Cognitive-Linquistic  Patient Details Name: John Lambert MRN: [id number] DOB: 1941-04-02 Today's Date: 05/17/2020 Time: 1211-1232 SLP Time Calculation (min) (ACUTE ONLY): 21 min  Assessment / Plan / Recommendation Clinical Impression  Pt was alert and able to participate in therapy today. There was no family present for today's session.   SWALLOWING Pt agreeable to trials of ice chips.  Pt achieved 10 swallows across 4 ice chip trials.  There were no over s/s of aspiration with any ice chips.  Introduced swallow exercise program.  Pt was able to complete CTAR x3 with good effort and fair accuracy with pt fatiguing across trials.  Pt attempted [place], but was unable to complete.  Pt did not attempt glottal hold exercise and endorsed that he was tired and would like therapist to end session.  COMMUNICATION Today pt identified items from a field of 2 with 100% accuracy.  Pt followed 1 step directions with 100% accuracy.  Pt followed 2 step directions with 25% accuracy, typically completing only the first item of the command.  Pt answered personal yes/no questions with 100% accuracy.  Because of pt's positioning, head nod/shake was not always clear.  Pt was able to answer with a thumbs up/down with much greater clarity.  Pt was approximately 50% accurate with comparison and sequencing yes/no questions.  Pt was able to vocalize x1 on command.  Pt was unable to participate in automatic speech task with maximum verbal, visual, and tactile cuing.  Pt also did not benefit from attempts at singing to facilitation phonation or articulatory gestures.    HPI HPI: Pt is an 80 y.o. 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 He also has pain in left upper leg/hip region, and R foot drag; Imaging  notable for Numerous scattered punctate acute infarctions in the left  posterior frontal region consistent with micro embolic infarctions  which could either be in the anterior or middle cerebral artery  territory. Punctate acute infarction also affecting the splenium of  the corpus callosum. Pt was seen by SLP on 12/17 for a BSE and SLE with no subsequent need for SLP services. Left carotid TCAR procedure with stent placement conducted on 12/22. Following the procedure he was found to be aphasic unable to speak with left gaze deviation and right hemiparesis. Emergent CTA showed patent left carotid stent with good flow in the terminal left ICA as well as middle cerebral artery in the M1 segment. There appeared to be decreased flow in one of the M2 branches by neurology. SLP was consulted due to pt failing the [place], but a failed swallow screen has not been documented in EMR. EEG 12/22: No seizures. MRI 12/23: New areas of acute infarction are present scattered within the  left basal ganglia and radiating white matter tracts and within the  left frontoparietal cortical and subcortical brain.EEG 12/30: cortical dysfunction in left hemisphere, maximal left fronto-temporal region likely secondary to underlying stroke/structural abnormality. No seizures      SLP Plan  Continue with current plan of care       Recommendations  Diet recommendations: NPO Medication Administration: Via alternative means                Oral Care Recommendations: Oral care QID Follow up Recommendations: Inpatient Rehab SLP Visit Diagnosis: Dysphagia, oropharyngeal phase (R13.12);Aphasia (R47.01)  Plan: Continue with current plan of care       [place], [place], [place] Office: [phone]  05/17/2020, 12:50 PM

## 2020-05-17 NOTE — Plan of Care (Signed)
Pt worked with PT today. Free water flushes increased to 200cc q6H. No major changes in pt's baseline and goals of care. 1 BM UO 400cc. CHG bath given.   Problem: Clinical Measurements: Goal: Will remain free from infection Outcome: Progressing   Problem: Activity: Goal: Risk for activity intolerance will decrease Outcome: Progressing   Problem: Nutrition: Goal: Adequate nutrition will be maintained Outcome: Progressing   Problem: Elimination: Goal: Will not experience complications related to bowel motility Outcome: Progressing

## 2020-05-17 NOTE — Progress Notes (Signed)
Physical Therapy Treatment Patient Details Name: John Lambert MRN: [id number] DOB: Jun 09, 1940 Today's Date: 05/17/2020    History of Present Illness SEVILLE DOWNS is a 80 y.o. male admitted 12/14 with bil LE weakness and difficulty walking as well as left hip pain. Imaging notable for Numerous scattered punctate acute infarctions in the left  posterior frontal region consistent with micro embolic infarctions. Punctate acute infarction also affecting the splenium of  the corpus callosum.  Pt with L carotid artery stenosis s/p stent on 04/27/20. Post procedure pt with right hemiparesis, aphasia and left gaze with intraprocedural stroke. Code stroke called with CT head (-) for acute findings. Emergent L carotid diagnostic cerebral cath angiogram (-) for acute findings. MRI 12/23 (+) patchy left basal ganglia as well as left frontoparietal cortical and subcortical infarcts. 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    PT Comments    Pt nods "no" to working with therapy but less resistant than last week. Co treated with OT to maximize function. Pt came to EOB with +2 max A, assisting with use of LUE. Pt initially needed max A to maintain sitting due to Right posterior lean and L hand push. However, pt placing L hand into lap at times when cued and had periods of abdominal engagement when he was able to sit with min A. Worked on core activation and pt stimulation. Pt did express desire to have 3 bites of chocolate pudding when offered. SPO2 remained in 90's on RA throughout session. PT will continue to follow.    Follow Up Recommendations  SNF;Supervision/Assistance - 24 hour     Equipment Recommendations  Wheelchair (measurements PT);Wheelchair cushion (measurements PT);Hospital bed    Recommendations for Other Services Rehab consult     Precautions / Restrictions Precautions Precautions: Fall Precaution Comments: cortrak, R hemi     Mobility  Bed Mobility Overal bed mobility: Needs Assistance Bed Mobility: Rolling Rolling: Max assist   Supine to sit: +2 for physical assistance;Max assist Sit to supine: +2 for safety/equipment;Max assist   General bed mobility comments: pt rolled to each side with max +2 for bowel clean up after session. Able to hold self in SL with close guarding. Max A +2 to come to sitting, able to push with LUE. Max A +2 to return to supine with pt initiating lean twds bed  Transfers                 General transfer comment: unable  Ambulation/Gait             General Gait Details: unable   Stairs             Wheelchair Mobility    Modified Rankin (Stroke Patients Only) Modified Rankin (Stroke Patients Only) Pre-Morbid Rankin Score: No significant disability Modified Rankin: Severe disability     Balance Overall balance assessment: Needs assistance Sitting-balance support: Bilateral upper extremity supported;Feet supported Sitting balance-Leahy Scale: Poor Sitting balance - Comments: pt required fluctuating assist from max to min. Initially pushing with LUE but placed hand in lap at times when cued. When engaging core with breathing exercises and eating was min A. Right, posterior lean. Less pushing with LUE this week than last Postural control: Posterior lean;Right lateral lean     Standing balance comment: fatigued too much in sitting to tolerate standing attempt  Cognition Arousal/Alertness: Lethargic;Awake/alert (moments of wakefulness and moments of lethargy) Behavior During Therapy: Flat affect Overall Cognitive Status: Difficult to assess Area of Impairment: Attention;Following commands;Problem solving;Safety/judgement;Awareness;Memory                   Current Attention Level: Selective Memory: Decreased short-term memory Following Commands: Follows one step commands with increased time;Follows one  step commands inconsistently Safety/Judgement: Decreased awareness of deficits Awareness: Emergent Problem Solving: Slow processing;Decreased initiation;Requires verbal cues;Requires tactile cues General Comments: pt nonverbal during session, responding with no head shake very frequently but gave affirmative head nod to wanting chocolate pudding. More engaged in session this week than last session      Exercises      General Comments General comments (skin integrity, edema, etc.): VSS, SPO2 remained >90% on RA. Pt incontinent of bowel during session. Cleaned and Rn applied dressing to wound on buttocks      Pertinent Vitals/Pain Pain Assessment: Faces Faces Pain Scale: Hurts little more Pain Location: generalized discomfort and buttocks with bowel clean up (open wound that was shown to RN and dressing applied) Pain Descriptors / Indicators: Discomfort;Grimacing;Guarding Pain Intervention(s): Limited activity within patient's tolerance;Monitored during session    Home Living                      Prior Function            PT Goals (current goals can now be found in the care plan section) Acute Rehab PT Goals Patient Stated Goal: none stated, pt did not attempt to verbalize PT Goal Formulation: With patient/family Time For Goal Achievement: 05/27/20 Potential to Achieve Goals: Fair Progress towards PT goals: Progressing toward goals    Frequency    Min 2X/week      PT Plan Current plan remains appropriate    Co-evaluation PT/OT/SLP Co-Evaluation/Treatment: Yes Reason for Co-Treatment: Complexity of the patient's impairments (multi-system involvement);Necessary to address cognition/behavior during functional activity;For patient/therapist safety PT goals addressed during session: Mobility/safety with mobility;Balance        AM-PAC PT "6 Clicks" Mobility   Outcome Measure  Help needed turning from your back to your side while in a flat bed without using  bedrails?: Total Help needed moving from lying on your back to sitting on the side of a flat bed without using bedrails?: Total Help needed moving to and from a bed to a chair (including a wheelchair)?: Total Help needed standing up from a chair using your arms (e.g., wheelchair or bedside chair)?: Total Help needed to walk in hospital room?: Total Help needed climbing 3-5 steps with a railing? : Total 6 Click Score: 6    End of Session   Activity Tolerance: Patient limited by fatigue;Patient limited by lethargy Patient left: in bed;with call bell/phone within reach;with bed alarm set Nurse Communication: Mobility status PT Visit Diagnosis: Muscle weakness (generalized) (M62.81);History of falling (Z91.81);Unsteadiness on feet (R26.81);Other abnormalities of gait and mobility (R26.89);Difficulty in walking, not elsewhere classified (R26.2);Other symptoms and signs involving the nervous system (R29.898);Hemiplegia and hemiparesis Hemiplegia - Right/Left: Right Hemiplegia - caused by: Cerebral infarction     Time: 0350-0938 PT Time Calculation (min) (ACUTE ONLY): 46 min  Charges:  $Therapeutic Activity: 8-22 mins                     [place]  Pager [phone] Office [place] 05/17/2020, 4:39 PM

## 2020-05-18 LAB — CBC
HCT: 43.3 % (ref 39.0–52.0)
Hemoglobin: 13.5 g/dL (ref 13.0–17.0)
MCH: 29.6 pg (ref 26.0–34.0)
MCHC: 31.2 g/dL (ref 30.0–36.0)
MCV: 95 fL (ref 80.0–100.0)
Platelets: 269 10*3/uL (ref 150–400)
RBC: 4.56 MIL/uL (ref 4.22–5.81)
RDW: 14.3 % (ref 11.5–15.5)
WBC: 6.1 10*3/uL (ref 4.0–10.5)
nRBC: 0 % (ref 0.0–0.2)

## 2020-05-18 LAB — GLUCOSE, CAPILLARY
Glucose-Capillary: 120 mg/dL — ABNORMAL HIGH (ref 70–99)
Glucose-Capillary: 131 mg/dL — ABNORMAL HIGH (ref 70–99)
Glucose-Capillary: 140 mg/dL — ABNORMAL HIGH (ref 70–99)
Glucose-Capillary: 146 mg/dL — ABNORMAL HIGH (ref 70–99)
Glucose-Capillary: 132 mg/dL — ABNORMAL HIGH (ref 70–99)
Glucose-Capillary: 136 mg/dL — ABNORMAL HIGH (ref 70–99)

## 2020-05-18 LAB — PROTIME-INR
INR: 1.3 — ABNORMAL HIGH (ref 0.8–1.2)
Prothrombin Time: 15.5 seconds — ABNORMAL HIGH (ref 11.4–15.2)

## 2020-05-18 LAB — BASIC METABOLIC PANEL
Anion gap: 11 (ref 5–15)
BUN: 24 mg/dL — ABNORMAL HIGH (ref 8–23)
CO2: 23 mmol/L (ref 22–32)
Calcium: 9 mg/dL (ref 8.9–10.3)
Chloride: 101 mmol/L (ref 98–111)
Creatinine, Ser: 0.65 mg/dL (ref 0.61–1.24)
GFR, Estimated: >60 mL/min (ref >60)
Glucose, Bld: 131 mg/dL — ABNORMAL HIGH (ref 70–99)
Potassium: 4.7 mmol/L (ref 3.5–5.1)
Sodium: 135 mmol/L (ref 135–145)

## 2020-05-18 LAB — D-DIMER, QUANTITATIVE: D-Dimer, Quant: 3.09 ug/mL-FEU — ABNORMAL HIGH (ref 0.00–0.50)

## 2020-05-18 LAB — C-REACTIVE PROTEIN: CRP: 0.9 mg/dL (ref <1.0)

## 2020-05-18 MED ORDER — WARFARIN SODIUM 7.5 MG PO TABS
7.5000 mg | ORAL_TABLET | Freq: Once | ORAL | Status: AC
  Administered 2020-05-18: 7.5 mg via ORAL
  Filled 2020-05-18: qty 1

## 2020-05-18 NOTE — Progress Notes (Signed)
[facility] PROGRESS NOTE    John Lambert  [id number] DOB: 1940/09/28 DOA: 04/19/2020 PCP: Harlan Stains, MD      Brief Narrative:  John Lambert is a 80 y.o. 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  Admitted, and found to have 80-99% L ICA stenosis, vascular surgery were consulted and patient had LEFT TCAR on 12/22, after which he developed aphasia, left gaze deviation and RIGHT hemiparesis and CODE STROKE called in the PACU.  Emergent LEFT cerebral angiogram showed no new findings.  Subsequent hospital stay complicated by hematuria due to UTI, urinary retention, dysphagia requiring PEG placement, and incidental COVID infection.  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 Trula Slade on 12/22 Post TCAR recurrent acute embolic CVA with right hemiparesis and aphasia  - Continue aspirin, Plavix, and warfarin - Continue Plavix for 1 month, transition to aspirin and warfarin alone at Jan 22  - Continue pravastatin     COVID-19 Mild, symptoms resolved.  Currently asymptomatic.   -D/c precautions on 1/17    Hypertension Pressure normal - Continue carvedilol  New onset paroxysmal atrial fibrillation - Continue warfarin with Lovenox bridge - Continue carvedilol  Gross hematuria, resolved, due to Klebsiella UTI - Continue finasteride  Acute retention of urine Foley placed, urology consulted.  They  recommend leaving foley until outpatient Urology follow up is complete. - Continue foley indwelling - Outpatient Urology follow up  Recurrent VTE Unclear cause.  On lifelong warfarin.  Family have declined DOAC. - Continue Lovenox bridge - Continue warfarin  Dysphagia S/p PEG placement 1/6 by IR - Continue tube feeds  AKI Resolved           Disposition: Status is: Inpatient  Remains inpatient appropriate because:Unsafe d/c plan   Dispo: The patient is from: Home              Anticipated d/c is to: SNF              Anticipated d/c date is: 1 day              Patient currently is medically stable to d/c.              MDM: The below labs and imaging reports were reviewed and summarized above.  Medication management as above. Blood thinner managed.     DVT prophylaxis: Lovenox  Code Status: FULL Family Communication: Daughter and wife by phone    Consultants:   ID  Urology  Vascular Surgery  Neurology  Palliative Care  Radiology    Antimicrobials:   Ceftriaxone 12/28 >>  1/4                   Keflex 1/4 >> 1/7             Subjective: Patient feeling well.  No new complaints.  No chest pain, dyspnea, vomiting.  His cough is improved, the pain around his  PEG is good, no abdominal pain.  No change in his hemiparesis.  He is generally weak.      Objective: Vitals:   05/18/20 0500 05/18/20 0512 05/18/20 0844 05/18/20 1122  BP:  107/76 103/79 103/69  Pulse:  62 82 94  Resp:  20 20 20   Temp:  98.1 F (36.7 C) 97.7 F (36.5 C) 97.9 F (36.6 C)  TempSrc:  Axillary Oral Oral  SpO2:  96% 97% 97%  Weight: 76.5 kg     Height:        Intake/Output Summary (Last 24 hours) at 05/18/2020 1652 Last data filed at 05/18/2020 6195 Gross per 24 hour  Intake -  Output 200 ml  Net -200 ml   Filed Weights   05/12/20 0421 05/13/20 0256 05/18/20 0500  Weight: 80.6 kg 79.3 kg 76.5 kg    Examination: General appearance:  Elderly thin adult male, watching television, sleepy.     HEENT: Dentition normal repair, oropharynx tacky dry, no oral lesions, hearing normal. Skin:  Cardiac: RRR, no murmurs, no lower extremity edema Respiratory: Normal respiratory rate and rhythm, lungs clear without rales or wheezes Abdomen: Abdomen soft no tenderness palpation or guarding.  PEG tube in place, no ascites or distention.  No hemorrhage around the PEG tube. MSK: Diffuse loss of subcutaneous muscle mass and fat. Neuro: Awake and alert, watching television, change the channel on his own back to his preferred channel.  Right hemiparesis appears dense.  He is also quite aphasic, but receptive language is normal, answers questions appropriately, extraocular movements intact.  Head preference to the left. Psych: Sensorium intact and responding to questions with head nods, attention seems normal, affect blunted, judgment and insight appear normal          Data Reviewed: I have personally reviewed following labs and imaging studies:  CBC: Recent Labs  Lab 05/13/20 0204 05/14/20 0453 05/15/20 0246 05/16/20 0430 05/18/20 0422  WBC 9.4 7.4 5.2 4.8 6.1  HGB 11.5* 11.2* 11.1* 12.1* 13.5  HCT 37.3* 35.5* 33.8* 37.7* 43.3  MCV 95.4 95.7 93.1 94.3 95.0  PLT 433* 422* 389 407* 093   Basic Metabolic Panel: Recent Labs  Lab 05/12/20 0311 05/13/20 0204 05/14/20 0453 05/15/20 0246 05/18/20 0422  NA 137 136 135 135 135  K 4.8 4.3 4.6 4.2 4.7  CL 103 105 105 103 101  CO2 25 23 21* 24 23  GLUCOSE 112* 103* 105* 125* 131*  BUN 26* 26* 25* 24* 24*  CREATININE 0.78 0.77 0.74 0.66 0.65  CALCIUM 8.4* 8.2* 8.3* 8.1* 9.0   GFR: Estimated Creatinine Clearance: 78.4 mL/min (by C-G formula based on SCr of 0.65 mg/dL). Liver Function Tests: Recent Labs  Lab 05/12/20 0311 05/13/20 0204 05/14/20 0453  AST 46* 41 36  ALT 75* 61* 47*  ALKPHOS 48 44 40  BILITOT 0.9 1.0 0.9  PROT 6.0* 5.9* 5.9*  ALBUMIN 2.8* 2.8* 2.8*   No  results for input(s): LIPASE, AMYLASE in the last 168 hours. No results for input(s): AMMONIA in the last 168 hours. Coagulation Profile: Recent Labs  Lab 05/14/20 0453 05/15/20 0246 05/16/20 0430 05/17/20 0508 05/18/20 0422  INR 1.2 1.3* 1.1 1.2 1.3*   Cardiac Enzymes: No results for input(s): CKTOTAL, CKMB, CKMBINDEX, TROPONINI in the last 168 hours. BNP (last 3 results) No results for input(s): PROBNP in the last 8760 hours. HbA1C: No results for input(s): HGBA1C in the last 72 hours. CBG: Recent Labs  Lab 05/17/20 2023 05/17/20 2354 05/18/20 0342  05/18/20 0847 05/18/20 1126  GLUCAP 110* 108* 131* 140* 146*   Lipid Profile: No results for input(s): CHOL, HDL, LDLCALC, TRIG, CHOLHDL, LDLDIRECT in the last 72 hours. Thyroid Function Tests: No results for input(s): TSH, T4TOTAL, FREET4, T3FREE, THYROIDAB in the last 72 hours. Anemia Panel: No results for input(s): VITAMINB12, FOLATE, FERRITIN, TIBC, IRON, RETICCTPCT in the last 72 hours. Urine analysis:    Component Value Date/Time   COLORURINE YELLOW 05/04/2020 1143   APPEARANCEUR CLOUDY (A) 05/04/2020 1143   LABSPEC 1.015 05/04/2020 1143   PHURINE 5.0 05/04/2020 1143   GLUCOSEU NEGATIVE 05/04/2020 1143   HGBUR LARGE (A) 05/04/2020 1143   BILIRUBINUR NEGATIVE 05/04/2020 1143   KETONESUR NEGATIVE 05/04/2020 1143   PROTEINUR 100 (A) 05/04/2020 1143   NITRITE NEGATIVE 05/04/2020 1143   LEUKOCYTESUR LARGE (A) 05/04/2020 1143   Sepsis Labs: @LABRCNTIP (procalcitonin:4,lacticacidven:4)  ) Recent Results (from the past 240 hour(s))  Surgical PCR screen     Status: Abnormal   Collection Time: 05/12/20 12:25 AM   Specimen: Nasal Mucosa; Nasal Swab  Result Value Ref Range Status   MRSA, PCR NEGATIVE NEGATIVE Final   Staphylococcus aureus POSITIVE (A) NEGATIVE Final    Comment: (NOTE) The Xpert SA Assay (FDA approved for NASAL specimens in patients 95 years of age and older), is one component of a  comprehensive surveillance program. It is not intended to diagnose infection nor to guide or monitor treatment. Performed at [facility], [place] [address], [place], [place] [address]   SARS CORONAVIRUS 2 (TAT 6-24 HRS) Nasopharyngeal Nasopharyngeal Swab     Status: Abnormal   Collection Time: 05/13/20 12:03 AM   Specimen: Nasopharyngeal Swab  Result Value Ref Range Status   SARS Coronavirus 2 POSITIVE (A) NEGATIVE Final    Comment: (NOTE) SARS-CoV-2 target nucleic acids are DETECTED.  The SARS-CoV-2 RNA is generally detectable in upper and lower respiratory specimens during the acute phase of infection. Positive results are indicative of the presence of SARS-CoV-2 RNA. Clinical correlation with patient history and other diagnostic information is  necessary to determine patient infection status. Positive results do not rule out bacterial infection or co-infection with other viruses.  The expected result is Negative.  Fact Sheet for Patients: SugarRoll.be  Fact Sheet for Healthcare Providers: https://www.woods-mathews.com/  This test is not yet approved or cleared by the [place] FDA and  has been authorized for detection and/or diagnosis of SARS-CoV-2 by FDA under an Emergency Use Authorization (EUA). This EUA will remain  in effect (meaning this test can be used) for the duration of the COVID-19 declaration under Section 564(b)(1) of the Act, 21 U. S.C. section 360bbb-3(b)(1), unless the authorization is terminated or revoked sooner.   Performed at [facility], [place] [address], [place], [place] [address]          Radiology Studies: No results found.      Scheduled Meds: . aspirin  81 mg Per Tube Daily  . carvedilol  12.5 mg Per Tube BID WC  . Chlorhexidine Gluconate Cloth  6 each Topical Daily  . clopidogrel  75 mg Per Tube Daily  . enoxaparin (LOVENOX) injection  75 mg Subcutaneous Q12H  . feeding  supplement (PROSource TF)  45 mL Per Tube BID  . finasteride  5 mg Oral Daily  . free water  200 mL Per Tube Q6H  . guaiFENesin-dextromethorphan  5 mL Oral Q6H  . insulin aspart  0-9 Units Subcutaneous Q4H  . pravastatin  40 mg  Per Tube Daily  . warfarin  7.5 mg Oral ONCE-1600  . Warfarin - Pharmacist Dosing Inpatient   Does not apply q1600   Continuous Infusions: . feeding supplement (JEVITY 1.5 CAL/FIBER) 55 mL/hr at 05/16/20 1728     LOS: 27 days    Time spent: 25 minutes    Edwin Dada, MD [facility] 05/18/2020, 4:52 PM     Please page though [place] or Epic secure chat:  For [employer], Adult nurse

## 2020-05-18 NOTE — Progress Notes (Signed)
Performed ROM exercises all 4 extremities. All extremities dependent with total movement from RN except left arm. Called daughter and updated her about her dad today. Placed room phone beside patients ear twice so he could hear his daughters voice. Also informed daughter about visiting the patient according to "Cares Act".

## 2020-05-18 NOTE — TOC Progression Note (Signed)
Transition of Care [facility]) - Progression Note    Patient Details  Name: John Lambert MRN: [id number] Date of Birth: 07-10-1940  Transition of [facility]) CM/SW [place], [place] Phone Number: 05/18/2020, 11:21 AM  Clinical Narrative:   CSW spoke with Admissions at [facility], who are reviewing the referral again on if they can take the patient once he no longer needs contact precautions for COVID. Admissions has concerns because the patient looks like he might end up needing long term care, asked to speak with daughter. CSW spoke with daughter, John Lambert, to ask if Admissions could call her to discuss concerns regarding admission to SNF. Hope agreeable for SNF to call. CSW also discussed with [facility] that speech would be seeing the patient and nursing to schedule a video call. Per chart review in the afternoon, noting that speech had evaluated the patient, CSW sent speech a message to ask if they had relayed the information during eval to the daughter because she had questions. Awaiting response. [name] indicated that they were still reviewing the patient's case and would have to update CSW tomorrow. CSW to follow.    Expected Discharge Plan: Skilled Nursing Facility Barriers to Discharge: SNF Covid,Continued Medical Work up,Insurance Authorization  Expected Discharge Plan and Services Expected Discharge Plan: [place] In-house Referral: Clinical Social Work Discharge Planning Services: CM Consult Post Acute Care Choice: [place] arrangements for the past 2 months: Single Family Home                                       Social Determinants of Health (SDOH) Interventions    Readmission Risk Interventions No flowsheet data found.

## 2020-05-18 NOTE — Progress Notes (Signed)
Nutrition Follow-up  DOCUMENTATION CODES:   Not applicable  INTERVENTION:   Recommend initiation of bowel regimen as no BM documented since 05/14/20.  Continue via PEG: -Jevity 1.5 @ 33m/hr (13227md) -4565mrosource TF BID -200m33mee water Q6H   Provides 2060 kcal, 106 gm protein, 1003 ml free water daily (1803ml25mal free water with flushes)  NUTRITION DIAGNOSIS:   Inadequate oral intake related to dysphagia as evidenced by NPO status. -- ongoing  GOAL:   Patient will meet greater than or equal to 90% of their needs -- Met with TF  MONITOR:   TF tolerance,Labs,Diet advancement  REASON FOR ASSESSMENT:   Rounds   ASSESSMENT:   Pt admitted with weakness and L ICA stenosis s/p L TCAR complicated by post-op acute CVA resulting in aphasia and R hemiplegia. 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   12/22 s/p L TCAR; pt found to have new stroke 12/24 Cortrak placed (gastric) 12/27 Cortrak replaced (gastric) 1/6 s/p PEG placement 1/7 TF resumed  Pt was incidentally found to have COVID-19 on 1/7. Pt is largely asymptomatic other than a cough.   Pt continues to receive TF via PEG and is tolerating well per RN. Current TF regimen: Jevity 1.5 @ 55ml/13m45ml P17murce TF BID, and 200ml fr3mater Q6H.  Recommend initiation of bowel regimen as no BM documented since 05/14/20.  Admit wt: 79.7 kg Current wt: 76.5 kg  UOP: 450ml doc8mted today  Labs: CBGs 100-108 Medications: ss novolog Q4H, warfarin  Diet Order:   Diet Order            Diet NPO time specified Except for: Ice Chips  Diet effective now                 EDUCATION NEEDS:   Not appropriate for education at this time  Skin:  Skin Assessment: Skin Integrity Issues: Skin Integrity Issues:: Incisions,Other (Comment) Incisions: groin and neck Other: puncture groin  Last BM:  1/8  Height:    Ht Readings from Last 1 Encounters:  04/20/20 _0  (1.88 m)    Weight:   Wt Readings from Last 1 Encounters:  05/18/20 76.5 kg    Ideal Body Weight:  86.4 kg  BMI:  Body mass index is 21.65 kg/m.  Estimated Nutritional Needs:   Kcal:  2000-2200  Protein:  105-125 gm  Fluid:  >/= 2 L    Elisse Pennick AvLarkin Ina LDN RD pager number and weekend/on-call pager number located in [email or web address]

## 2020-05-18 NOTE — Progress Notes (Signed)
[place] for Lovenox / Warfarin Indication: on PTA warfarin for hx of DVT/PE, new acute stroke, new Afib  Patient Measurements: Height: 6\' 2"  (188 cm) Weight: 76.5 kg (168 lb 10.4 oz) IBW/kg (Calculated) : 82.2  Heparin weight: 83.1  Vital Signs: Temp: 97.7 F (36.5 C) (01/12 0844) Temp Source: Oral (01/12 0844) BP: 103/79 (01/12 0844) Pulse Rate: 82 (01/12 0844)  Labs: Recent Labs    05/15/20 1006 05/16/20 0430 05/16/20 0813 05/17/20 0508 05/18/20 0422  HGB  --  12.1*  --   --  13.5  HCT  --  37.7*  --   --  43.3  PLT  --  407*  --   --  269  LABPROT  --  14.1  --  15.1 15.5*  INR  --  1.1  --  1.2 1.3*  HEPARINUNFRC 0.59  --  <0.10*  --   --   CREATININE  --   --   --   --  0.65    Estimated Creatinine Clearance: 78.4 mL/min (by C-G formula based on SCr of 0.65 mg/dL).  Assessment: 80 yo male with L symptomatic carotid stenosis.  S/p L carotid TCAR procedure w/ stenting that was complicated by postprocedure stroke.  MRI brain shows patchy L basal ganglia and L frontoparietal cortical and subcortical infarcts (new). No hemorrhagic transformation. New stroke and new onset atrial fibrillation. Previous reported hematuria but appears to have resolved.  PTA warfarin dose: 2.5mg  daily except 1.25mg  on Mondays.  Heparin switched to Lovenox 1/10 INR slow to increase, requiring higher doses than PTA  Goal of Therapy:   Appropriate Lovenox dosing INR goal 2-3  Monitor platelets by anticoagulation protocol: Yes    Plan: Lovenox 75 mg sq Q 12 hours Warfarin 7.5 mg po x 1 today Daily INR  Thank you Anette Guarneri, PharmD [phone] 05/18/2020 9:00 AM

## 2020-05-18 NOTE — TOC Progression Note (Addendum)
Transition of Care [facility]) - Progression Note    Patient Details  Name: John Lambert MRN: [id number] Date of Birth: 1941/02/04  Transition of Care [facility]) CM/SW [place], [place] Phone Number: 05/18/2020, 3:12 PM  Clinical Narrative:   CSW received update from MD that patient will need to remain on isolation for another 5 days. CSW spoke with patient's daughter to provide update, and she was disappointed and tearful during discussion. Daughter was hoping the patient would be able to leave the hospital sooner because he has been in for so long. CSW also updated [name] on isolation recommendations. [name] is still reviewing the referral and figuring out if they can take the patient, but are unable to take him until after he is off of isolation precautions. CSW to continue to follow.  UPDATE 4:47 PM: CSW received call from patient's daughter and spouse, asking about anything else that can be done to get the patient off of isolation, as they are really disappointed that he can't seem to get out of the hospital. CSW discussed checking again on other SNF options that may be able to take him COVID+, and family is agreeable. CSW reached out to [facility], who is taking COVID+ patients to see if they have any beds available and could take the patient, as they have not responded to referral. [facility] to review and will get back to CSW. Family would also like to talk to MD soon, CSW reassured them that MD would call as soon as able. CSW sent a message with request for MD to contact family to answer questions about continued isolation. CSW to follow.    Expected Discharge Plan: [place] Barriers to Discharge: SNF Covid,Continued Medical Work up,Insurance Authorization  Expected Discharge Plan and Services Expected Discharge Plan: [place] In-house Referral: Clinical Social Work Discharge Planning Services: CM Consult Post Acute Care Choice:  [place] arrangements for the past 2 months: Single Family Home Expected Discharge Date: 05/18/20                                     Social Determinants of Health (SDOH) Interventions    Readmission Risk Interventions No flowsheet data found.

## 2020-05-19 ENCOUNTER — Other Ambulatory Visit: Payer: Self-pay | Admitting: *Deleted

## 2020-05-19 DIAGNOSIS — I6529 Occlusion and stenosis of unspecified carotid artery: Secondary | ICD-10-CM

## 2020-05-19 LAB — GLUCOSE, CAPILLARY
Glucose-Capillary: 100 mg/dL — ABNORMAL HIGH (ref 70–99)
Glucose-Capillary: 106 mg/dL — ABNORMAL HIGH (ref 70–99)
Glucose-Capillary: 108 mg/dL — ABNORMAL HIGH (ref 70–99)
Glucose-Capillary: 130 mg/dL — ABNORMAL HIGH (ref 70–99)
Glucose-Capillary: 137 mg/dL — ABNORMAL HIGH (ref 70–99)
Glucose-Capillary: 141 mg/dL — ABNORMAL HIGH (ref 70–99)
Glucose-Capillary: 166 mg/dL — ABNORMAL HIGH (ref 70–99)

## 2020-05-19 LAB — CBC
HCT: 42.5 % (ref 39.0–52.0)
Hemoglobin: 14.3 g/dL (ref 13.0–17.0)
MCH: 30.6 pg (ref 26.0–34.0)
MCHC: 33.6 g/dL (ref 30.0–36.0)
MCV: 91 fL (ref 80.0–100.0)
Platelets: 357 10*3/uL (ref 150–400)
RBC: 4.67 MIL/uL (ref 4.22–5.81)
RDW: 14.3 % (ref 11.5–15.5)
WBC: 9.1 10*3/uL (ref 4.0–10.5)
nRBC: 0 % (ref 0.0–0.2)

## 2020-05-19 LAB — COMPREHENSIVE METABOLIC PANEL
ALT: 61 U/L — ABNORMAL HIGH (ref 0–44)
AST: 51 U/L — ABNORMAL HIGH (ref 15–41)
Albumin: 3.2 g/dL — ABNORMAL LOW (ref 3.5–5.0)
Alkaline Phosphatase: 52 U/L (ref 38–126)
Anion gap: 11 (ref 5–15)
BUN: 28 mg/dL — ABNORMAL HIGH (ref 8–23)
CO2: 26 mmol/L (ref 22–32)
Calcium: 9 mg/dL (ref 8.9–10.3)
Chloride: 98 mmol/L (ref 98–111)
Creatinine, Ser: 0.76 mg/dL (ref 0.61–1.24)
GFR, Estimated: >60 mL/min (ref >60)
Glucose, Bld: 118 mg/dL — ABNORMAL HIGH (ref 70–99)
Potassium: 4.6 mmol/L (ref 3.5–5.1)
Sodium: 135 mmol/L (ref 135–145)
Total Bilirubin: 1.1 mg/dL (ref 0.3–1.2)
Total Protein: 6.8 g/dL (ref 6.5–8.1)

## 2020-05-19 LAB — PROTIME-INR
INR: 1.4 — ABNORMAL HIGH (ref 0.8–1.2)
Prothrombin Time: 17 seconds — ABNORMAL HIGH (ref 11.4–15.2)

## 2020-05-19 MED ORDER — SENNOSIDES-DOCUSATE SODIUM 8.6-50 MG PO TABS
1.0000 | ORAL_TABLET | Freq: Every day | ORAL | Status: DC
  Administered 2020-05-20 – 2020-05-21 (×2): 1 via ORAL
  Filled 2020-05-19 (×3): qty 1

## 2020-05-19 MED ORDER — WARFARIN SODIUM 7.5 MG PO TABS
7.5000 mg | ORAL_TABLET | Freq: Once | ORAL | Status: AC
  Administered 2020-05-19: 7.5 mg via ORAL
  Filled 2020-05-19: qty 1

## 2020-05-19 NOTE — Progress Notes (Signed)
ANTICOAGULATION CONSULT NOTE  Pharmacy Consult for Lovenox / Warfarin Indication: on PTA warfarin for hx of DVT/PE, new acute stroke, new Afib  Patient Measurements: Height: 6\' 2"  (188 cm) Weight: 76.5 kg (168 lb 10.4 oz) IBW/kg (Calculated) : 82.2  Heparin weight: 83.1  Vital Signs: Temp: 98.4 F (36.9 C) (01/13 0815) Temp Source: Axillary (01/13 0815) BP: 119/68 (01/13 0815) Pulse Rate: 109 (01/13 0815)  Labs: Recent Labs    05/17/20 0508 05/18/20 0422 05/19/20 0408  HGB  --  13.5 14.3  HCT  --  43.3 42.5  PLT  --  269 357  LABPROT 15.1 15.5* 17.0*  INR 1.2 1.3* 1.4*  CREATININE  --  0.65 0.76    Estimated Creatinine Clearance: 78.4 mL/min (by C-G formula based on SCr of 0.76 mg/dL).  Assessment: 80 yo male with L symptomatic carotid stenosis.  S/p L carotid TCAR procedure w/ stenting that was complicated by postprocedure stroke.  MRI brain shows patchy L basal ganglia and L frontoparietal cortical and subcortical infarcts (new). No hemorrhagic transformation. New stroke and new onset atrial fibrillation. Previous reported hematuria but appears to have resolved.  PTA warfarin dose: 2.5mg  daily except 1.25mg  on Mondays.  Heparin switched to Lovenox 1/10 INR slow to increase, requiring higher doses than PTA  Goal of Therapy:   Appropriate Lovenox dosing INR goal 2-3  Monitor platelets by anticoagulation protocol: Yes    Plan: Lovenox 75 mg sq Q 12 hours Repeat Warfarin 7.5 mg po x 1 today Daily INR  Thank you Anette Guarneri, PharmD [phone] 05/19/2020 8:33 AM

## 2020-05-19 NOTE — Plan of Care (Signed)
  Problem: Clinical Measurements: Goal: Will remain free from infection Outcome: Not Progressing   Problem: Activity: Goal: Risk for activity intolerance will decrease Outcome: Not Progressing   Problem: Elimination: Goal: Will not experience complications related to urinary retention Outcome: Not Progressing

## 2020-05-19 NOTE — Progress Notes (Signed)
  Speech Language Pathology Treatment: Cognitive-Linquistic  Patient Details Name: John Lambert MRN: [id number] DOB: 21-Jul-1940 Today's Date: 05/19/2020 Time: 1000-1045 SLP Time Calculation (min) (ACUTE ONLY): 45 min  Assessment / Plan / Recommendation Clinical Impression  Pt alert, engaged in session though often shaking head no when asked to complete a task. He does sometimes correct this to a nod yes and it appears that his apraxia is interfering with his accuracy with head nods and shakes as reported in prior sessions. Targeted comprehension of situation today, COVID 19 infection, isolation and reason for family not being present. Used visual and written feed back with pt point to words in a choice of four to demonstrate his understanding of situation with 100% accuracy, which was reported to his family via facetime. Offered verbal and visual feed back (wife via facetime) to shape pts phonation to highly visible bilabial syllables /ma/ and /ba/ x3 with heavy encouragement needed. Provided ice chips with cues for effortful swallow x5 with subsequent coughing. Will continue to work towards goals.   HPI HPI: Pt is an 80 y.o. 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 He also has pain in left upper leg/hip region, and R foot drag; Imaging notable for Numerous scattered punctate acute infarctions in the left  posterior frontal region consistent with micro embolic infarctions  which could either be in the anterior or middle cerebral artery  territory. Punctate acute infarction also affecting the splenium of  the corpus callosum. Pt was seen by SLP on 12/17 for a BSE and SLE with no subsequent need for SLP services. Left carotid TCAR procedure with stent placement conducted on 12/22. Following the procedure he was found to be aphasic unable  to speak with left gaze deviation and right hemiparesis. Emergent CTA showed patent left carotid stent with good flow in the terminal left ICA as well as middle cerebral artery in the M1 segment. There appeared to be decreased flow in one of the M2 branches by neurology. SLP was consulted due to pt failing the [place], but a failed swallow screen has not been documented in EMR. EEG 12/22: No seizures. MRI 12/23: New areas of acute infarction are present scattered within the  left basal ganglia and radiating white matter tracts and within the  left frontoparietal cortical and subcortical brain.EEG 12/30: cortical dysfunction in left hemisphere, maximal left fronto-temporal region likely secondary to underlying stroke/structural abnormality. No seizures      SLP Plan  Continue with current plan of care       Recommendations  Diet recommendations: NPO                SLP Visit Diagnosis: Dysphagia, oropharyngeal phase (R13.12);Aphasia (R47.01) Plan: Continue with current plan of care       GO               Herbie [place], [place] Pager [phone] Office [phone]  Lynann Beaver 05/19/2020, 12:15 PM

## 2020-05-19 NOTE — Progress Notes (Signed)
[facility] PROGRESS NOTE    John Lambert  PFX:[id number] DOB: 13-Aug-1940 DOA: 04/19/2020 PCP: Harlan Stains, MD      Brief Narrative:  John Lambert is a 80 y.o. 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  Admitted, and found to have 80-99% L ICA stenosis, vascular surgery were consulted and patient had LEFT TCAR on 12/22, after which he developed aphasia, left gaze deviation and RIGHT hemiparesis and CODE STROKE called in the PACU.  Emergent LEFT cerebral angiogram showed no new findings.  Subsequent hospital stay complicated by hematuria due to UTI, urinary retention, dysphagia requiring PEG placement, and incidental COVID infection.    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 Trula Slade on 12/22 Post TCAR recurrent acute embolic CVA with right hemiparesis and aphasia  - Continue aspirin, Plavix, and warfarin - Continue Plavix for 1 month, transition to aspirin and warfarin alone at Jan 22  - Continue pravastatin     COVID-19 Mild, CXR clear, no O2 requirement, remdesivir discussed, but joint decision making with family, this was deferred.  Still asymptomatic.   -D/c precautions in morning on 1/17  Per compassionate care clause of No Patient Left Behind law, nursing should provide family with safe access to visit this patient, who is dependent for eating, and at risk of dehydration and weight loss due  to his illness.     History of recurrent VTE Unclear cause.  On lifelong warfarin.  Family have declined DOAC. - Continue Lovenox bridge - Continue warfarin  Hypertension BP normal - Continue carvedilol  New diagnosis paroxysmal atrial fibrillation Warfarin still subtherapeutic.    - Continue warfarin with Lovenox bridge - Continue carvedilol  Gross hematuria, resolved, due to Klebsiella UTI - Continue finasteride  Acute retention of urine Foley placed, urology consulted.  They recommend leaving foley until outpatient Urology follow up is complete. - Continue foley indwelling - Outpatient Urology follow up  Dysphagia S/p PEG placement 1/6 by IR - Continue tube feeds - SLP therapy  AKI Resolved           Disposition: Status is: Inpatient  Remains inpatient appropriate because:Unsafe d/c plan   Dispo: The patient is from: Home              Anticipated d/c is to: SNF              Anticipated d/c date is: 1 day              Patient currently is medically stable to d/c.              MDM: The below labs and imaging reports were reviewed and summarized above.  Medication management as above.  .     DVT prophylaxis: Lovenox  Code Status: FULL Family Communication: Daughter and wife by phone    Consultants:   ID  Urology  Vascular Surgery  Neurology  Palliative Care  Radiology    Antimicrobials:   Ceftriaxone 12/28 >>  1/4                   Keflex 1/4 >> 1/7             Subjective: No new fever, hematuria, chest discomfort, cough, vomiting.  Cough is present at times.       Objective: Vitals:   05/19/20 0050 05/19/20 0440 05/19/20 0815 05/19/20 1327  BP: 117/76 118/70 119/68 110/66  Pulse: 87 90 (!) 109 89  Resp: 20  19 (!) 24  Temp: (!) 97.5 F (36.4 C) 97.9 F (36.6 C) 98.4 F (36.9 C) 98.7 F (37.1 C)  TempSrc: Oral Oral Axillary Axillary  SpO2: 96% 97% 94% 96%  Weight:      Height:         Intake/Output Summary (Last 24 hours) at 05/19/2020 1517 Last data filed at 05/19/2020 1402 Gross per 24 hour  Intake -  Output 950 ml  Net -950 ml   Filed Weights   05/12/20 0421 05/13/20 0256 05/18/20 0500  Weight: 80.6 kg 79.3 kg 76.5 kg    Examination: General appearance: Thin elderly male, interacting with his wife on video phone.  Seems sleepy.     HEENT: Dentition in good repair, oropharynx tacky dry, no oral lesions, hearing normal Skin:  Cardiac: Regular rate and rhythm, no murmurs, no lower extremity edema Respiratory: Normal respiratory rate and rhythm, lung sounds diminished, atelectatic crackles with deep breathing.  No wheezing. Abdomen: Abdomen soft without tenderness palpation or guarding, PEG tube in place, no ascites or distention MSK:  Neuro: Awake but sleepy, right hemiparesis, head preferred to the left. Psych: Sensorium intact, appears to respond to questions and follow commands consistently, attention Sleepy, affect blunted         Data Reviewed: I have personally reviewed following labs and imaging studies:  CBC: Recent Labs  Lab 05/14/20 0453 05/15/20 0246 05/16/20 0430 05/18/20 0422 05/19/20 0408  WBC 7.4 5.2 4.8 6.1 9.1  HGB 11.2* 11.1* 12.1* 13.5 14.3  HCT 35.5* 33.8* 37.7* 43.3 42.5  MCV 95.7 93.1 94.3 95.0 91.0  PLT 422* 389 407* 269 [id number]   Basic Metabolic Panel: Recent Labs  Lab 05/13/20 0204 05/14/20 0453 05/15/20 0246 05/18/20 0422 05/19/20 0408  NA 136 135 135 135 135  K 4.3 4.6 4.2 4.7 4.6  CL 105 105 103 101 98  CO2 23 21* 24 23 26   GLUCOSE 103* 105* 125* 131* 118*  BUN 26* 25* 24* 24* 28*  CREATININE 0.77 0.74 0.66 0.65 0.76  CALCIUM 8.2* 8.3* 8.1* 9.0 9.0   GFR: Estimated Creatinine Clearance: 78.4 mL/min (by C-G formula based on SCr of 0.76 mg/dL). Liver Function Tests: Recent Labs  Lab 05/13/20 0204 05/14/20 0453 05/19/20 0408  AST 41 36 51*  ALT 61* 47* 61*  ALKPHOS 44 40 52  BILITOT 1.0 0.9 1.1   PROT 5.9* 5.9* 6.8  ALBUMIN 2.8* 2.8* 3.2*   No results for input(s): LIPASE, AMYLASE in the last 168 hours. No results for input(s): AMMONIA in the last 168 hours. Coagulation Profile: Recent Labs  Lab 05/15/20 0246 05/16/20 0430 05/17/20 0508 05/18/20 0422 05/19/20 0408  INR 1.3* 1.1 1.2 1.3* 1.4*   Cardiac Enzymes: No results for input(s): CKTOTAL, CKMB, CKMBINDEX, TROPONINI in the last 168 hours. BNP (last 3 results) No results for input(s): PROBNP in the last 8760 hours. HbA1C: No results for input(s): HGBA1C in the last 72 hours.  CBG: Recent Labs  Lab 05/18/20 2053 05/19/20 0036 05/19/20 0420 05/19/20 0819 05/19/20 1240  GLUCAP 136* 100* 108* 166* 141*   Lipid Profile: No results for input(s): CHOL, HDL, LDLCALC, TRIG, CHOLHDL, LDLDIRECT in the last 72 hours. Thyroid Function Tests: No results for input(s): TSH, T4TOTAL, FREET4, T3FREE, THYROIDAB in the last 72 hours. Anemia Panel: No results for input(s): VITAMINB12, FOLATE, FERRITIN, TIBC, IRON, RETICCTPCT in the last 72 hours. Urine analysis:    Component Value Date/Time   COLORURINE YELLOW 05/04/2020 1143   APPEARANCEUR CLOUDY (A) 05/04/2020 1143   LABSPEC 1.015 05/04/2020 1143   PHURINE 5.0 05/04/2020 1143   GLUCOSEU NEGATIVE 05/04/2020 1143   HGBUR LARGE (A) 05/04/2020 1143   BILIRUBINUR NEGATIVE 05/04/2020 1143   KETONESUR NEGATIVE 05/04/2020 1143   PROTEINUR 100 (A) 05/04/2020 1143   NITRITE NEGATIVE 05/04/2020 1143   LEUKOCYTESUR LARGE (A) 05/04/2020 1143   Sepsis Labs: @LABRCNTIP (procalcitonin:4,lacticacidven:4)  ) Recent Results (from the past 240 hour(s))  Surgical PCR screen     Status: Abnormal   Collection Time: 05/12/20 12:25 AM   Specimen: Nasal Mucosa; Nasal Swab  Result Value Ref Range Status   MRSA, PCR NEGATIVE NEGATIVE Final   Staphylococcus aureus POSITIVE (A) NEGATIVE Final    Comment: (NOTE) The Xpert SA Assay (FDA approved for NASAL specimens in patients 62 years of  age and older), is one component of a comprehensive surveillance program. It is not intended to diagnose infection nor to guide or monitor treatment. Performed at [facility], [place] [address] [place], [place] [address]   SARS CORONAVIRUS 2 (TAT 6-24 HRS) Nasopharyngeal Nasopharyngeal Swab     Status: Abnormal   Collection Time: 05/13/20 12:03 AM   Specimen: Nasopharyngeal Swab  Result Value Ref Range Status   SARS Coronavirus 2 POSITIVE (A) NEGATIVE Final    Comment: (NOTE) SARS-CoV-2 target nucleic acids are DETECTED.  The SARS-CoV-2 RNA is generally detectable in upper and lower respiratory specimens during the acute phase of infection. Positive results are indicative of the presence of SARS-CoV-2 RNA. Clinical correlation with patient history and other diagnostic information is  necessary to determine patient infection status. Positive results do not rule out bacterial infection or co-infection with other viruses.  The expected result is Negative.  Fact Sheet for Patients: SugarRoll.be  Fact Sheet for Healthcare Providers: https://www.woods-mathews.com/  This test is not yet approved or cleared by the [place] FDA and  has been authorized for detection and/or diagnosis of SARS-CoV-2 by FDA under an Emergency Use Authorization (EUA). This EUA will remain  in effect (meaning this test can be used) for the duration of the COVID-19 declaration under Section 564(b)(1) of the Act, 21 U. S.C. section 360bbb-3(b)(1), unless the authorization is terminated or revoked sooner.   Performed at [facility], [place] [address], [place], [place] [address]          Radiology Studies: No results found.      Scheduled Meds: . aspirin  81 mg Per Tube Daily  . carvedilol  12.5 mg Per Tube BID WC  . Chlorhexidine Gluconate Cloth  6 each Topical Daily  . clopidogrel  75 mg Per Tube Daily  . enoxaparin (LOVENOX) injection  75  mg Subcutaneous Q12H  . feeding supplement (PROSource TF)  45 mL Per Tube BID  . finasteride  5 mg Oral Daily  . free water  200 mL Per Tube Q6H  . guaiFENesin-dextromethorphan  5 mL Oral Q6H  . insulin aspart  0-9 Units Subcutaneous Q4H  . pravastatin  40 mg Per Tube Daily  . senna-docusate  1 tablet Oral QHS  . warfarin  7.5 mg Oral ONCE-1600  . Warfarin - Pharmacist Dosing Inpatient   Does not apply q1600   Continuous Infusions: . feeding supplement (JEVITY 1.5 CAL/FIBER) 1,000 mL (05/18/20 2121)     LOS: 28 days    Time spent: 25 minutes    Edwin Dada, MD [facility] 05/19/2020, 3:17 PM     Please page though [place] or Epic secure chat:  For [employer], Adult nurse

## 2020-05-19 NOTE — Progress Notes (Signed)
Spoke with patient wife and daughter, answered all questions they had. Family is ok at this time.

## 2020-05-19 NOTE — Plan of Care (Signed)
  Problem: Health Behavior/Discharge Planning: Goal: Ability to manage health-related needs will improve Outcome: Progressing   Problem: Clinical Measurements: Goal: Will remain free from infection Outcome: Progressing Goal: Diagnostic test results will improve Outcome: Progressing Goal: Respiratory complications will improve Outcome: Progressing Goal: Cardiovascular complication will be avoided Outcome: Progressing   Problem: Activity: Goal: Risk for activity intolerance will decrease Outcome: Progressing   Problem: Nutrition: Goal: Adequate nutrition will be maintained Outcome: Progressing   Problem: Coping: Goal: Level of anxiety will decrease Outcome: Progressing   Problem: Elimination: Goal: Will not experience complications related to bowel motility Outcome: Progressing Goal: Will not experience complications related to urinary retention Outcome: Progressing   Problem: Pain Managment: Goal: General experience of comfort will improve Outcome: Progressing   Problem: Safety: Goal: Ability to remain free from injury will improve Outcome: Progressing   Problem: Skin Integrity: Goal: Risk for impaired skin integrity will decrease Outcome: Progressing   Problem: Education: Goal: Knowledge of disease or condition will improve Outcome: Progressing Goal: Knowledge of secondary prevention will improve Outcome: Progressing Goal: Knowledge of patient specific risk factors addressed and post discharge goals established will improve Outcome: Progressing Goal: Individualized Educational Video(s) Outcome: Progressing   Problem: Coping: Goal: Will verbalize positive feelings about self Outcome: Progressing Goal: Will identify appropriate support needs Outcome: Progressing   Problem: Health Behavior/Discharge Planning: Goal: Ability to manage health-related needs will improve Outcome: Progressing   Problem: Self-Care: Goal: Verbalization of feelings and concerns  over difficulty with self-care will improve Outcome: Progressing   Problem: Nutrition: Goal: Risk of aspiration will decrease Outcome: Progressing Goal: Dietary intake will improve Outcome: Progressing   Problem: Ischemic Stroke/TIA Tissue Perfusion: Goal: Complications of ischemic stroke/TIA will be minimized Outcome: Progressing

## 2020-05-19 NOTE — Progress Notes (Signed)
Physical Therapy Treatment Patient Details Name: John Lambert MRN: [id number] DOB: 1940-09-14 Today's Date: 05/19/2020    History of Present Illness John Lambert is a 80 y.o. male admitted 12/14 with bil LE weakness and difficulty walking as well as left hip pain. Imaging notable for Numerous scattered punctate acute infarctions in the left  posterior frontal region consistent with micro embolic infarctions. Punctate acute infarction also affecting the splenium of  the corpus callosum.  Pt with L carotid artery stenosis s/p stent on 04/27/20. Post procedure pt with right hemiparesis, aphasia and left gaze with intraprocedural stroke. Code stroke called with CT head (-) for acute findings. Emergent L carotid diagnostic cerebral cath angiogram (-) for acute findings. MRI 12/23 (+) patchy left basal ganglia as well as left frontoparietal cortical and subcortical infarcts. Tested COVID + on 05/13/2020. 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    PT Comments    Pt limited by fatigue this session. PT attempts to initiate sitting edge of bed when the pt is found to be incontinent of stool. Pt requires significant assistance to roll and declines further mobility due to endurance deficits. Pt requires encouragement to participate in LE HEP with assistance required for all RLE exercise due to weakness. Pt will benefit from continued acute PT POC to reduce caregiver burden and to improve activity tolerance.  Follow Up Recommendations  SNF;Supervision/Assistance - 24 hour     Equipment Recommendations  Wheelchair (measurements PT);Wheelchair cushion (measurements PT);Hospital bed    Recommendations for Other Services       Precautions / Restrictions Precautions Precautions: Fall Precaution Comments: PEG, R HEMI, COVID    Mobility  Bed Mobility Overal bed mobility: Needs Assistance Bed Mobility: Rolling Rolling: Max assist          General bed mobility comments: pt rolls left/right 4 times total durign session to assist in hygiene tasks. Pt declines further bed mobility  Transfers                    Ambulation/Gait                 Stairs             Wheelchair Mobility    Modified Rankin (Stroke Patients Only) Modified Rankin (Stroke Patients Only) Pre-Morbid Rankin Score: No significant disability Modified Rankin: Severe disability     Balance                                            Cognition Arousal/Alertness: Awake/alert Behavior During Therapy: Flat affect Overall Cognitive Status: Difficult to assess                                 General Comments: pt communicated with yes/no head nods this session, follows one step commands well      Exercises General Exercises - Lower Extremity Ankle Circles/Pumps: AROM;Both;10 reps Quad Sets: AROM;Both;5 reps Heel Slides: AAROM;Both;5 reps Hip ABduction/ADduction: AAROM;Both;5 reps    General Comments General comments (skin integrity, edema, etc.): VSS on RA      Pertinent Vitals/Pain Pain Assessment: Faces Faces Pain Scale: Hurts even more Pain Location: generalized with mobility Pain Descriptors / Indicators: Grimacing Pain Intervention(s): Monitored during session    Home  Living                      Prior Function            PT Goals (current goals can now be found in the care plan section) Acute Rehab PT Goals Patient Stated Goal: none stated Progress towards PT goals: Not progressing toward goals - comment (limited by fatigue)    Frequency    Min 2X/week      PT Plan Current plan remains appropriate    Co-evaluation              AM-PAC PT "6 Clicks" Mobility   Outcome Measure  Help needed turning from your back to your side while in a flat bed without using bedrails?: A Lot Help needed moving from lying on your back to sitting on the side of a flat  bed without using bedrails?: Total Help needed moving to and from a bed to a chair (including a wheelchair)?: Total Help needed standing up from a chair using your arms (e.g., wheelchair or bedside chair)?: Total Help needed to walk in hospital room?: Total Help needed climbing 3-5 steps with a railing? : Total 6 Click Score: 7    End of Session   Activity Tolerance: Patient limited by fatigue Patient left: in bed;with call bell/phone within reach;with bed alarm set;with SCD's reapplied Nurse Communication: Mobility status;Need for lift equipment PT Visit Diagnosis: Muscle weakness (generalized) (M62.81);History of falling (Z91.81);Unsteadiness on feet (R26.81);Other abnormalities of gait and mobility (R26.89);Difficulty in walking, not elsewhere classified (R26.2);Other symptoms and signs involving the nervous system (R29.898);Hemiplegia and hemiparesis Hemiplegia - Right/Left: Right Hemiplegia - caused by: Cerebral infarction     Time: 1520-1555 PT Time Calculation (min) (ACUTE ONLY): 35 min  Charges:  $Therapeutic Exercise: 8-22 mins $Therapeutic Activity: 8-22 mins                     Zenaida Niece, PT, DPT Acute Rehabilitation Pager: [phone]    Zenaida Niece 05/19/2020, 4:33 PM

## 2020-05-19 NOTE — TOC Progression Note (Signed)
Transition of Care [facility]) - Progression Note    Patient Details  Name: KHALIK PEWITT MRN: [id number] Date of Birth: Oct 20, 1940  Transition of Care [facility]) CM/SW [place], [place] Phone Number: 05/19/2020, 4:06 PM  Clinical Narrative:   CSW received call from daughter, [facility], this morning that they were still hopeful for [name], and not interested in bouncing the patient around just to get him out of the hospital. CSW then reached out to [name] to ask about referral status, and they are hopeful to take the patient once he is off isolation precautions, as nothing else changes on his condition. CSW did reach out to [facility] to ask about COVID beds, and they have not responded; yesterday, they did not have one. CSW contacted Hope to update her on referral status and hopeful admission to [name] once he is off precautions. Daughter appreciative of update.    Expected Discharge Plan: [place] Barriers to Discharge: SNF Covid,Continued Medical Work up,Insurance Authorization  Expected Discharge Plan and Services Expected Discharge Plan: [place] In-house Referral: Clinical Social Work Discharge Planning Services: CM Consult Post Acute Care Choice: New [place] arrangements for the past 2 months: Single Family Home Expected Discharge Date: 05/18/20                                     Social Determinants of Health (SDOH) Interventions    Readmission Risk Interventions No flowsheet data found.

## 2020-05-19 NOTE — Progress Notes (Signed)
Placed phone by patient ear for daughter to speak with patient. Patient was aware of her speaking to him and followed some of her commands.

## 2020-05-20 LAB — GLUCOSE, CAPILLARY
Glucose-Capillary: 111 mg/dL — ABNORMAL HIGH (ref 70–99)
Glucose-Capillary: 134 mg/dL — ABNORMAL HIGH (ref 70–99)
Glucose-Capillary: 122 mg/dL — ABNORMAL HIGH (ref 70–99)
Glucose-Capillary: 146 mg/dL — ABNORMAL HIGH (ref 70–99)
Glucose-Capillary: 121 mg/dL — ABNORMAL HIGH (ref 70–99)

## 2020-05-20 LAB — PROTIME-INR
INR: 2 — ABNORMAL HIGH (ref 0.8–1.2)
Prothrombin Time: 21.9 seconds — ABNORMAL HIGH (ref 11.4–15.2)

## 2020-05-20 MED ORDER — CARVEDILOL 6.25 MG PO TABS
6.2500 mg | ORAL_TABLET | Freq: Two times a day (BID) | ORAL | Status: DC
  Administered 2020-05-20 – 2020-05-25 (×10): 6.25 mg
  Filled 2020-05-20 (×10): qty 1

## 2020-05-20 MED ORDER — WARFARIN SODIUM 2.5 MG PO TABS
2.5000 mg | ORAL_TABLET | Freq: Once | ORAL | Status: AC
  Administered 2020-05-20: 2.5 mg via ORAL
  Filled 2020-05-20: qty 1

## 2020-05-20 NOTE — Progress Notes (Signed)
[facility] PROGRESS NOTE    JAEQUAN PROPES  BMW:[id number] DOB: April 13, 1941 DOA: 04/19/2020 PCP: Harlan Stains, MD      Brief Narrative:  Mr. John Lambert is a 80 y.o. 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  Admitted, and found to have 80-99% L ICA stenosis, vascular surgery were consulted and patient had LEFT TCAR on 12/22, after which he developed aphasia, left gaze deviation and RIGHT hemiparesis and CODE STROKE called in the PACU.  Emergent LEFT cerebral angiogram showed no new findings.  Hospital stay complicated by hematuria due to UTI, urinary retention, dysphagia requiring PEG placement, and incidental COVID infection.    12/14 -- Admitted 12/15 -- Vascular surgery consulted 12/22 -- LEFT TCAR performed; acute change noted in PACU, found to have new stroke  12/28-- seen by palliative care: no MOST form done 12/29 -- Gross hematuria noted; Urology consulted 1/3 -- Persistent dysphagia; IR consulted 1/6 -- IR PEG placement 1/7 -- Incidental COVID noted due to cough; ID consulted     Assessment & Plan:  Acute embolic CVA due to left carotid atherosclerosis --S/P LEFT TCAR by Dr. Trula Slade on 12/22 Post TCAR recurrent acute embolic CVA with right hemiparesis and aphasia - Continue aspirin, Plavix, and warfarin - Continue Plavix for 1 month, transition to aspirin and warfarin alone at Jan 22 - Continue pravastatin  COVID-19 Mild, CXR clear, no O2 requirement, remdesivir discussed, but joint decision making with family, this was deferred.  Still asymptomatic.   -D/c precautions in morning on 1/17  History of recurrent VTE Unclear cause.  On lifelong warfarin.  Family have declined DOAC. - Continue Lovenox bridge (if INR therapeutic on AM in 1/15 should be able to d/c) -  Continue warfarin per pharmacy  Hypertension BP <100 at times - Continue carvedilol but at a lower dose  New diagnosis paroxysmal atrial fibrillation  - Continue warfarin with Lovenox bridge - Continue carvedilol  Gross hematuria,  due to Klebsiella UTI - Continue finasteride  Acute retention of urine Foley placed, urology consulted.  They recommend leaving foley until outpatient Urology follow up is complete. - Continue foley indwelling - Outpatient Urology follow up  Dysphagia S/p PEG placement 1/6 by IR - Continue tube feeds - SLP therapy  AKI Resolved  H/o depression -appears depressed -family does not want to start SSRI   Disposition: Status is: Inpatient  Remains inpatient appropriate because:Unsafe d/c plan   Dispo: The patient is from: Home              Anticipated d/c is to: SNF              Anticipated d/c date is: 1/17              Patient currently is medically stable to d/c. when airborne precautions can be d/c'd        DVT prophylaxis: Lovenox/warfarin Code Status: FULL     Consultants:   ID  Urology  Vascular Surgery  Neurology  Palliative Care  Radiology     Subjective: Appears frustrated by not being able to communicate needs     Objective: Vitals:   05/20/20 0000 05/20/20 0329 05/20/20 0859 05/20/20 1128  BP: 106/72 104/79 100/64 96/68  Pulse: (!) 53 (!) 102 83 79  Resp: (!) 22 20 18 19   Temp: Marland Kitchen)  97.5 F (36.4 C) 99.3 F (37.4 C) 97.8 F (36.6 C) 98.4 F (36.9 C)  TempSrc: Oral Axillary Oral Oral  SpO2: 94% 94% 95% 96%  Weight:      Height:        Intake/Output Summary (Last 24 hours) at 05/20/2020 1343 Last data filed at 05/20/2020 1135 Gross per 24 hour  Intake 2200 ml  Output 1400 ml  Net 800 ml   Filed Weights   05/12/20 0421 05/13/20 0256 05/18/20 0500  Weight: 80.6 kg 79.3 kg 76.5 kg    Examination:  General: Appearance:    Chronically ill appearing male      Lungs:     respirations  unlabored  Heart:    Normal heart rate.   MS:   All extremities are intact.   Neurologic:   withdrawn     Data Reviewed: I have personally reviewed following labs and imaging studies:  CBC: Recent Labs  Lab 05/14/20 0453 05/15/20 0246 05/16/20 0430 05/18/20 0422 05/19/20 0408  WBC 7.4 5.2 4.8 6.1 9.1  HGB 11.2* 11.1* 12.1* 13.5 14.3  HCT 35.5* 33.8* 37.7* 43.3 42.5  MCV 95.7 93.1 94.3 95.0 91.0  PLT 422* 389 407* 269 [id number]   Basic Metabolic Panel: Recent Labs  Lab 05/14/20 0453 05/15/20 0246 05/18/20 0422 05/19/20 0408  NA 135 135 135 135  K 4.6 4.2 4.7 4.6  CL 105 103 101 98  CO2 21* 24 23 26   GLUCOSE 105* 125* 131* 118*  BUN 25* 24* 24* 28*  CREATININE 0.74 0.66 0.65 0.76  CALCIUM 8.3* 8.1* 9.0 9.0   GFR: Estimated Creatinine Clearance: 78.4 mL/min (by C-G formula based on SCr of 0.76 mg/dL). Liver Function Tests: Recent Labs  Lab 05/14/20 0453 05/19/20 0408  AST 36 51*  ALT 47* 61*  ALKPHOS 40 52  BILITOT 0.9 1.1  PROT 5.9* 6.8  ALBUMIN 2.8* 3.2*   No results for input(s): LIPASE, AMYLASE in the last 168 hours. No results for input(s): AMMONIA in the last 168 hours. Coagulation Profile: Recent Labs  Lab 05/16/20 0430 05/17/20 0508 05/18/20 0422 05/19/20 0408 05/20/20 0456  INR 1.1 1.2 1.3* 1.4* 2.0*   Cardiac Enzymes: No results for input(s): CKTOTAL, CKMB, CKMBINDEX, TROPONINI in the last 168 hours. BNP (last 3 results) No results for input(s): PROBNP in the last 8760 hours. HbA1C: No results for input(s): HGBA1C in the last 72 hours. CBG: Recent Labs  Lab 05/19/20 2009 05/19/20 2355 05/20/20 0322 05/20/20 0901 05/20/20 1131  GLUCAP 130* 137* 111* 134* 122*   Lipid Profile: No results for input(s): CHOL, HDL, LDLCALC, TRIG, CHOLHDL, LDLDIRECT in the last 72 hours. Thyroid Function Tests: No results for input(s): TSH, T4TOTAL, FREET4, T3FREE, THYROIDAB in the last 72 hours. Anemia Panel: No results for input(s): VITAMINB12,  FOLATE, FERRITIN, TIBC, IRON, RETICCTPCT in the last 72 hours. Urine analysis:    Component Value Date/Time   COLORURINE YELLOW 05/04/2020 1143   APPEARANCEUR CLOUDY (A) 05/04/2020 1143   LABSPEC 1.015 05/04/2020 1143   PHURINE 5.0 05/04/2020 1143   GLUCOSEU NEGATIVE 05/04/2020 1143   HGBUR LARGE (A) 05/04/2020 1143   BILIRUBINUR NEGATIVE 05/04/2020 1143   KETONESUR NEGATIVE 05/04/2020 1143   PROTEINUR 100 (A) 05/04/2020 1143   NITRITE NEGATIVE 05/04/2020 1143   LEUKOCYTESUR LARGE (A) 05/04/2020 1143    Recent Results (from the past 240 hour(s))  Surgical PCR screen     Status: Abnormal   Collection Time: 05/12/20 12:25 AM   Specimen: Nasal Mucosa;  Nasal Swab  Result Value Ref Range Status   MRSA, PCR NEGATIVE NEGATIVE Final   Staphylococcus aureus POSITIVE (A) NEGATIVE Final    Comment: (NOTE) The Xpert SA Assay (FDA approved for NASAL specimens in patients 82 years of age and older), is one component of a comprehensive surveillance program. It is not intended to diagnose infection nor to guide or monitor treatment. Performed at [facility], [place] [address], [place], [place] [address]   SARS CORONAVIRUS 2 (TAT 6-24 HRS) Nasopharyngeal Nasopharyngeal Swab     Status: Abnormal   Collection Time: 05/13/20 12:03 AM   Specimen: Nasopharyngeal Swab  Result Value Ref Range Status   SARS Coronavirus 2 POSITIVE (A) NEGATIVE Final    Comment: (NOTE) SARS-CoV-2 target nucleic acids are DETECTED.  The SARS-CoV-2 RNA is generally detectable in upper and lower respiratory specimens during the acute phase of infection. Positive results are indicative of the presence of SARS-CoV-2 RNA. Clinical correlation with patient history and other diagnostic information is  necessary to determine patient infection status. Positive results do not rule out bacterial infection or co-infection with other viruses.  The expected result is Negative.  Fact Sheet for  Patients: SugarRoll.be  Fact Sheet for Healthcare Providers: https://www.woods-mathews.com/  This test is not yet approved or cleared by the [place] FDA and  has been authorized for detection and/or diagnosis of SARS-CoV-2 by FDA under an Emergency Use Authorization (EUA). This EUA will remain  in effect (meaning this test can be used) for the duration of the COVID-19 declaration under Section 564(b)(1) of the Act, 21 U. S.C. section 360bbb-3(b)(1), unless the authorization is terminated or revoked sooner.   Performed at [facility], [place] [address], [place], [place] [address]          Radiology Studies: No results found.      Scheduled Meds: . aspirin  81 mg Per Tube Daily  . carvedilol  12.5 mg Per Tube BID WC  . Chlorhexidine Gluconate Cloth  6 each Topical Daily  . clopidogrel  75 mg Per Tube Daily  . enoxaparin (LOVENOX) injection  75 mg Subcutaneous Q12H  . feeding supplement (PROSource TF)  45 mL Per Tube BID  . finasteride  5 mg Oral Daily  . free water  200 mL Per Tube Q6H  . guaiFENesin-dextromethorphan  5 mL Oral Q6H  . insulin aspart  0-9 Units Subcutaneous Q4H  . pravastatin  40 mg Per Tube Daily  . senna-docusate  1 tablet Oral QHS  . warfarin  2.5 mg Oral ONCE-1600  . Warfarin - Pharmacist Dosing Inpatient   Does not apply q1600   Continuous Infusions: . feeding supplement (JEVITY 1.5 CAL/FIBER) 1,000 mL (05/19/20 1857)     LOS: 29 days    Time spent: 25 minutes    Geradine Girt, DO [facility] 05/20/2020, 1:43 PM     Please page though [place] or Epic secure chat:  For [employer], Adult nurse

## 2020-05-20 NOTE — Progress Notes (Signed)
Occupational Therapy Treatment Patient Details Name: John Lambert MRN: [id number] DOB: 08/28/1940 Today's Date: 05/20/2020    History of present illness John Lambert is a 80 y.o. male admitted 12/14 with bil LE weakness and difficulty walking as well as left hip pain. Imaging notable for Numerous scattered punctate acute infarctions in the left  posterior frontal region consistent with micro embolic infarctions. Punctate acute infarction also affecting the splenium of  the corpus callosum.  Pt with L carotid artery stenosis s/p stent on 04/27/20. Post procedure pt with right hemiparesis, aphasia and left gaze with intraprocedural stroke. Code stroke called with CT head (-) for acute findings. Emergent L carotid diagnostic cerebral cath angiogram (-) for acute findings. MRI 12/23 (+) patchy left basal ganglia as well as left frontoparietal cortical and subcortical infarcts. Tested COVID + on 05/13/2020. 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   OT comments  Pt progressing towards established OT goals and demonstrating increased engagement this session. Pt following 75% of simple commands and answering questions with yes/no nodding of head. Pt requiring Max A +2 for repositioning in bed; optimizing upright posture with bed in chair position. Pt performing oral care and washing his face with set up and using LUE. Pt performing self feeding of chocolate pudding (taking 3 small bites) with Min A for holding cup with RUE. Pt calling his family at end of session with Max cues for sequencing; completing 75% of task and then requiring Total A to finish. Continue to recommend dc to SNF and will continue to follow acutely as admitted.    Follow Up Recommendations  SNF    Equipment Recommendations  Other (comment) (Defer to next venue)    Recommendations for Other Services Rehab consult    Precautions / Restrictions Precautions Precautions:  Fall Precaution Comments: PEG, R HEMI, COVID       Mobility Bed Mobility Overal bed mobility: Needs Assistance             General bed mobility comments: Max A to reposition  Transfers                 General transfer comment: Defer for safety    Balance                                           ADL either performed or assessed with clinical judgement   ADL Overall ADL's : Needs assistance/impaired Eating/Feeding: Minimal assistance;Bed level (bed and chair position) Eating/Feeding Details (indicate cue type and reason): Pt holding pudding cup with R hand with Min A to maintain grasp and then scopping with L hand. Taking 3 bites. Following cues for small bites. Grooming: Set up;Bed level (bed in chair position) Grooming Details (indicate cue type and reason): Prior to eating task, pt performing oral care. Pt reaching out with LUE and taking swab to then wash each section of mouth. Following cues to also scrub tongue. Pt washing his face at bed level with bed in chair position. Pt taking wash cloth from therapist with LUE and then washing his face.                               General ADL Comments: Pt with increased engagment this session. performing grooming, eating, exercises, and  dialing phone to call family     Vision   Vision Assessment?: Vision impaired- to be further tested in functional context Additional Comments: hitting phone button directly to left of button   Perception     Praxis      Cognition Arousal/Alertness: Awake/alert Behavior During Therapy: Flat affect Overall Cognitive Status: Impaired/Different from baseline Area of Impairment: Attention;Following commands;Problem solving;Safety/judgement;Awareness;Memory                   Current Attention Level: Sustained Memory: Decreased short-term memory Following Commands: Follows one step commands with increased time;Follows one step commands  inconsistently Safety/Judgement: Decreased awareness of deficits Awareness: Emergent;Intellectual Problem Solving: Slow processing;Decreased initiation;Requires verbal cues;Requires tactile cues General Comments: Pt following 75% of simpel commands. Also answering amjority of questions with yes/no head nods. Agreeable to grooming and eating tasks at bed level. Calling family at end of session. Having pt dial numbers on phone. Pt performing 75% of task and requiring Max cues for each step; perseverating on number 7 at times.        Exercises Exercises: General Upper Extremity;General Lower Extremity General Exercises - Upper Extremity Shoulder Flexion: PROM;Both;10 reps Elbow Flexion: PROM;Right;10 reps;Supine (Bed in chair position) Elbow Extension: PROM;Right;10 reps;Supine (Bed in chair position) Wrist Flexion: PROM;Right;10 reps;Supine (bed in chair position) Wrist Extension: PROM;Right;15 reps;10 reps;20 reps Digit Composite Flexion: PROM;Right;10 reps;Seated Composite Extension: PROM;Right;10 reps;Seated General Exercises - Lower Extremity Short Arc Quad: AROM;Left;5 reps;Supine;Other (comment) (bedi n chair position) Other Exercises Other Exercises: IS with Max cues; pt both blowing and attempting to suck. Very fatigue   Shoulder Instructions       General Comments VSS on RA    Pertinent Vitals/ Pain       Pain Assessment: Faces Faces Pain Scale: Hurts even more Pain Location: generalized with mobility Pain Descriptors / Indicators: Grimacing Pain Intervention(s): Monitored during session;Limited activity within patient's tolerance;Repositioned  Home Living                                          Prior Functioning/Environment              Frequency  Min 2X/week        Progress Toward Goals  OT Goals(current goals can now be found in the care plan section)  Progress towards OT goals: Progressing toward goals  Acute Rehab OT  Goals Patient Stated Goal: none stated OT Goal Formulation: With patient Time For Goal Achievement: 05/27/20 Potential to Achieve Goals: Fair ADL Goals Pt Will Perform Grooming: with min assist;bed level Pt Will Transfer to Toilet: with modified independence;ambulating Additional ADL Goal #1: Patient will tolerate static unsupported sitting at EOB for >5 minute with Min A. Additional ADL Goal #2: Patient will demonstrate functional transfers with use of LRAD and Mod A.  Plan Discharge plan remains appropriate    Co-evaluation                 AM-PAC OT "6 Clicks" Daily Activity     Outcome Measure   Help from another person eating meals?: A Little Help from another person taking care of personal grooming?: A Little Help from another person toileting, which includes using toliet, bedpan, or urinal?: Total Help from another person bathing (including washing, rinsing, drying)?: Total Help from another person to put on and taking off regular upper body clothing?: Total Help from another person  to put on and taking off regular lower body clothing?: Total 6 Click Score: 10    End of Session Equipment Utilized During Treatment: Gait belt  OT Visit Diagnosis: Unsteadiness on feet (R26.81);Other abnormalities of gait and mobility (R26.89);Muscle weakness (generalized) (M62.81);Pain Pain - Right/Left: Right Pain - part of body: Shoulder;Arm;Hand   Activity Tolerance Patient tolerated treatment well   Patient Left in bed;with call bell/phone within reach;with bed alarm set   Nurse Communication Mobility status;Other (comment) (Skin tear at bottom)        Time: 2376-2831 OT Time Calculation (min): 44 min  Charges: OT General Charges $OT Visit: 1 Visit OT Treatments $Self Care/Home Management : 38-52 mins  [place], OTR/L Acute Rehab Pager: [phone] Office: [place] 05/20/2020, 6:17 PM

## 2020-05-20 NOTE — Plan of Care (Signed)
  Problem: Safety: Goal: Ability to remain free from injury will improve Outcome: Progressing   Problem: Pain Managment: Goal: General experience of comfort will improve Outcome: Progressing   Problem: Education: Goal: Knowledge of disease or condition will improve Outcome: Progressing Goal: Knowledge of secondary prevention will improve Outcome: Progressing Goal: Knowledge of patient specific risk factors addressed and post discharge goals established will improve Outcome: Progressing Goal: Individualized Educational Video(s) Outcome: Progressing   Problem: Ischemic Stroke/TIA Tissue Perfusion: Goal: Complications of ischemic stroke/TIA will be minimized Outcome: Progressing

## 2020-05-20 NOTE — TOC Progression Note (Signed)
Transition of Care [facility]) - Progression Note    Patient Details  Name: CHINMAY SQUIER MRN: [id number] Date of Birth: 1940/11/01  Transition of Care [facility]) CM/SW [place], [place] Phone Number: 05/20/2020, 3:58 PM  Clinical Narrative:   CSW following for discharge to SNF when COVID precautions are removed. CSW received call from daughter asking about nursing setting up an iPad to video call with her Android phone. CSW asked charge nurse, and she said that someone is working on [employer], and she will check to make sure they've installed an app to assist with video calls to the family. CSW left a voicemail for daughter with update. CSW also sent request to MD to call daughter with an update today when able. CSW to follow.    Expected Discharge Plan: [place] Barriers to Discharge: SNF Covid,Continued Medical Work up,Insurance Authorization  Expected Discharge Plan and Services Expected Discharge Plan: [place] In-house Referral: Clinical Social Work Discharge Planning Services: CM Consult Post Acute Care Choice: [place] arrangements for the past 2 months: Single Family Home Expected Discharge Date: 05/18/20                                     Social Determinants of Health (SDOH) Interventions    Readmission Risk Interventions No flowsheet data found.

## 2020-05-20 NOTE — Plan of Care (Signed)
Pt did not sleep well last night and requested a sleep aid for tonight. RN passed on in shift change. Problem: Health Behavior/Discharge Planning: Goal: Ability to manage health-related needs will improve Outcome: Progressing   Problem: Clinical Measurements: Goal: Will remain free from infection Outcome: Progressing Goal: Diagnostic test results will improve Outcome: Progressing Goal: Respiratory complications will improve Outcome: Progressing Goal: Cardiovascular complication will be avoided Outcome: Progressing   Problem: Activity: Goal: Risk for activity intolerance will decrease Outcome: Progressing   Problem: Nutrition: Goal: Adequate nutrition will be maintained Outcome: Progressing   Problem: Coping: Goal: Level of anxiety will decrease Outcome: Progressing   Problem: Elimination: Goal: Will not experience complications related to bowel motility Outcome: Progressing Goal: Will not experience complications related to urinary retention Outcome: Progressing   Problem: Pain Managment: Goal: General experience of comfort will improve Outcome: Progressing   Problem: Safety: Goal: Ability to remain free from injury will improve Outcome: Progressing   Problem: Skin Integrity: Goal: Risk for impaired skin integrity will decrease Outcome: Progressing   Problem: Education: Goal: Knowledge of disease or condition will improve Outcome: Progressing Goal: Knowledge of secondary prevention will improve Outcome: Progressing Goal: Knowledge of patient specific risk factors addressed and post discharge goals established will improve Outcome: Progressing Goal: Individualized Educational Video(s) Outcome: Progressing   Problem: Coping: Goal: Will verbalize positive feelings about self Outcome: Progressing Goal: Will identify appropriate support needs Outcome: Progressing   Problem: Health Behavior/Discharge Planning: Goal: Ability to manage health-related needs will  improve Outcome: Progressing   Problem: Self-Care: Goal: Verbalization of feelings and concerns over difficulty with self-care will improve Outcome: Progressing   Problem: Nutrition: Goal: Risk of aspiration will decrease Outcome: Progressing Goal: Dietary intake will improve Outcome: Progressing   Problem: Ischemic Stroke/TIA Tissue Perfusion: Goal: Complications of ischemic stroke/TIA will be minimized Outcome: Progressing

## 2020-05-20 NOTE — Progress Notes (Signed)
[place] for Lovenox / Warfarin Indication: on PTA warfarin for hx of DVT/PE, new acute stroke, new Afib  Patient Measurements: Height: 6\' 2"  (188 cm) Weight: 76.5 kg (168 lb 10.4 oz) IBW/kg (Calculated) : 82.2  Heparin weight: 83.1  Vital Signs: Temp: 99.3 F (37.4 C) (01/14 0329) Temp Source: Axillary (01/14 0329) BP: 104/79 (01/14 0329) Pulse Rate: 102 (01/14 0329)  Labs: Recent Labs    05/18/20 0422 05/19/20 0408 05/20/20 0456  HGB 13.5 14.3  --   HCT 43.3 42.5  --   PLT 269 357  --   LABPROT 15.5* 17.0* 21.9*  INR 1.3* 1.4* 2.0*  CREATININE 0.65 0.76  --     Estimated Creatinine Clearance: 78.4 mL/min (by C-G formula based on SCr of 0.76 mg/dL).  Assessment: 80 yo male with L symptomatic carotid stenosis.  S/p L carotid TCAR procedure w/ stenting that was complicated by postprocedure stroke.  MRI brain shows patchy L basal ganglia and L frontoparietal cortical and subcortical infarcts (new). No hemorrhagic transformation. New stroke and new onset atrial fibrillation. Previous reported hematuria but appears to have resolved.  PTA warfarin dose: 2.5mg  daily except 1.25mg  on Mondays.  Heparin switched to Lovenox 1/10 INR jumped to 2 this morning - required higher dose than PTA to reach   Goal of Therapy:   Appropriate Lovenox dosing INR goal 2-3  Monitor platelets by anticoagulation protocol: Yes    Plan: Lovenox 75 mg sq Q 12 hours - DC in AM if INR therapeutic Warfarin 2.5 mg po x 1 today Daily INR  Thank you Anette Guarneri, PharmD [phone] 05/20/2020 8:44 AM

## 2020-05-21 LAB — BASIC METABOLIC PANEL
Anion gap: 9 (ref 5–15)
BUN: 33 mg/dL — ABNORMAL HIGH (ref 8–23)
CO2: 29 mmol/L (ref 22–32)
Calcium: 8.6 mg/dL — ABNORMAL LOW (ref 8.9–10.3)
Chloride: 97 mmol/L — ABNORMAL LOW (ref 98–111)
Creatinine, Ser: 0.75 mg/dL (ref 0.61–1.24)
GFR, Estimated: >60 mL/min (ref >60)
Glucose, Bld: 113 mg/dL — ABNORMAL HIGH (ref 70–99)
Potassium: 4.2 mmol/L (ref 3.5–5.1)
Sodium: 135 mmol/L (ref 135–145)

## 2020-05-21 LAB — GLUCOSE, CAPILLARY
Glucose-Capillary: 105 mg/dL — ABNORMAL HIGH (ref 70–99)
Glucose-Capillary: 114 mg/dL — ABNORMAL HIGH (ref 70–99)
Glucose-Capillary: 123 mg/dL — ABNORMAL HIGH (ref 70–99)
Glucose-Capillary: 131 mg/dL — ABNORMAL HIGH (ref 70–99)
Glucose-Capillary: 134 mg/dL — ABNORMAL HIGH (ref 70–99)

## 2020-05-21 LAB — CBC
HCT: 41.3 % (ref 39.0–52.0)
Hemoglobin: 12.9 g/dL — ABNORMAL LOW (ref 13.0–17.0)
MCH: 29.3 pg (ref 26.0–34.0)
MCHC: 31.2 g/dL (ref 30.0–36.0)
MCV: 93.7 fL (ref 80.0–100.0)
Platelets: 273 10*3/uL (ref 150–400)
RBC: 4.41 MIL/uL (ref 4.22–5.81)
RDW: 14.3 % (ref 11.5–15.5)
WBC: 6.2 10*3/uL (ref 4.0–10.5)
nRBC: 0 % (ref 0.0–0.2)

## 2020-05-21 LAB — PROTIME-INR
INR: 2.4 — ABNORMAL HIGH (ref 0.8–1.2)
Prothrombin Time: 25.1 seconds — ABNORMAL HIGH (ref 11.4–15.2)

## 2020-05-21 MED ORDER — ACETAMINOPHEN 650 MG RE SUPP: 650.0000 mg | RECTAL | Status: DC | PRN

## 2020-05-21 MED ORDER — ACETAMINOPHEN 160 MG/5ML PO SOLN
650.0000 mg | ORAL | Status: DC | PRN
  Administered 2020-05-23 – 2020-05-24 (×3): 650 mg
  Filled 2020-05-21 (×3): qty 20.3

## 2020-05-21 MED ORDER — GUAIFENESIN-DM 100-10 MG/5ML PO SYRP
5.0000 mL | ORAL_SOLUTION | Freq: Four times a day (QID) | ORAL | Status: DC
  Administered 2020-05-21 – 2020-05-25 (×15): 5 mL
  Filled 2020-05-21 (×13): qty 5

## 2020-05-21 MED ORDER — SENNOSIDES-DOCUSATE SODIUM 8.6-50 MG PO TABS
1.0000 | ORAL_TABLET | Freq: Every day | ORAL | Status: DC
  Administered 2020-05-22 – 2020-05-23 (×2): 1
  Filled 2020-05-21 (×3): qty 1

## 2020-05-21 MED ORDER — WARFARIN SODIUM 2.5 MG PO TABS
2.5000 mg | ORAL_TABLET | Freq: Once | ORAL | Status: AC
  Administered 2020-05-21: 2.5 mg via ORAL
  Filled 2020-05-21: qty 1

## 2020-05-21 MED ORDER — OXYCODONE HCL 5 MG PO TABS: 5.0000 mg | ORAL_TABLET | Freq: Four times a day (QID) | ORAL | Status: DC | PRN

## 2020-05-21 MED ORDER — ACETAMINOPHEN 325 MG PO TABS: 650.0000 mg | ORAL_TABLET | ORAL | Status: DC | PRN

## 2020-05-21 NOTE — Progress Notes (Signed)
[place] for Lovenox / Warfarin Indication: on PTA warfarin for hx of DVT/PE, new acute stroke, new Afib  Patient Measurements: Height: 6\' 2"  (188 cm) Weight: 74.1 kg (163 lb 5.8 oz) IBW/kg (Calculated) : 82.2  Heparin weight: 83.1  Vital Signs: Temp: 97.6 F (36.4 C) (01/15 0449) Temp Source: Oral (01/15 0449) BP: 112/71 (01/15 0449) Pulse Rate: 100 (01/15 0449)  Labs: Recent Labs    05/19/20 0408 05/20/20 0456 05/21/20 0328  HGB 14.3  --  12.9*  HCT 42.5  --  41.3  PLT 357  --  273  LABPROT 17.0* 21.9* 25.1*  INR 1.4* 2.0* 2.4*  CREATININE 0.76  --  0.75    Estimated Creatinine Clearance: 75.9 mL/min (by C-G formula based on SCr of 0.75 mg/dL).  Assessment: 80 yo male with L symptomatic carotid stenosis.  S/p L carotid TCAR procedure w/ stenting that was complicated by postprocedure stroke.  MRI brain shows patchy L basal ganglia and L frontoparietal cortical and subcortical infarcts (new). No hemorrhagic transformation. New stroke and new onset atrial fibrillation. Previous reported hematuria but appears to have resolved.  PTA warfarin dose: 2.5mg  daily except 1.25mg  on Mondays.  Heparin switched to Lovenox 1/10 INR remains >2 today.  Will d/c Lovenox.  Goal of Therapy:   INR goal 2-3  Monitor platelets by anticoagulation protocol: Yes    Plan: D/C Lovenox Warfarin 2.5 mg po x 1 today Daily INR  [employer], Pharm.D., BCPS Clinical Pharmacist  **Pharmacist phone directory can be found on [email or web address] listed under [place].  05/21/2020 8:45 AM

## 2020-05-21 NOTE — Progress Notes (Signed)
PROGRESS NOTE  John Lambert  DOB: [birth year]  PCP: John Stains, MD OVF:[id number]  DOA: 04/19/2020  LOS: 30 days   Chief Complaint  Patient presents with  . Leg Pain  . Weakness   Brief narrative: John Lambert is a 80 y.o. 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 Patient presented to the ED on 12/14 with weakness, he was found to have scattered punctate left frontal cerebral infarctions. He was admitted for stroke work-up.  Found to have 80 to 99% stenosis of left ICA. 12/22, patient had left TCAR done after which she developed aphasia, left gaze deviation and right hemiparesis.  Code stroke was called in PACU.  He underwent emergent left cerebral angiogram but did not show any findings. Since then, he has had neurological deficits including dysphagia, right hemiparesis and right hemineglect. His hospital course was also complicated by UTI, urinary retention, hematuria, dysphagia requiring PEG tube placement and incidental nosocomial COVID-19 infection. Because of persistent dysphagia, patient underwent PEG tube placement by IR on 1/6. 1/7, incidental COVID. As of now, patient is waiting for SNF placement after COVID isolation duration of 10 days is over.  Subjective: Patient was seen and examined this morning.   Elderly male.  Propped up in bed.  Right hemiparesis with aphasia noted.  Moves his left extremities when asked to move his right extremities. No family at bedside.  Assessment/Plan: Acute CVA Left carotid stenosis. -Presented with weakness of lower extremities for 24 hours, impaired ambulation -MRI findings as above showing left carotid atherosclerosis. -S/p left TCAR by Dr. Trula Slade on 12/22.  Post TCAR patient underwent recurrent acute embolic CVA with right hemiparesis, aphasia and right hemineglect. -Currently on aspirin, Plavix and Coumadin. -Plan is to stop Plavix on  1/22 to continue aspirin and Coumadin. -Continue statin indefinitely.  COVID-19 infection -Mild symptoms.  Not on supplemental oxygen.  Chest x-ray clear.  Continue supportive care. -Plan to DC isolation precautions on 1/17 and discharged to SNF.  New diagnosis of paroxysmal A. Fib -Continue Coreg.  Already on Coumadin.  Essential hypertension -Home meds include Coreg and lisinopril. Continue Coreg at a low dose.  Lisinopril remains on hold. -Continue to monitor.  Blood clotting disorder, history of DVT/PE -On lifelong Coumadin. -Ultrasound duplex of lower extremities showed age-indeterminate, probably chronic DVT without symptoms. Recent Labs  Lab 05/17/20 0508 05/18/20 0422 05/19/20 0408 05/20/20 0456 05/21/20 0328  INR 1.2 1.3* 1.4* 2.0* 2.4*   Left thigh pain -Shooting pain, intermittent.  Continue Flexeril nightly as needed.  Hematuria Klebsiella UTI -Antibiotic course completed.  Urine clear.  Acute renal retention BPH -Currently has an indwelling Foley catheter.  Urology outpatient follow-up.  Continue finasteride.  Dysphagia -Status post PEG tube placement by IR on 1/6.  Continue tube feeding.  AKI -Resolved Recent Labs    05/09/20 0957 05/10/20 0603 05/11/20 8416 05/12/20 0311 05/13/20 0204 05/14/20 0453 05/15/20 0246 05/18/20 0422 05/19/20 0408 05/21/20 0328  BUN 32* 31* 29* 26* 26* 25* 24* 24* 28* 33*  CREATININE 0.77 0.80 0.79 0.78 0.77 0.74 0.66 0.65 0.76 0.75    Mobility: PT eval obtained.  CIR recommended Code Status:   Code Status: Full Code  Nutritional status: Body mass index is 20.97 kg/m. Nutrition Problem: Inadequate oral intake Etiology: dysphagia Signs/Symptoms: NPO status Diet Order    None      DVT prophylaxis: warfarin  Antimicrobials:  None  Fluid: None Consultants: Neurology, vascular surgery  Family Communication:  Family not at bedside  Status is: Inpatient  Dispo: The patient is from: Home               Anticipated d/c is to: SNF              Anticipated d/c date is: Can be discharged to skilled nursing facility once isolation is cleared.              Patient currently is medically stable to d/c.  Infusions:  . feeding supplement (JEVITY 1.5 CAL/FIBER) 1,000 mL (05/21/20 1036)    Scheduled Meds: . aspirin  81 mg Per Tube Daily  . carvedilol  6.25 mg Per Tube BID WC  . Chlorhexidine Gluconate Cloth  6 each Topical Daily  . clopidogrel  75 mg Per Tube Daily  . feeding supplement (PROSource TF)  45 mL Per Tube BID  . finasteride  5 mg Oral Daily  . free water  200 mL Per Tube Q6H  . guaiFENesin-dextromethorphan  5 mL Oral Q6H  . pravastatin  40 mg Per Tube Daily  . senna-docusate  1 tablet Oral QHS  . warfarin  2.5 mg Oral ONCE-1600  . Warfarin - Pharmacist Dosing Inpatient   Does not apply q1600    Antimicrobials: Anti-infectives (From admission, onward)   Start     Dose/Rate Route Frequency Ordered Stop   05/12/20 0900  ceFAZolin (ANCEF) IVPB 2g/100 mL premix        2 g 200 mL/hr over 30 Minutes Intravenous On call 05/12/20 0831 05/12/20 1104   05/11/20 0600  cephALEXin (KEFLEX) 250 MG/5ML suspension 500 mg        500 mg Per Tube Every 8 hours 05/10/20 1036 05/13/20 2102   05/04/20 0945  azithromycin (ZITHROMAX) 500 mg in sodium chloride 0.9 % 250 mL IVPB  Status:  Discontinued        500 mg 250 mL/hr over 60 Minutes Intravenous Every 24 hours 05/04/20 0859 05/05/20 0956   05/04/20 0900  cefTRIAXone (ROCEPHIN) 1 g in sodium chloride 0.9 % 100 mL IVPB  Status:  Discontinued        1 g 200 mL/hr over 30 Minutes Intravenous Every 24 hours 05/04/20 0803 05/10/20 1036   04/27/20 0600  ceFAZolin (ANCEF) IVPB 2g/100 mL premix  Status:  Discontinued       Note to Pharmacy: Send with pt to OR   2 g 200 mL/hr over 30 Minutes Intravenous On call 04/26/20 0805 04/26/20 2018   04/27/20 0600  ceFAZolin (ANCEF) IVPB 2g/100 mL premix        2 g 200 mL/hr over 30 Minutes Intravenous 30 min  pre-op 04/26/20 2013 04/27/20 0804   04/27/20 0000  ceFAZolin (ANCEF) IVPB 1 g/50 mL premix  Status:  Discontinued       Note to Pharmacy: Send with pt to OR   1 g 100 mL/hr over 30 Minutes Intravenous On call 04/26/20 0800 04/26/20 0805      PRN meds: acetaminophen **OR** acetaminophen (TYLENOL) oral liquid 160 mg/5 mL **OR** acetaminophen, benzonatate, cyclobenzaprine, ipratropium, levalbuterol, oxyCODONE   Objective: Vitals:   05/21/20 0449 05/21/20 0914  BP: 112/71 124/80  Pulse: 100 (!) 104  Resp: 20 20  Temp: 97.6 F (36.4 C) 97.7 F (36.5 C)  SpO2: 97% 98%    Intake/Output Summary (Last 24 hours) at 05/21/2020 1348 Last data filed at 05/21/2020 0657 Gross per 24 hour  Intake 660 ml  Output 795 ml  Net -  135 ml   Filed Weights   05/13/20 0256 05/18/20 0500 05/21/20 0500  Weight: 79.3 kg 76.5 kg 74.1 kg   Weight change:  Body mass index is 20.97 kg/m.   Physical Exam: General exam: Elderly Caucasian male.  Looks depressed.  He has significant deficits from stroke. Skin: No rashes, lesions or ulcers. HEENT: Atraumatic, normocephalic, no obvious bleeding Lungs: Clear to auscultation bilaterally CVS: Regular rate and rhythm, no murmur GI/Abd soft, nontender, nondistended, bowel sound present CNS: Alert, awake, unable to communicate or verbalize.   Psychiatry: Mood appropriate Extremities: No pedal edema, no calf tenderness.  Data Review: I have personally reviewed the laboratory data and studies available.  Recent Labs  Lab 05/15/20 0246 05/16/20 0430 05/18/20 0422 05/19/20 0408 05/21/20 0328  WBC 5.2 4.8 6.1 9.1 6.2  HGB 11.1* 12.1* 13.5 14.3 12.9*  HCT 33.8* 37.7* 43.3 42.5 41.3  MCV 93.1 94.3 95.0 91.0 93.7  PLT 389 407* 269 357 273   Recent Labs  Lab 05/15/20 0246 05/18/20 0422 05/19/20 0408 05/21/20 0328  NA 135 135 135 135  K 4.2 4.7 4.6 4.2  CL 103 101 98 97*  CO2 24 23 26 29   GLUCOSE 125* 131* 118* 113*  BUN 24* 24* 28* 33*   CREATININE 0.66 0.65 0.76 0.75  CALCIUM 8.1* 9.0 9.0 8.6*    F/u labs ordered.  Signed, Terrilee Croak, MD [facility] 05/21/2020

## 2020-05-22 LAB — GLUCOSE, CAPILLARY
Glucose-Capillary: 102 mg/dL — ABNORMAL HIGH (ref 70–99)
Glucose-Capillary: 116 mg/dL — ABNORMAL HIGH (ref 70–99)
Glucose-Capillary: 117 mg/dL — ABNORMAL HIGH (ref 70–99)
Glucose-Capillary: 118 mg/dL — ABNORMAL HIGH (ref 70–99)
Glucose-Capillary: 118 mg/dL — ABNORMAL HIGH (ref 70–99)
Glucose-Capillary: 130 mg/dL — ABNORMAL HIGH (ref 70–99)
Glucose-Capillary: 140 mg/dL — ABNORMAL HIGH (ref 70–99)

## 2020-05-22 LAB — PROTIME-INR
INR: 1.8 — ABNORMAL HIGH (ref 0.8–1.2)
Prothrombin Time: 20.2 seconds — ABNORMAL HIGH (ref 11.4–15.2)

## 2020-05-22 MED ORDER — WARFARIN SODIUM 5 MG PO TABS: 5.0000 mg | ORAL_TABLET | Freq: Once | ORAL | Status: DC

## 2020-05-22 MED ORDER — WARFARIN SODIUM 5 MG PO TABS
5.0000 mg | ORAL_TABLET | Freq: Once | ORAL | Status: AC
  Administered 2020-05-22: 5 mg
  Filled 2020-05-22: qty 1

## 2020-05-22 MED ORDER — ENOXAPARIN SODIUM 80 MG/0.8ML ~~LOC~~ SOLN
1.0000 mg/kg | Freq: Two times a day (BID) | SUBCUTANEOUS | Status: DC
  Administered 2020-05-22 – 2020-05-25 (×7): 75 mg via SUBCUTANEOUS
  Filled 2020-05-22 (×7): qty 0.8

## 2020-05-22 NOTE — Progress Notes (Signed)
[place] for Lovenox / Warfarin Indication: on PTA warfarin for hx of DVT/PE, new acute stroke, new Afib  Patient Measurements: Height: 6\' 2"  (188 cm) Weight: 74.2 kg (163 lb 9.3 oz) IBW/kg (Calculated) : 82.2  Heparin weight: 83.1  Vital Signs: Temp: 98.6 F (37 C) (01/16 0449) Temp Source: Axillary (01/16 0449) BP: 115/79 (01/16 0449) Pulse Rate: 99 (01/16 0449)  Labs: Recent Labs    05/20/20 0456 05/21/20 0328 05/22/20 0328  HGB  --  12.9*  --   HCT  --  41.3  --   PLT  --  273  --   LABPROT 21.9* 25.1* 20.2*  INR 2.0* 2.4* 1.8*  CREATININE  --  0.75  --     Estimated Creatinine Clearance: 76 mL/min (by C-G formula based on SCr of 0.75 mg/dL).  Assessment: 80 yo male with L symptomatic carotid stenosis.  S/p L carotid TCAR procedure w/ stenting that was complicated by postprocedure stroke.  MRI brain shows patchy L basal ganglia and L frontoparietal cortical and subcortical infarcts (new). No hemorrhagic transformation. New stroke and new onset atrial fibrillation. Previous reported hematuria but appears to have resolved.  PTA warfarin dose: 2.5mg  daily except 1.25mg  on Mondays.  INR now subtherapeutic at 1.8 which is surprising considering patient was on PTA dose of warfarin.  Will resume Lovenox until INR therapeutic.  Goal of Therapy:   INR goal 2-3  Monitor platelets by anticoagulation protocol: Yes    Plan: Increase Warfarin 5 mg po x 1 today Lovenox 75mg  SQ q12h Daily INR  [employer], Pharm.D., BCPS Clinical Pharmacist  **Pharmacist phone directory can be found on [email or web address] listed under [place].  05/22/2020 7:35 AM

## 2020-05-22 NOTE — Progress Notes (Signed)
PROGRESS NOTE  John Lambert  DOB: 1940-11-25  PCP: Harlan Stains, MD DC:[id number]  DOA: 04/19/2020  LOS: 31 days   Chief Complaint  Patient presents with  . Leg Pain  . Weakness   Brief narrative: John Lambert is a 80 y.o. 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 Patient presented to the ED on 12/14 with weakness, he was found to have scattered punctate left frontal cerebral infarctions. He was admitted for stroke work-up.  Found to have 80 to 99% stenosis of left ICA. 12/22, patient had left TCAR done after which she developed aphasia, left gaze deviation and right hemiparesis.  Code stroke was called in PACU.  He underwent emergent left cerebral angiogram but did not show any findings. Since then, he has had neurological deficits including dysphagia, right hemiparesis and right hemineglect. His hospital course was also complicated by UTI, urinary retention, hematuria, dysphagia requiring PEG tube placement and incidental nosocomial COVID-19 infection. Because of persistent dysphagia, patient underwent PEG tube placement by IR on 1/6. 1/7, incidental COVID. As of now, patient is waiting for SNF placement after COVID isolation duration of 10 days is over.  Subjective: Patient was seen and examined this morning.   Propped up in bed.  Not in distress. Right hemiparesis with aphasia noted.  Moves his left extremities when asked to move his right extremities. No family at bedside.  Assessment/Plan: Acute CVA Left carotid stenosis. -Presented with weakness of lower extremities for 24 hours, impaired ambulation -MRI findings as above showing left carotid atherosclerosis. -S/p left TCAR by Dr. Trula Slade on 12/22.  Post TCAR patient underwent recurrent acute embolic CVA with right hemiparesis, aphasia and right hemineglect. -Currently on aspirin, Plavix and Coumadin. -Plan is to stop Plavix on  1/22 to continue aspirin and Coumadin. -Continue statin indefinitely.  COVID-19 infection -Mild symptoms.  Not on supplemental oxygen.  Chest x-ray clear.  Continue supportive care. -Plan to DC isolation precautions on 1/17 and discharge to SNF.  New diagnosis of paroxysmal A. Fib -Continue Coreg.  Already on Coumadin and Lovenox bridge.  Essential hypertension -Home meds include Coreg and lisinopril. Continue Coreg at a low dose.  Lisinopril remains on hold. -Continue to monitor.  Blood clotting disorder, history of DVT/PE -On lifelong Coumadin. -Ultrasound duplex of lower extremities showed age-indeterminate, probably chronic DVT without symptoms. -INR subtherapeutic at 1.8 this morning.  Lovenox bridge added.  Continue to monitor INR. Recent Labs  Lab 05/18/20 0422 05/19/20 0408 05/20/20 0456 05/21/20 0328 05/22/20 0328  INR 1.3* 1.4* 2.0* 2.4* 1.8*   Left thigh pain -Shooting pain, intermittent.  Continue Flexeril nightly as needed.  Hematuria Klebsiella UTI -Antibiotic course completed.  Urine clear.  Acute renal retention BPH -Currently has an indwelling Foley catheter.  Urology outpatient follow-up.  Continue finasteride.  Dysphagia -Status post PEG tube placement by IR on 1/6.  Continue tube feeding.  AKI -Resolved Recent Labs    05/09/20 0957 05/10/20 0603 05/11/20 [id number] 05/12/20 0311 05/13/20 0204 05/14/20 0453 05/15/20 0246 05/18/20 0422 05/19/20 0408 05/21/20 0328  BUN 32* 31* 29* 26* 26* 25* 24* 24* 28* 33*  CREATININE 0.77 0.80 0.79 0.78 0.77 0.74 0.66 0.65 0.76 0.75    Mobility: PT eval obtained.  CIR recommended Code Status:   Code Status: Full Code  Nutritional status: Body mass index is 21 kg/m. Nutrition Problem: Inadequate oral intake Etiology: dysphagia Signs/Symptoms: NPO status Diet Order    None  DVT prophylaxis: warfarin  Antimicrobials:  None  Fluid: None Consultants: Neurology, vascular surgery Family  Communication:  Family not at bedside  Status is: Inpatient  Dispo: The patient is from: Home              Anticipated d/c is to: SNF              Anticipated d/c date is: Can be discharged to skilled nursing facility once isolation is cleared.              Patient currently is medically stable to d/c.  Infusions:  . feeding supplement (JEVITY 1.5 CAL/FIBER) 1,000 mL (05/22/20 1134)    Scheduled Meds: . aspirin  81 mg Per Tube Daily  . carvedilol  6.25 mg Per Tube BID WC  . Chlorhexidine Gluconate Cloth  6 each Topical Daily  . clopidogrel  75 mg Per Tube Daily  . enoxaparin (LOVENOX) injection  1 mg/kg Subcutaneous Q12H  . feeding supplement (PROSource TF)  45 mL Per Tube BID  . finasteride  5 mg Oral Daily  . free water  200 mL Per Tube Q6H  . guaiFENesin-dextromethorphan  5 mL Per Tube Q6H  . pravastatin  40 mg Per Tube Daily  . senna-docusate  1 tablet Per Tube QHS  . warfarin  5 mg Per Tube ONCE-1600  . Warfarin - Pharmacist Dosing Inpatient   Does not apply q1600    Antimicrobials: Anti-infectives (From admission, onward)   Start     Dose/Rate Route Frequency Ordered Stop   05/12/20 0900  ceFAZolin (ANCEF) IVPB 2g/100 mL premix        2 g 200 mL/hr over 30 Minutes Intravenous On call 05/12/20 0831 05/12/20 1104   05/11/20 0600  cephALEXin (KEFLEX) 250 MG/5ML suspension 500 mg        500 mg Per Tube Every 8 hours 05/10/20 1036 05/13/20 2102   05/04/20 0945  azithromycin (ZITHROMAX) 500 mg in sodium chloride 0.9 % 250 mL IVPB  Status:  Discontinued        500 mg 250 mL/hr over 60 Minutes Intravenous Every 24 hours 05/04/20 0859 05/05/20 0956   05/04/20 0900  cefTRIAXone (ROCEPHIN) 1 g in sodium chloride 0.9 % 100 mL IVPB  Status:  Discontinued        1 g 200 mL/hr over 30 Minutes Intravenous Every 24 hours 05/04/20 0803 05/10/20 1036   04/27/20 0600  ceFAZolin (ANCEF) IVPB 2g/100 mL premix  Status:  Discontinued       Note to Pharmacy: Send with pt to OR   2 g 200  mL/hr over 30 Minutes Intravenous On call 04/26/20 0805 04/26/20 2018   04/27/20 0600  ceFAZolin (ANCEF) IVPB 2g/100 mL premix        2 g 200 mL/hr over 30 Minutes Intravenous 30 min pre-op 04/26/20 2013 04/27/20 0804   04/27/20 0000  ceFAZolin (ANCEF) IVPB 1 g/50 mL premix  Status:  Discontinued       Note to Pharmacy: Send with pt to OR   1 g 100 mL/hr over 30 Minutes Intravenous On call 04/26/20 0800 04/26/20 0805      PRN meds: acetaminophen **OR** acetaminophen (TYLENOL) oral liquid 160 mg/5 mL **OR** acetaminophen, benzonatate, cyclobenzaprine, ipratropium, levalbuterol, oxyCODONE   Objective: Vitals:   05/22/20 0449 05/22/20 1132  BP: 115/79 109/73  Pulse: 99 96  Resp: 18 18  Temp: 98.6 F (37 C) 98.2 F (36.8 C)  SpO2: 96% 97%    Intake/Output Summary (  Last 24 hours) at 05/22/2020 1345 Last data filed at 05/21/2020 1812 Gross per 24 hour  Intake 780 ml  Output 575 ml  Net 205 ml   Filed Weights   05/18/20 0500 05/21/20 0500 05/22/20 0500  Weight: 76.5 kg 74.1 kg 74.2 kg   Weight change: 0.1 kg Body mass index is 21 kg/m.   Physical Exam: General exam: Elderly Caucasian male.  Looks depressed.  He has significant deficits from stroke. Skin: No rashes, lesions or ulcers. HEENT: Atraumatic, normocephalic, no obvious bleeding Lungs: Clear to auscultation bilaterally CVS: Regular rate and rhythm, no murmur GI/Abd soft, nontender, nondistended, bowel sound present CNS: Alert, awake, unable to communicate or verbalize.  Able to move left extremities.  Right hemiparesis and right hemineglect. Psychiatry: Mood appropriate Extremities: No pedal edema, no calf tenderness.  Data Review: I have personally reviewed the laboratory data and studies available.  Recent Labs  Lab 05/16/20 0430 05/18/20 0422 05/19/20 0408 05/21/20 0328  WBC 4.8 6.1 9.1 6.2  HGB 12.1* 13.5 14.3 12.9*  HCT 37.7* 43.3 42.5 41.3  MCV 94.3 95.0 91.0 93.7  PLT 407* 269 357 273   Recent  Labs  Lab 05/18/20 0422 05/19/20 0408 05/21/20 0328  NA 135 135 135  K 4.7 4.6 4.2  CL 101 98 97*  CO2 23 26 29   GLUCOSE 131* 118* 113*  BUN 24* 28* 33*  CREATININE 0.65 0.76 0.75  CALCIUM 9.0 9.0 8.6*    F/u labs ordered.  Signed, Terrilee Croak, MD [facility] 05/22/2020

## 2020-05-23 LAB — GLUCOSE, CAPILLARY
Glucose-Capillary: 112 mg/dL — ABNORMAL HIGH (ref 70–99)
Glucose-Capillary: 133 mg/dL — ABNORMAL HIGH (ref 70–99)
Glucose-Capillary: 114 mg/dL — ABNORMAL HIGH (ref 70–99)
Glucose-Capillary: 117 mg/dL — ABNORMAL HIGH (ref 70–99)

## 2020-05-23 LAB — PROTIME-INR
INR: 1.7 — ABNORMAL HIGH (ref 0.8–1.2)
Prothrombin Time: 19 seconds — ABNORMAL HIGH (ref 11.4–15.2)

## 2020-05-23 MED ORDER — WARFARIN SODIUM 6 MG PO TABS
6.0000 mg | ORAL_TABLET | Freq: Once | ORAL | Status: AC
  Administered 2020-05-23: 6 mg
  Filled 2020-05-23: qty 1

## 2020-05-23 NOTE — Progress Notes (Signed)
PROGRESS NOTE  John Lambert  DOB: [birth year]  PCP: Harlan Stains, MD PXT:[id number]  DOA: 04/19/2020  LOS: 32 days   Chief Complaint  Patient presents with  . Leg Pain  . Weakness   Brief narrative: John Lambert is a 80 y.o. 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 Patient presented to the ED on 12/14 with weakness, he was found to have scattered punctate left frontal cerebral infarctions. He was admitted for stroke work-up.  Found to have 80 to 99% stenosis of left ICA. 12/22, patient had left TCAR done after which she developed aphasia, left gaze deviation and right hemiparesis.  Code stroke was called in PACU.  He underwent emergent left cerebral angiogram but did not show any findings. Since then, he has had neurological deficits including dysphagia, right hemiparesis and right hemineglect. His hospital course was also complicated by UTI, urinary retention, hematuria, dysphagia requiring PEG tube placement and incidental nosocomial COVID-19 infection. Because of persistent dysphagia, patient underwent PEG tube placement by IR on 1/6. 1/7, incidental COVID. As of now, patient is waiting for SNF placement after COVID isolation duration of 10 days is over.  Subjective: Patient was seen and examined this morning.   Propped up in bed.  Not in distress. Has persistent right hemiparesis, aphasia.  Watching TV.  Able to follow commands when asked to move left extremities.  Assessment/Plan: Acute CVA Left carotid stenosis. -Presented with weakness of lower extremities for 24 hours, impaired ambulation -MRI findings as above showing left carotid atherosclerosis. -S/p left TCAR by Dr. Trula Slade on 12/22.  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 -Currently on aspirin, Plavix and Coumadin. -Plan is to stop Plavix on 1/22 to  continue aspirin and Coumadin. -Continue statin indefinitely.  COVID-19 infection -Patient only had mild symptoms.  Not on supplemental oxygen.  Chest x-ray clear.  Continue supportive care. -Plan to DC isolation precautions on 1/17 and discharge to SNF.  Essential hypertension -Home meds include Coreg and lisinopril. Continue Coreg at a low dose.  Lisinopril remains on hold. -Continue to monitor.  New diagnosis of paroxysmal A. Fib -Continue Coreg.  Already on Coumadin.  Currently also on Lovenox bridge because INR is less than 2. Recent Labs  Lab 05/19/20 0408 05/20/20 0456 05/21/20 0328 05/22/20 0328 05/23/20 0022  INR 1.4* 2.0* 2.4* 1.8* 1.7*   Blood clotting disorder, history of DVT/PE -On lifelong Coumadin. -Ultrasound duplex of lower extremities showed age-indeterminate, probably chronic DVT without symptoms.  Left thigh pain -Shooting pain, intermittent.  Continue Flexeril nightly as needed.  Hematuria Klebsiella UTI -Antibiotic course completed.  Urine clear.  Acute renal retention BPH -Currently has an indwelling Foley catheter.  Urology outpatient follow-up.  Continue finasteride.  Dysphagia -Status post PEG tube placement by IR on 1/6.  Continue tube feeding.  AKI -Resolved Recent Labs    05/09/20 0957 05/10/20 0603 05/11/20 6270 05/12/20 0311 05/13/20 0204 05/14/20 0453 05/15/20 0246 05/18/20 0422 05/19/20 0408 05/21/20 0328  BUN 32* 31* 29* 26* 26* 25* 24* 24* 28* 33*  CREATININE 0.77 0.80 0.79 0.78 0.77 0.74 0.66 0.65 0.76 0.75    Mobility: PT eval obtained.  CIR recommended Code Status:   Code Status: Full Code  Nutritional status: Body mass index is 20.83 kg/m. Nutrition Problem: Inadequate oral intake Etiology: dysphagia Signs/Symptoms: NPO status Diet Order    None      DVT  prophylaxis: warfarin  Antimicrobials:  None  Fluid: None Consultants: Neurology, vascular surgery Family Communication:  I called and updated patient's  daughter and wife on the phone.  Status is: Inpatient  Dispo: The patient is from: Home              Anticipated d/c is to: SNF              Anticipated d/c date is: Can be discharged to skilled nursing facility once isolation is cleared.  Social worker aware.              Patient currently is medically stable to d/c.  Infusions:  . feeding supplement (JEVITY 1.5 CAL/FIBER) 1,000 mL (05/22/20 1134)    Scheduled Meds: . aspirin  81 mg Per Tube Daily  . carvedilol  6.25 mg Per Tube BID WC  . Chlorhexidine Gluconate Cloth  6 each Topical Daily  . clopidogrel  75 mg Per Tube Daily  . enoxaparin (LOVENOX) injection  1 mg/kg Subcutaneous Q12H  . feeding supplement (PROSource TF)  45 mL Per Tube BID  . finasteride  5 mg Oral Daily  . free water  200 mL Per Tube Q6H  . guaiFENesin-dextromethorphan  5 mL Per Tube Q6H  . pravastatin  40 mg Per Tube Daily  . senna-docusate  1 tablet Per Tube QHS  . warfarin  6 mg Per Tube ONCE-1600  . Warfarin - Pharmacist Dosing Inpatient   Does not apply q1600    Antimicrobials: Anti-infectives (From admission, onward)   Start     Dose/Rate Route Frequency Ordered Stop   05/12/20 0900  ceFAZolin (ANCEF) IVPB 2g/100 mL premix        2 g 200 mL/hr over 30 Minutes Intravenous On call 05/12/20 0831 05/12/20 1104   05/11/20 0600  cephALEXin (KEFLEX) 250 MG/5ML suspension 500 mg        500 mg Per Tube Every 8 hours 05/10/20 1036 05/13/20 2102   05/04/20 0945  azithromycin (ZITHROMAX) 500 mg in sodium chloride 0.9 % 250 mL IVPB  Status:  Discontinued        500 mg 250 mL/hr over 60 Minutes Intravenous Every 24 hours 05/04/20 0859 05/05/20 0956   05/04/20 0900  cefTRIAXone (ROCEPHIN) 1 g in sodium chloride 0.9 % 100 mL IVPB  Status:  Discontinued        1 g 200 mL/hr over 30 Minutes Intravenous Every 24 hours 05/04/20 0803 05/10/20 1036   04/27/20 0600  ceFAZolin (ANCEF) IVPB 2g/100 mL premix  Status:  Discontinued       Note to Pharmacy: Send with pt to  OR   2 g 200 mL/hr over 30 Minutes Intravenous On call 04/26/20 0805 04/26/20 2018   04/27/20 0600  ceFAZolin (ANCEF) IVPB 2g/100 mL premix        2 g 200 mL/hr over 30 Minutes Intravenous 30 min pre-op 04/26/20 2013 04/27/20 0804   04/27/20 0000  ceFAZolin (ANCEF) IVPB 1 g/50 mL premix  Status:  Discontinued       Note to Pharmacy: Send with pt to OR   1 g 100 mL/hr over 30 Minutes Intravenous On call 04/26/20 0800 04/26/20 0805      PRN meds: acetaminophen **OR** acetaminophen (TYLENOL) oral liquid 160 mg/5 mL **OR** acetaminophen, benzonatate, cyclobenzaprine, ipratropium, levalbuterol, oxyCODONE   Objective: Vitals:   05/23/20 0800 05/23/20 1000  BP: 98/61 90/68  Pulse: (!) 105 90  Resp: 16 16  Temp: 98.7 F (37.1 C) 98.7 F (  37.1 C)  SpO2: 94% 95%    Intake/Output Summary (Last 24 hours) at 05/23/2020 1109 Last data filed at 05/23/2020 0800 Gross per 24 hour  Intake 300 ml  Output 1300 ml  Net -1000 ml   Filed Weights   05/21/20 0500 05/22/20 0500 05/23/20 0500  Weight: 74.1 kg 74.2 kg 73.6 kg   Weight change: -0.6 kg Body mass index is 20.83 kg/m.   Physical Exam: General exam: Elderly Caucasian male.  Not in physical distress. Skin: No rashes, lesions or ulcers. HEENT: Atraumatic, normocephalic, no obvious bleeding Lungs: Clear to auscultation bilaterally. CVS: Regular rate and rhythm, no murmur GI/Abd soft, nontender, nondistended, bowel sound present CNS: Alert, awake, unable to communicate or verbalize.  Able to move left extremities.  Right hemiparesis.  Seems to have right hemineglect as well. Psychiatry: Depressed look Extremities: No pedal edema, no calf tenderness.  Data Review: I have personally reviewed the laboratory data and studies available.  Recent Labs  Lab 05/18/20 0422 05/19/20 0408 05/21/20 0328  WBC 6.1 9.1 6.2  HGB 13.5 14.3 12.9*  HCT 43.3 42.5 41.3  MCV 95.0 91.0 93.7  PLT 269 357 273   Recent Labs  Lab 05/18/20 0422  05/19/20 0408 05/21/20 0328  NA 135 135 135  K 4.7 4.6 4.2  CL 101 98 97*  CO2 23 26 29   GLUCOSE 131* 118* 113*  BUN 24* 28* 33*  CREATININE 0.65 0.76 0.75  CALCIUM 9.0 9.0 8.6*    F/u labs ordered.  Signed, Terrilee Croak, MD [facility] 05/23/2020

## 2020-05-23 NOTE — Progress Notes (Addendum)
This chaplain responded to PMT referral for Pt. communication with Pt. family through [place].    The chaplain communicated with the Pt. RN-Raven before calling to the Pt. daughter-Hope.  The chaplain understands the Pt. family is anticipating discharge to SNF today or tomorrow and is unsure if  a Pt. visit will occur today because of the weather.  The chaplain will proceed with facilitating a ZOOM call today with the Pt. family permission.  *1330  This chaplain was at the Pt. bedside with full PPE to facilitate a ZOOM call with daughter-Hope and wife-Harriet.    The Pt. was able to squeeze the chaplain's hand with his left hand in response to the family's questions.  Otherwise the Pt. did not respond verbally to the family's requests.  The Pt. family was grateful for the opportunity to see the Pt.  The chaplain is available for F/U spiritual care and grateful for RN-Raven's assistance.

## 2020-05-23 NOTE — Progress Notes (Signed)
[place] for Lovenox / Warfarin Indication: on PTA warfarin for hx of DVT/PE, new acute stroke, new Afib  Patient Measurements: Height: 6\' 2"  (188 cm) Weight: 73.6 kg (162 lb 4.1 oz) IBW/kg (Calculated) : 82.2  Heparin weight: 83.1  Vital Signs: Temp: 98.7 F (37.1 C) (01/17 0800) Temp Source: Axillary (01/17 0800) BP: 98/61 (01/17 0800) Pulse Rate: 105 (01/17 0800)  Labs: Recent Labs    05/21/20 0328 05/22/20 0328 05/23/20 0022  HGB 12.9*  --   --   HCT 41.3  --   --   PLT 273  --   --   LABPROT 25.1* 20.2* 19.0*  INR 2.4* 1.8* 1.7*  CREATININE 0.75  --   --     Estimated Creatinine Clearance: 75.4 mL/min (by C-G formula based on SCr of 0.75 mg/dL).  Assessment: 80 yo male with L symptomatic carotid stenosis.  S/p L carotid TCAR procedure w/ stenting that was complicated by postprocedure stroke.  MRI brain shows patchy L basal ganglia and L frontoparietal cortical and subcortical infarcts (new). No hemorrhagic transformation. New stroke and new onset atrial fibrillation. Previous reported hematuria but appears to have resolved. Patient on warfarin PTA for history DVT/PE. Pharmacy consulted to manage warfarin while inpatient.  Currently on aspirin, Plavix and warfarin (and Lovenox). Plan is to stop Plavix on 1/22 and continue aspirin and warfarin only.  PTA warfarin dose: 2.5mg  daily except 1.25mg  on Mondays.  INR remains subtherapeutic at 1.7 this morning. Has required much higher doses of warfarin, likely due to change in nutrition, has been on tube feeds since 12/30. If tube feeding changes or stops, consider this when dosing/monitoring warfarin.   Lovenox resumed until INR therapeutic.  Goal of Therapy:   INR goal 2-3  Monitor platelets by anticoagulation protocol: Yes    Plan: Give Warfarin 6 mg po x 1 today Lovenox 75mg  SQ q12h (until INR therapeutic x 2) Plavix to end 05/28/20 Monitor daily INR, CBC, clinical course, s/sx  of bleed, PO intake/diet, Drug-Drug Interactions    Thank you for allowing [place] to participate in this patients care.   Jens Som, PharmD Please see amion for complete clinical pharmacist phone list. 05/23/2020 8:13 AM

## 2020-05-23 NOTE — TOC Progression Note (Signed)
Transition of Care [facility]) - Progression Note    Patient Details  Name: DAMICO PARTIN MRN: [id number] Date of Birth: 1941-03-23  Transition of Care [facility]) CM/SW [place], [place] Phone Number: 05/23/2020, 11:23 AM  Clinical Narrative:   CSW received update from MD that patient is being removed from airborne precautions today. CSW sent update to [name], but they do not have staffing to admit patients today due to weather. CSW spoke with daughter, [place], to update. Hope asked for MD and RN to please contact her with medical update, as she said it was difficult to get any information on the patient over the weekend. CSW sent messages to both to reach out to daughter with update when able. CSW to continue to follow.    Expected Discharge Plan: [place] Barriers to Discharge: SNF Covid,Continued Medical Work up,Insurance Authorization  Expected Discharge Plan and Services Expected Discharge Plan: [place] In-house Referral: Clinical Social Work Discharge Planning Services: CM Consult Post Acute Care Choice: [place] arrangements for the past 2 months: Single Family Home Expected Discharge Date: 05/18/20                                     Social Determinants of Health (SDOH) Interventions    Readmission Risk Interventions No flowsheet data found.

## 2020-05-24 ENCOUNTER — Other Ambulatory Visit (HOSPITAL_COMMUNITY): Payer: Self-pay | Admitting: Internal Medicine

## 2020-05-24 ENCOUNTER — Inpatient Hospital Stay (HOSPITAL_COMMUNITY): Payer: Medicare Other

## 2020-05-24 LAB — GLUCOSE, CAPILLARY
Glucose-Capillary: 130 mg/dL — ABNORMAL HIGH (ref 70–99)
Glucose-Capillary: 99 mg/dL (ref 70–99)
Glucose-Capillary: 120 mg/dL — ABNORMAL HIGH (ref 70–99)

## 2020-05-24 LAB — PROTIME-INR
INR: 1.8 — ABNORMAL HIGH (ref 0.8–1.2)
Prothrombin Time: 20.4 seconds — ABNORMAL HIGH (ref 11.4–15.2)

## 2020-05-24 MED ORDER — BENZONATATE 200 MG PO CAPS: 200.0000 mg | ORAL_CAPSULE | Freq: Three times a day (TID) | ORAL | 0 refills | Status: AC | PRN

## 2020-05-24 MED ORDER — SENNOSIDES-DOCUSATE SODIUM 8.6-50 MG PO TABS: 1.0000 | ORAL_TABLET | Freq: Every day | ORAL | Status: AC

## 2020-05-24 MED ORDER — ENOXAPARIN SODIUM 80 MG/0.8ML ~~LOC~~ SOLN: 1.0000 mg/kg | Freq: Two times a day (BID) | SUBCUTANEOUS | Status: DC

## 2020-05-24 MED ORDER — JEVITY 1.5 CAL/FIBER PO LIQD: 55.0000 mL/h | ORAL | 0 refills | Status: AC

## 2020-05-24 MED ORDER — FINASTERIDE 5 MG PO TABS: 5.0000 mg | ORAL_TABLET | Freq: Every day | ORAL | Status: AC

## 2020-05-24 MED ORDER — GUAIFENESIN-DM 100-10 MG/5ML PO SYRP: 5.0000 mL | ORAL_SOLUTION | Freq: Four times a day (QID) | ORAL | 0 refills | Status: AC

## 2020-05-24 MED ORDER — ASPIRIN 81 MG PO CHEW: 81.0000 mg | CHEWABLE_TABLET | Freq: Every day | ORAL | Status: AC

## 2020-05-24 MED ORDER — OXYCODONE HCL 5 MG PO TABS: 5.0000 mg | ORAL_TABLET | Freq: Four times a day (QID) | ORAL | 0 refills | Status: AC | PRN

## 2020-05-24 MED ORDER — PROSOURCE TF PO LIQD: 45.0000 mL | Freq: Two times a day (BID) | ORAL | Status: AC

## 2020-05-24 MED ORDER — CLOPIDOGREL BISULFATE 75 MG PO TABS: 75.0000 mg | ORAL_TABLET | Freq: Every day | ORAL | Status: AC

## 2020-05-24 MED ORDER — FREE WATER: 200.0000 mL | Freq: Four times a day (QID) | Status: AC

## 2020-05-24 MED ORDER — WARFARIN SODIUM 6 MG PO TABS
6.0000 mg | ORAL_TABLET | Freq: Once | ORAL | Status: AC
  Administered 2020-05-24: 6 mg
  Filled 2020-05-24: qty 1

## 2020-05-24 MED ORDER — CYCLOBENZAPRINE HCL 5 MG PO TABS: 5.0000 mg | ORAL_TABLET | Freq: Every evening | ORAL | 0 refills | Status: AC | PRN

## 2020-05-24 MED FILL — JEVITY 1.5 CAL/FIBER LIQD: 3 days supply | Qty: 4000 | Fill #0

## 2020-05-24 NOTE — Progress Notes (Signed)
Speech Language Pathology Treatment: Cognitive-Linquistic;Dysphagia  Patient Details Name: John Lambert MRN: [id number] DOB: [birth year] Today's Date: 05/24/2020 Time:  -     Assessment / Plan / Recommendation Clinical Impression  SWALLOWING Pt with increased baseline coughing today.  SLP provided oral care prior to administration of PO trials.  Pt somewhat resistant, but with encouragement allowed more thorough oral care.  Pt completed 18 swallows with 9 ice chips.  Pt exhibited prompt oral response and typically required 2 swallows per bolus trial.  Cough continued throughout session, but pt denied cough being related to PO intake. Recommend repeat MBSS to re-assess pharyngeal swallow function once COVID contact precautions are removed.  According to chart review, pt can be removed as of 1/17, but is still under precautions as of now.  COMMUNICATION Pt's communication has dramatically improved since his most recent session.  Today pt participated in choral singing task with around 50% accuracy.  Cough and decreased breath support impacted performance.  Pt was able to both provide melody and articulate lyrics.  Pt named 4 of 5 objects with confrontational naming task.  Pt completed repetition task with 100% accuracy for word level.  With phrases, dysarthria made it difficult to assess accuracy of repetition.  Pt does appear to have moderate to severe dysarthria with possible apraxia as well which impacts pt's intelligibility, especially in unknown context.  Pt's yes and no head gestures were much more clear today. Pt was able to communicate basic needs related to comfort prior to SLP departure.     HPI HPI: Pt is an 80 y.o. 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 He also has pain in left upper leg/hip region, and R foot drag;  Imaging notable for Numerous scattered punctate acute infarctions in the left  posterior frontal region consistent with micro embolic infarctions  which could either be in the anterior or middle cerebral artery  territory. Punctate acute infarction also affecting the splenium of  the corpus callosum. Pt was seen by SLP on 12/17 for a BSE and SLE with no subsequent need for SLP services. Left carotid TCAR procedure with stent placement conducted on 12/22. Following the procedure he was found to be aphasic unable to speak with left gaze deviation and right hemiparesis. Emergent CTA showed patent left carotid stent with good flow in the terminal left ICA as well as middle cerebral artery in the M1 segment. There appeared to be decreased flow in one of the M2 branches by neurology. SLP was consulted due to pt failing the [place], but a failed swallow screen has not been documented in EMR. EEG 12/22: No seizures. MRI 12/23: New areas of acute infarction are present scattered within the  left basal ganglia and radiating white matter tracts and within the  left frontoparietal cortical and subcortical brain.EEG 12/30: cortical dysfunction in left hemisphere, maximal left fronto-temporal region likely secondary to underlying stroke/structural abnormality. No seizures      SLP Plan  Continue with current plan of care;MBS (when off contact precautions)       Recommendations  Diet recommendations: NPO Medication Administration: Via alternative means                Oral Care Recommendations: Oral care QID Follow up Recommendations: Inpatient Rehab SLP Visit Diagnosis: Dysphagia, oropharyngeal phase (R13.12);Aphasia (R47.01) Plan: Continue with current plan  of care;MBS (when off contact precautions)       [place], [place], [place] Office: [phone] 05/24/2020, 11:44 AM

## 2020-05-24 NOTE — Plan of Care (Signed)
  Problem: Health Behavior/Discharge Planning: Goal: Ability to manage health-related needs will improve Outcome: Not Progressing   Problem: Clinical Measurements: Goal: Will remain free from infection Outcome: Not Progressing   Problem: Activity: Goal: Risk for activity intolerance will decrease Outcome: Not Progressing   Problem: Elimination: Goal: Will not experience complications related to bowel motility Outcome: Not Progressing

## 2020-05-24 NOTE — Progress Notes (Signed)
Physical Therapy Treatment Patient Details Name: John Lambert MRN: [id number] DOB: 11-15-1940 Today's Date: 05/24/2020    History of Present Illness John Lambert is a 81 y.o. male admitted 12/14 with bil LE weakness and difficulty walking as well as left hip pain. Imaging notable for Numerous scattered punctate acute infarctions in the left  posterior frontal region consistent with micro embolic infarctions. Punctate acute infarction also affecting the splenium of  the corpus callosum.  Pt with L carotid artery stenosis s/p stent on 04/27/20. Post procedure pt with right hemiparesis, aphasia and left gaze with intraprocedural stroke. Code stroke called with CT head (-) for acute findings. Emergent L carotid diagnostic cerebral cath angiogram (-) for acute findings. MRI 12/23 (+) patchy left basal ganglia as well as left frontoparietal cortical and subcortical infarcts. Tested COVID + on 05/13/2020. 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    PT Comments    Patient assisted into chair position (including pt "hooking" LUE/elbow with therapist's to pull his torso away from the bed to remove pillow behind his shoulders and head). Patient fully participating in seated exercise program with continued rt hemiparesis noted. Patient able to tolerate resisted exercises for left extremities and AAROM for rt extremities. See below for additional details. Sitting EOB deferred due to lack of +2 assist at this time.    Follow Up Recommendations  SNF;Supervision/Assistance - 24 hour     Equipment Recommendations  Wheelchair (measurements PT);Wheelchair cushion (measurements PT);Hospital bed    Recommendations for Other Services       Precautions / Restrictions Precautions Precautions: Fall Precaution Comments: PEG, R HEMI, COVID    Mobility  Bed Mobility Overal bed mobility: Needs Assistance Bed Mobility: Supine to Sit     Supine to  sit:  (via bed to chair position)        Transfers                    Ambulation/Gait                 Stairs             Wheelchair Mobility    Modified Rankin (Stroke Patients Only) Modified Rankin (Stroke Patients Only) Pre-Morbid Rankin Score: No significant disability Modified Rankin: Severe disability     Balance Overall balance assessment: Needs assistance Sitting-balance support: Bilateral upper extremity supported;Feet supported Sitting balance-Leahy Scale: Poor Sitting balance - Comments: in chair position, leans rt Postural control: Posterior lean;Right lateral lean                                  Cognition Arousal/Alertness: Awake/alert Behavior During Therapy: Flat affect Overall Cognitive Status: Difficult to assess                         Following Commands: Follows one step commands consistently (with LUE and bil LEs)     Problem Solving: Slow processing;Decreased initiation;Requires verbal cues;Requires tactile cues        Exercises General Exercises - Upper Extremity Shoulder Flexion: AROM;Left;AAROM;Right;5 reps Shoulder Extension: PROM;10 reps;Strengthening;Left;Seated (manual resistance to shoulder ext from flexed position) Elbow Flexion: Strengthening;Left;AAROM;Right;10 reps;Seated (Bed in chair position; manual resistance LUE) Elbow Extension: Strengthening;Left;AAROM;Right;10 reps (Bed in chair position) General Exercises - Lower Extremity Ankle Circles/Pumps: AROM;Both;10 reps Quad Sets: AROM;Both;5 reps Long Arc Quad: AROM;Both;10 reps (  bed in chair position) Hip ABduction/ADduction: Strengthening;Both;10 reps;Seated (squeezing pillow x 3 sec hold)    General Comments        Pertinent Vitals/Pain Pain Assessment: Faces Pain Score: 0-No pain Faces Pain Scale: No hurt    Home Living                      Prior Function            PT Goals (current goals can now be found  in the care plan section) Acute Rehab PT Goals Patient Stated Goal: none stated Time For Goal Achievement: 05/27/20 Potential to Achieve Goals: Fair Progress towards PT goals: Progressing toward goals    Frequency    Min 2X/week      PT Plan Current plan remains appropriate    Co-evaluation              AM-PAC PT "6 Clicks" Mobility   Outcome Measure  Help needed turning from your back to your side while in a flat bed without using bedrails?: A Lot Help needed moving from lying on your back to sitting on the side of a flat bed without using bedrails?: Total Help needed moving to and from a bed to a chair (including a wheelchair)?: Total Help needed standing up from a chair using your arms (e.g., wheelchair or bedside chair)?: Total Help needed to walk in hospital room?: Total Help needed climbing 3-5 steps with a railing? : Total 6 Click Score: 7    End of Session   Activity Tolerance: Patient tolerated treatment well Patient left: in bed;with call bell/phone within reach;with bed alarm set   PT Visit Diagnosis: Muscle weakness (generalized) (M62.81);History of falling (Z91.81);Unsteadiness on feet (R26.81);Other abnormalities of gait and mobility (R26.89);Difficulty in walking, not elsewhere classified (R26.2);Other symptoms and signs involving the nervous system (R29.898);Hemiplegia and hemiparesis Hemiplegia - Right/Left: Right Hemiplegia - caused by: Cerebral infarction     Time: 5621-3086 PT Time Calculation (min) (ACUTE ONLY): 27 min  Charges:  $Therapeutic Exercise: 23-37 mins                      Arby Barrette, PT Pager [phone]    Rexanne Mano 05/24/2020, 12:26 PM
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
## 2020-05-24 NOTE — Progress Notes (Signed)
SLP Cancellation Note  Patient Details Name: John Lambert MRN: [id number] DOB: Aug 07, 1940   D/C to SNF planned later this date.  A repeat MBSS is scheduled for 2 PM today to reassess pharyngeal swallow function prior to discharge.    Called daughter, [place], at [phone] to give update.  She was unavailable and SLP left voicemail.  Celedonio Savage, [place], [place] Office: [phone]  05/24/2020, 12:29 PM

## 2020-05-24 NOTE — Progress Notes (Signed)
[place] for Lovenox / Warfarin Indication: on PTA warfarin for hx of DVT/PE, new acute stroke, new Afib  Patient Measurements: Height: 6\' 2"  (188 cm) Weight: 73.6 kg (162 lb 4.1 oz) IBW/kg (Calculated) : 82.2  Heparin weight: 83.1  Vital Signs: Temp: 98.4 F (36.9 C) (01/18 0010) Temp Source: Axillary (01/18 0010) BP: 97/76 (01/18 0010) Pulse Rate: 95 (01/17 2050)  Labs: Recent Labs    05/22/20 0328 05/23/20 0022 05/24/20 0440  LABPROT 20.2* 19.0* 20.4*  INR 1.8* 1.7* 1.8*    Estimated Creatinine Clearance: 75.4 mL/min (by C-G formula based on SCr of 0.75 mg/dL).  Assessment: 80 yo male with L symptomatic carotid stenosis.  S/p L carotid TCAR procedure w/ stenting that was complicated by postprocedure stroke.  MRI brain shows patchy L basal ganglia and L frontoparietal cortical and subcortical infarcts (new). No hemorrhagic transformation. New stroke and new onset atrial fibrillation. Previous reported hematuria but appears to have resolved. Patient on warfarin PTA for history DVT/PE. Pharmacy consulted to manage warfarin while inpatient.  Currently on aspirin, Plavix and warfarin (Lovenox). Plan is to stop Plavix on 1/22 and continue aspirin and warfarin only.  PTA warfarin dose: 2.5mg  daily except 1.25mg  on Mondays.  INR remains subtherapeutic at 1.8 this morning. Has required much higher doses of warfarin, likely due to change in nutrition, has been on tube feeds since 12/30. If tube feeding changes or stops, consider this when dosing/monitoring warfarin.   Lovenox resumed until INR therapeutic.  Goal of Therapy:   INR goal 2-3  Monitor platelets by anticoagulation protocol: Yes    Plan: Give Warfarin 6 mg po x 1 today Lovenox 75mg  SQ q12h (until INR therapeutic x 2) Plavix to end 05/28/20 Monitor daily INR, CBC, clinical course, s/sx of bleed, PO intake/diet, Drug-Drug Interactions    Thank you for allowing [place] to participate  in this patients care.   Jens Som, PharmD Please see amion for complete clinical pharmacist phone list. 05/24/2020 8:29 AM

## 2020-05-24 NOTE — Progress Notes (Signed)
Modified Barium Swallow Progress Note  Patient Details  Name: John Lambert MRN: [id number] Date of Birth: 1940/08/29  Today's Date: 05/24/2020  Modified Barium Swallow completed.  Full report located under Chart Review in the Imaging Section.  Brief recommendations include the following:  Clinical Impression  Pt presents with mild improvements in swallowing function compared to most recent MBS on 05/04/20. He continues to have reduced lingual propulsion, leaving behind mild lingual residue that he often clears with a spontaneous second swallow. Timing is good for swallow initiation but he has reduced anterior hyolaryngeal movement, pharyngeal squeeze, base of tongue retraction, and epiglottic inversion. He has intermittent penetration and aspiration across consistencies, but more so with liquids. While he did not cough during PO trials, he did have a cough after testing that was quite delayed if related to aspiration. Penetration and aspiration both occur in small volumes, but aren't cleared well. He also has residue that almost completely fills his valleculae and cannot be fully cleared. Recommend that he continue to receive primary nutrition via alternative means (pt has a PEG), but recommend ongoing SLP services with consideration for introduction of therapeutic bites of puree.   Swallow Evaluation Recommendations       SLP Diet Recommendations: NPO;Alternative means - long-term      Medication Administration: Via alternative means     Oral Care Recommendations: Oral care QID        Osie Bond., M.A. [employer] Pager [phone] Office [phone]  05/24/2020,3:44 PM

## 2020-05-24 NOTE — Discharge Summary (Signed)
Physician Discharge Summary  John Lambert [id number] DOB: 02-06-1941 DOA: 04/19/2020  PCP: John Stains, MD  Admit date: 04/19/2020 Discharge date: 05/24/2020  Admitted From: Home Discharge disposition: SNF   Code Status: Full Code  Diet Recommendation: Cardiac   Discharge Diagnosis:   Principal Problem:   Cerebral embolism with cerebral infarction Active Problems:   Weakness   Anticoagulated on warfarin   Impaired ambulation   Fall at home, initial encounter   Essential hypertension   Blood clotting disorder ([place])   Stroke [facility])   Middle cerebral artery embolism, left   COVID-19 virus infection  Chief Complaint  Patient presents with  . Leg Pain  . Weakness   Brief narrative: John Lambert is a 80 y.o. 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 Patient presented to the ED on 12/14 with weakness, he was found to have scattered punctate left frontal cerebral infarctions. He was admitted for stroke work-up.  Found to have 80 to 99% stenosis of left ICA. 12/22, patient had left TCAR done after which she developed aphasia, left gaze deviation and right hemiparesis.  Code stroke was called in PACU.  He underwent emergent left cerebral angiogram but did not show any findings. Since then, he has had neurological deficits including dysphagia, right hemiparesis and right hemineglect. His hospital course was also complicated by UTI, urinary retention, hematuria, dysphagia requiring PEG tube placement and incidental nosocomial COVID-19 infection. Because of persistent dysphagia, patient underwent PEG tube placement by IR on 1/6. 1/7, incidental COVID. He was not sick from [place] but patient spent last 10 days in the hospital to complete COVID isolation before qualifying for discharge to SNF.  Subjective: Patient was seen and examined this morning.   Propped up in bed.  Not in  distress. Has persistent right hemiparesis, aphasia.  Watching TV.  Able to follow commands when asked to move left extremities.  Assessment/Plan: Acute CVA Left carotid stenosis. -Presented with weakness of lower extremities for 24 hours, impaired ambulation -MRI findings as above showing left carotid atherosclerosis. -S/p left TCAR by John Lambert on 12/22.  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 -Currently on aspirin, Plavix and Coumadin. -Plan is to stop Plavix on 1/22 to continue aspirin and Coumadin indefinitely. -Continue statin indefinitely.  COVID-19 infection -Patient only had mild symptoms.  Not on supplemental oxygen.  Chest x-ray clear.  Continue supportive care. -Off DC isolation since 1/17.  Essential hypertension -Currently blood pressure elevated controlled on Coreg only.  New diagnosis of paroxysmal A. Fib -Controlled on Coreg.  On Coumadin for anticoagulation due to pre-existing coagulopathy.    Blood clotting disorder, history of DVT/PE -On lifelong anticoagulation with Coumadin -Ultrasound duplex of lower extremities showed age-indeterminate, probably chronic DVT without symptoms. -For last 3 days, patient is having subtherapeutic INR because of which he is also on Lovenox subcu for bridging.  I will continue Lovenox subcu at discharge along with Coumadin.  He needs to have INR checked every 2 to 3 days for now.  Once INR level is stable, Lovenox is to be stopped. Recent Labs  Lab 05/20/20 0456 05/21/20 0328 05/22/20 0328 05/23/20 0022 05/24/20 0440  INR 2.0* 2.4* 1.8* 1.7* 1.8*   Left thigh pain -Shooting pain, intermittent.  Continue Flexeril nightly as needed.  Hematuria Klebsiella UTI -Antibiotic course completed.  Urine clear.  Acute renal retention BPH -Seen by urologist Dr.  Dahlstedt on 05/04/2020.  Currently has an indwelling Foley catheter.  Discharge with Foley catheter.   Continue finasteride. Follow-up with John Lambert as an outpatient.  Dysphagia -Status post PEG tube placement by IR on 1/6.  Continue tube feeding. -Continue speech therapy evaluation as an outpatient.  Stable for discharge to SNF today.  Wound care: Incision (Closed) 04/27/20 Groin Left (Active)  Date First Assessed/Time First Assessed: 04/27/20 0840   Location: Groin  Location Orientation: Left    Assessments 04/27/2020  9:21 AM 05/23/2020  9:00 AM  Dressing Type Liquid skin adhesive Liquid skin adhesive  Dressing - Dry;Clean     No Linked orders to display     Incision (Closed) 04/27/20 Neck Left (Active)  Date First Assessed/Time First Assessed: 04/27/20 0840   Location: Neck  Location Orientation: Left    Assessments 04/27/2020  9:21 AM 05/23/2020  9:00 AM  Dressing Type Liquid skin adhesive Liquid skin adhesive     No Linked orders to display     Wound / Incision (Open or Dehisced) 04/27/20 Puncture Groin Right arterial access puncture site- right femoral  (Active)  Date First Assessed/Time First Assessed: 04/27/20 1147   Wound Type: Puncture  Location: Groin  Location Orientation: Right  Wound Description (Comments): arterial access puncture site- right femoral   Present on Admission: No    Assessments 04/27/2020 11:47 AM 05/23/2020  9:00 AM  Dressing Type Gauze (Comment);Transparent dressing Liquid skin adhesive  Dressing Changed New -  Dressing Status Clean;Dry;Intact -  Dressing Change Frequency PRN -  Site / Wound Assessment Clean;Dry -  Peri-wound Assessment Intact -  Closure Other (Comment) -     No Linked orders to display    Discharge Exam:   Vitals:   05/23/20 1800 05/23/20 2050 05/24/20 0010 05/24/20 0800  BP:  119/69 97/76 107/68  Pulse:  95  (!) 104  Resp:  18  20  Temp: 98.9 F (37.2 C) 98.3 F (36.8 C) 98.4 F (36.9 C) 98.6 F (37 C)  TempSrc: Axillary Axillary Axillary Axillary  SpO2:  97% 98% 98%  Weight:      Height:        Body mass  index is 20.83 kg/m.  General exam: Pleasant, elderly Caucasian male.  Not in distress.  Not in pain Skin: No rashes, lesions or ulcers. HEENT: Atraumatic, normocephalic, no obvious bleeding Lungs: Clear to auscultation bilaterally CVS: Regular rate and rhythm, no murmur GI/Abd soft, nontender, nondistended, bowel sound present.  PEG tube site intact CNS: Alert, awake, able to move his left extremities.  3/5 strength in right lower extremity.  Unable to move right upper extremity. Psychiatry: Depressed look Extremities: No pedal edema, no calf tenderness  Follow ups:   Discharge Instructions    Ambulatory referral to Neurology   Complete by: As directed    Follow up with stroke clinic NP (Jessica Vanschaick or Cecille Rubin, if both not available, consider Zachery Dauer, or A[place]) at [facility] in about 4 weeks. Thanks.   Increase activity slowly   Complete by: As directed    No dressing needed   Complete by: As directed       Follow-up Information    [facility]. Schedule an appointment as soon as possible for a visit in 4 week(s).   Specialty: Neurology Contact information: [address] [place] [phone]       Serafina Mitchell, MD In 2 weeks.   Specialties: Vascular Surgery, Cardiology Why: Office  will call you to arrange your appt (sent) Contact information: [place] [address] [phone]        John Stains, MD Follow up.   Specialty: Family Medicine Contact information: [place] [address] [phone]        Franchot Gallo, MD Follow up.   Specialty: Urology Contact information: [place] [address] [phone]               Recommendations for Outpatient Follow-Up:   1. Follow-up with PCP as an outpatient 2. Follow-up with neurology as an outpatient 3. Follow-up with urology as an outpatient.  Discharge  Instructions:  Need to check INR every day to every other day.  Target INR 2-3.  To continue Lovenox bridge along with Coumadin as long as patient remains subtherapeutic.  Once therapeutic, Lovenox should be stopped.  Foley catheter to remain in place at discharge.  Voiding trial can be done at the facility or at urology follow-up.   Follow with Primary MD John Stains, MD in 7 days   Get CBC/BMP checked in next visit within 1 week by PCP or SNF MD ( we routinely change or add medications that can affect your baseline labs and fluid status, therefore we recommend that you get the mentioned basic workup next visit with your PCP, your PCP may decide not to get them or add new tests based on their clinical decision)  On your next visit with your PCP, please Get Medicines reviewed and adjusted.  Please request your PCP  to go over all Hospital Tests and Procedure/Radiological results at the follow up, please get all Hospital records sent to your Prim MD by signing hospital release before you go home.  Activity: As tolerated with Full fall precautions use walker/cane & assistance as needed  For Heart failure patients - Check your Weight same time everyday, if you gain over 2 pounds, or you develop in leg swelling, experience more shortness of breath or chest pain, call your Primary MD immediately. Follow Cardiac Low Salt Diet and 1.5 lit/day fluid restriction.  If you have smoked or chewed Tobacco in the last 2 yrs please stop smoking, stop any regular Alcohol  and or any Recreational drug use.  If you experience worsening of your admission symptoms, develop shortness of breath, life threatening emergency, suicidal or homicidal thoughts you must seek medical attention immediately by calling 911 or calling your MD immediately  if symptoms less severe.  You Must read complete instructions/literature along with all the possible adverse reactions/side effects for all the Medicines you take and that  have been prescribed to you. Take any new Medicines after you have completely understood and accpet all the possible adverse reactions/side effects.   Do not drive, operate heavy machinery, perform activities at heights, swimming or participation in water activities or provide baby sitting services if your were admitted for syncope or siezures until you have seen by Primary MD or a Neurologist and advised to do so again.  Do not drive when taking Pain medications.  Do not take more than prescribed Pain, Sleep and Anxiety Medications  Wear Seat belts while driving.   Please note You were cared for by a hospitalist during your hospital stay. If you have any questions about your discharge medications or the care you received while you were in the hospital after you are discharged, you can call the unit and asked to speak with the hospitalist on call  if the hospitalist that took care of you is not available. Once you are discharged, your primary care physician will handle any further medical issues. Please note that NO REFILLS for any discharge medications will be authorized once you are discharged, as it is imperative that you return to your primary care physician (or establish a relationship with a primary care physician if you do not have one) for your aftercare needs so that they can reassess your need for medications and monitor your lab values.    Allergies as of 05/24/2020      Reactions   Hctz [hydrochlorothiazide] Other (See Comments)   dizzy   Hydrocodone Itching   Vicodin [hydrocodone-acetaminophen] Itching      Medication List    STOP taking these medications   lisinopril 20 MG tablet Commonly known as: ZESTRIL     TAKE these medications   acetaminophen 500 MG tablet Commonly known as: TYLENOL Take 500 mg by mouth every 6 (six) hours as needed for mild pain.   aspirin 81 MG chewable tablet Place 1 tablet (81 mg total) into feeding tube daily. Start taking on: May 25, 2020   benzonatate 200 MG capsule Commonly known as: TESSALON Take 1 capsule (200 mg total) by mouth 3 (three) times daily as needed for cough.   carvedilol 25 MG tablet Commonly known as: COREG Take 25 mg by mouth 2 (two) times daily with a meal.   clopidogrel 75 MG tablet Commonly known as: PLAVIX Place 1 tablet (75 mg total) into feeding tube daily for 3 days. Start taking on: May 25, 2020   cyclobenzaprine 5 MG tablet Commonly known as: FLEXERIL Place 1 tablet (5 mg total) into feeding tube at bedtime as needed for up to 5 days for muscle spasms.   enoxaparin 80 MG/0.8ML injection Commonly known as: LOVENOX Inject 0.75 mLs (75 mg total) into the skin every 12 (twelve) hours. To continue till INR is therapeutic between 2-3   feeding supplement (PROSource TF) liquid Place 45 mLs into feeding tube 2 (two) times daily.   feeding supplement (JEVITY 1.5 CAL/FIBER) Liqd Place 55 mL/hr into feeding tube continuous for 3 days.   finasteride 5 MG tablet Commonly known as: PROSCAR Take 1 tablet (5 mg total) by mouth daily. Start taking on: May 25, 2020   free water Soln Place 200 mLs into feeding tube every 6 (six) hours.   guaiFENesin-dextromethorphan 100-10 MG/5ML syrup Commonly known as: ROBITUSSIN DM Place 5 mLs into feeding tube every 6 (six) hours.   oxyCODONE 5 MG immediate release tablet Commonly known as: Oxy IR/ROXICODONE Place 1 tablet (5 mg total) into feeding tube every 6 (six) hours as needed for up to 5 days for severe pain.   pravastatin 20 MG tablet Commonly known as: PRAVACHOL Take 20 mg by mouth daily.   senna-docusate 8.6-50 MG tablet Commonly known as: Senokot-S Place 1 tablet into feeding tube at bedtime.   warfarin 2.5 MG tablet Commonly known as: COUMADIN Take 1.25-2.5 mg by mouth See admin instructions. Take 1 tablet (2.5 MG) daily except on Mondays take 1/2 tablet (1.25 MG)            Discharge Care Instructions  (From  admission, onward)         Start     Ordered   05/24/20 0000  No dressing needed        05/24/20 1257          Time coordinating discharge: 35 minutes  The  results of significant diagnostics from this hospitalization (including imaging, microbiology, ancillary and laboratory) are listed below for reference.    Procedures and Diagnostic Studies:   CT ANGIO HEAD W OR WO CONTRAST  Result Date: 04/20/2020 CLINICAL DATA:  Stroke/TIA. EXAM: CT ANGIOGRAPHY HEAD AND NECK TECHNIQUE: Multidetector CT imaging of the head and neck was performed using the standard protocol during bolus administration of intravenous contrast. Multiplanar CT image reconstructions and MIPs were obtained to evaluate the vascular anatomy. Carotid stenosis measurements (when applicable) are obtained utilizing NASCET criteria, using the distal internal carotid diameter as the denominator. CONTRAST:  14mL OMNIPAQUE IOHEXOL 350 MG/ML SOLN COMPARISON:  MRI head from the same day. FINDINGS: CTA NECK FINDINGS Aortic arch: Great vessel origins are patent. Right carotid system: No evidence of dissection, stenosis (50% or greater) or occlusion. Left carotid system: Ulcerated atherosclerosis at the carotid bifurcation with resulting severe (approximately 80%) stenosis of the proximal internal carotid artery. Vertebral arteries: Mildly left dominant. No evidence of dissection, stenosis (50% or greater) or occlusion. Skeleton: Multilevel degenerative changes of the spine. Flowing anterior disc osteophytes, compatible with diffuse idiopathic skeletal hyperostosis. No evidence of acute abnormality. Other neck: No mass or suspicious adenopathy. Upper chest: Partially imaged ground-glass opacities in the right upper lung. Review of the MIP images confirms the above findings CTA HEAD FINDINGS Anterior circulation: No significant stenosis, proximal occlusion, aneurysm, or vascular malformation. Absent right A1 ACA with the right A2 ACA rising from  the dominant left A1 ACA, anatomic variant. Posterior circulation: Moderate stenosis of the intradural left vertebral artery. Right fetal type PCA with small right P1 PCA. Venous sinuses: As permitted by contrast timing, patent. Review of the MIP images confirms the above findings IMPRESSION: 1. Ulcerated atherosclerosis at the left carotid bifurcation with resulting severe (approximately 80%) stenosis of the proximal internal carotid artery. 2. Moderate stenosis of the intradural left vertebral artery. 3. Intracranially, no evidence of large vessel occlusion or hemodynamically significant proximal stenosis. 4. Partially imaged ground-glass opacities in the right upper lung, which may be infectious or inflammatory. Recommend dedicated chest imaging to further evaluate. Electronically Signed   By: Margaretha Sheffield MD   On: 04/20/2020 16:34   CT ANGIO NECK W OR WO CONTRAST  Result Date: 04/20/2020 CLINICAL DATA:  Stroke/TIA. EXAM: CT ANGIOGRAPHY HEAD AND NECK TECHNIQUE: Multidetector CT imaging of the head and neck was performed using the standard protocol during bolus administration of intravenous contrast. Multiplanar CT image reconstructions and MIPs were obtained to evaluate the vascular anatomy. Carotid stenosis measurements (when applicable) are obtained utilizing NASCET criteria, using the distal internal carotid diameter as the denominator. CONTRAST:  41mL OMNIPAQUE IOHEXOL 350 MG/ML SOLN COMPARISON:  MRI head from the same day. FINDINGS: CTA NECK FINDINGS Aortic arch: Great vessel origins are patent. Right carotid system: No evidence of dissection, stenosis (50% or greater) or occlusion. Left carotid system: Ulcerated atherosclerosis at the carotid bifurcation with resulting severe (approximately 80%) stenosis of the proximal internal carotid artery. Vertebral arteries: Mildly left dominant. No evidence of dissection, stenosis (50% or greater) or occlusion. Skeleton: Multilevel degenerative changes of  the spine. Flowing anterior disc osteophytes, compatible with diffuse idiopathic skeletal hyperostosis. No evidence of acute abnormality. Other neck: No mass or suspicious adenopathy. Upper chest: Partially imaged ground-glass opacities in the right upper lung. Review of the MIP images confirms the above findings CTA HEAD FINDINGS Anterior circulation: No significant stenosis, proximal occlusion, aneurysm, or vascular malformation. Absent right A1 ACA with the right A2 ACA  rising from the dominant left A1 ACA, anatomic variant. Posterior circulation: Moderate stenosis of the intradural left vertebral artery. Right fetal type PCA with small right P1 PCA. Venous sinuses: As permitted by contrast timing, patent. Review of the MIP images confirms the above findings IMPRESSION: 1. Ulcerated atherosclerosis at the left carotid bifurcation with resulting severe (approximately 80%) stenosis of the proximal internal carotid artery. 2. Moderate stenosis of the intradural left vertebral artery. 3. Intracranially, no evidence of large vessel occlusion or hemodynamically significant proximal stenosis. 4. Partially imaged ground-glass opacities in the right upper lung, which may be infectious or inflammatory. Recommend dedicated chest imaging to further evaluate. Electronically Signed   By: Margaretha Sheffield MD   On: 04/20/2020 16:34   MR BRAIN WO CONTRAST  Result Date: 04/20/2020 CLINICAL DATA:  Acute presentation leg weakness difficulty walking EXAM: MRI HEAD WITHOUT CONTRAST TECHNIQUE: Multiplanar, multiecho pulse sequences of the brain and surrounding structures were obtained without intravenous contrast. COMPARISON:  None. FINDINGS: Brain: There are numerous scattered punctate acute infarctions affecting the left posterior frontal region, none larger than 1 cm, consistent with a cluster of micro embolic infarctions which could either be in the anterior or middle cerebral artery territory. Punctate acute infarction also  affecting the splenium of the corpus callosum. No acute infarction in the right hemisphere, brainstem or cerebellum. Old small vessel infarctions affect the cerebellum and the pons. Chronic small-vessel ischemic changes present throughout the cerebral hemispheric white matter. Old cortical and subcortical infarctions at the left parietal vertex. 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 No hydrocephalus, mass or extra-axial collection. No swelling or acute hemorrhage. Vascular: Major vessels at the base of the brain show flow. Skull and upper cervical spine: Negative Sinuses/Orbits: Clear/normal Other: None IMPRESSION: 1. Numerous scattered punctate acute infarctions in the left posterior frontal region consistent with micro embolic infarctions which could either be in the anterior or middle cerebral artery territory. Punctate acute infarction also affecting the splenium of the corpus callosum. 2. Extensive chronic small-vessel ischemic changes elsewhere throughout the brain as outlined above. 3. Small old left parietal vertex cortical and subcortical infarctions. Electronically Signed   By: Nelson Chimes M.D.   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 Acute foot drag. Clotting disorder. Weakness in legs, difficulty walking. EXAM: MRI LUMBAR SPINE WITHOUT CONTRAST TECHNIQUE: Multiplanar, multisequence MR imaging of the lumbar spine was performed. No intravenous contrast was administered. COMPARISON:  Hip CT yesterday. FINDINGS: Segmentation: Lumbar segmentation appears to be normal and will be designated as such for this report. Alignment: Preserved, somewhat exaggerated lumbar lordosis. No spondylolisthesis. Vertebrae: No marrow edema or evidence of acute osseous abnormality. Background bone marrow signal mildly heterogeneous but within normal limits. Visible sacrum and SI joints appear intact.  Conus medullaris and cauda equina: Conus extends to the L1-L2 level. No lower spinal cord or conus signal abnormality. Beginning just below the L2-L3 disc level there is architectural distortion within the thecal sac with adhesions and clumping of nerve roots (series 6, image 18) which continues to the sacrum compatible with chronic arachnoiditis. No evidence of intrathecal mass. Paraspinal and other soft tissues: Tortuous abdominal aorta. Nonspecific mild bilateral perinephric fluid. Lumbar paraspinal soft tissues are within normal limits. Disc levels: Generally mild for age lumbar spine degeneration. No spinal stenosis above L4-L5. L4-L5: Circumferential disc bulge and endplate spurring with moderate posterior element hypertrophy. Arachnoiditis with distorted cauda  equina. Mild to moderate spinal stenosis. Mild right L4 foraminal stenosis. L5-S1: Disc space loss with bulky right anterolateral disc osteophyte complex. Mild facet hypertrophy. Arachnoiditis. No spinal stenosis. There is mild to moderate right lateral recess stenosis (right S1 nerve level series 3, image 7). Mild to moderate right L5 foraminal stenosis. IMPRESSION: 1. 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 No recurrent spinal tumor is evident. 2. Superimposed lumbar spine degeneration primarily at L4-L5 and L5-S1. L4-L5 multifactorial mild to moderate multifactorial spinal stenosis, mild right L4 neural foraminal stenosis. L5-S1 moderate right lateral recess and foraminal stenosis. Query Right L5 and/or S1 radiculitis. Electronically Signed   By: Genevie Ann M.D.   On: 04/20/2020 08:23   CT Hip Left Wo Contrast  Result Date: 04/19/2020 CLINICAL DATA:  Hip pain. EXAM: CT OF THE LEFT HIP WITHOUT CONTRAST TECHNIQUE: Multidetector CT imaging of the left hip was performed according to the standard protocol. Multiplanar CT image reconstructions were also generated. COMPARISON:   X-ray earlier same day FINDINGS: Bones/Joint/Cartilage No evidence for an acute fracture in the visualized left hemipelvis or proximal left femur. Specifically, no evidence for femoral neck fracture. No disruption of the tensile or compressive trabeculated. Patient is noted to have degenerative changes in the acetabulum at least 2 small ossified intra-articular loose bodies in the joint space of the left hip. No substantial joint effusion. Ligaments Suboptimally assessed by CT. Muscles and Tendons No intramuscular or subcutaneous hemorrhage/hematoma. Soft tissues Unremarkable. IMPRESSION: 1. No evidence for acute fracture in the visualized left hemipelvis or proximal left femur. Specifically, no evidence for femoral neck fracture. 2. Degenerative changes in the left hip with at least 2 small ossified intra-articular loose bodies in the joint space of the left hip. Electronically Signed   By: Misty Stanley M.D.   On: 04/19/2020 05:16   ECHOCARDIOGRAM COMPLETE BUBBLE STUDY  Result Date: 04/20/2020    ECHOCARDIOGRAM REPORT   Patient Name:   John Lambert [facility] Date of Exam: 04/20/2020 Medical Rec #:  [id number]      Height:       74.0 in Accession #:    [id number]     Weight:       167.0 lb Date of Birth:  Mar 28, 1941      BSA:          2.013 m Patient Age:    25 years       BP:           120/51 mmHg Patient Gender: M              HR:           70 bpm. Exam Location:  Inpatient Procedure: 2D Echo, Color Doppler, Cardiac Doppler and Saline Contrast Bubble            Study Indications:    Stroke i163.9  History:        Patient has no prior history of Echocardiogram examinations.                 Risk Factors:Hypertension and Dyslipidemia.  Sonographer:    Raquel Sarna Senior RDCS Referring Phys: [id number] [place]  1. Left ventricular ejection fraction, by estimation, is 60 to 65%. The left ventricle has normal function. The left ventricle has no regional wall motion abnormalities. There is moderate left ventricular  hypertrophy of the basal-septal segment. Left ventricular diastolic parameters are consistent with Grade I diastolic dysfunction (  impaired relaxation).  2. Right ventricular systolic function is normal. The right ventricular size is normal. Tricuspid regurgitation signal is inadequate for assessing PA pressure.  3. The mitral valve is normal in structure. Trivial mitral valve regurgitation. No evidence of mitral stenosis.  4. The aortic valve is tricuspid. Aortic valve regurgitation is trivial. Mild to moderate aortic valve sclerosis/calcification is present, without any evidence of aortic stenosis.  5. The inferior vena cava is normal in size with <50% respiratory variability, suggesting right atrial pressure of 8 mmHg. FINDINGS  Left Ventricle: Left ventricular ejection fraction, by estimation, is 60 to 65%. The left ventricle has normal function. The left ventricle has no regional wall motion abnormalities. The left ventricular internal cavity size was normal in size. There is  moderate left ventricular hypertrophy of the basal-septal segment. Left ventricular diastolic parameters are consistent with Grade I diastolic dysfunction (impaired relaxation). Normal left ventricular filling pressure. Right Ventricle: The right ventricular size is normal. No increase in right ventricular wall thickness. Right ventricular systolic function is normal. Tricuspid regurgitation signal is inadequate for assessing PA pressure. Left Atrium: Left atrial size was normal in size. Right Atrium: Right atrial size was normal in size. Pericardium: There is no evidence of pericardial effusion. Mitral Valve: The mitral valve is normal in structure. There is mild calcification of the anterior mitral valve leaflet(s). Mild mitral annular calcification. Trivial mitral valve regurgitation. No evidence of mitral valve stenosis. Tricuspid Valve: The tricuspid valve is normal in structure. Tricuspid valve regurgitation is trivial. No evidence  of tricuspid stenosis. Aortic Valve: The aortic valve is tricuspid. Aortic valve regurgitation is trivial. Mild to moderate aortic valve sclerosis/calcification is present, without any evidence of aortic stenosis. Pulmonic Valve: The pulmonic valve was normal in structure. Pulmonic valve regurgitation is not visualized. No evidence of pulmonic stenosis. Aorta: The aortic root is normal in size and structure. Venous: The inferior vena cava is normal in size with less than 50% respiratory variability, suggesting right atrial pressure of 8 mmHg. IAS/Shunts: No atrial level shunt detected by color flow Doppler. Agitated saline contrast was given intravenously to evaluate for intracardiac shunting.  LEFT VENTRICLE PLAX 2D LVIDd:         2.70 cm  Diastology LVIDs:         1.90 cm  LV e' medial:    6.42 cm/s LV PW:         1.30 cm  LV E/e' medial:  9.1 LV IVS:        1.40 cm  LV e' lateral:   6.09 cm/s LVOT diam:     2.40 cm  LV E/e' lateral: 9.6 LV SV:         89 LV SV Index:   44 LVOT Area:     4.52 cm  RIGHT VENTRICLE TAPSE (M-mode): 1.6 cm LEFT ATRIUM             Index       RIGHT ATRIUM           Index LA diam:        3.60 cm 1.79 cm/m  RA Area:     19.20 cm LA Vol (A2C):   50.5 ml 25.09 ml/m RA Volume:   48.50 ml  24.09 ml/m LA Vol (A4C):   52.7 ml 26.18 ml/m LA Biplane Vol: 52.4 ml 26.03 ml/m  AORTIC VALVE LVOT Vmax:   83.60 cm/s LVOT Vmean:  58.400 cm/s LVOT VTI:    0.197 m  AORTA Ao  Root diam: 3.50 cm Ao Asc diam:  3.50 cm MITRAL VALVE MV Area (PHT): 2.39 cm    SHUNTS MV Decel Time: 317 msec    Systemic VTI:  0.20 m MV E velocity: 58.30 cm/s  Systemic Diam: 2.40 cm MV A velocity: 71.60 cm/s MV E/A ratio:  0.81 Fransico Him MD Electronically signed by Fransico Him MD Signature Date/Time: 04/20/2020/1:36:43 PM    Final    DG Hip Unilat W or Wo Pelvis 2-3 Views Left  Result Date: 04/19/2020 CLINICAL DATA:  Fall, left hip pain EXAM: DG HIP (WITH OR WITHOUT PELVIS) 2-3V LEFT COMPARISON:  None. FINDINGS:  Single view radiograph of the pelvis and two view radiograph of the left hip demonstrates normal alignment. No fracture or dislocation. There is at least mild left hip degenerative arthritis with asymmetric joint space narrowing. Vascular calcifications are seen within the left hemipelvis. IMPRESSION: No acute fracture or dislocation. Electronically Signed   By: Fidela Salisbury MD   On: 04/19/2020 02:57   DG FEMUR MIN 2 VIEWS LEFT  Result Date: 04/19/2020 CLINICAL DATA:  Fall, pain EXAM: LEFT FEMUR 2 VIEWS COMPARISON:  None. FINDINGS: No acute bony abnormality. Specifically, no fracture, subluxation, or dislocation. Joint spaces maintained. IMPRESSION: No acute bony abnormality. Electronically Signed   By: Rolm Baptise M.D.   On: 04/19/2020 02:57   VAS US CAROTID  Result Date: 04/21/2020 Carotid Arterial Duplex Study Indications:       CVA. Risk Factors:      Hypertension, Diabetes, past history of smoking, coronary                    artery disease. Comparison Study:  No prior studies. Performing Technologist: Darlin Coco RDMS  Examination Guidelines: A complete evaluation includes B-mode imaging, spectral Doppler, color Doppler, and power Doppler as needed of all accessible portions of each vessel. Bilateral testing is considered an integral part of a complete examination. Limited examinations for reoccurring indications may be performed as noted.  Right Carotid Findings: +----------+--------+--------+--------+------------------+--------+           PSV cm/sEDV cm/sStenosisPlaque DescriptionComments +----------+--------+--------+--------+------------------+--------+ CCA Prox  103     15                                         +----------+--------+--------+--------+------------------+--------+ CCA Distal86      14                                         +----------+--------+--------+--------+------------------+--------+ ICA Prox  54      14      1-39%   heterogenous                +----------+--------+--------+--------+------------------+--------+ ICA Distal73      15                                         +----------+--------+--------+--------+------------------+--------+ ECA       90                                                 +----------+--------+--------+--------+------------------+--------+ +----------+--------+-------+----------------+-------------------+  PSV cm/sEDV cmsDescribe        Arm Pressure (mmHG) +----------+--------+-------+----------------+-------------------+ Subclavian117            Multiphasic, WNL                    +----------+--------+-------+----------------+-------------------+ +---------+--------+--+--------+-+---------+ VertebralPSV cm/s45EDV cm/s9Antegrade +---------+--------+--+--------+-+---------+  Left Carotid Findings: +----------+--------+--------+--------+--------------------+-------------------+           PSV cm/sEDV cm/sStenosisPlaque Description  Comments            +----------+--------+--------+--------+--------------------+-------------------+ CCA Prox  106     14                                                      +----------+--------+--------+--------+--------------------+-------------------+ CCA Distal88      18                                                      +----------+--------+--------+--------+--------------------+-------------------+ ICA Prox  364     97      80-99%  heterogenous and    80-99% based on                                       hypoechoic          peak systolic                                                             velocities and                                                            IC/CC ratio         +----------+--------+--------+--------+--------------------+-------------------+ ICA Mid   404     70      80-99%  heterogenous and    80-99% based on                                       hypoechoic           peak systolic                                                             velocities and  IC/CC ratio         +----------+--------+--------+--------+--------------------+-------------------+ ICA Distal170     33                                  tortuous            +----------+--------+--------+--------+--------------------+-------------------+ ECA       103     10                                                      +----------+--------+--------+--------+--------------------+-------------------+ +----------+--------+--------+----------------+-------------------+           PSV cm/sEDV cm/sDescribe        Arm Pressure (mmHG) +----------+--------+--------+----------------+-------------------+ Subclavian140             Multiphasic, WNL                    +----------+--------+--------+----------------+-------------------+ +---------+--------+--+--------+--+---------+ VertebralPSV cm/s47EDV cm/s13Antegrade +---------+--------+--+--------+--+---------+   Summary: Right Carotid: Velocities in the right ICA are consistent with a 1-39% stenosis. Left Carotid: Velocities in the left ICA are consistent with a 80-99% stenosis               based on peak systolic velocities and IC/CC ratio. Vertebrals:  Bilateral vertebral arteries demonstrate antegrade flow. Subclavians: Normal flow hemodynamics were seen in bilateral subclavian              arteries. *See table(s) above for measurements and observations.  Electronically signed by Rosalin Hawking MD on 04/21/2020 at 8:18:18 PM.    Final    VAS [place] LOWER EXTREMITY VENOUS (DVT)  Result Date: 04/20/2020  Lower Venous DVT Study Indications: Swelling. Other Indications: History of DVT and PE. Risk Factors: Cancer Hx melanoma removed from RT calf. Anticoagulation: Long term coumadin. Comparison Study: No prior studies. Performing Technologist: Darlin Coco RDMS  Examination  Guidelines: A complete evaluation includes B-mode imaging, spectral Doppler, color Doppler, and power Doppler as needed of all accessible portions of each vessel. Bilateral testing is considered an integral part of a complete examination. Limited examinations for reoccurring indications may be performed as noted. The reflux portion of the exam is performed with the patient in reverse Trendelenburg.  +---------+---------------+---------+-----------+----------+-----------------+ RIGHT    CompressibilityPhasicitySpontaneityPropertiesThrombus Aging    +---------+---------------+---------+-----------+----------+-----------------+ CFV      Partial        Yes      Yes                  Age Indeterminate +---------+---------------+---------+-----------+----------+-----------------+ SFJ                     Yes      Yes                                    +---------+---------------+---------+-----------+----------+-----------------+ FV Prox                 Yes      Yes                                    +---------+---------------+---------+-----------+----------+-----------------+ FV Mid  Yes      Yes                                    +---------+---------------+---------+-----------+----------+-----------------+ FV Distal               Yes      Yes                                    +---------+---------------+---------+-----------+----------+-----------------+ PFV                     Yes      Yes                                    +---------+---------------+---------+-----------+----------+-----------------+ POP      Partial        Yes      Yes                  Age Indeterminate +---------+---------------+---------+-----------+----------+-----------------+ PTV                     Yes      Yes                                    +---------+---------------+---------+-----------+----------+-----------------+ PERO                    Yes      Yes                                     +---------+---------------+---------+-----------+----------+-----------------+   +---------+---------------+---------+-----------+----------+--------------+ LEFT     CompressibilityPhasicitySpontaneityPropertiesThrombus Aging +---------+---------------+---------+-----------+----------+--------------+ CFV      Full           Yes      Yes                                 +---------+---------------+---------+-----------+----------+--------------+ SFJ      Full                                                        +---------+---------------+---------+-----------+----------+--------------+ FV Prox  Full                                                        +---------+---------------+---------+-----------+----------+--------------+ FV Mid   Full                                                        +---------+---------------+---------+-----------+----------+--------------+ FV DistalFull                                                        +---------+---------------+---------+-----------+----------+--------------+  PFV      Full                                                        +---------+---------------+---------+-----------+----------+--------------+ POP      Full           Yes      Yes                                 +---------+---------------+---------+-----------+----------+--------------+ PTV      Full                                                        +---------+---------------+---------+-----------+----------+--------------+ PERO     Full                                                        +---------+---------------+---------+-----------+----------+--------------+     Summary: RIGHT: - Findings consistent with partial, non-obstructing age indeterminate deep vein thrombosis involving the right common femoral vein, and right popliteal vein. - No cystic structure found in the popliteal fossa.  LEFT: -  No evidence of common femoral vein obstruction.  *See table(s) above for measurements and observations. Electronically signed by Harold Barban MD on 04/20/2020 at 10:04:28 PM.    Final      Labs:   Basic Metabolic Panel: Recent Labs  Lab 05/18/20 0422 05/19/20 0408 05/21/20 0328  NA 135 135 135  K 4.7 4.6 4.2  CL 101 98 97*  CO2 23 26 29   GLUCOSE 131* 118* 113*  BUN 24* 28* 33*  CREATININE 0.65 0.76 0.75  CALCIUM 9.0 9.0 8.6*   GFR Estimated Creatinine Clearance: 75.4 mL/min (by C-G formula based on SCr of 0.75 mg/dL). Liver Function Tests: Recent Labs  Lab 05/19/20 0408  AST 51*  ALT 61*  ALKPHOS 52  BILITOT 1.1  PROT 6.8  ALBUMIN 3.2*   No results for input(s): LIPASE, AMYLASE in the last 168 hours. No results for input(s): AMMONIA in the last 168 hours. Coagulation profile Recent Labs  Lab 05/20/20 0456 05/21/20 0328 05/22/20 0328 05/23/20 0022 05/24/20 0440  INR 2.0* 2.4* 1.8* 1.7* 1.8*    CBC: Recent Labs  Lab 05/18/20 0422 05/19/20 0408 05/21/20 0328  WBC 6.1 9.1 6.2  HGB 13.5 14.3 12.9*  HCT 43.3 42.5 41.3  MCV 95.0 91.0 93.7  PLT 269 357 273   Cardiac Enzymes: No results for input(s): CKTOTAL, CKMB, CKMBINDEX, TROPONINI in the last 168 hours. BNP: Invalid input(s): POCBNP CBG: Recent Labs  Lab 05/23/20 0506 05/23/20 0845 05/23/20 1237 05/23/20 1617 05/24/20 0811  GLUCAP 112* 133* 114* 117* 130*   D-Dimer No results for input(s): DDIMER in the last 72 hours. Hgb A1c No results for input(s): HGBA1C in the last 72 hours. Lipid Profile No results for input(s): CHOL, HDL, LDLCALC, TRIG, CHOLHDL, LDLDIRECT in the last 72 hours. Thyroid function studies No results for input(s): TSH, T4TOTAL, T3FREE,  THYROIDAB in the last 72 hours.  Invalid input(s): FREET3 Anemia work up No results for input(s): VITAMINB12, FOLATE, FERRITIN, TIBC, IRON, RETICCTPCT in the last 72 hours. Microbiology No results found for this or any previous visit  (from the past 240 hour(s)).   Signed: Terrilee Croak  [facility] 05/24/2020, 12:58 PM

## 2020-05-25 DIAGNOSIS — I6602 Occlusion and stenosis of left middle cerebral artery: Secondary | ICD-10-CM | POA: Diagnosis not present

## 2020-05-25 DIAGNOSIS — N179 Acute kidney failure, unspecified: Secondary | ICD-10-CM | POA: Diagnosis not present

## 2020-05-25 DIAGNOSIS — Z7901 Long term (current) use of anticoagulants: Secondary | ICD-10-CM | POA: Diagnosis not present

## 2020-05-25 DIAGNOSIS — R0989 Other specified symptoms and signs involving the circulatory and respiratory systems: Secondary | ICD-10-CM | POA: Diagnosis not present

## 2020-05-25 DIAGNOSIS — R1310 Dysphagia, unspecified: Secondary | ICD-10-CM | POA: Diagnosis not present

## 2020-05-25 DIAGNOSIS — E1165 Type 2 diabetes mellitus with hyperglycemia: Secondary | ICD-10-CM | POA: Diagnosis not present

## 2020-05-25 DIAGNOSIS — I639 Cerebral infarction, unspecified: Secondary | ICD-10-CM | POA: Diagnosis not present

## 2020-05-25 DIAGNOSIS — R262 Difficulty in walking, not elsewhere classified: Secondary | ICD-10-CM | POA: Diagnosis not present

## 2020-05-25 DIAGNOSIS — R489 Unspecified symbolic dysfunctions: Secondary | ICD-10-CM | POA: Diagnosis not present

## 2020-05-25 DIAGNOSIS — J690 Pneumonitis due to inhalation of food and vomit: Secondary | ICD-10-CM | POA: Diagnosis not present

## 2020-05-25 DIAGNOSIS — Z743 Need for continuous supervision: Secondary | ICD-10-CM | POA: Diagnosis not present

## 2020-05-25 DIAGNOSIS — R059 Cough, unspecified: Secondary | ICD-10-CM | POA: Diagnosis not present

## 2020-05-25 DIAGNOSIS — Y92009 Unspecified place in unspecified non-institutional (private) residence as the place of occurrence of the external cause: Secondary | ICD-10-CM | POA: Diagnosis not present

## 2020-05-25 DIAGNOSIS — I69391 Dysphagia following cerebral infarction: Secondary | ICD-10-CM | POA: Diagnosis not present

## 2020-05-25 DIAGNOSIS — W19XXXA Unspecified fall, initial encounter: Secondary | ICD-10-CM | POA: Diagnosis not present

## 2020-05-25 DIAGNOSIS — I480 Paroxysmal atrial fibrillation: Secondary | ICD-10-CM | POA: Diagnosis not present

## 2020-05-25 DIAGNOSIS — D689 Coagulation defect, unspecified: Secondary | ICD-10-CM | POA: Diagnosis not present

## 2020-05-25 DIAGNOSIS — I69959 Hemiplegia and hemiparesis following unspecified cerebrovascular disease affecting unspecified side: Secondary | ICD-10-CM | POA: Diagnosis not present

## 2020-05-25 DIAGNOSIS — I499 Cardiac arrhythmia, unspecified: Secondary | ICD-10-CM | POA: Diagnosis not present

## 2020-05-25 DIAGNOSIS — I82409 Acute embolism and thrombosis of unspecified deep veins of unspecified lower extremity: Secondary | ICD-10-CM | POA: Diagnosis not present

## 2020-05-25 DIAGNOSIS — M6281 Muscle weakness (generalized): Secondary | ICD-10-CM | POA: Diagnosis not present

## 2020-05-25 DIAGNOSIS — R1319 Other dysphagia: Secondary | ICD-10-CM | POA: Diagnosis not present

## 2020-05-25 DIAGNOSIS — R5381 Other malaise: Secondary | ICD-10-CM | POA: Diagnosis not present

## 2020-05-25 DIAGNOSIS — R1312 Dysphagia, oropharyngeal phase: Secondary | ICD-10-CM | POA: Diagnosis not present

## 2020-05-25 DIAGNOSIS — R0602 Shortness of breath: Secondary | ICD-10-CM | POA: Diagnosis not present

## 2020-05-25 DIAGNOSIS — I69320 Aphasia following cerebral infarction: Secondary | ICD-10-CM | POA: Diagnosis not present

## 2020-05-25 DIAGNOSIS — N390 Urinary tract infection, site not specified: Secondary | ICD-10-CM | POA: Diagnosis not present

## 2020-05-25 DIAGNOSIS — R791 Abnormal coagulation profile: Secondary | ICD-10-CM | POA: Diagnosis not present

## 2020-05-25 DIAGNOSIS — R279 Unspecified lack of coordination: Secondary | ICD-10-CM | POA: Diagnosis not present

## 2020-05-25 DIAGNOSIS — I10 Essential (primary) hypertension: Secondary | ICD-10-CM | POA: Diagnosis not present

## 2020-05-25 DIAGNOSIS — R404 Transient alteration of awareness: Secondary | ICD-10-CM | POA: Diagnosis not present

## 2020-05-25 DIAGNOSIS — R338 Other retention of urine: Secondary | ICD-10-CM | POA: Diagnosis not present

## 2020-05-25 DIAGNOSIS — J9690 Respiratory failure, unspecified, unspecified whether with hypoxia or hypercapnia: Secondary | ICD-10-CM | POA: Diagnosis not present

## 2020-05-25 DIAGNOSIS — R310 Gross hematuria: Secondary | ICD-10-CM | POA: Diagnosis not present

## 2020-05-25 DIAGNOSIS — I63132 Cerebral infarction due to embolism of left carotid artery: Secondary | ICD-10-CM | POA: Diagnosis not present

## 2020-05-25 DIAGNOSIS — U071 COVID-19: Secondary | ICD-10-CM | POA: Diagnosis not present

## 2020-05-25 DIAGNOSIS — I6529 Occlusion and stenosis of unspecified carotid artery: Secondary | ICD-10-CM | POA: Diagnosis not present

## 2020-05-25 DIAGNOSIS — I69351 Hemiplegia and hemiparesis following cerebral infarction affecting right dominant side: Secondary | ICD-10-CM | POA: Diagnosis not present

## 2020-05-25 DIAGNOSIS — R278 Other lack of coordination: Secondary | ICD-10-CM | POA: Diagnosis not present

## 2020-05-25 LAB — CBC
HCT: 39.4 % (ref 39.0–52.0)
Hemoglobin: 12.3 g/dL — ABNORMAL LOW (ref 13.0–17.0)
MCH: 29.8 pg (ref 26.0–34.0)
MCHC: 31.2 g/dL (ref 30.0–36.0)
MCV: 95.4 fL (ref 80.0–100.0)
Platelets: 231 10*3/uL (ref 150–400)
RBC: 4.13 MIL/uL — ABNORMAL LOW (ref 4.22–5.81)
RDW: 14.2 % (ref 11.5–15.5)
WBC: 5.3 10*3/uL (ref 4.0–10.5)
nRBC: 0 % (ref 0.0–0.2)

## 2020-05-25 LAB — PROTIME-INR
INR: 2.2 — ABNORMAL HIGH (ref 0.8–1.2)
Prothrombin Time: 23.9 seconds — ABNORMAL HIGH (ref 11.4–15.2)

## 2020-05-25 LAB — GLUCOSE, CAPILLARY
Glucose-Capillary: 105 mg/dL — ABNORMAL HIGH (ref 70–99)
Glucose-Capillary: 109 mg/dL — ABNORMAL HIGH (ref 70–99)
Glucose-Capillary: 109 mg/dL — ABNORMAL HIGH (ref 70–99)
Glucose-Capillary: 123 mg/dL — ABNORMAL HIGH (ref 70–99)
Glucose-Capillary: 131 mg/dL — ABNORMAL HIGH (ref 70–99)

## 2020-05-25 MED ORDER — WARFARIN SODIUM 4 MG PO TABS: 4.0000 mg | ORAL_TABLET | Freq: Every day | ORAL | Status: AC

## 2020-05-25 MED ORDER — WARFARIN SODIUM 4 MG PO TABS
4.0000 mg | ORAL_TABLET | Freq: Once | ORAL | Status: DC
  Filled 2020-05-25: qty 1

## 2020-05-25 NOTE — Progress Notes (Signed)
Nutrition Follow-up  DOCUMENTATION CODES:   Not applicable  INTERVENTION:  Continue via PEG: -Jevity 1.5 @ 48ml/hr (1365ml/d) -52ml Prosource TF BID -219ml free water Q6H   Provides 2060 kcal, 106 gm protein, 1003 ml free water daily (1835ml total free water with flushes)  NUTRITION DIAGNOSIS:   Inadequate oral intake related to dysphagia as evidenced by NPO status. -- ongoing  GOAL:   Patient will meet greater than or equal to 90% of their needs -- Met with TF  MONITOR:   TF tolerance,Labs,Diet advancement  REASON FOR ASSESSMENT:   Rounds   ASSESSMENT:   Pt admitted with weakness and L ICA stenosis s/p L TCAR complicated by post-op acute CVA resulting in aphasia and R hemiplegia. 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   12/22 s/p L TCAR; pt found to have new stroke 12/24 Cortrak placed (gastric) 12/27 Cortrak replaced (gastric) 1/6 s/p PEG placement 1/7 TF resumed; incidental COVID-19 +  Pt's COVID isolation period has ended. Pt was supposed to d/c to SNF yesterday, but was unable to be transported. Pt continues to be pending d/c.   Pt still tolerating TF via PEG per RN. Current TF regimen: Jevity 1.5 @ 64ml/hr, 26ml Prosource TF BID, and 256ml free water Q6H.  Admit wt: 79.7 kg Current wt: 73.6 kg  UOP: 1647ml x24 hours  Labs: CBGs 109-131 Medications: senokot-s, warfarin  Diet Order:   Diet Order    None      EDUCATION NEEDS:   Not appropriate for education at this time  Skin:  Skin Assessment: Skin Integrity Issues: Skin Integrity Issues:: Incisions,Other (Comment) Incisions: groin and neck Other: puncture groin  Last BM:  1/8  Height:   Ht Readings from Last 1 Encounters:  04/20/20 $RemoveB'6\' 2"'SEVaInQg$  (1.88 m)    Weight:   Wt Readings from Last 1 Encounters:  05/23/20 73.6 kg    Ideal Body Weight:  86.4 kg  BMI:  Body mass index is 20.83  kg/m.  Estimated Nutritional Needs:   Kcal:  2000-2200  Protein:  105-125 gm  Fluid:  >/= 2 L    Larkin Ina, MS, RD, LDN RD pager number and weekend/on-call pager number located in [place].

## 2020-05-25 NOTE — Progress Notes (Signed)
Telephone report given to [facility] at [facility]. [facility] called to inform pt is being transferred at this time.

## 2020-05-25 NOTE — Progress Notes (Signed)
[place] for Lovenox / Warfarin Indication: on PTA warfarin for hx of DVT/PE, new acute stroke, new Afib  Patient Measurements: Height: 6\' 2"  (188 cm) Weight: 73.6 kg (162 lb 4.1 oz) IBW/kg (Calculated) : 82.2  Heparin weight: 83.1  Vital Signs: Temp: 98.2 F (36.8 C) (01/19 0800) Temp Source: Oral (01/19 0800) BP: 101/63 (01/19 0800) Pulse Rate: 96 (01/19 0800)  Labs: Recent Labs    05/23/20 0022 05/24/20 0440 05/16/2020 0749  HGB  --   --  12.3*  HCT  --   --  39.4  PLT  --   --  231  LABPROT 19.0* 20.4* 23.9*  INR 1.7* 1.8* 2.2*    Estimated Creatinine Clearance: 75.4 mL/min (by C-G formula based on SCr of 0.75 mg/dL).  Assessment: 80 yo male with L symptomatic carotid stenosis.  S/p L carotid TCAR procedure w/ stenting that was complicated by postprocedure stroke.  MRI brain shows patchy L basal ganglia and L frontoparietal cortical and subcortical infarcts (new). No hemorrhagic transformation. New stroke and new onset atrial fibrillation. Previous reported hematuria but appears to have resolved. Patient on warfarin PTA for history DVT/PE. Pharmacy consulted to manage warfarin while inpatient.  Currently on aspirin, Plavix and warfarin (Lovenox). Plan is to stop Plavix on 1/22 and continue aspirin and warfarin only.  PTA warfarin dose: 2.5mg  daily except 1.25mg  on Mondays.  INR is 2.2, therapeutic today.    Lovenox discontinued today as discussed with Dr. Doristine Bosworth.   Discharge orders in today to SNF and discharge warfarin orders per MD resuming the PTA warfarin dose 2.5 mg daily except 1.25 mg every Monday.  He has required much higher doses of warfarin, likely due to change in nutrition, has been on tube feeds since 12/30.  I recommend to consider increasing discharge dose to 4mg  daily , and monitor INR closely until stable.   If tube feeding changes or stops, consider this when dosing/monitoring warfarin.   Goal of Therapy:   INR  goal 2-3  Monitor platelets by anticoagulation protocol: Yes    Plan:  Warfarin 4 mg today x1  Discontinued inpatient order for  Lovenox as discussed with Dr. Doristine Bosworth.  Consider increasing warfarin discharge dose to 4 mg or 5 mg daily and monitor INR closely at SNF until INR stable. Plavix to end 05/28/20  Inpatient Monitor daily INR inpatient, CBC, clinical course, s/sx of bleed, PO intake/diet, Drug-Drug Interactions   Thank you for allowing [place] to participate in this patients care.   Nicole Cella, RPh Clinical Pharmacist Please see amion for complete clinical pharmacist phone list. 05/31/2020 1:37 PM

## 2020-05-25 NOTE — Discharge Instructions (Signed)
[facility]  Discharge Instructions   Carotid Endarterectomy (CEA)  Please refer to the following instructions for your post-procedure care. Your surgeon or physician assistant will discuss any changes with you.  Activity  You are encouraged to walk as much as you can. You can slowly return to normal activities but must avoid strenuous activity and heavy lifting until your doctor tell you it's OK. Avoid activities such as vacuuming or swinging a golf club. You can drive after one week if you are comfortable and you are no longer taking prescription pain medications. It is normal to feel tired for serval weeks after your surgery. It is also normal to have difficulty with sleep habits, eating, and bowel movements after surgery. These will go away with time.  Bathing/Showering  You may shower after you come home. Do not soak in a bathtub, hot tub, or swim until the incision heals completely.  Incision Care  Shower every day. Clean your incision with mild soap and water. Pat the area dry with a clean towel. You do not need a bandage unless otherwise instructed. Do not apply any ointments or creams to your incision. You may have skin glue on your incision. Do not peel it off. It will come off on its own in about one week. Your incision may feel thickened and raised for several weeks after your surgery. This is normal and the skin will soften over time. For Men Only: It's OK to shave around the incision but do not shave the incision itself for 2 weeks. It is common to have numbness under your chin that could last for several months.  Diet  Resume your normal diet. There are no special food restrictions following this procedure. A low fat/low cholesterol diet is recommended for all patients with vascular disease. In order to heal from your surgery, it is CRITICAL to get adequate nutrition. Your body requires vitamins, minerals, and protein. Vegetables are the best  source of vitamins and minerals. Vegetables also provide the perfect balance of protein. Processed food has little nutritional value, so try to avoid this.        Medications  Resume taking all of your medications unless your doctor or physician assistant tells you not to. If your incision is causing pain, you may take over-the- counter pain relievers such as acetaminophen (Tylenol). If you were prescribed a stronger pain medication, please be aware these medications can cause nausea and constipation. Prevent nausea by taking the medication with a snack or meal. Avoid constipation by drinking plenty of fluids and eating foods with a high amount of fiber, such as fruits, vegetables, and grains. Do not take Tylenol if you are taking prescription pain medications.  Follow Up  Our office will schedule a follow up appointment 2-3 weeks following discharge.  Please call us immediately for any of the following conditions  Increased pain, redness, drainage (pus) from your incision site. Fever of 101 degrees or higher. If you should develop stroke (slurred speech, difficulty swallowing, weakness on one side of your body, loss of vision) you should call 911 and go to the nearest emergency room.  Reduce your risk of vascular disease:  Stop smoking. If you would like help call [facility] at 1-800-QUIT-NOW 938-084-3457) or [place] at [phone]. Manage your cholesterol Maintain a desired weight Control your diabetes Keep your blood pressure down  If you have any questions, please call the office at [phone].   Information on my medicine - Coumadin   (  Warfarin)  This medication education was reviewed with me or my healthcare representative as part of my discharge preparation.  Taking Warfarin (Coumadin) prior to this hospital admission.  Why was Coumadin prescribed for you? Coumadin was prescribed for you because you have a blood clot or a medical condition that can cause an  increased risk of forming blood clots. Blood clots can cause serious health problems by blocking the flow of blood to the heart, lung, or brain. Coumadin can prevent harmful blood clots from forming. As a reminder your indication for Coumadin is:   History of  Deep Vein Thrombosis Treatment past history of pulmonary embolus, deep vein thormbis (clot),  and for acute stroke (CVA).   What test will check on my response to Coumadin? While on Coumadin (warfarin) you will need to have an INR test regularly to ensure that your dose is keeping you in the desired range. The INR (international normalized ratio) number is calculated from the result of the laboratory test called prothrombin time (PT).  If an INR APPOINTMENT HAS NOT ALREADY BEEN MADE FOR YOU please schedule an appointment to have this lab work done by your health care provider within 7 days. Your INR goal is usually a number between:  2 to 3 or your provider may give you a more narrow range like 2-2.5.  Ask your health care provider during an office visit what your goal INR is.  What  do you need to  know  About  COUMADIN? Take Coumadin (warfarin) exactly as prescribed by your healthcare provider about the same time each day.  DO NOT stop taking without talking to the doctor who prescribed the medication.  Stopping without other blood clot prevention medication to take the place of Coumadin may increase your risk of developing a new clot or stroke.  Get refills before you run out.  What do you do if you miss a dose? If you miss a dose, take it as soon as you remember on the same day then continue your regularly scheduled regimen the next day.  Do not take two doses of Coumadin at the same time.  Important Safety Information A possible side effect of Coumadin (Warfarin) is an increased risk of bleeding. You should call your healthcare provider right away if you experience any of the following: ? Bleeding from an injury or your nose that does not  stop. ? Unusual colored urine (red or dark brown) or unusual colored stools (red or black). ? Unusual bruising for unknown reasons. ? A serious fall or if you hit your head (even if there is no bleeding).  Some foods or medicines interact with Coumadin (warfarin) and might alter your response to warfarin. To help avoid this: ? Eat a balanced diet, maintaining a consistent amount of Vitamin K. ? Notify your provider about major diet changes you plan to make. ? Avoid alcohol or limit your intake to 1 drink for women and 2 drinks for men per day. (1 drink is 5 oz. wine, 12 oz. beer, or 1.5 oz. liquor.)  Make sure that ANY health care provider who prescribes medication for you knows that you are taking Coumadin (warfarin).  Also make sure the healthcare provider who is monitoring your Coumadin knows when you have started a new medication including herbals and non-prescription products.  Coumadin (Warfarin)  Major Drug Interactions  Increased Warfarin Effect Decreased Warfarin Effect  Alcohol (large quantities) Antibiotics (esp. Septra/Bactrim, Flagyl, Cipro) Amiodarone (Cordarone) Aspirin (ASA) Cimetidine (Tagamet) Megestrol (Megace)  NSAIDs (ibuprofen, naproxen, etc.) Piroxicam (Feldene) Propafenone (Rythmol SR) Propranolol (Inderal) Isoniazid (INH) Posaconazole (Noxafil) Barbiturates (Phenobarbital) Carbamazepine (Tegretol) Chlordiazepoxide (Librium) Cholestyramine (Questran) Griseofulvin Oral Contraceptives Rifampin Sucralfate (Carafate) Vitamin K   Coumadin (Warfarin) Major Herbal Interactions  Increased Warfarin Effect Decreased Warfarin Effect  Garlic Ginseng Ginkgo biloba Coenzyme Q10 Green tea St. John's wort    Coumadin (Warfarin) FOOD Interactions  Eat a consistent number of servings per week of foods HIGH in Vitamin K (1 serving =  cup)  Collards (cooked, or boiled & drained) Kale (cooked, or boiled & drained) Mustard greens (cooked, or boiled &  drained) Parsley *serving size only =  cup Spinach (cooked, or boiled & drained) Swiss chard (cooked, or boiled & drained) Turnip greens (cooked, or boiled & drained)  Eat a consistent number of servings per week of foods MEDIUM-HIGH in Vitamin K (1 serving = 1 cup)  Asparagus (cooked, or boiled & drained) Broccoli (cooked, boiled & drained, or raw & chopped) Brussel sprouts (cooked, or boiled & drained) *serving size only =  cup Lettuce, raw (green leaf, endive, romaine) Spinach, raw Turnip greens, raw & chopped   These websites have more information on Coumadin (warfarin):  FailFactory.se; VeganReport.com.au;

## 2020-05-25 NOTE — Discharge Summary (Signed)
Physician Discharge Summary  John Lambert [id number] DOB: Sep 09, 1940 DOA: 04/19/2020  PCP: Harlan Stains, MD  Admit date: 04/19/2020 Discharge date: 05/24/2020  Admitted From: Home Disposition: SNF  Recommendations for Outpatient Follow-up:  1. Follow-up with PCP as an outpatient 2. Follow-up with neurology as an outpatient 3. Follow-up with urology as an outpatient 4. Take Coumadin 4 mg daily and check INR in 2 to 3 days.  Target INR is 2-3. 5. Foley catheter to remain in place at discharge.  Voiding trial can be done at the facility or at urology follow-up. 6. Speech therapy evaluation as an outpatient  Home Health: None Equipment/Devices: None Discharge Condition: Stable CODE STATUS: Full code Diet recommendation: Tube feedings  Brief/Interim Summary: John Lambert is a 80 y.o. 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 Patient presented to the ED on 12/14 with weakness, he was found to have scattered punctate left frontal cerebral infarctions. He was admitted for stroke work-up.  Found to have 80 to 99% stenosis of left ICA. 12/22, patient had left TCAR done after which she developed aphasia, left gaze deviation and right hemiparesis.  Code stroke was called in PACU.  He underwent emergent left cerebral angiogram but did not show any findings. Since then, he has had neurological deficits including dysphagia, right hemiparesis and right hemineglect. His hospital course was also complicated by UTI, urinary retention, hematuria, dysphagia requiring PEG tube placement and incidental nosocomial COVID-19 infection. Because of persistent dysphagia, patient underwent PEG tube placement by IR on 1/6. 1/7, incidental COVID. He was not sick from [place] but patient spent last 10 days in the hospital to complete COVID isolation before qualifying for discharge to SNF.  Acute CVA Left carotid  stenosis. -Presented with weakness of lower extremities for 24 hours, impaired ambulation -MRI findings as above showing left carotid atherosclerosis. -S/p left TCAR by Dr. Trula Slade on 12/22.  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 -Currently on aspirin, Plavix and Coumadin. -Plan is to stop Plavix on 1/22 to continue aspirin and Coumadin indefinitely. -Continue statin indefinitely.  COVID-19 infection -Patient only had mild symptoms.  Not on supplemental oxygen.  Chest x-ray clear.  Continue supportive care. -Off DC isolation since 1/17.  Essential hypertension -Currently blood pressure elevated controlled on Coreg only.  New diagnosis of paroxysmal A. Fib -Controlled on Coreg.  On Coumadin for anticoagulation due to pre-existing coagulopathy.    Blood clotting disorder, history of DVT/PE -On lifelong anticoagulation with Coumadin -Ultrasound duplex of lower extremities showed age-indeterminate, probably chronic DVT without symptoms. -PTA Coumadin dose: 2.5 mg daily except 1.25 mg on Mondays. -Due to subtherapeutic INR during hospitalization patient was placed on Lovenox for bridging.   -On 1/19: Patient's INR noted to be therapeutic at 2.2.  Lovenox was discontinued.  Discussed with the pharmacy recommended to discharge patient on Coumadin 4 mg daily.   Left thigh pain -Shooting pain, intermittent.  Continued Flexeril nightly as needed.  Hematuria Klebsiella UTI -Antibiotic course completed.  Urine clear.  Acute urinary retention BPH -Seen by urologist Dr. Diona Fanti on 05/04/2020.  Currently has an indwelling Foley catheter.  Discharge with Foley catheter.  Continue finasteride. Follow-up with Dr. Dayna Ramus as an outpatient.  Dysphagia -Status post PEG tube placement by IR on 1/6.  Continue tube feeding. -Continue speech therapy evaluation as an outpatient.  Discharge Diagnoses:  Acute CVA Left  carotid stenosis COVID-19 infection  Essential hypertension New diagnosis of paroxysmal A. fib Blood clotting disorder, history of DVT/PE Left thigh pain Hematuria/Klebsiella UTI Acute urinary retention/BPH Dysphagia  Discharge Instructions  Discharge Instructions    Ambulatory referral to Neurology   Complete by: As directed    Follow up with stroke clinic NP (Jessica Vanschaick or Cecille Rubin, if both not available, consider Zachery Dauer, or [place]) at [facility] in about 4 weeks. Thanks.   Diet - low sodium heart healthy   Complete by: As directed    Increase activity slowly   Complete by: As directed    Increase activity slowly   Complete by: As directed    No dressing needed   Complete by: As directed    No wound care   Complete by: As directed      Allergies as of 05/18/2020      Reactions   Hctz [hydrochlorothiazide] Other (See Comments)   dizzy   Hydrocodone Itching   Vicodin [hydrocodone-acetaminophen] Itching      Medication List    STOP taking these medications   lisinopril 20 MG tablet Commonly known as: ZESTRIL     TAKE these medications   acetaminophen 500 MG tablet Commonly known as: TYLENOL Take 500 mg by mouth every 6 (six) hours as needed for mild pain.   aspirin 81 MG chewable tablet Place 1 tablet (81 mg total) into feeding tube daily.   benzonatate 200 MG capsule Commonly known as: TESSALON Take 1 capsule (200 mg total) by mouth 3 (three) times daily as needed for cough.   carvedilol 25 MG tablet Commonly known as: COREG Take 25 mg by mouth 2 (two) times daily with a meal.   clopidogrel 75 MG tablet Commonly known as: PLAVIX Place 1 tablet (75 mg total) into feeding tube daily for 3 days.   cyclobenzaprine 5 MG tablet Commonly known as: FLEXERIL Place 1 tablet (5 mg total) into feeding tube at bedtime as needed for up to 5 days for muscle spasms.   feeding supplement (PROSource TF) liquid Place 45 mLs into feeding tube 2 (two) times  daily.   feeding supplement (JEVITY 1.5 CAL/FIBER) Liqd Place 55 mL/hr into feeding tube continuous for 3 days.   finasteride 5 MG tablet Commonly known as: PROSCAR Take 1 tablet (5 mg total) by mouth daily.   free water Soln Place 200 mLs into feeding tube every 6 (six) hours.   guaiFENesin-dextromethorphan 100-10 MG/5ML syrup Commonly known as: ROBITUSSIN DM Place 5 mLs into feeding tube every 6 (six) hours.   oxyCODONE 5 MG immediate release tablet Commonly known as: Oxy IR/ROXICODONE Place 1 tablet (5 mg total) into feeding tube every 6 (six) hours as needed for up to 5 days for severe pain.   pravastatin 20 MG tablet Commonly known as: PRAVACHOL Take 20 mg by mouth daily.   senna-docusate 8.6-50 MG tablet Commonly known as: Senokot-S Place 1 tablet into feeding tube at bedtime.   warfarin 4 MG tablet Commonly known as: COUMADIN Place 1 tablet (4 mg total) into feeding tube daily. What changed:   medication strength  how much to take  how to take this  when to take this  additional instructions            Discharge Care Instructions  (From admission, onward)         Start     Ordered   05/24/20 0000  No dressing needed        05/24/20 1257  Contact information for follow-up providers    [facility]. Schedule an appointment as soon as possible for a visit in 4 week(s).   Specialty: Neurology Contact information: [address] [place] [phone]       Serafina Mitchell, MD In 2 weeks.   Specialties: Vascular Surgery, Cardiology Why: Office will call you to arrange your appt (sent) Contact information: [place] [address] [phone]        Harlan Stains, MD Follow up.   Specialty: Family Medicine Contact information: [place] [address] [phone]        Franchot Gallo, MD Follow up.   Specialty:  Urology Contact information: [place] [address] [phone]            Contact information for after-discharge care    Destination    [facility] .   Service: Skilled Nursing Contact information: [address] [place] [phone]                 Allergies  Allergen Reactions  . Hctz [Hydrochlorothiazide] Other (See Comments)    dizzy  . Hydrocodone Itching  . Vicodin [Hydrocodone-Acetaminophen] Itching    Consultations:  Urology  Urology   Procedures/Studies: CT ABDOMEN PELVIS WO CONTRAST  Result Date: 05/04/2020 CLINICAL DATA:  Shortness of breath. Evaluate for pneumonia. Hematuria. EXAM: CT CHEST, ABDOMEN AND PELVIS WITHOUT CONTRAST TECHNIQUE: Multidetector CT imaging of the chest, abdomen and pelvis was performed following the standard protocol without IV contrast. COMPARISON:  None. FINDINGS: CT CHEST FINDINGS Cardiovascular: No pericardial effusion. No thoracic aortic aneurysm. Mild atherosclerosis of the thoracic aorta. Three-vessel coronary artery calcifications. Mediastinum/Nodes: No mass or enlarged lymph nodes are seen within the mediastinum or perihilar regions. Enteric tube passes to the stomach. Trachea is unremarkable. Lungs/Pleura: Patchy mild scarring/fibrosis within each lung. No consolidation, pleural effusion or pneumothorax. Musculoskeletal: No acute or suspicious osseous finding. Degenerative spondylosis of the kyphotic and slightly scoliotic thoracic spine, mild to moderate in degree. CT ABDOMEN PELVIS FINDINGS Hepatobiliary: No focal liver abnormality is seen. Gallbladder is unremarkable. No bile duct dilatation is seen. Pancreas: Unremarkable. Fluid is seen adjacent to the pancreatic tail, of uncertain origin. Spleen: Normal in size without focal abnormality. Adrenals/Urinary Tract: Adrenal glands are unremarkable. Asymmetrically prominent fluid/inflammation about the LEFT kidney, including  thickening of the anterior perinephric fascia. No hydronephrosis of the LEFT kidney. Perhaps mild pelviectasis of the RIGHT kidney without frank hydronephrosis. Bilateral hydroureter, mild to moderate in degree. No renal or ureteral calculi. Bladder is decompressed by Foley catheter. No ureteral calculi seen. Stomach/Bowel: No dilated large or small bowel loops. Appendix is normal. Stomach is decompressed by a nasogastric tube. Scattered diverticulosis of the sigmoid and descending colon. Vascular/Lymphatic: Mild atherosclerosis of the normal caliber abdominal aorta. Chronic varices within the subcutaneous soft tissues of the lower pelvis. Incidental note of a retroaortic LEFT renal vein. No enlarged lymph nodes are identified. Reproductive: Prostate is unremarkable. Other: Small amount of free fluid in the LEFT upper quadrant, most prominently seen between the pancreatic tail and LEFT kidney. No abscess-like collection is identified. No free intraperitoneal air. Musculoskeletal: Degenerative spondylosis of the slightly scoliotic lumbar spine, moderate in degree. No acute appearing osseous abnormality. Ill-defined fluid/edema within the subcutaneous soft tissues of the abdomen and pelvis indicating anasarca. IMPRESSION: 1. Ill-defined fluid/inflammation centered about the pancreatic tail and just anterior to the LEFT kidney. This fluid/inflammation  is of uncertain origin. Differential considerations include acute pancreatitis and pyelonephritis. Alternatively, this may represent localized simple ascites. Recommend correlation with pancreatic lab values and urinalysis. 2. No hydronephrosis of either kidney. No renal or ureteral calculi identified. 3. No evidence of pneumonia. Mild scarring/fibrotic change within each lung. 4. Three-vessel coronary artery calcifications. 5. Anasarca. Aortic Atherosclerosis (ICD10-I70.0). Electronically Signed   By: Franki Cabot M.D.   On: 05/04/2020 11:34   CT Code Stroke CTA Head  W/WO contrast  Result Date: 04/27/2020 CLINICAL DATA:  Flaccid right-side EXAM: CT ANGIOGRAPHY HEAD AND NECK TECHNIQUE: Multidetector CT imaging of the head and neck was performed using the standard protocol during bolus administration of intravenous contrast. Multiplanar CT image reconstructions and MIPs were obtained to evaluate the vascular anatomy. Carotid stenosis measurements (when applicable) are obtained utilizing NASCET criteria, using the distal internal carotid diameter as the denominator. CONTRAST:  19mL OMNIPAQUE IOHEXOL 350 MG/ML SOLN COMPARISON:  Head CT from earlier today. CTA of the head neck from 7 days ago FINDINGS: CTA NECK FINDINGS Aortic arch: Atherosclerosis with 3 vessel branching. Right carotid system: Limited atheromatous changes. No stenosis or ulceration. Left carotid system: Trans carotid revascularization with stent extending from distal common to proximal internal carotid. There is related adjacent soft tissue gas from the surgical access. Wasting of the stent which measures 50% stenosis compared to the more distal stent, per coronal reformats. No ulceration, dissection, or thrombosis seen. Vertebral arteries: No proximal subclavian stenosis. The vertebral arteries are diffusely patent Skeleton: Ordinary degenerative and spondylitic spurring. Other neck: Soft tissue gas related to surgical access. There is medial ization of the left vocal fold. Upper chest: Mild reticulation in the upper lungs with stability favoring scarring. Review of the MIP images confirms the above findings CTA HEAD FINDINGS Anterior circulation: Carotid siphon atheromatous calcification. Aplastic right A1 segment. No major branch occlusion, beading, or aneurysm. Mild atheromatous irregularity to bilateral medium size vessels. Posterior circulation: Mild left vertebral artery dominance. Generalized atheromatous irregularity of the vertebral and basilar arteries with 40% stenosis of the left V4 segment as  measured on coronal reformats. Fetal type right PCA. No branch occlusion, beading, or aneurysm Venous sinuses: Unremarkable in the arterial phase Anatomic variants: As above Review of the MIP images confirms the above findings IMPRESSION: 1. Left trans carotid revascularization. The stent is patent with no visible embolic disease to the major intracranial vessels. There is wasting of the stent at the level of treated plaque, measuring approximately 50%. 2. Moderate left V4 segment stenosis. 3. Medial ization of the left vocal fold, please correlate for clinical signs of paresis. Electronically Signed   By: Monte Fantasia M.D.   On: 04/27/2020 10:46   CT Code Stroke CTA Neck W/WO contrast  Result Date: 04/27/2020 CLINICAL DATA:  Flaccid right-side EXAM: CT ANGIOGRAPHY HEAD AND NECK TECHNIQUE: Multidetector CT imaging of the head and neck was performed using the standard protocol during bolus administration of intravenous contrast. Multiplanar CT image reconstructions and MIPs were obtained to evaluate the vascular anatomy. Carotid stenosis measurements (when applicable) are obtained utilizing NASCET criteria, using the distal internal carotid diameter as the denominator. CONTRAST:  30mL OMNIPAQUE IOHEXOL 350 MG/ML SOLN COMPARISON:  Head CT from earlier today. CTA of the head neck from 7 days ago FINDINGS: CTA NECK FINDINGS Aortic arch: Atherosclerosis with 3 vessel branching. Right carotid system: Limited atheromatous changes. No stenosis or ulceration. Left carotid system: Trans carotid revascularization with stent extending from distal common to proximal internal carotid. There  is related adjacent soft tissue gas from the surgical access. Wasting of the stent which measures 50% stenosis compared to the more distal stent, per coronal reformats. No ulceration, dissection, or thrombosis seen. Vertebral arteries: No proximal subclavian stenosis. The vertebral arteries are diffusely patent Skeleton: Ordinary  degenerative and spondylitic spurring. Other neck: Soft tissue gas related to surgical access. There is medial ization of the left vocal fold. Upper chest: Mild reticulation in the upper lungs with stability favoring scarring. Review of the MIP images confirms the above findings CTA HEAD FINDINGS Anterior circulation: Carotid siphon atheromatous calcification. Aplastic right A1 segment. No major branch occlusion, beading, or aneurysm. Mild atheromatous irregularity to bilateral medium size vessels. Posterior circulation: Mild left vertebral artery dominance. Generalized atheromatous irregularity of the vertebral and basilar arteries with 40% stenosis of the left V4 segment as measured on coronal reformats. Fetal type right PCA. No branch occlusion, beading, or aneurysm Venous sinuses: Unremarkable in the arterial phase Anatomic variants: As above Review of the MIP images confirms the above findings IMPRESSION: 1. Left trans carotid revascularization. The stent is patent with no visible embolic disease to the major intracranial vessels. There is wasting of the stent at the level of treated plaque, measuring approximately 50%. 2. Moderate left V4 segment stenosis. 3. Medial ization of the left vocal fold, please correlate for clinical signs of paresis. Electronically Signed   By: Monte Fantasia M.D.   On: 04/27/2020 10:46   CT CHEST WO CONTRAST  Result Date: 05/04/2020 CLINICAL DATA:  Shortness of breath. Evaluate for pneumonia. Hematuria. EXAM: CT CHEST, ABDOMEN AND PELVIS WITHOUT CONTRAST TECHNIQUE: Multidetector CT imaging of the chest, abdomen and pelvis was performed following the standard protocol without IV contrast. COMPARISON:  None. FINDINGS: CT CHEST FINDINGS Cardiovascular: No pericardial effusion. No thoracic aortic aneurysm. Mild atherosclerosis of the thoracic aorta. Three-vessel coronary artery calcifications. Mediastinum/Nodes: No mass or enlarged lymph nodes are seen within the mediastinum or  perihilar regions. Enteric tube passes to the stomach. Trachea is unremarkable. Lungs/Pleura: Patchy mild scarring/fibrosis within each lung. No consolidation, pleural effusion or pneumothorax. Musculoskeletal: No acute or suspicious osseous finding. Degenerative spondylosis of the kyphotic and slightly scoliotic thoracic spine, mild to moderate in degree. CT ABDOMEN PELVIS FINDINGS Hepatobiliary: No focal liver abnormality is seen. Gallbladder is unremarkable. No bile duct dilatation is seen. Pancreas: Unremarkable. Fluid is seen adjacent to the pancreatic tail, of uncertain origin. Spleen: Normal in size without focal abnormality. Adrenals/Urinary Tract: Adrenal glands are unremarkable. Asymmetrically prominent fluid/inflammation about the LEFT kidney, including thickening of the anterior perinephric fascia. No hydronephrosis of the LEFT kidney. Perhaps mild pelviectasis of the RIGHT kidney without frank hydronephrosis. Bilateral hydroureter, mild to moderate in degree. No renal or ureteral calculi. Bladder is decompressed by Foley catheter. No ureteral calculi seen. Stomach/Bowel: No dilated large or small bowel loops. Appendix is normal. Stomach is decompressed by a nasogastric tube. Scattered diverticulosis of the sigmoid and descending colon. Vascular/Lymphatic: Mild atherosclerosis of the normal caliber abdominal aorta. Chronic varices within the subcutaneous soft tissues of the lower pelvis. Incidental note of a retroaortic LEFT renal vein. No enlarged lymph nodes are identified. Reproductive: Prostate is unremarkable. Other: Small amount of free fluid in the LEFT upper quadrant, most prominently seen between the pancreatic tail and LEFT kidney. No abscess-like collection is identified. No free intraperitoneal air. Musculoskeletal: Degenerative spondylosis of the slightly scoliotic lumbar spine, moderate in degree. No acute appearing osseous abnormality. Ill-defined fluid/edema within the subcutaneous soft  tissues of the abdomen  and pelvis indicating anasarca. IMPRESSION: 1. Ill-defined fluid/inflammation centered about the pancreatic tail and just anterior to the LEFT kidney. This fluid/inflammation is of uncertain origin. Differential considerations include acute pancreatitis and pyelonephritis. Alternatively, this may represent localized simple ascites. Recommend correlation with pancreatic lab values and urinalysis. 2. No hydronephrosis of either kidney. No renal or ureteral calculi identified. 3. No evidence of pneumonia. Mild scarring/fibrotic change within each lung. 4. Three-vessel coronary artery calcifications. 5. Anasarca. Aortic Atherosclerosis (ICD10-I70.0). Electronically Signed   By: Franki Cabot M.D.   On: 05/04/2020 11:34   MR BRAIN WO CONTRAST  Result Date: 05/05/2020 CLINICAL DATA:  Stroke, follow up EXAM: MRI HEAD WITHOUT CONTRAST TECHNIQUE: Multiplanar, multiecho pulse sequences of the brain and surrounding structures were obtained without intravenous contrast. COMPARISON:  04/28/2020 and prior. FINDINGS: Please note that limited sequence acquisition limits evaluation. Brain: Mild cerebral atrophy with ex vacuo dilatation. No midline shift, ventriculomegaly or extra-axial fluid collection. No intracranial hemorrhage. Redemonstration of multifocal subacute infarcts involving the left cerebrum, unchanged. Tiny DWI hyperintensity involving the right caudate head on prior exam is not demonstrated. No new acute/subacute infarct. Multifocal remote insults are better demonstrated on prior exam. Vascular: Not well evaluated. Skull and upper cervical spine: No focal abnormality. Sinuses/Orbits: Not well evaluated. Other: None. IMPRESSION: Multifocal subacute left cerebral infarcts, unchanged. No new infarct or intracranial hemorrhage. Prior punctate right caudate head DWI hyperintensity is not demonstrated. Electronically Signed   By: Primitivo Gauze M.D.   On: 05/05/2020 15:25   MR BRAIN WO  CONTRAST  Result Date: 04/28/2020 CLINICAL DATA:  Left carotid stent placed for stenosis. EXAM: MRI HEAD WITHOUT CONTRAST TECHNIQUE: Multiplanar, multiecho pulse sequences of the brain and surrounding structures were obtained without intravenous contrast. COMPARISON:  04/27/2020.  MRI 04/20/2020. FINDINGS: Brain: No acute finding affects the brainstem or cerebellum. Few old small vessel cerebellar infarctions on the left. Old small vessel infarctions of the pons. Right cerebral hemisphere shows atrophy with chronic small-vessel ischemic changes of the white matter. Old small right frontal vertex cortical and subcortical infarction. Left hemisphere shows areas of subacute infarction at the medial left frontoparietal vertex. New areas of acute infarction are present scattered within the basal ganglia and radiating white matter tracts and within the left frontoparietal cortical and subcortical brain. No large confluent infarction. No swelling or hemorrhage. Chronic small-vessel ischemic changes seen elsewhere. No hydrocephalus or extra-axial collection. Vascular: Major vessels at the base of the brain show flow. Skull and upper cervical spine: Negative Sinuses/Orbits: Clear/normal Other: None IMPRESSION: 1. New areas of acute infarction are present scattered within the left basal ganglia and radiating white matter tracts and within the left frontoparietal cortical and subcortical brain. No large confluent infarction. No swelling or hemorrhage. 2. Subacute infarction in the left medial frontoparietal vertex undergoing expected evolutionary changes. 3. Chronic small-vessel ischemic changes elsewhere throughout the brain as outlined above. Electronically Signed   By: Nelson Chimes M.D.   On: 04/28/2020 12:24   US SCROTUM  Result Date: 05/05/2020 CLINICAL DATA:  80 year old with scrotal swelling. EXAM: ULTRASOUND OF SCROTUM TECHNIQUE: Complete ultrasound examination of the testicles, epididymis, and other scrotal  structures was performed. COMPARISON:  None. FINDINGS: Right testicle Measurements: 4.2 x 2.1 x 2.9 cm. Rete tubular ectasia with small cysts at the mediastinum testis. No solid intra testicular mass. Normal blood flow. Left testicle Measurements: 3.3 x 1.7 x 2.5 cm. Testicular echotexture is heterogeneous. Ill-defined area of heterogeneously decreased echogenicity in the lateral testis without well-defined borders measures  approximately 1.2 x 0.8 x 1.3 cm. Similar internal vascularity to the adjacent testicular parenchyma. There is an intratesticular cyst laterally measuring 6 mm. Normal parenchymal blood flow. Right epididymis: Tiny epididymal head cyst. There is also a tiny cyst in the mid epididymal body. No solid lesion or hyperemia. Left epididymis:  Epididymal head cyst measuring 8 mm. Hydrocele:  Small bilaterally, right greater than left. Varicocele:  Present bilaterally. Other: Bilateral scrotal skin thickening. No subcutaneous collection. IMPRESSION: 1. Bilateral scrotal skin thickening. 2. Right rete tubular ectasia. 3. There is an intra testicular ill-defined heterogeneously hypoechoic area in the left lateral testicle measuring approximately 1.3 cm with some internal blood flow. This has nonspecific imaging features. Differential considerations include unusual appearance of rete testis versus intra testicular mass. Presence of internal blood flow favors against testicular hematoma or segmental infarct. Recommend urologic consultation and potentially MRI for more detailed evaluation. There is also a subcentimeter left intra testicular cyst. 4. Small bilateral hydroceles.  Bilateral varicoceles. 5. Bilateral epididymal head cysts. Electronically Signed   By: Keith Rake M.D.   On: 05/05/2020 19:08   IR GASTROSTOMY TUBE MOD SED  Result Date: 05/12/2020 INDICATION: 80 year old with cerebral infarcts and dysphagia. EXAM: PERCUTANEOUS GASTROSTOMY TUBE WITH FLUOROSCOPIC GUIDANCE Physician: Stephan Minister.  Henn, MD MEDICATIONS: Ancef 2 g, glucagon 1 mg ANESTHESIA/SEDATION: Versed 2.0 mg IV; Fentanyl 50 mcg IV Moderate Sedation Time:  18 minutes The patient was continuously monitored during the procedure by the interventional radiology nurse under my direct supervision. FLUOROSCOPY TIME:  Fluoroscopy Time: 5 minutes, 42 seconds, 30 mGy COMPLICATIONS: None immediate. PROCEDURE: Informed consent was obtained for a percutaneous gastrostomy tube. The patient was placed on the interventional table. Fluoroscopy demonstrated oral contrast in the transverse colon. An orogastric tube was placed with fluoroscopic guidance. The anterior abdomen was prepped and draped in sterile fashion. Maximal barrier sterile technique was utilized including caps, mask, sterile gowns, sterile gloves, sterile drape, hand hygiene and skin antiseptic. Stomach was inflated with air through the orogastric tube. The skin and subcutaneous tissues were anesthetized with 1% lidocaine. A 17 gauge needle was directed into the distended stomach with fluoroscopic guidance. A wire was advanced into the stomach. A 9-French vascular sheath was placed and the orogastric tube was snared using a Gooseneck snare device. The orogastric tube and snare were pulled out of the patient's mouth. The snare device was connected to a 20-French gastrostomy tube. The snare device and gastrostomy tube were pulled through the patient's mouth and out the anterior abdominal wall. The gastrostomy tube was cut to an appropriate length. Contrast injection through gastrostomy tube confirmed placement within the stomach. Fluoroscopic images were obtained for documentation. The gastrostomy tube was flushed with normal saline. IMPRESSION: Successful fluoroscopic guided percutaneous gastrostomy tube placement. Electronically Signed   By: Markus Daft M.D.   On: 05/12/2020 16:00   US RENAL  Result Date: 05/03/2020 CLINICAL DATA:  Acute renal injury. EXAM: RENAL / URINARY TRACT  ULTRASOUND COMPLETE COMPARISON:  No recent. FINDINGS: Right Kidney: Renal measurements: 11.8 x 5.4 x 5.4 cm = volume: 178.8 mL. Increased echogenicity. No mass. Mild prominence of the right renal pelvis noted. Left Kidney: Renal measurements: 12.5 x 6.6 x 5.9 cm = volume: 252.8 mL. Increased echogenicity. No mass or hydronephrosis visualized. Bladder: Bladder is distended and contains debris. Bladder infection could present this fashion. Bilateral ureteral jets visualized. Prevoid volume 951.9 cc. Other: None. IMPRESSION: 1. Bladder is distended and contains debris. Bladder infection could present this fashion. Prevoid volume  951.9 CC. Mild prominence right renal pelvis noted. 2. Increased echogenicity both kidneys consistent with chronic medical renal disease. Electronically Signed   By: [place]   On: 05/03/2020 12:30   DG CHEST PORT 1 VIEW  Result Date: 05/14/2020 CLINICAL DATA:  Dyspnea. EXAM: PORTABLE CHEST 1 VIEW COMPARISON:  Radiograph 05/09/2020.  CT 05/04/2020 FINDINGS: Low lung volumes. Normal heart size with stable mediastinal contours allowing for anti lordotic technique. Bibasilar atelectasis. No confluent consolidation. No pleural fluid or pneumothorax. No pulmonary edema. No acute osseous abnormalities are seen. IMPRESSION: Low lung volumes with bibasilar atelectasis. Electronically Signed   By: Keith Rake M.D.   On: 05/14/2020 00:58   DG CHEST PORT 1 VIEW  Result Date: 05/09/2020 CLINICAL DATA:  Shortness of breath, cough EXAM: PORTABLE CHEST 1 VIEW COMPARISON:  Portable exam 0929 hours compared to 05/08/2020 FINDINGS: Feeding tube extends into stomach. Normal heart size, mediastinal contours, and pulmonary vascularity. No definite acute infiltrate, pleural effusion or pneumothorax. Minimal bibasilar atelectasis noted. Osseous structures unremarkable. IMPRESSION: Minimal bibasilar atelectasis. Electronically Signed   By: Lavonia Dana M.D.   On: 05/09/2020 09:39   DG CHEST PORT 1  VIEW  Result Date: 05/08/2020 CLINICAL DATA:  Cough. EXAM: PORTABLE CHEST 1 VIEW COMPARISON:  May 05, 2020. FINDINGS: The heart size and mediastinal contours are within normal limits. Both lungs are clear. The visualized skeletal structures are unremarkable. IMPRESSION: No active disease. Electronically Signed   By: Marijo Conception M.D.   On: 05/08/2020 16:21   DG CHEST PORT 1 VIEW  Result Date: 05/05/2020 CLINICAL DATA:  Shortness of breath. EXAM: PORTABLE CHEST 1 VIEW COMPARISON:  CT chest, abdomen and pelvis and single view of the chest 05/04/2020. FINDINGS: Lungs are clear. Heart size is normal. No pneumothorax or pleural effusion. IMPRESSION: No acute disease. Electronically Signed   By: Inge Rise M.D.   On: 05/05/2020 10:37   DG CHEST PORT 1 VIEW  Result Date: 05/04/2020 CLINICAL DATA:  Shortness of breath EXAM: PORTABLE CHEST 1 VIEW COMPARISON:  Chest x-ray dated 01/10/2006 FINDINGS: Heart size is normal. Masslike density at the RIGHT hilum. Diffuse interstitial prominence, presumably edema. Additional bilateral perihilar and basilar opacities. No pleural effusion or pneumothorax is seen. Osseous structures about the chest are unremarkable. Enteric tube passes below the diaphragm. IMPRESSION: 1. Masslike density at the RIGHT hilum, suspicious for neoplastic mass or lymphadenopathy, less likely perihilar pneumonia. Recommend chest CT for further characterization. 2. Diffuse bilateral interstitial prominence, presumably some degree of edema/volume overload. 3. Additional patchy opacities within the bilateral perihilar and basilar regions, pneumonia versus pulmonary edema, alternatively pleural plaques related to previous asbestos exposure. 4. Enteric tube passes below the diaphragm. Electronically Signed   By: Franki Cabot M.D.   On: 05/04/2020 08:22   DG Swallowing Func-Speech Pathology  Result Date: 05/24/2020 Objective Swallowing Evaluation: Type of Study: MBS-Modified Barium  Swallow Study  Patient Details Name: John Lambert MRN: [id number] Date of Birth: [birth year] Today's Date: 05/24/2020 Time: SLP Start Time (ACUTE ONLY): 1408 -SLP Stop Time (ACUTE ONLY): 1450 SLP Time Calculation (min) (ACUTE ONLY): 42 min Past Medical History: Past Medical History: Diagnosis Date . Blood clotting disorder ([place])  . Blood dyscrasia  . Cancer ([place])   melanoma removed from right calf . Hypertension  . Neuromuscular disorder ([place])   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 Past Surgical History: Past Surgical History: Procedure Laterality Date . IR ANGIO INTRA EXTRACRAN SEL COM CAROTID INNOMINATE UNI L MOD SED  04/27/2020 .  IR GASTROSTOMY TUBE MOD SED  05/12/2020 . RADIOLOGY WITH ANESTHESIA N/A 04/27/2020  Procedure: IR WITH ANESTHESIA;  Surgeon: Radiologist, Medication, MD;  Location: [place];  Service: Radiology;  Laterality: N/A; . SKIN BIOPSY   . TRANSCAROTID ARTERY REVASCULARIZATION Left 04/27/2020  Procedure: LEFT TRANSCAROTID ARTERY REVASCULARIZATION;  Surgeon: Serafina Mitchell, MD;  Location: [place];  Service: Vascular;  Laterality: Left; . ULTRASOUND GUIDANCE FOR VASCULAR ACCESS Left 04/27/2020  Procedure: ULTRASOUND GUIDANCE FOR VASCULAR ACCESS;  Surgeon: Serafina Mitchell, MD;  Location: [facility];  Service: Vascular;  Laterality: Left; HPI: Pt is an 80 y.o. 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 He also has pain in left upper leg/hip region, and R foot drag; Imaging notable for Numerous scattered punctate acute infarctions in the left  posterior frontal region consistent with micro embolic infarctions  which could either be in the anterior or middle cerebral artery  territory. Punctate acute infarction also affecting the splenium of  the corpus callosum. Pt was seen by SLP on 12/17 for a BSE and SLE with no subsequent need for SLP services.  Left carotid TCAR procedure with stent placement conducted on 12/22. Following the procedure he was found to be aphasic unable to speak with left gaze deviation and right hemiparesis. Emergent CTA showed patent left carotid stent with good flow in the terminal left ICA as well as middle cerebral artery in the M1 segment. There appeared to be decreased flow in one of the M2 branches by neurology. SLP was consulted due to pt failing the [place], but a failed swallow screen has not been documented in EMR. EEG 12/22: No seizures. MRI 12/23: New areas of acute infarction are present scattered within the  left basal ganglia and radiating white matter tracts and within the  left frontoparietal cortical and subcortical brain.EEG 12/30: cortical dysfunction in left hemisphere, maximal left fronto-temporal region likely secondary to underlying stroke/structural abnormality. No seizures  Subjective: alert, not very verbally communicative but responding with gesture Assessment / Plan / Recommendation CHL IP CLINICAL IMPRESSIONS 05/24/2020 Clinical Impression Pt presents with mild improvements in swallowing function compared to most recent MBS on 05/04/20. He continues to have reduced lingual propulsion, leaving behind mild lingual residue that he often clears with a spontaneous second swallow. Timing is good for swallow initiation but he has reduced anterior hyolaryngeal movement, pharyngeal squeeze, base of tongue retraction, and epiglottic inversion. He has intermittent penetration and aspiration across consistencies, but more so with liquids. While he did not cough during PO trials, he did have a cough after testing that was quite delayed if related to aspiration. Penetration and aspiration both occur in small volumes, but aren't cleared well. He also has residue that almost completely fills his valleculae and cannot be fully cleared. Recommend that he continue to receive primary nutrition via alternative means  (pt has a PEG), but recommend ongoing SLP services with consideration for introduction of therapeutic bites of puree. SLP Visit Diagnosis Dysphagia, oropharyngeal phase (R13.12);Aphasia (R47.01) Attention and concentration deficit following -- Frontal lobe and executive function deficit following -- Impact on safety and function Moderate aspiration risk;Risk for inadequate nutrition/hydration   CHL IP TREATMENT RECOMMENDATION 05/24/2020 Treatment Recommendations Therapy as outlined in treatment plan below   Prognosis 05/24/2020 Prognosis for Safe Diet Advancement Good Barriers to Reach Goals Severity of deficits Barriers/Prognosis Comment --  CHL IP DIET RECOMMENDATION 05/24/2020 SLP Diet Recommendations NPO;Alternative means - long-term Liquid Administration via -- Medication Administration Via alternative means Compensations -- Postural Changes --   CHL IP OTHER RECOMMENDATIONS 05/24/2020 Recommended Consults -- Oral Care Recommendations Oral care QID Other Recommendations --   CHL IP FOLLOW UP RECOMMENDATIONS 05/24/2020 Follow up Recommendations Skilled Nursing facility   [facility] IP FREQUENCY AND DURATION 05/24/2020 Speech Therapy Frequency (ACUTE ONLY) min 2x/week Treatment Duration 2 weeks      CHL IP ORAL PHASE 05/24/2020 Oral Phase Impaired Oral - Pudding Teaspoon -- Oral - Pudding Cup -- Oral - Honey Teaspoon Lingual/palatal residue;Reduced posterior propulsion Oral - Honey Cup NT Oral - Nectar Teaspoon Lingual/palatal residue;Reduced posterior propulsion Oral - Nectar Cup NT Oral - Nectar Straw NT Oral - Thin Teaspoon -- Oral - Thin Cup -- Oral - Thin Straw NT Oral - Puree Lingual/palatal residue;Reduced posterior propulsion;Delayed oral transit Oral - Mech Soft -- Oral - Regular -- Oral - Multi-Consistency -- Oral - Pill -- Oral Phase - Comment --  CHL IP PHARYNGEAL PHASE 05/24/2020 Pharyngeal Phase Impaired Pharyngeal- Pudding Teaspoon -- Pharyngeal -- Pharyngeal- Pudding Cup -- Pharyngeal -- Pharyngeal- Honey  Teaspoon Reduced pharyngeal peristalsis;Reduced epiglottic inversion;Reduced anterior laryngeal mobility;Reduced airway/laryngeal closure;Reduced tongue base retraction;Pharyngeal residue - valleculae;Penetration/Aspiration during swallow Pharyngeal Material enters airway, passes BELOW cords without attempt by patient to eject out (silent aspiration) Pharyngeal- Honey Cup NT Pharyngeal -- Pharyngeal- Nectar Teaspoon Reduced pharyngeal peristalsis;Reduced epiglottic inversion;Reduced anterior laryngeal mobility;Reduced airway/laryngeal closure;Reduced tongue base retraction;Pharyngeal residue - valleculae;Penetration/Aspiration during swallow Pharyngeal Material enters airway, passes BELOW cords without attempt by patient to eject out (silent aspiration) Pharyngeal- Nectar Cup NT Pharyngeal -- Pharyngeal- Nectar Straw NT Pharyngeal -- Pharyngeal- Thin Teaspoon -- Pharyngeal -- Pharyngeal- Thin Cup -- Pharyngeal -- Pharyngeal- Thin Straw NT Pharyngeal -- Pharyngeal- Puree Reduced pharyngeal peristalsis;Reduced epiglottic inversion;Reduced anterior laryngeal mobility;Reduced airway/laryngeal closure;Reduced tongue base retraction;Pharyngeal residue - valleculae;Penetration/Aspiration during swallow Pharyngeal Material enters airway, passes BELOW cords without attempt by patient to eject out (silent aspiration) Pharyngeal- Mechanical Soft -- Pharyngeal -- Pharyngeal- Regular -- Pharyngeal -- Pharyngeal- Multi-consistency -- Pharyngeal -- Pharyngeal- Pill -- Pharyngeal -- Pharyngeal Comment --  CHL IP CERVICAL ESOPHAGEAL PHASE 05/24/2020 Cervical Esophageal Phase Impaired Pudding Teaspoon -- Pudding Cup -- Honey Teaspoon Reduced cricopharyngeal relaxation Honey Cup -- Nectar Teaspoon Reduced cricopharyngeal relaxation Nectar Cup -- Nectar Straw -- Thin Teaspoon -- Thin Cup -- Thin Straw -- Puree Reduced cricopharyngeal relaxation Mechanical Soft -- Regular -- Multi-consistency -- Pill -- Cervical Esophageal Comment --  Osie Bond., M.A. CCC-SLP Acute Rehabilitation Services Pager [phone] Office [phone] 05/24/2020, 3:45 PM              DG Swallowing Func-Speech Pathology  Result Date: 05/10/2020 Please refer to "Notes" tab for Speech Pathology notes.  DG Swallowing Func-Speech Pathology  Result Date: 05/04/2020 Objective Swallowing Evaluation: Type of Study: MBS-Modified Barium Swallow Study  Patient Details Name: John Lambert MRN: [id number] Date of Birth: 10/12/1940 Today's Date: 05/04/2020 Time: SLP Start Time (ACUTE ONLY): [id number] -SLP Stop Time (ACUTE ONLY): 0853 SLP Time Calculation (min) (ACUTE ONLY): 18 min Past Medical History: Past Medical History: Diagnosis Date . Blood clotting disorder ([place])  . Blood dyscrasia  . Cancer ([place])   melanoma removed from right calf . Hypertension  . Neuromuscular disorder ([place])   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 Past Surgical History: Past Surgical History: Procedure Laterality Date . IR ANGIO INTRA EXTRACRAN SEL COM CAROTID INNOMINATE UNI L MOD SED  04/27/2020 . RADIOLOGY  WITH ANESTHESIA N/A 04/27/2020  Procedure: IR WITH ANESTHESIA;  Surgeon: Radiologist, Medication, MD;  Location: [place];  Service: Radiology;  Laterality: N/A; . SKIN BIOPSY   . TRANSCAROTID ARTERY REVASCULARIZATION Left 04/27/2020  Procedure: LEFT TRANSCAROTID ARTERY REVASCULARIZATION;  Surgeon: Serafina Mitchell, MD;  Location: [place];  Service: Vascular;  Laterality: Left; . ULTRASOUND GUIDANCE FOR VASCULAR ACCESS Left 04/27/2020  Procedure: ULTRASOUND GUIDANCE FOR VASCULAR ACCESS;  Surgeon: Serafina Mitchell, MD;  Location[facility];  Service: Vascular;  Laterality: Left; HPI: Pt is an 80 y.o. 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 He also has pain in left upper leg/hip region, and R foot drag; Imaging notable for Numerous scattered punctate  acute infarctions in the left  posterior frontal region consistent with micro embolic infarctions  which could either be in the anterior or middle cerebral artery  territory. Punctate acute infarction also affecting the splenium of  the corpus callosum. Pt was seen by SLP on 12/17 for a BSE and SLE with no subsequent need for SLP services. Left carotid TCAR procedure with stent placement conducted on 12/22. Following the procedure he was found to be aphasic unable to speak with left gaze deviation and right hemiparesis. Emergent CTA showed patent left carotid stent with good flow in the terminal left ICA as well as middle cerebral artery in the M1 segment. There appeared to be decreased flow in one of the M2 branches by neurology. SLP was consulted due to pt failing the [place], but a failed swallow screen has not been documented in EMR. EEG 12/22: No seizures  Subjective: alert, not very verbally communicative but responding with gesture Assessment / Plan / Recommendation CHL IP CLINICAL IMPRESSIONS 05/04/2020 Clinical Impression Pt's presentation appears similar to that noted during the last MBS on 12/24, but with improved laryngeal sensation. He presented with oorpharyngeal dysphagia characterized by reduced posterior bolus propulsion, lingual retraction, and reduced anterior laryngeal moment. He exhibited vallecular residue, pyriform sinus residue, incomplete epiglottic inversion, reduced laryngeal vestibule closure, and an inconsistent pharyngeal delay. Aspiration (PAS 7) was inconsistently noted with full-tsp boluses nectar thick liquids and more consistently with full-tsp boluses of honey thick liquids. Aspiration was secondary to impaired timing of the swallow or aspiration of pyriform sinus residue during secondary swallows. All episodes of aspiration resulted in coughing, but cough was weak and ineffective. Amount of residue was improved with a left head turn posture and frequency of aspiration  reduced. However, pt exhibited difficulty demonstrating with full lateralization to the left and penetration (PAS 3) was noted when his head was close to midline. It is recommended that nonoral alimentation be continued. Use of multiple precautions would still be necessary to ensure safety of p.o. intake including use of 1/2 tsp boluses of liquids and a left head turn. SLP anticipates that pt will have difficulty consistently using all of these and his risk of aspiration is moderate to high without their observace. SLP Visit Diagnosis Dysphagia, oropharyngeal phase (R13.12) Attention and concentration deficit following -- Frontal lobe and executive function deficit following -- Impact on safety and function Moderate aspiration risk;Risk for inadequate nutrition/hydration   CHL IP TREATMENT RECOMMENDATION 05/04/2020 Treatment Recommendations Therapy as outlined in treatment plan below;F/U MBS in --- days (Comment)   Prognosis 05/04/2020 Prognosis for Safe Diet Advancement Good Barriers to Reach Goals Severity of  deficits Barriers/Prognosis Comment -- CHL IP DIET RECOMMENDATION 05/04/2020 SLP Diet Recommendations Other (Comment);Alternative means - long-term Liquid Administration via -- Medication Administration Via alternative means Compensations Slow rate;Small sips/bites;Follow solids with liquid Postural Changes --   CHL IP OTHER RECOMMENDATIONS 05/04/2020 Recommended Consults -- Oral Care Recommendations Oral care QID Other Recommendations --   CHL IP FOLLOW UP RECOMMENDATIONS 05/04/2020 Follow up Recommendations Inpatient Rehab   CHL IP FREQUENCY AND DURATION 05/04/2020 Speech Therapy Frequency (ACUTE ONLY) min 2x/week Treatment Duration 2 weeks      CHL IP ORAL PHASE 05/04/2020 Oral Phase Impaired Oral - Pudding Teaspoon -- Oral - Pudding Cup -- Oral - Honey Teaspoon Reduced posterior propulsion Oral - Honey Cup Reduced posterior propulsion Oral - Nectar Teaspoon Reduced posterior propulsion Oral - Nectar Cup  Reduced posterior propulsion Oral - Nectar Straw Reduced posterior propulsion Oral - Thin Teaspoon -- Oral - Thin Cup -- Oral - Thin Straw Reduced posterior propulsion;Lingual pumping Oral - Puree Reduced posterior propulsion;Lingual pumping Oral - Mech Soft -- Oral - Regular -- Oral - Multi-Consistency -- Oral - Pill -- Oral Phase - Comment --  CHL IP PHARYNGEAL PHASE 05/04/2020 Pharyngeal Phase Impaired Pharyngeal- Pudding Teaspoon -- Pharyngeal -- Pharyngeal- Pudding Cup -- Pharyngeal -- Pharyngeal- Honey Teaspoon Penetration/Aspiration before swallow;Penetration/Aspiration during swallow;Pharyngeal residue - valleculae;Pharyngeal residue - pyriform;Reduced airway/laryngeal closure;Reduced tongue base retraction;Reduced anterior laryngeal mobility Pharyngeal Material enters airway, passes BELOW cords and not ejected out despite cough attempt by patient;Material enters airway, remains ABOVE vocal cords and not ejected out Pharyngeal- Honey Cup Penetration/Aspiration before swallow;Penetration/Aspiration during swallow;Pharyngeal residue - valleculae;Pharyngeal residue - pyriform;Reduced airway/laryngeal closure;Reduced tongue base retraction;Reduced anterior laryngeal mobility Pharyngeal Material enters airway, passes BELOW cords and not ejected out despite cough attempt by patient Pharyngeal- Nectar Teaspoon Penetration/Aspiration before swallow;Penetration/Aspiration during swallow;Pharyngeal residue - valleculae;Pharyngeal residue - pyriform;Reduced airway/laryngeal closure;Reduced tongue base retraction;Reduced anterior laryngeal mobility Pharyngeal Material enters airway, passes BELOW cords and not ejected out despite cough attempt by patient Pharyngeal- Nectar Cup Penetration/Aspiration before swallow;Penetration/Aspiration during swallow;Pharyngeal residue - valleculae;Pharyngeal residue - pyriform;Reduced airway/laryngeal closure;Reduced tongue base retraction;Reduced anterior laryngeal mobility Pharyngeal  Material enters airway, passes BELOW cords and not ejected out despite cough attempt by patient Pharyngeal- Nectar Straw -- Pharyngeal -- Pharyngeal- Thin Teaspoon -- Pharyngeal -- Pharyngeal- Thin Cup -- Pharyngeal -- Pharyngeal- Thin Straw NT Pharyngeal -- Pharyngeal- Puree Pharyngeal residue - valleculae;Pharyngeal residue - pyriform;Reduced airway/laryngeal closure;Reduced anterior laryngeal mobility;Reduced tongue base retraction Pharyngeal -- Pharyngeal- Mechanical Soft -- Pharyngeal -- Pharyngeal- Regular -- Pharyngeal -- Pharyngeal- Multi-consistency -- Pharyngeal -- Pharyngeal- Pill -- Pharyngeal -- Pharyngeal Comment --  CHL IP CERVICAL ESOPHAGEAL PHASE 05/04/2020 Cervical Esophageal Phase WFL Pudding Teaspoon -- Pudding Cup -- Honey Teaspoon -- Honey Cup -- Nectar Teaspoon -- Nectar Cup -- Nectar Straw -- Thin Teaspoon -- Thin Cup -- Thin Straw -- Puree -- Mechanical Soft -- Regular -- Multi-consistency -- Pill -- Cervical Esophageal Comment -- Shanika I. Hardin Negus, [place], [place] Office number [phone] Pager [place] 05/04/2020, 10:23 AM              EEG adult  Result Date: 05/05/2020 Lora Havens, MD     05/05/2020  1:47 PM Patient Name: John Lambert MRN: [id number] Epilepsy Attending: Lora Havens Referring Physician/Provider: Dr Rosalin Hawking Date: 05/05/2020 Duration: 24.53 mins Patient history: 80yo M with left side stroke. EEG to evaluate for seizure Level of alertness: Awake, asleep AEDs during EEG study: None Technical aspects: This EEG study  was done with scalp electrodes positioned according to the 10-20 International system of electrode placement. Electrical activity was acquired at a sampling rate of 500Hz  and reviewed with a high frequency filter of 70Hz  and a low frequency filter of 1Hz . EEG data were recorded continuously and digitally stored. Description: The posterior dominant rhythm consists of 8 Hz activity of moderate  voltage (25-35 uV) seen predominantly in posterior head regions, symmetric and reactive to eye opening and eye closing.Sleep was characterized by vertex waves, maximal frontocentral region EEG showed intermittent 3-5hz  theta-delta slowing in left hemisphere, maximal left frontotemporal region. Hyperventilation and photic stimulation were not performed.  ABNORMALITY -Intermiitent slow, left hemisphere, maximal left frontotemporal region  IMPRESSION: This study is suggestive of cortical dysfunction in left hemisphere, maximal left fronto-temporal region likely secondary to underlying stroke/structural abnormality. No seizures or epileptiform discharges were seen throughout the recording. EEG appears to be improving to previous study.  Lora Havens   EEG adult  Result Date: 04/27/2020 Lora Havens, MD     05/05/2020  1:38 PM Patient Name: John Lambert MRN: [id number] Epilepsy Attending: Lora Havens Referring Physician/Provider: Dr Antony Contras Date: 04/27/2020 Duration: 25.57 mins Patient history: 80 year old Caucasian male with sudden onset of expressive aphasia left gaze deviation and right hemiplegia. EEG to evaluate for seizure Level of alertness: Awake AEDs during EEG study: None Technical aspects: This EEG study was done with scalp electrodes positioned according to the 10-20 International system of electrode placement. Electrical activity was acquired at a sampling rate of 500Hz  and reviewed with a high frequency filter of 70Hz  and a low frequency filter of 1Hz . EEG data were recorded continuously and digitally stored. Description: The posterior dominant rhythm consists of 8 Hz activity of moderate voltage (25-35 uV) seen predominantly in posterior head regions, symmetric and reactive to eye opening and eye closing. EEG showed continuous generalized 3 to 6 Hz theta-delta slowing in left hemisphere, maximal left frontotemporal region, which at times appears sharply contoured.  Hyperventilation and photic stimulation were not performed. ABNORMALITY -Continuous slow, left hemisphere, maximal left frontotemporal region IMPRESSION: This study is suggestive of cortical dysfunction in left hemisphere, maximal left fronto-temporal region likely secondary to underlying stroke/structural abnormality. No seizures or epileptiform discharges were seen throughout the recording. Lora Havens   CT HEAD CODE STROKE WO CONTRAST  Result Date: 04/27/2020 CLINICAL DATA:  Code stroke.  Right-sided gaze.  Flaccid right-sided EXAM: CT HEAD WITHOUT CONTRAST TECHNIQUE: Contiguous axial images were obtained from the base of the skull through the vertex without intravenous contrast. COMPARISON:  None. FINDINGS: Brain: Small remote cortical infarcts in the posterior right frontal and left frontal parietal region at the vertex. Subacute infarcts in the left frontal lobe by prior brain MRI are in apparent on this study. Chronic small vessel disease with chronic appearing lacune. At the bilateral caudate nuclei. Confluent chronic small vessel ischemia in the cerebral white matter. No hemorrhage, hydrocephalus, or masslike finding. Cerebral atrophy. Vascular: Prominent density of the supraclinoid left ICA on axial slices, but not persisting on reformats. A CTA is already ordered. Atherosclerosis Skull: Normal. Negative for fracture or focal lesion. Sinuses/Orbits: Retention cyst in the left maxillary sinus. Other: These results were communicated to Dr. Leonie Man at 10:17 [place] 12/22/2021by text page via the [facility] messaging system. ASPECTS [facility] Stroke Program Early CT Score) - Ganglionic level infarction (caudate, lentiform nuclei, internal capsule, insula, M1-M3 cortex): 7 - Supraganglionic infarction (M4-M6 cortex): 3 (when accounting for chronic infarction) Total score (0-10  with 10 being normal): 10 IMPRESSION: 1. No hemorrhage or visible acute infarct. 2. Subacute infarcts in the left frontal lobe by recent  brain MRI are not visible today. 3. Small remote cortical infarcts.  Chronic small vessel disease. Electronically Signed   By: Monte Fantasia M.D.   On: 04/27/2020 10:20   Structural Heart Procedure  Result Date: 04/27/2020 See surgical note for result.  US Abdomen Limited RUQ (LIVER/GB)  Result Date: 05/07/2020 CLINICAL DATA:  Abnormal liver function tests. EXAM: ULTRASOUND ABDOMEN LIMITED RIGHT UPPER QUADRANT COMPARISON:  May 04, 2020. FINDINGS: Gallbladder: No gallstones or wall thickening visualized. No sonographic Murphy sign noted by sonographer. Common bile duct: Diameter: 3 mm which is within normal limits. Liver: No focal lesion identified. Within normal limits in parenchymal echogenicity. Portal vein is patent on color Doppler imaging with normal direction of blood flow towards the liver. Other: None. IMPRESSION: No definite abnormality seen in the right upper quadrant of the abdomen. Electronically Signed   By: Marijo Conception M.D.   On: 05/07/2020 18:04   IR ANGIO INTRA EXTRACRAN SEL COM CAROTID INNOMINATE UNI L MOD SED  Result Date: 04/28/2020 CLINICAL DATA:  Onset of left-sided gaze deviation and right-sided weakness arm greater than leg with speech difficulties post surgery. EXAM: IR ANGIO INTRA EXTRACRAN SEL COM CAROTID INNOMINATE UNI LEFT MOD SED COMPARISON:  CT angiogram of the head and neck of April 27, 2020, and April 20, 2020. MEDICATIONS: Heparin 1000 units IV. No antibiotic was administered within 1 hour of the procedure. ANESTHESIA/SEDATION: General anesthesia. CONTRAST:  Isovue 300 approximately 45 mL FLUOROSCOPY TIME:  Fluoroscopy Time: 4 minutes 18 seconds (427.1 mGy). COMPLICATIONS: None immediate. TECHNIQUE: Informed written consent was obtained from the patient after a thorough discussion of the procedural risks, benefits and alternatives. All questions were addressed. Maximal Sterile Barrier Technique was utilized including caps, mask, sterile gowns, sterile  gloves, sterile drape, hand hygiene and skin antiseptic. A timeout was performed prior to the initiation of the procedure. The right groin was prepped and draped in the usual sterile fashion. Thereafter using modified Seldinger technique, transfemoral access into the right common femoral artery was obtained without difficulty. Over a 0.035 inch guidewire, a 5 French Pinnacle sheath was inserted. Through this, and also over 0.035 inch guidewire, a 5 [place] JB 1 catheter was advanced to the aortic arch region and selectively positioned in left common carotid artery. FINDINGS:.: FINDINGS:. The left common carotid arteriogram demonstrates the left external carotid artery and its major branches to be widely patent. The stent is seen positioned in the proximal left internal carotid artery extending into the distal left common carotid artery. The proximal portion of the stent at the level of the carotid bifurcation demonstrates step-off anteriorly and posteriorly suggestive of partial fracture. There is approximately 30% to 40% narrowing in the proximal portion of the stented segment of the ICA by the NASCET criteria, on the AP projection. More distally the vessel is seen to opacify to the cranial skull base. The petrous, the cavernous and the supraclinoid segments are widely patent. The left middle cerebral artery and the left anterior cerebral artery opacify into the capillary and venous phases. No angiographic evidence of intraluminal filling defects or of occlusions are seen into the more tertiary branches of the left MCA distribution. Also seen is prompt opacification via the anterior communicating artery of the right anterior cerebral A2 segment and distally. IMPRESSION: Approximately 30-40% stenosis within the stented segment of the left ICA proximally on  the AP projection. Intracranially no evidence of intraluminal filling defects, occlusions, stenosis or of dissections. PLAN: Patient was extubated successfully and  returned to the PACU. Electronically Signed   By: Luanne Bras M.D.   On: 04/27/2020 14:15       Subjective: Patient seen and examined.  Sleeping comfortably on the bed.  Remained afebrile.  No acute events overnight.  Discharge Exam: Vitals:   05/24/2020 0439 05/26/2020 0800  BP: 102/62 101/63  Pulse: 92 96  Resp: 20 16  Temp: 98.4 F (36.9 C) 98.2 F (36.8 C)  SpO2: 96% 96%   Vitals:   05/24/20 2021 05/16/2020 0005 05/10/2020 0439 06/03/2020 0800  BP: 115/86 100/60 102/62 101/63  Pulse: 82 82 92 96  Resp: 18 16 20 16   Temp: 98.6 F (37 C) 98.6 F (37 C) 98.4 F (36.9 C) 98.2 F (36.8 C)  TempSrc: Oral  Oral Oral  SpO2: 97% 97% 96% 96%  Weight:      Height:        General: Pt is alert, awake, not in acute distress, on room air, sleeping comfortably. Cardiovascular: RRR, S1/S2 +, no rubs, no gallops Respiratory: CTA bilaterally, no wheezing, no rhonchi Abdominal: Soft, NT, ND, bowel sounds + Extremities: no edema, no cyanosis    The results of significant diagnostics from this hospitalization (including imaging, microbiology, ancillary and laboratory) are listed below for reference.     Microbiology: No results found for this or any previous visit (from the past 240 hour(s)).   Labs: BNP (last 3 results) No results for input(s): BNP in the last 8760 hours. Basic Metabolic Panel: Recent Labs  Lab 05/19/20 0408 05/21/20 0328  NA 135 135  K 4.6 4.2  CL 98 97*  CO2 26 29  GLUCOSE 118* 113*  BUN 28* 33*  CREATININE 0.76 0.75  CALCIUM 9.0 8.6*   Liver Function Tests: Recent Labs  Lab 05/19/20 0408  AST 51*  ALT 61*  ALKPHOS 52  BILITOT 1.1  PROT 6.8  ALBUMIN 3.2*   No results for input(s): LIPASE, AMYLASE in the last 168 hours. No results for input(s): AMMONIA in the last 168 hours. CBC: Recent Labs  Lab 05/19/20 0408 05/21/20 0328 05/21/2020 0749  WBC 9.1 6.2 5.3  HGB 14.3 12.9* 12.3*  HCT 42.5 41.3 39.4  MCV 91.0 93.7 95.4  PLT 357 273  231   Cardiac Enzymes: No results for input(s): CKTOTAL, CKMB, CKMBINDEX, TROPONINI in the last 168 hours. BNP: Invalid input(s): POCBNP CBG: Recent Labs  Lab 05/24/20 2018 05/12/2020 0004 05/31/2020 0438 06/02/2020 0748 05/11/2020 0818  GLUCAP 120* 105* 109* 131* 123*   D-Dimer No results for input(s): DDIMER in the last 72 hours. Hgb A1c No results for input(s): HGBA1C in the last 72 hours. Lipid Profile No results for input(s): CHOL, HDL, LDLCALC, TRIG, CHOLHDL, LDLDIRECT in the last 72 hours. Thyroid function studies No results for input(s): TSH, T4TOTAL, T3FREE, THYROIDAB in the last 72 hours.  Invalid input(s): FREET3 Anemia work up No results for input(s): VITAMINB12, FOLATE, FERRITIN, TIBC, IRON, RETICCTPCT in the last 72 hours. Urinalysis    Component Value Date/Time   COLORURINE YELLOW 05/04/2020 1143   APPEARANCEUR CLOUDY (A) 05/04/2020 1143   LABSPEC 1.015 05/04/2020 1143   PHURINE 5.0 05/04/2020 1143   GLUCOSEU NEGATIVE 05/04/2020 1143   HGBUR LARGE (A) 05/04/2020 1143   BILIRUBINUR NEGATIVE 05/04/2020 1143   KETONESUR NEGATIVE 05/04/2020 1143   PROTEINUR 100 (A) 05/04/2020 1143   NITRITE NEGATIVE 05/04/2020  [place] (A) 05/04/2020 1143   Sepsis Labs Invalid input(s): PROCALCITONIN,  WBC,  LACTICIDVEN Microbiology No results found for this or any previous visit (from the past 240 hour(s)).   Time coordinating discharge: Over 30 minutes  SIGNED:   Mckinley Jewel, MD  [facility] 05/23/2020, 2:32 PM Pager   If 7PM-7AM, please contact night-coverage [email or web address]

## 2020-05-25 NOTE — TOC Transition Note (Signed)
Transition of Care [facility]) - CM/SW Discharge Note   Patient Details  Name: DONN ZANETTI MRN: [id number] Date of Birth: October 06, 1940  Transition of Care [facility]) CM/SW Contact:  Geralynn Ochs, LCSW Phone Number: 06-02-20, 2:41 PM   Clinical Narrative:   Nurse to call report to [phone], Room 805.   RN please call daughter, Durenda Guthrie [phone], when transport is here to pick up the patient.     Final next level of care: Skilled Nursing Facility Barriers to Discharge: Barriers Resolved   Patient Goals and CMS Choice Patient states their goals for this hospitalization and ongoing recovery are:: Rehab CMS Medicare.gov Compare Post Acute Care list provided to:: Patient Choice offered to / list presented to : [place]  Discharge Placement              Patient chooses bed at: [name] Patient to be transferred to facility by: [place] Name of family member notified: [facility] Patient and family notified of of transfer: 02-Jun-2020  Discharge Plan and Services In-house Referral: Clinical Social Work Discharge Planning Services: [employer] Consult Post Acute Care Choice: [place]                               Social Determinants of Health (SDOH) Interventions     Readmission Risk Interventions No flowsheet data found.

## 2020-05-25 NOTE — Progress Notes (Signed)
OT Cancellation Note  Patient Details Name: John Lambert MRN: [id number] DOB: 1940-07-27   Cancelled Treatment:    Reason Eval/Treat Not Completed: Pain limiting ability to participate;Fatigue/lethargy limiting ability to participate;Other (comment) pt declined OT session, will check back as time allows.   [name], COTA/L Acute Rehabilitation Services [phone]   [name] 05/14/2020, 2:24 PM

## 2020-05-26 DIAGNOSIS — Z7901 Long term (current) use of anticoagulants: Secondary | ICD-10-CM | POA: Diagnosis not present

## 2020-05-27 DIAGNOSIS — I639 Cerebral infarction, unspecified: Secondary | ICD-10-CM | POA: Diagnosis not present

## 2020-05-27 DIAGNOSIS — R1310 Dysphagia, unspecified: Secondary | ICD-10-CM | POA: Diagnosis not present

## 2020-05-27 DIAGNOSIS — N390 Urinary tract infection, site not specified: Secondary | ICD-10-CM | POA: Diagnosis not present

## 2020-05-27 DIAGNOSIS — U071 COVID-19: Secondary | ICD-10-CM | POA: Diagnosis not present

## 2020-05-28 DIAGNOSIS — R791 Abnormal coagulation profile: Secondary | ICD-10-CM | POA: Diagnosis not present

## 2020-05-28 DIAGNOSIS — I82409 Acute embolism and thrombosis of unspecified deep veins of unspecified lower extremity: Secondary | ICD-10-CM | POA: Diagnosis not present

## 2020-06-03 DIAGNOSIS — R791 Abnormal coagulation profile: Secondary | ICD-10-CM | POA: Diagnosis not present

## 2020-06-03 DIAGNOSIS — I639 Cerebral infarction, unspecified: Secondary | ICD-10-CM | POA: Diagnosis not present

## 2020-06-03 DIAGNOSIS — R1310 Dysphagia, unspecified: Secondary | ICD-10-CM | POA: Diagnosis not present

## 2020-06-03 DIAGNOSIS — I82409 Acute embolism and thrombosis of unspecified deep veins of unspecified lower extremity: Secondary | ICD-10-CM | POA: Diagnosis not present

## 2020-06-03 DIAGNOSIS — R059 Cough, unspecified: Secondary | ICD-10-CM | POA: Diagnosis not present

## 2020-06-06 ENCOUNTER — Encounter (HOSPITAL_COMMUNITY): Payer: Medicare Other

## 2020-06-06 ENCOUNTER — Encounter: Payer: Medicare Other | Admitting: Surgery

## 2020-06-07 DIAGNOSIS — 419620001 Death: Secondary | SNOMED CT | POA: Diagnosis not present

## 2020-06-07 DIAGNOSIS — R791 Abnormal coagulation profile: Secondary | ICD-10-CM | POA: Diagnosis not present

## 2020-06-07 DEATH — deceased

## 2020-06-08 ENCOUNTER — Telehealth: Payer: Self-pay

## 2020-06-08 NOTE — Telephone Encounter (Signed)
SNF called to ask about Lovenox and coumadin being given concurrently. Not given by our MDs, but per hospitalist note on 05/24/20 - patient's INR to be checked every 2-3 days and Lovenox stopped when INR is therapeutic. SNF says INR is 9. Advised that INR is not therapeutic - it is way too high and that per the note - it stands to reason to stop the Lovenox. Advised her to recheck INR and not give any more Lovenox. She says she will check with the facility's NP.

## 2020-06-09 DIAGNOSIS — R791 Abnormal coagulation profile: Secondary | ICD-10-CM | POA: Diagnosis not present

## 2020-06-09 DIAGNOSIS — I10 Essential (primary) hypertension: Secondary | ICD-10-CM | POA: Diagnosis not present

## 2020-06-09 DIAGNOSIS — I639 Cerebral infarction, unspecified: Secondary | ICD-10-CM | POA: Diagnosis not present

## 2020-06-10 DIAGNOSIS — R791 Abnormal coagulation profile: Secondary | ICD-10-CM | POA: Diagnosis not present

## 2020-06-11 DIAGNOSIS — I82409 Acute embolism and thrombosis of unspecified deep veins of unspecified lower extremity: Secondary | ICD-10-CM | POA: Diagnosis not present

## 2020-06-11 DIAGNOSIS — R791 Abnormal coagulation profile: Secondary | ICD-10-CM | POA: Diagnosis not present

## 2020-06-13 DIAGNOSIS — I480 Paroxysmal atrial fibrillation: Secondary | ICD-10-CM | POA: Diagnosis not present

## 2020-06-13 DIAGNOSIS — R791 Abnormal coagulation profile: Secondary | ICD-10-CM | POA: Diagnosis not present

## 2020-06-15 DIAGNOSIS — R059 Cough, unspecified: Secondary | ICD-10-CM | POA: Diagnosis not present

## 2020-06-15 DIAGNOSIS — R0989 Other specified symptoms and signs involving the circulatory and respiratory systems: Secondary | ICD-10-CM | POA: Diagnosis not present

## 2020-06-15 DIAGNOSIS — R489 Unspecified symbolic dysfunctions: Secondary | ICD-10-CM | POA: Diagnosis not present

## 2020-06-15 DIAGNOSIS — R791 Abnormal coagulation profile: Secondary | ICD-10-CM | POA: Diagnosis not present

## 2020-06-15 DIAGNOSIS — R1319 Other dysphagia: Secondary | ICD-10-CM | POA: Diagnosis not present

## 2020-06-15 DIAGNOSIS — I69959 Hemiplegia and hemiparesis following unspecified cerebrovascular disease affecting unspecified side: Secondary | ICD-10-CM | POA: Diagnosis not present

## 2020-06-15 DIAGNOSIS — Z7901 Long term (current) use of anticoagulants: Secondary | ICD-10-CM | POA: Diagnosis not present

## 2020-06-17 ENCOUNTER — Other Ambulatory Visit: Payer: Self-pay

## 2020-06-17 DIAGNOSIS — R269 Unspecified abnormalities of gait and mobility: Secondary | ICD-10-CM | POA: Insufficient documentation

## 2020-06-17 DIAGNOSIS — F331 Major depressive disorder, recurrent, moderate: Secondary | ICD-10-CM | POA: Insufficient documentation

## 2020-06-17 DIAGNOSIS — R2689 Other abnormalities of gait and mobility: Secondary | ICD-10-CM | POA: Insufficient documentation

## 2020-06-17 DIAGNOSIS — Z86711 Personal history of pulmonary embolism: Secondary | ICD-10-CM | POA: Insufficient documentation

## 2020-06-17 DIAGNOSIS — E785 Hyperlipidemia, unspecified: Secondary | ICD-10-CM | POA: Insufficient documentation

## 2020-06-17 DIAGNOSIS — Z7901 Long term (current) use of anticoagulants: Secondary | ICD-10-CM | POA: Insufficient documentation

## 2020-06-17 DIAGNOSIS — I8291 Chronic embolism and thrombosis of unspecified vein: Secondary | ICD-10-CM | POA: Insufficient documentation

## 2020-06-17 DIAGNOSIS — I6529 Occlusion and stenosis of unspecified carotid artery: Secondary | ICD-10-CM

## 2020-06-17 DIAGNOSIS — M40209 Unspecified kyphosis, site unspecified: Secondary | ICD-10-CM | POA: Insufficient documentation

## 2020-06-17 DIAGNOSIS — E7800 Pure hypercholesterolemia, unspecified: Secondary | ICD-10-CM | POA: Insufficient documentation

## 2020-06-17 DIAGNOSIS — Z85820 Personal history of malignant melanoma of skin: Secondary | ICD-10-CM | POA: Insufficient documentation

## 2020-06-19 DIAGNOSIS — Z7901 Long term (current) use of anticoagulants: Secondary | ICD-10-CM | POA: Diagnosis not present

## 2020-06-20 DIAGNOSIS — I63132 Cerebral infarction due to embolism of left carotid artery: Secondary | ICD-10-CM | POA: Diagnosis not present

## 2020-06-23 DIAGNOSIS — Z7901 Long term (current) use of anticoagulants: Secondary | ICD-10-CM | POA: Diagnosis not present

## 2020-06-23 DIAGNOSIS — I480 Paroxysmal atrial fibrillation: Secondary | ICD-10-CM | POA: Diagnosis not present

## 2020-06-24 DIAGNOSIS — R791 Abnormal coagulation profile: Secondary | ICD-10-CM | POA: Diagnosis not present

## 2020-06-24 DIAGNOSIS — Z7901 Long term (current) use of anticoagulants: Secondary | ICD-10-CM | POA: Diagnosis not present

## 2020-06-24 DIAGNOSIS — I82409 Acute embolism and thrombosis of unspecified deep veins of unspecified lower extremity: Secondary | ICD-10-CM | POA: Diagnosis not present

## 2020-06-24 DIAGNOSIS — I10 Essential (primary) hypertension: Secondary | ICD-10-CM | POA: Diagnosis not present

## 2020-06-24 DIAGNOSIS — N390 Urinary tract infection, site not specified: Secondary | ICD-10-CM | POA: Diagnosis not present

## 2020-06-30 DIAGNOSIS — M6281 Muscle weakness (generalized): Secondary | ICD-10-CM | POA: Diagnosis not present

## 2020-06-30 DIAGNOSIS — R1312 Dysphagia, oropharyngeal phase: Secondary | ICD-10-CM | POA: Diagnosis not present

## 2020-06-30 DIAGNOSIS — I69320 Aphasia following cerebral infarction: Secondary | ICD-10-CM | POA: Diagnosis not present

## 2020-06-30 DIAGNOSIS — I63132 Cerebral infarction due to embolism of left carotid artery: Secondary | ICD-10-CM | POA: Diagnosis not present

## 2020-06-30 DIAGNOSIS — R278 Other lack of coordination: Secondary | ICD-10-CM | POA: Diagnosis not present

## 2020-06-30 DIAGNOSIS — I69391 Dysphagia following cerebral infarction: Secondary | ICD-10-CM | POA: Diagnosis not present

## 2020-06-30 DIAGNOSIS — I69351 Hemiplegia and hemiparesis following cerebral infarction affecting right dominant side: Secondary | ICD-10-CM | POA: Diagnosis not present

## 2020-06-30 DIAGNOSIS — N179 Acute kidney failure, unspecified: Secondary | ICD-10-CM | POA: Diagnosis not present

## 2020-06-30 DIAGNOSIS — I6602 Occlusion and stenosis of left middle cerebral artery: Secondary | ICD-10-CM | POA: Diagnosis not present

## 2020-06-30 DIAGNOSIS — I480 Paroxysmal atrial fibrillation: Secondary | ICD-10-CM | POA: Diagnosis not present

## 2020-07-01 DIAGNOSIS — R310 Gross hematuria: Secondary | ICD-10-CM | POA: Diagnosis not present

## 2020-07-01 DIAGNOSIS — R338 Other retention of urine: Secondary | ICD-10-CM | POA: Diagnosis not present

## 2020-07-01 DIAGNOSIS — Z7901 Long term (current) use of anticoagulants: Secondary | ICD-10-CM | POA: Diagnosis not present

## 2020-07-02 DIAGNOSIS — J9690 Respiratory failure, unspecified, unspecified whether with hypoxia or hypercapnia: Secondary | ICD-10-CM | POA: Diagnosis not present

## 2020-07-04 ENCOUNTER — Encounter: Payer: Medicare Other | Admitting: Surgery

## 2020-07-04 ENCOUNTER — Ambulatory Visit (HOSPITAL_COMMUNITY): Payer: Medicare Other

## 2020-07-04 ENCOUNTER — Telehealth: Payer: Self-pay | Admitting: Neurology

## 2020-07-05 DIAGNOSIS — 419620001 Death: Secondary | SNOMED CT | POA: Diagnosis not present

## 2020-07-05 NOTE — Telephone Encounter (Signed)
Pt.'s daughter Durenda Guthrie called to let us know that her father has passed.

## 2020-07-05 NOTE — Telephone Encounter (Signed)
Events noted.  No evidence that this patient was seen through this office previously.

## 2020-07-05 DEATH — deceased

## 2020-07-06 ENCOUNTER — Inpatient Hospital Stay: Payer: Self-pay | Admitting: Adult Health

## 2020-08-01 ENCOUNTER — Encounter: Payer: Medicare Other | Admitting: Surgery

## 2020-08-01 ENCOUNTER — Encounter (HOSPITAL_COMMUNITY): Payer: Medicare Other
